# Patient Record
Sex: Female | Born: 1965 | Race: White | Hispanic: No | Smoking: Never smoker
Health system: Southern US, Community
[De-identification: ages and names within clinical notes are randomized; demographics above are authoritative.]

## PROBLEM LIST (undated history)

## (undated) DIAGNOSIS — I10 Essential (primary) hypertension: Secondary | ICD-10-CM

## (undated) DIAGNOSIS — G43909 Migraine, unspecified, not intractable, without status migrainosus: Secondary | ICD-10-CM

## (undated) DIAGNOSIS — I509 Heart failure, unspecified: Secondary | ICD-10-CM

## (undated) DIAGNOSIS — I639 Cerebral infarction, unspecified: Secondary | ICD-10-CM

## (undated) HISTORY — PX: WISDOM TOOTH EXTRACTION: SHX21

## (undated) HISTORY — DX: Cerebral infarction, unspecified: I63.9

---

## 2001-03-06 ENCOUNTER — Other Ambulatory Visit: Admission: RE | Admit: 2001-03-06 | Discharge: 2001-03-06 | Payer: Self-pay | Admitting: *Deleted

## 2002-10-05 ENCOUNTER — Emergency Department (HOSPITAL_COMMUNITY): Admission: EM | Admit: 2002-10-05 | Discharge: 2002-10-05 | Payer: Self-pay | Admitting: Emergency Medicine

## 2002-10-05 ENCOUNTER — Encounter: Payer: Self-pay | Admitting: Emergency Medicine

## 2012-01-07 ENCOUNTER — Emergency Department (HOSPITAL_BASED_OUTPATIENT_CLINIC_OR_DEPARTMENT_OTHER)
Admission: EM | Admit: 2012-01-07 | Discharge: 2012-01-07 | Disposition: A | Discharge status: Home / Self Care | Payer: BC Managed Care – PPO | Attending: Emergency Medicine | Admitting: Emergency Medicine

## 2012-01-07 ENCOUNTER — Encounter (HOSPITAL_BASED_OUTPATIENT_CLINIC_OR_DEPARTMENT_OTHER): Payer: Self-pay | Admitting: *Deleted

## 2012-01-07 DIAGNOSIS — R51 Headache: Secondary | ICD-10-CM | POA: Insufficient documentation

## 2012-01-07 HISTORY — DX: Migraine, unspecified, not intractable, without status migrainosus: G43.909

## 2012-01-07 HISTORY — DX: Essential (primary) hypertension: I10

## 2012-01-07 MED ORDER — ONDANSETRON 8 MG PO TBDP
8.0000 mg | ORAL_TABLET | Freq: Once | ORAL | Status: AC
  Administered 2012-01-07: 8 mg via ORAL
  Filled 2012-01-07: qty 1

## 2012-01-07 MED ORDER — KETOROLAC TROMETHAMINE 30 MG/ML IJ SOLN
30.0000 mg | Freq: Once | INTRAMUSCULAR | Status: AC
  Administered 2012-01-07: 30 mg via INTRAMUSCULAR
  Filled 2012-01-07: qty 1

## 2012-01-07 MED ORDER — IBUPROFEN 600 MG PO TABS: 600.0000 mg | ORAL_TABLET | Freq: Four times a day (QID) | ORAL | Status: AC | PRN

## 2012-01-07 MED ORDER — HYDROCODONE-ACETAMINOPHEN 5-500 MG PO TABS: 1.0000 | ORAL_TABLET | Freq: Four times a day (QID) | ORAL | Status: AC | PRN

## 2012-01-07 NOTE — ED Notes (Signed)
Pt reports developing a headache around 2pm yesterday afternoon. States headache turned into a migraine late last night. States that she has a history of migraines but has not had one for over a year. States that headache feels like a "clamp around her head". States that she took Excedrin Migraine medicine approximately 4 hours ago without relief. Pt reports nausea but no vomiting.

## 2012-01-07 NOTE — ED Provider Notes (Signed)
History     CSN: [id number]  Arrival date & time 01/07/12  0422   First MD Initiated Contact with Patient 01/07/12 [phone]      Chief Complaint  Patient presents with  . Migraine  . Nausea    (Consider location/radiation/quality/duration/timing/severity/associated sxs/prior treatment) Patient is a 46 y.o. female presenting with migraine. The history is provided by the patient.  Migraine Associated symptoms include headaches. Pertinent negatives include no chest pain, no abdominal pain and no shortness of breath.  pt states gradual onset 'migraine' headache yesterday. Onset at rest, awake, not straining or exerting. Mild at onset. Dull. Slowly worse and now states more throbbing, bilateral. Denies acute or abrupt worsening since onset. No neck pain or stiffness. Mild photophobia. No eye pain or change in vision. Mild nausea. No vomiting. Feels same as prior migraine. Not a 'worst' headache. Denies allergies or sinus symptoms. No uri c/o. No fever or chills. No recent fall, head injury or fainting. Denies any problems w balance or coordination. No numbness/weakness.   History reviewed. No pertinent past medical history.  History reviewed. No pertinent past surgical history.  No family history on file.  History  Substance Use Topics  . Smoking status: Not on file  . Smokeless tobacco: Not on file  . Alcohol Use: Not on file    OB History    Grav Para Term Preterm Abortions TAB SAB Ect Mult Living                  Review of Systems  Constitutional: Negative for fever and chills.  HENT: Negative for neck pain and neck stiffness.   Eyes: Negative for pain, redness and visual disturbance.  Respiratory: Negative for shortness of breath.   Cardiovascular: Negative for chest pain.  Gastrointestinal: Negative for vomiting and abdominal pain.  Genitourinary: Negative for flank pain.  Musculoskeletal: Negative for back pain.  Skin: Negative for rash.  Neurological: Positive for  headaches. Negative for syncope, weakness, light-headedness and numbness.  Hematological: Does not bruise/bleed easily.  Psychiatric/Behavioral: Negative for confusion.    Allergies  Review of patient's allergies indicates not on file.  Home Medications  No current outpatient prescriptions on file.  BP 151/107  Pulse 110  Temp(Src) 98.1 F (36.7 C) (Oral)  Resp 16  SpO2 99%  Physical Exam  Nursing note and vitals reviewed. Constitutional: She is oriented to person, place, and time. She appears well-developed and well-nourished. No distress.  HENT:  Head: Atraumatic.  Nose: Nose normal.  Mouth/Throat: Oropharynx is clear and moist.       No sinus or temporal tenderness  Eyes: Conjunctivae and EOM are normal. Pupils are equal, round, and reactive to light. No scleral icterus.       Sharp discs, no pe noted.   Neck: Normal range of motion. Neck supple. No tracheal deviation present.       No stiffness or rigidity.   Cardiovascular: Normal rate, regular rhythm, normal heart sounds and intact distal pulses.  Exam reveals no friction rub.   No murmur heard. Pulmonary/Chest: Effort normal and breath sounds normal. No respiratory distress.  Abdominal: Soft. Normal appearance and bowel sounds are normal. She exhibits no distension. There is no tenderness.  Musculoskeletal: She exhibits no edema.  Neurological: She is alert and oriented to person, place, and time. No cranial nerve deficit. Coordination normal.       Motor intact bil. Steady gait.   Skin: Skin is warm and dry. No rash noted.  Psychiatric: She  has a normal mood and affect.    ED Course  Procedures (including critical care time)    MDM  Pt states does not want to take imitrex, states tried in past without succuss and made chest feel funny. Pt drove self to ed. toradol im, zofran po.   Recheck appears comfortable, requests pain med rx for home.         Suzi Roots, MD 01/07/12 [phone]

## 2012-01-07 NOTE — ED Notes (Signed)
Pt has a history of migraines. Has not had one in over a year. States headache started around 2pm yesterday afternoon and got progressively worse last night. States that she has some nausea with the headache, but she has not vomited. States she took Excedrin migraine medicine approximately 4 hrs ago with relief.

## 2012-01-07 NOTE — Discharge Instructions (Signed)
Rest. Take motrin as need for pain. You may also take vicodin as need for pain. No driving when taking vicodin. Also, do not take tylenol or acetaminophen containing medication when taking vicodin. Follow up with primary care doctor in the next 1-2 days for recheck if symptoms fail to improve/resolve. Also, your blood pressure is high today - follow up with primary care doctor in coming week.  Return to ER if worse, severe head pain, vomiting, fevers, other concern.       Headache, General, Unknown Cause The specific cause of your headache may not have been found today. There are many causes and types of headache. A few common ones are:  Tension headache.   Migraine.   Infections (examples: dental and sinus infections).   Bone and/or joint problems in the neck or jaw.   Depression.   Eye problems.  These headaches are not life threatening.  Headaches can sometimes be diagnosed by a patient history and a physical exam. Sometimes, lab and imaging studies (such as x-ray and/or CT scan) are used to rule out more serious problems. In some cases, a spinal tap (lumbar puncture) may be requested. There are many times when your exam and tests may be normal on the first visit even when there is a serious problem causing your headaches. Because of that, it is very important to follow up with your doctor or local clinic for further evaluation. FINDING OUT THE RESULTS OF TESTS  If a radiology test was performed, a radiologist will review your results.   You will be contacted by the emergency department or your physician if any test results require a change in your treatment plan.   Not all test results may be available during your visit. If your test results are not back during the visit, make an appointment with your caregiver to find out the results. Do not assume everything is normal if you have not heard from your caregiver or the medical facility. It is important for you to follow up on all of  your test results.  HOME CARE INSTRUCTIONS   Keep follow-up appointments with your caregiver, or any specialist referral.   Only take over-the-counter or prescription medicines for pain, discomfort, or fever as directed by your caregiver.   Biofeedback, massage, or other relaxation techniques may be helpful.   Ice packs or heat applied to the head and neck can be used. Do this three to four times per day, or as needed.   Call your doctor if you have any questions or concerns.   If you smoke, you should quit.  SEEK MEDICAL CARE IF:   You develop problems with medications prescribed.   You do not respond to or obtain relief from medications.   You have a change from the usual headache.   You develop nausea or vomiting.  SEEK IMMEDIATE MEDICAL CARE IF:   If your headache becomes severe.   You have an unexplained oral temperature above 102 F (38.9 C), or as your caregiver suggests.   You have a stiff neck.   You have loss of vision.   You have muscular weakness.   You have loss of muscular control.   You develop severe symptoms different from your first symptoms.   You start losing your balance or have trouble walking.   You feel faint or pass out.  MAKE SURE YOU:   Understand these instructions.   Will watch your condition.   Will get help right away if you are  not doing well or get worse.  Document Released: 08/19/2005 Document Revised: 08/08/2011 Document Reviewed: 04/07/2008 [facility] Patient Information 2012 [place], [place].      Migraine Headache A migraine headache is an intense, throbbing pain on one or both sides of your head. The exact cause of a migraine headache is not always known. A migraine may be caused when nerves in the brain become irritated and release chemicals that cause swelling within blood vessels, causing pain. Many migraine sufferers have a family history of migraines. Before you get a migraine you may or may not get an aura. An aura is  a group of symptoms that can predict the beginning of a migraine. An aura may include:  Visual changes such as:   Flashing lights.   Bright spots or zig-zag lines.   Tunnel vision.   Feelings of numbness.   Trouble talking.   Muscle weakness.  SYMPTOMS  Pain on one or both sides of your head.   Pain that is pulsating or throbbing in nature.   Pain that is severe enough to prevent daily activities.   Pain that is aggravated by any daily physical activity.   Nausea (feeling sick to your stomach), vomiting, or both.   Pain with exposure to bright lights, loud noises, or activity.   General sensitivity to bright lights or loud noises.  MIGRAINE TRIGGERS Examples of triggers of migraine headaches include:   Alcohol.   Smoking.   Stress.   It may be related to menses (female menstruation).   Aged cheeses.   Foods or drinks that contain nitrates, glutamate, aspartame, or tyramine.   Lack of sleep.   Chocolate.   Caffeine.   Hunger.   Medications such as nitroglycerine (used to treat chest pain), birth control pills, estrogen, and some blood pressure medications.  DIAGNOSIS  A migraine headache is often diagnosed based on:  Symptoms.   Physical examination.   A computerized X-ray scan (computed tomography, CT) of your head.  TREATMENT  Medications can help prevent migraines if they are recurrent or should they become recurrent. Your caregiver can help you with a medication or treatment program that will be helpful to you.   Lying down in a dark, quiet room may be helpful.   Keeping a headache diary may help you find a trend as to what may be triggering your headaches.  SEEK IMMEDIATE MEDICAL CARE IF:   You have confusion, personality changes or seizures.   You have headaches that wake you from sleep.   You have an increased frequency in your headaches.   You have a stiff neck.   You have a loss of vision.   You have muscle weakness.   You  start losing your balance or have trouble walking.   You feel faint or pass out.  MAKE SURE YOU:   Understand these instructions.   Will watch your condition.   Will get help right away if you are not doing well or get worse.  Document Released: 08/19/2005 Document Revised: 08/08/2011 Document Reviewed: 04/04/2009 [facility] Patient Information 2012 [place], [place].       Hypertension As your heart beats, it forces blood through your arteries. This force is your blood pressure. If the pressure is too high, it is called hypertension (HTN) or high blood pressure. HTN is dangerous because you may have it and not know it. High blood pressure may mean that your heart has to work harder to pump blood. Your arteries may be narrow or stiff.  The extra work puts you at risk for heart disease, stroke, and other problems.  Blood pressure consists of two numbers, a higher number over a lower, 110/72, for example. It is stated as "110 over 72." The ideal is below 120 for the top number (systolic) and under 80 for the bottom (diastolic). Write down your blood pressure today. You should pay close attention to your blood pressure if you have certain conditions such as:  Heart failure.   Prior heart attack.   Diabetes   Chronic kidney disease.   Prior stroke.   Multiple risk factors for heart disease.  To see if you have HTN, your blood pressure should be measured while you are seated with your arm held at the level of the heart. It should be measured at least twice. A one-time elevated blood pressure reading (especially in the Emergency Department) does not mean that you need treatment. There may be conditions in which the blood pressure is different between your right and left arms. It is important to see your caregiver soon for a recheck. Most people have essential hypertension which means that there is not a specific cause. This type of high blood pressure may be lowered by changing lifestyle  factors such as:  Stress.   Smoking.   Lack of exercise.   Excessive weight.   Drug/tobacco/alcohol use.   Eating less salt.  Most people do not have symptoms from high blood pressure until it has caused damage to the body. Effective treatment can often prevent, delay or reduce that damage. TREATMENT  When a cause has been identified, treatment for high blood pressure is directed at the cause. There are a large number of medications to treat HTN. These fall into several categories, and your caregiver will help you select the medicines that are best for you. Medications may have side effects. You should review side effects with your caregiver. If your blood pressure stays high after you have made lifestyle changes or started on medicines,   Your medication(s) may need to be changed.   Other problems may need to be addressed.   Be certain you understand your prescriptions, and know how and when to take your medicine.   Be sure to follow up with your caregiver within the time frame advised (usually within two weeks) to have your blood pressure rechecked and to review your medications.   If you are taking more than one medicine to lower your blood pressure, make sure you know how and at what times they should be taken. Taking two medicines at the same time can result in blood pressure that is too low.  SEEK IMMEDIATE MEDICAL CARE IF:  You develop a severe headache, blurred or changing vision, or confusion.   You have unusual weakness or numbness, or a faint feeling.   You have severe chest or abdominal pain, vomiting, or breathing problems.  MAKE SURE YOU:   Understand these instructions.   Will watch your condition.   Will get help right away if you are not doing well or get worse.  Document Released: 08/19/2005 Document Revised: 08/08/2011 Document Reviewed: 04/08/2008 [facility] Patient Information 2012 [place], [place].

## 2012-06-15 ENCOUNTER — Inpatient Hospital Stay (HOSPITAL_BASED_OUTPATIENT_CLINIC_OR_DEPARTMENT_OTHER)
Admission: EM | Admit: 2012-06-15 | Discharge: 2012-06-19 | DRG: 544 | Disposition: A | Discharge status: Home / Self Care | Payer: BC Managed Care – PPO | Attending: Internal Medicine | Admitting: Internal Medicine

## 2012-06-15 ENCOUNTER — Emergency Department (HOSPITAL_BASED_OUTPATIENT_CLINIC_OR_DEPARTMENT_OTHER): Payer: BC Managed Care – PPO

## 2012-06-15 ENCOUNTER — Encounter (HOSPITAL_BASED_OUTPATIENT_CLINIC_OR_DEPARTMENT_OTHER): Payer: Self-pay | Admitting: Emergency Medicine

## 2012-06-15 DIAGNOSIS — R7402 Elevation of levels of lactic acid dehydrogenase (LDH): Secondary | ICD-10-CM | POA: Diagnosis present

## 2012-06-15 DIAGNOSIS — I5021 Acute systolic (congestive) heart failure: Secondary | ICD-10-CM

## 2012-06-15 DIAGNOSIS — R570 Cardiogenic shock: Secondary | ICD-10-CM | POA: Diagnosis present

## 2012-06-15 DIAGNOSIS — M6281 Muscle weakness (generalized): Secondary | ICD-10-CM | POA: Diagnosis present

## 2012-06-15 DIAGNOSIS — E876 Hypokalemia: Secondary | ICD-10-CM | POA: Diagnosis present

## 2012-06-15 DIAGNOSIS — F411 Generalized anxiety disorder: Secondary | ICD-10-CM

## 2012-06-15 DIAGNOSIS — I272 Pulmonary hypertension, unspecified: Secondary | ICD-10-CM

## 2012-06-15 DIAGNOSIS — I119 Hypertensive heart disease without heart failure: Secondary | ICD-10-CM

## 2012-06-15 DIAGNOSIS — I5022 Chronic systolic (congestive) heart failure: Secondary | ICD-10-CM | POA: Diagnosis present

## 2012-06-15 DIAGNOSIS — J90 Pleural effusion, not elsewhere classified: Secondary | ICD-10-CM

## 2012-06-15 DIAGNOSIS — I079 Rheumatic tricuspid valve disease, unspecified: Secondary | ICD-10-CM | POA: Diagnosis present

## 2012-06-15 DIAGNOSIS — J9601 Acute respiratory failure with hypoxia: Secondary | ICD-10-CM

## 2012-06-15 DIAGNOSIS — I10 Essential (primary) hypertension: Secondary | ICD-10-CM | POA: Diagnosis present

## 2012-06-15 DIAGNOSIS — R791 Abnormal coagulation profile: Secondary | ICD-10-CM

## 2012-06-15 DIAGNOSIS — I5042 Chronic combined systolic (congestive) and diastolic (congestive) heart failure: Secondary | ICD-10-CM | POA: Diagnosis present

## 2012-06-15 DIAGNOSIS — R7989 Other specified abnormal findings of blood chemistry: Secondary | ICD-10-CM

## 2012-06-15 DIAGNOSIS — R0989 Other specified symptoms and signs involving the circulatory and respiratory systems: Secondary | ICD-10-CM

## 2012-06-15 DIAGNOSIS — I2789 Other specified pulmonary heart diseases: Secondary | ICD-10-CM | POA: Diagnosis present

## 2012-06-15 DIAGNOSIS — F419 Anxiety disorder, unspecified: Secondary | ICD-10-CM

## 2012-06-15 DIAGNOSIS — I5041 Acute combined systolic (congestive) and diastolic (congestive) heart failure: Principal | ICD-10-CM | POA: Diagnosis present

## 2012-06-15 DIAGNOSIS — I5031 Acute diastolic (congestive) heart failure: Secondary | ICD-10-CM

## 2012-06-15 DIAGNOSIS — K761 Chronic passive congestion of liver: Secondary | ICD-10-CM

## 2012-06-15 DIAGNOSIS — I428 Other cardiomyopathies: Secondary | ICD-10-CM

## 2012-06-15 DIAGNOSIS — R0602 Shortness of breath: Secondary | ICD-10-CM | POA: Diagnosis present

## 2012-06-15 DIAGNOSIS — I240 Acute coronary thrombosis not resulting in myocardial infarction: Secondary | ICD-10-CM

## 2012-06-15 DIAGNOSIS — E119 Type 2 diabetes mellitus without complications: Secondary | ICD-10-CM | POA: Diagnosis present

## 2012-06-15 DIAGNOSIS — I498 Other specified cardiac arrhythmias: Secondary | ICD-10-CM | POA: Diagnosis present

## 2012-06-15 DIAGNOSIS — D72829 Elevated white blood cell count, unspecified: Secondary | ICD-10-CM

## 2012-06-15 DIAGNOSIS — I5189 Other ill-defined heart diseases: Secondary | ICD-10-CM | POA: Diagnosis present

## 2012-06-15 DIAGNOSIS — R7401 Elevation of levels of liver transaminase levels: Secondary | ICD-10-CM | POA: Diagnosis present

## 2012-06-15 DIAGNOSIS — J9600 Acute respiratory failure, unspecified whether with hypoxia or hypercapnia: Secondary | ICD-10-CM | POA: Diagnosis present

## 2012-06-15 DIAGNOSIS — G43809 Other migraine, not intractable, without status migrainosus: Secondary | ICD-10-CM | POA: Diagnosis present

## 2012-06-15 DIAGNOSIS — R110 Nausea: Secondary | ICD-10-CM | POA: Diagnosis present

## 2012-06-15 DIAGNOSIS — R0609 Other forms of dyspnea: Secondary | ICD-10-CM

## 2012-06-15 DIAGNOSIS — E1165 Type 2 diabetes mellitus with hyperglycemia: Secondary | ICD-10-CM | POA: Diagnosis present

## 2012-06-15 DIAGNOSIS — I509 Heart failure, unspecified: Secondary | ICD-10-CM

## 2012-06-15 DIAGNOSIS — R0600 Dyspnea, unspecified: Secondary | ICD-10-CM

## 2012-06-15 HISTORY — DX: Anxiety disorder, unspecified: F41.9

## 2012-06-15 HISTORY — DX: Chronic systolic (congestive) heart failure: I50.22

## 2012-06-15 HISTORY — DX: Essential (primary) hypertension: I10

## 2012-06-15 HISTORY — DX: Shortness of breath: R06.02

## 2012-06-15 HISTORY — DX: Hypertensive heart disease without heart failure: I11.9

## 2012-06-15 LAB — COMPREHENSIVE METABOLIC PANEL
ALT: 639 U/L — ABNORMAL HIGH (ref 0–35)
Albumin: 2.8 g/dL — ABNORMAL LOW (ref 3.5–5.2)
Alkaline Phosphatase: 54 U/L (ref 39–117)
BUN: 23 mg/dL (ref 6–23)
Chloride: 96 mEq/L (ref 96–112)
Glucose, Bld: 225 mg/dL — ABNORMAL HIGH (ref 70–99)
Potassium: 4.8 mEq/L (ref 3.5–5.1)
Sodium: 135 mEq/L (ref 135–145)
Total Bilirubin: 1.1 mg/dL (ref 0.3–1.2)

## 2012-06-15 LAB — CBC WITH DIFFERENTIAL/PLATELET
Basophils Relative: 0 % (ref 0–1)
Hemoglobin: 14.3 g/dL (ref 12.0–15.0)
Lymphs Abs: 5.3 10*3/uL — ABNORMAL HIGH (ref 0.7–4.0)
Monocytes Relative: 7 % (ref 3–12)
Neutro Abs: 5.6 10*3/uL (ref 1.7–7.7)
Neutrophils Relative %: 47 % (ref 43–77)
Platelets: 211 10*3/uL (ref 150–400)
RBC: 4.85 MIL/uL (ref 3.87–5.11)
WBC: 11.8 10*3/uL — ABNORMAL HIGH (ref 4.0–10.5)

## 2012-06-15 LAB — APTT: aPTT: 28 seconds (ref 24–37)

## 2012-06-15 LAB — CBC
Platelets: 186 10*3/uL (ref 150–400)
RDW: 12.9 % (ref 11.5–15.5)
WBC: 14.3 10*3/uL — ABNORMAL HIGH (ref 4.0–10.5)

## 2012-06-15 LAB — CK TOTAL AND CKMB (NOT AT ARMC)
CK, MB: 2.7 ng/mL (ref 0.3–4.0)
Relative Index: INVALID (ref 0.0–2.5)
Total CK: 47 U/L (ref 7–177)

## 2012-06-15 LAB — PROTIME-INR: INR: 1.62 — ABNORMAL HIGH (ref 0.00–1.49)

## 2012-06-15 LAB — D-DIMER, QUANTITATIVE: D-Dimer, Quant: 11.3 ug/mL-FEU — ABNORMAL HIGH (ref 0.00–0.48)

## 2012-06-15 MED ORDER — SODIUM CHLORIDE 0.9 % IV SOLN: 250.0000 mL | INTRAVENOUS | Status: DC | PRN

## 2012-06-15 MED ORDER — DULOXETINE HCL 20 MG PO CPEP
20.0000 mg | ORAL_CAPSULE | Freq: Every day | ORAL | Status: DC
  Administered 2012-06-16 – 2012-06-19 (×4): 20 mg via ORAL
  Filled 2012-06-15 (×4): qty 1

## 2012-06-15 MED ORDER — ASPIRIN EC 81 MG PO TBEC
81.0000 mg | DELAYED_RELEASE_TABLET | Freq: Every day | ORAL | Status: DC
  Administered 2012-06-16 – 2012-06-17 (×2): 81 mg via ORAL
  Filled 2012-06-15 (×3): qty 1

## 2012-06-15 MED ORDER — FUROSEMIDE 10 MG/ML IJ SOLN
40.0000 mg | Freq: Once | INTRAMUSCULAR | Status: AC
  Administered 2012-06-15: 40 mg via INTRAVENOUS
  Filled 2012-06-15: qty 4

## 2012-06-15 MED ORDER — INSULIN ASPART 100 UNIT/ML ~~LOC~~ SOLN
0.0000 [IU] | Freq: Three times a day (TID) | SUBCUTANEOUS | Status: DC
  Administered 2012-06-16: 3 [IU] via SUBCUTANEOUS
  Administered 2012-06-16: 2 [IU] via SUBCUTANEOUS
  Administered 2012-06-17 (×2): 3 [IU] via SUBCUTANEOUS
  Administered 2012-06-18: 2 [IU] via SUBCUTANEOUS
  Administered 2012-06-18: 8 [IU] via SUBCUTANEOUS
  Administered 2012-06-18: 2 [IU] via SUBCUTANEOUS
  Administered 2012-06-19: 3 [IU] via SUBCUTANEOUS
  Administered 2012-06-19: 2 [IU] via SUBCUTANEOUS

## 2012-06-15 MED ORDER — FUROSEMIDE 10 MG/ML IJ SOLN
20.0000 mg | Freq: Two times a day (BID) | INTRAMUSCULAR | Status: DC
  Administered 2012-06-15: 20 mg via INTRAVENOUS
  Filled 2012-06-15 (×3): qty 2

## 2012-06-15 MED ORDER — SODIUM CHLORIDE 0.9 % IJ SOLN
3.0000 mL | Freq: Two times a day (BID) | INTRAMUSCULAR | Status: DC
  Administered 2012-06-16 – 2012-06-19 (×5): 3 mL via INTRAVENOUS

## 2012-06-15 MED ORDER — IOHEXOL 350 MG/ML SOLN
80.0000 mL | Freq: Once | INTRAVENOUS | Status: AC | PRN
  Administered 2012-06-15: 80 mL via INTRAVENOUS

## 2012-06-15 MED ORDER — ONDANSETRON HCL 4 MG/2ML IJ SOLN: 4.0000 mg | Freq: Four times a day (QID) | INTRAMUSCULAR | Status: DC | PRN

## 2012-06-15 MED ORDER — ENOXAPARIN SODIUM 40 MG/0.4ML ~~LOC~~ SOLN
40.0000 mg | SUBCUTANEOUS | Status: DC
  Administered 2012-06-16: 40 mg via SUBCUTANEOUS
  Filled 2012-06-15: qty 0.4

## 2012-06-15 MED ORDER — BUPROPION HCL 75 MG PO TABS
75.0000 mg | ORAL_TABLET | Freq: Every day | ORAL | Status: DC
  Administered 2012-06-16 – 2012-06-19 (×4): 75 mg via ORAL
  Filled 2012-06-15 (×4): qty 1

## 2012-06-15 MED ORDER — ACETAMINOPHEN 325 MG PO TABS: 650.0000 mg | ORAL_TABLET | ORAL | Status: DC | PRN

## 2012-06-15 MED ORDER — LORAZEPAM 0.5 MG PO TABS
1.0000 mg | ORAL_TABLET | Freq: Three times a day (TID) | ORAL | Status: DC | PRN
  Administered 2012-06-15 – 2012-06-18 (×4): 1 mg via ORAL
  Filled 2012-06-15 (×4): qty 2

## 2012-06-15 MED ORDER — LISINOPRIL 10 MG PO TABS
10.0000 mg | ORAL_TABLET | Freq: Every day | ORAL | Status: DC
  Administered 2012-06-16: 10 mg via ORAL
  Filled 2012-06-15 (×2): qty 1

## 2012-06-15 MED ORDER — SODIUM CHLORIDE 0.9 % IJ SOLN: 3.0000 mL | INTRAMUSCULAR | Status: DC | PRN

## 2012-06-15 NOTE — ED Notes (Signed)
Report to [facility] 

## 2012-06-15 NOTE — ED Provider Notes (Signed)
History  This chart was scribed for Margaret Lyons, MD by Erskine Emery. This patient was seen in room MH06/MH06 and the patient's care was started at 16:55.   CSN: [id number]  Arrival date & time 06/15/12  1635   First MD Initiated Contact with Patient 06/15/12 1655      Chief Complaint  Patient presents with  . Shortness of Breath  . Fatigue    (Consider location/radiation/quality/duration/timing/severity/associated sxs/prior treatment) The history is provided by the patient. No language interpreter was used.  Margaret Hess is a 46 y.o. female who presents to the Emergency Department complaining of gradually worsening muscle weakness, diaphoresis, fatigue, and SOB for the past couple weeks and  nausea, emesis (about 3 episodes), and nonproductive cough for the past 4-5 days. Pt denies any associated chest pain, dysuria, difficulty urinating, extremity swelling, any reent long trips, or known sick contacts. Pt reports she was diagnosed with DM about a year ago but other than that she is perfectly healthy at baseline. Pt saw her PCP about a week ago (October 7th) for the same symptoms; they did blood work and a chest x-ray with no concerning findings. The pt hasn't improved since then so her PCP suggested she come here for further examination. Pt has never had a stress test or been diagnosed with heart problems.   Dr. Burnett Kanaris at [place] is the pt's PCP.  Past Medical History  Diagnosis Date  . Migraines   . Hypertension   . Diabetes mellitus     History reviewed. No pertinent past surgical history.  No family history on file.  History  Substance Use Topics  . Smoking status: Never Smoker   . Smokeless tobacco: Never Used  . Alcohol Use: Yes     1 mixed drink every 6 months    OB History    Grav Para Term Preterm Abortions TAB SAB Ect Mult Living                  Review of Systems A complete 10 system review of systems was obtained and all systems are  negative except as noted in the HPI and PMH.    Allergies  Review of patient's allergies indicates no known allergies.  Home Medications   Current Outpatient Rx  Name Route Sig Dispense Refill  . BUPROPION HCL 75 MG PO TABS Oral Take 75 mg by mouth 2 (two) times daily.    . DULOXETINE HCL 20 MG PO CPEP Oral Take 20 mg by mouth daily.    Marland Kitchen VICTOZA [place] Subcutaneous Inject into the skin.    Marland Kitchen METFORMIN HCL 500 MG PO TABS Oral Take 500 mg by mouth 2 (two) times daily with a meal.    . ZOLPIDEM TARTRATE 10 MG PO TABS Oral Take 10 mg by mouth at bedtime as needed.      Triage vitals: BP 126/104  Pulse 120  Temp 97.9 F (36.6 C)  Resp 16  Ht 5\' 5"  (1.651 m)  Wt 179 lb (81.194 kg)  BMI 29.79 kg/m2  SpO2 100%  Physical Exam  Nursing note and vitals reviewed. Constitutional: She is oriented to person, place, and time. She appears well-developed and well-nourished. No distress.       Appears pale.  HENT:  Head: Normocephalic and atraumatic.  Eyes: EOM are normal. Pupils are equal, round, and reactive to light.  Neck: Neck supple. No tracheal deviation present.  Cardiovascular: Tachycardia present.   Pulmonary/Chest: Effort normal. No respiratory distress.  Abdominal:  Soft. She exhibits no distension.  Musculoskeletal: Normal range of motion. She exhibits no edema.       No calf tendernes. No edema in the extremities. Homan's sign is absent bilaterally.  Neurological: She is alert and oriented to person, place, and time.  Skin: Skin is warm and dry.  Psychiatric: She has a normal mood and affect.    ED Course  Procedures (including critical care time) DIAGNOSTIC STUDIES: Oxygen Saturation is 100% on [place], normal by my interpretation.    COORDINATION OF CARE: 17:00--I evaluated the patient and we discussed a treatment plan including chest CT and blood work to which the pt agreed. I reviewed the pt's previous labs and explained to her that thew show no concerning indications.    18:56--Consult with [facility] hospitalist.   Results for orders placed during the hospital encounter of 06/15/12  CBC WITH DIFFERENTIAL      Component Value Range   WBC 11.8 (*) 4.0 - 10.5 K/uL   RBC 4.85  3.87 - 5.11 MIL/uL   Hemoglobin 14.3  12.0 - 15.0 g/dL   HCT 45.4  09.8 - 11.9 %   MCV 86.4  78.0 - 100.0 fL   MCH 29.5  26.0 - 34.0 pg   MCHC 34.1  30.0 - 36.0 g/dL   RDW 14.7  82.9 - 56.2 %   Platelets 211  150 - 400 K/uL   Neutrophils Relative 47  43 - 77 %   Neutro Abs 5.6  1.7 - 7.7 K/uL   Lymphocytes Relative 45  12 - 46 %   Lymphs Abs 5.3 (*) 0.7 - 4.0 K/uL   Monocytes Relative 7  3 - 12 %   Monocytes Absolute 0.9  0.1 - 1.0 K/uL   Eosinophils Relative 0  0 - 5 %   Eosinophils Absolute 0.0  0.0 - 0.7 K/uL   Basophils Relative 0  0 - 1 %   Basophils Absolute 0.0  0.0 - 0.1 K/uL  COMPREHENSIVE METABOLIC PANEL      Component Value Range   Sodium 135  135 - 145 mEq/L   Potassium 4.8  3.5 - 5.1 mEq/L   Chloride 96  96 - 112 mEq/L   CO2 19  19 - 32 mEq/L   Glucose, Bld 225 (*) 70 - 99 mg/dL   BUN 23  6 - 23 mg/dL   Creatinine, Ser 1.30  0.50 - 1.10 mg/dL   Calcium 9.1  8.4 - 86.5 mg/dL   Total Protein 6.7  6.0 - 8.3 g/dL   Albumin 2.8 (*) 3.5 - 5.2 g/dL   AST 784 (*) 0 - 37 U/L   ALT 639 (*) 0 - 35 U/L   Alkaline Phosphatase 54  39 - 117 U/L   Total Bilirubin 1.1  0.3 - 1.2 mg/dL   GFR calc non Af Amer >90  >90 mL/min   GFR calc Af Amer >90  >90 mL/min  D-DIMER, QUANTITATIVE      Component Value Range   D-Dimer, Quant 11.30 (*) 0.00 - 0.48 ug/mL-FEU  PRO B NATRIURETIC PEPTIDE      Component Value Range   Pro B Natriuretic peptide (BNP) 6932.0 (*) 0 - 125 pg/mL  PROTIME-INR      Component Value Range   Prothrombin Time 18.7 (*) 11.6 - 15.2 seconds   INR 1.62 (*) 0.00 - 1.49  APTT      Component Value Range   aPTT 28  24 - 37 seconds  Ct Angio Chest Pe W/cm &/or Wo Cm  06/15/2012  *RADIOLOGY REPORT*  Clinical Data: Cardia.  Dyspnea.  Elevated D-dimer.  CT  ANGIOGRAPHY CHEST  Technique:  Multidetector CT imaging of the chest using the standard protocol during bolus administration of intravenous contrast. Multiplanar reconstructed images including MIPs were obtained and reviewed to evaluate the vascular anatomy.  Contrast: 80mL OMNIPAQUE IOHEXOL 350 MG/ML SOLN  Comparison: None.  Findings: There are no pulmonary emboli.  There is slight cardiomegaly.  No pulmonary edema. The lungs are clear.  There are moderate bilateral pleural effusions.  No osseous abnormality.  No visible coronary artery calcifications.  No significant adenopathy.  IMPRESSION: Slight cardiomegaly with moderate bilateral pleural effusions.  No pulmonary emboli.   Original Report Authenticated By: Gwynn Burly, M.D.         No diagnosis found.   Date: 06/15/2012  Rate: 123  Rhythm: sinus tachycardia  QRS Axis: normal  Intervals: normal  ST/T Wave abnormalities: nonspecific T wave changes  Conduction Disutrbances:none  Narrative Interpretation:   Old EKG Reviewed: none available    MDM  The patient presents here complaining of doe for the past 2 weeks which has been getting worse.  The workup today revealed an elevated D-Dimer and bnp.  This combined with tachycardia raised my suspicion for pulmonary embolus.  A ct was performed and was negative for pe, however did reveal bilateral pulmonary effusions.  CHF is a possibility, however there was no mention of increased pulmonary vascularity.  The etiology of the effusions are unknown to me and as her vital signs (HR 120's) are abnormal, I believe she requires an inpatient workup into this.  I have spoken with Dr. Susie Cassette who is on for [facility].  She agrees to admit the patient to the medicine service.        I personally performed the services described in this documentation, which was scribed in my presence. The recorded information has been reviewed and considered.      Margaret Lyons, MD 06/15/12 1910

## 2012-06-15 NOTE — ED Notes (Signed)
Via [facility]--spoke with Margaret Hess to call hospitalist at [facility]

## 2012-06-15 NOTE — ED Notes (Signed)
Reports SOB, and fatigue for past couple of weeks.  Was seen by PMD when it first started and had labs and EKG.  At that time everything was normal.  She reports she has continued to get worse.

## 2012-06-15 NOTE — H&P (Signed)
[facility] History and Physical  Margaret Hess:[id number] DOB: 1966/08/01    PCP:   Cala Bradford, MD   Chief Complaint: Shortness of breath, orthopnea and PND for 2 weeks.  HPI: Margaret Hess is an 46 y.o. 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  She presents to the [facility] with shortness of breath.  She denied ever had any chest pain, leg swelling, calf tenderness, Hx of known HTN, viral syndrome, recent distant travel, or any ill contact.  She uses no alcohol and denied drug use.  She doesn't smoke.  Evaluation in the ER showed a D-dimer of 11, with CTPA negative for PE,but with bilateral pleural effusions, Pro BNP 6439, INR of 1.62. LFTs with ALT 639, and AST 522, WBC 11.8, Hb 14.8.  EKG showed ST without any evidence of LVH or ischemia.  She was given 40mg  IV Lasix and diuresed 1300cc and felt markedly better.  Rewiew of Systems:  Constitutional: Negative for malaise, fever and chills. No significant weight loss or weight gain Eyes: Negative for eye pain, redness and discharge, diplopia, visual changes, or flashes of light. ENMT: Negative for ear pain, hoarseness, nasal congestion, sinus pressure and sore throat. No headaches; tinnitus, drooling, or problem swallowing. Cardiovascular: Negative for chest pain, palpitations, diaphoresis, and peripheral edema. ;  Respiratory: Negative for cough, hemoptysis, wheezing and stridor. No pleuritic chestpain. Gastrointestinal: Negative for nausea, vomiting, diarrhea, constipation, abdominal pain, melena, blood in stool, hematemesis, jaundice and rectal bleeding.    Genitourinary: Negative for frequency, dysuria, incontinence,flank pain and hematuria; Musculoskeletal: Negative for back pain and neck pain. Negative for swelling and trauma.;  Skin: . Negative for pruritus, rash,  abrasions, bruising and skin lesion.; ulcerations Neuro: Negative for headache, lightheadedness and neck stiffness. Negative for weakness, altered level of consciousness , altered mental status, extremity weakness, burning feet, involuntary movement, seizure and syncope.  Psych: negative for  depression, insomnia, tearfulness, panic attacks, hallucinations, paranoia, suicidal or homicidal ideation    Past Medical History  Diagnosis Date  . Migraines   . Hypertension   . Diabetes mellitus     History reviewed. No pertinent past surgical history.  Medications:  HOME MEDS: Prior to Admission medications   Medication Sig Start Date End Date Taking? Authorizing Provider  buPROPion (WELLBUTRIN) 75 MG tablet Take 75 mg by mouth daily.    Yes Historical Provider, MD  DULoxetine (CYMBALTA) 20 MG capsule Take 20 mg by mouth daily.   Yes Historical Provider, MD  Liraglutide (VICTOZA [place]) Inject 1.2 mLs into the skin daily.    Yes Historical Provider, MD  metFORMIN (GLUCOPHAGE) 500 MG tablet Take 500 mg by mouth 2 (two) times daily with a meal.   Yes Historical Provider, MD  zolpidem (AMBIEN) 10 MG tablet Take 10 mg by mouth at bedtime as needed. For sleep   Yes Historical Provider, MD     Allergies:  No Known Allergies  Social History:   reports that she has never smoked. She has never used smokeless tobacco. She reports that she drinks alcohol. She reports that she does not use illicit drugs.  Family History: No family hx of premature CAD, sudden death, Cardiomyopathy.   Physical Exam: Filed Vitals:   06/15/12 2058 06/15/12 2115 06/15/12 2130 06/15/12 2215  BP: 127/103 130/99 130/99 129/100  Pulse: 118 120 117 118  Temp: 97.3 F (36.3 C)     TempSrc: Oral     Resp: 22 20 17 25   Height: 5\' 5"  (1.651 m)     Weight: 82.1 kg (181 lb)     SpO2: 99% 100% 100% 100%   Blood pressure 129/100, pulse 118, temperature 97.3 F (36.3 C), temperature source Oral, resp. rate 25, height 5\' 5"   (1.651 m), weight 82.1 kg (181 lb), SpO2 100.00%.  GEN:  Pleasant patient lying in the stretcher in no acute distress; cooperative with exam. PSYCH:  alert and oriented x4; does not appear anxious or depressed; affect is appropriate. HEENT: Mucous membranes pink and anicteric; PERRLA; EOM intact; no cervical lymphadenopathy nor thyromegaly or carotid bruit; no JVD; There were no stridor. Neck is very supple. Breasts:: Not examined CHEST WALL: No tenderness CHEST: Normal respiration, clear to auscultation bilaterally.  HEART: Regular rate and rhythm.  There are no murmur, rub, or gallops.   BACK: No kyphosis or scoliosis; no CVA tenderness ABDOMEN: soft and non-tender; no masses, no organomegaly, normal abdominal bowel sounds; no pannus; no intertriginous candida. There is no rebound and no distention. Rectal Exam: Not done EXTREMITIES: No bone or joint deformity; age-appropriate arthropathy of the hands and knees; no edema; no ulcerations.  There is no calf tenderness. Genitalia: not examined PULSES: 2+ and symmetric SKIN: Normal hydration no rash or ulceration CNS: Cranial nerves 2-12 grossly intact no focal lateralizing neurologic deficit.  Speech is fluent; uvula elevated with phonation, facial symmetry and tongue midline. DTR are normal bilaterally, cerebella exam is intact, barbinski is negative and strengths are equaled bilaterally.  No sensory loss.   Labs on Admission:  Basic Metabolic Panel:  Lab 06/15/12 2841  NA 135  K 4.8  CL 96  CO2 19  GLUCOSE 225*  BUN 23  CREATININE 0.70  CALCIUM 9.1  MG --  PHOS --   Liver Function Tests:  Lab 06/15/12 1720  AST 522*  ALT 639*  ALKPHOS 54  BILITOT 1.1  PROT 6.7  ALBUMIN 2.8*   No results found for this basename: LIPASE:5,AMYLASE:5 in the last 168 hours No results found for this basename: AMMONIA:5 in the last 168 hours CBC:  Lab 06/15/12 1720  WBC 11.8*  NEUTROABS 5.6  HGB 14.3  HCT 41.9  MCV 86.4  PLT 211    Cardiac Enzymes:  Lab 06/15/12 1929  CKTOTAL 47  CKMB 2.7  CKMBINDEX --  TROPONINI <0.30    CBG: No results found for this basename: GLUCAP:5 in the last 168 hours   Radiological Exams on Admission: Ct Angio Chest Pe W/cm &/or Wo Cm  06/15/2012  *RADIOLOGY REPORT*  Clinical Data: Cardia.  Dyspnea.  Elevated D-dimer.  CT ANGIOGRAPHY CHEST  Technique:  Multidetector CT imaging of the chest using the standard protocol during bolus administration of intravenous contrast. Multiplanar reconstructed images including MIPs were obtained and reviewed to evaluate the vascular anatomy.  Contrast: 80mL OMNIPAQUE IOHEXOL 350 MG/ML SOLN  Comparison: None.  Findings: There are no pulmonary emboli.  There is slight cardiomegaly.  No pulmonary edema. The lungs are clear.  There are moderate bilateral pleural effusions.  No osseous abnormality.  No visible coronary artery calcifications.  No significant adenopathy.  IMPRESSION: Slight cardiomegaly with moderate bilateral pleural effusions.  No pulmonary emboli.   Original Report Authenticated By: Gwynn Burly, M.D.     EKG: Independently reviewed. ST with no LVH or ischemia   Assessment/Plan Present on Admission:  .CHF (congestive heart failure) .  HTN (hypertension) .Anxiety Tachycardia Elevated LFTs.  PLAN:  She does have evidence of CHF with orthopnea, PND, and elevated ProBNP.  I think her LFTs reflect hepatic congestion, but I am not certain.  She is doing better with IV diuresis.  She will need an ECHO to evaluate EF and any wall motion abnormality.  She could have had a silent MI causing CHF.  She did not have any viral syndrome nor is she an alcoholic to explain for viral cardiomyopathy or alcoholic cardiomyopathy. Her diastolic BP is a bit high, so she could have HTN CMP.   Please consult cardiology for further recommendation as to her further work up.  She is a bit tachycardic, and I suspect that it is from her anxiety.  I have started her  on low dose ace inhibitor, and have held her Metformin for now.  She is stable, full code, and will be admitted for SDU under [facility] service.  Other plans as per orders.  Code Status: FULL Unk Lightning, MD. [facility] Pager [phone] 7pm to 7am.  06/15/2012, 10:54 PM

## 2012-06-16 ENCOUNTER — Encounter (HOSPITAL_COMMUNITY)
Admission: EM | Disposition: A | Discharge status: Home / Self Care | Payer: Self-pay | Source: Home / Self Care | Attending: Internal Medicine

## 2012-06-16 DIAGNOSIS — J9600 Acute respiratory failure, unspecified whether with hypoxia or hypercapnia: Secondary | ICD-10-CM

## 2012-06-16 DIAGNOSIS — E1165 Type 2 diabetes mellitus with hyperglycemia: Secondary | ICD-10-CM | POA: Diagnosis present

## 2012-06-16 DIAGNOSIS — I5042 Chronic combined systolic (congestive) and diastolic (congestive) heart failure: Secondary | ICD-10-CM | POA: Diagnosis present

## 2012-06-16 DIAGNOSIS — R7989 Other specified abnormal findings of blood chemistry: Secondary | ICD-10-CM | POA: Diagnosis present

## 2012-06-16 DIAGNOSIS — I5043 Acute on chronic combined systolic (congestive) and diastolic (congestive) heart failure: Secondary | ICD-10-CM

## 2012-06-16 DIAGNOSIS — I272 Pulmonary hypertension, unspecified: Secondary | ICD-10-CM | POA: Diagnosis present

## 2012-06-16 DIAGNOSIS — J9601 Acute respiratory failure with hypoxia: Secondary | ICD-10-CM | POA: Diagnosis present

## 2012-06-16 DIAGNOSIS — E119 Type 2 diabetes mellitus without complications: Secondary | ICD-10-CM | POA: Diagnosis present

## 2012-06-16 DIAGNOSIS — R791 Abnormal coagulation profile: Secondary | ICD-10-CM

## 2012-06-16 DIAGNOSIS — I509 Heart failure, unspecified: Secondary | ICD-10-CM

## 2012-06-16 DIAGNOSIS — I513 Intracardiac thrombosis, not elsewhere classified: Secondary | ICD-10-CM

## 2012-06-16 HISTORY — DX: Type 2 diabetes mellitus without complications: E11.9

## 2012-06-16 HISTORY — DX: Pulmonary hypertension, unspecified: I27.20

## 2012-06-16 HISTORY — PX: LEFT AND RIGHT HEART CATHETERIZATION WITH CORONARY ANGIOGRAM: SHX5449

## 2012-06-16 HISTORY — DX: Intracardiac thrombosis, not elsewhere classified: I51.3

## 2012-06-16 HISTORY — DX: Chronic combined systolic (congestive) and diastolic (congestive) heart failure: I50.42

## 2012-06-16 HISTORY — DX: Other specified abnormal findings of blood chemistry: R79.89

## 2012-06-16 HISTORY — DX: Type 2 diabetes mellitus with hyperglycemia: E11.65

## 2012-06-16 HISTORY — DX: Acute respiratory failure with hypoxia: J96.01

## 2012-06-16 LAB — POCT I-STAT 3, VENOUS BLOOD GAS (G3P V)
Acid-Base Excess: 1 mmol/L (ref 0.0–2.0)
Bicarbonate: 25.8 mEq/L — ABNORMAL HIGH (ref 20.0–24.0)
pCO2, Ven: 41.8 mmHg — ABNORMAL LOW (ref 45.0–50.0)
pH, Ven: 7.407 — ABNORMAL HIGH (ref 7.250–7.300)
pH, Ven: 7.421 — ABNORMAL HIGH (ref 7.250–7.300)
pO2, Ven: 34 mmHg (ref 30.0–45.0)

## 2012-06-16 LAB — POCT I-STAT 3, ART BLOOD GAS (G3+)
Acid-Base Excess: 5 mmol/L — ABNORMAL HIGH (ref 0.0–2.0)
Bicarbonate: 26.6 mEq/L — ABNORMAL HIGH (ref 20.0–24.0)
TCO2: 27 mmol/L (ref 0–100)
pH, Arterial: 7.569 — ABNORMAL HIGH (ref 7.350–7.450)

## 2012-06-16 LAB — BASIC METABOLIC PANEL
CO2: 27 mEq/L (ref 19–32)
Chloride: 97 mEq/L (ref 96–112)
Sodium: 135 mEq/L (ref 135–145)

## 2012-06-16 LAB — GLUCOSE, CAPILLARY
Glucose-Capillary: 139 mg/dL — ABNORMAL HIGH (ref 70–99)
Glucose-Capillary: 155 mg/dL — ABNORMAL HIGH (ref 70–99)

## 2012-06-16 LAB — POCT ACTIVATED CLOTTING TIME: Activated Clotting Time: 100 seconds

## 2012-06-16 LAB — TSH: TSH: 0.865 u[IU]/mL (ref 0.350–4.500)

## 2012-06-16 LAB — TROPONIN I
Troponin I: <0.3 ng/mL (ref <0.30)
Troponin I: <0.3 ng/mL (ref <0.30)
Troponin I: <0.3 ng/mL (ref <0.30)

## 2012-06-16 LAB — CREATININE, SERUM
Creatinine, Ser: 0.6 mg/dL (ref 0.50–1.10)
GFR calc Af Amer: >90 mL/min (ref >90)
GFR calc non Af Amer: >90 mL/min (ref >90)

## 2012-06-16 SURGERY — LEFT AND RIGHT HEART CATHETERIZATION WITH CORONARY ANGIOGRAM
Anesthesia: LOCAL

## 2012-06-16 MED ORDER — WARFARIN SODIUM 7.5 MG PO TABS
7.5000 mg | ORAL_TABLET | ORAL | Status: AC
  Administered 2012-06-16: 7.5 mg via ORAL
  Filled 2012-06-16: qty 1

## 2012-06-16 MED ORDER — COUMADIN BOOK
Freq: Once | Status: AC
  Administered 2012-06-16: 20:00:00
  Filled 2012-06-16: qty 1

## 2012-06-16 MED ORDER — POTASSIUM CHLORIDE CRYS ER 20 MEQ PO TBCR
EXTENDED_RELEASE_TABLET | ORAL | Status: AC
  Filled 2012-06-16: qty 1

## 2012-06-16 MED ORDER — DIAZEPAM 5 MG PO TABS
5.0000 mg | ORAL_TABLET | ORAL | Status: AC
  Administered 2012-06-16: 5 mg via ORAL
  Filled 2012-06-16: qty 1

## 2012-06-16 MED ORDER — POTASSIUM CHLORIDE CRYS ER 20 MEQ PO TBCR
20.0000 meq | EXTENDED_RELEASE_TABLET | Freq: Two times a day (BID) | ORAL | Status: DC
  Administered 2012-06-16 – 2012-06-19 (×6): 20 meq via ORAL
  Filled 2012-06-16 (×7): qty 1

## 2012-06-16 MED ORDER — SPIRONOLACTONE 25 MG PO TABS
25.0000 mg | ORAL_TABLET | Freq: Every day | ORAL | Status: DC
  Administered 2012-06-16 – 2012-06-19 (×4): 25 mg via ORAL
  Filled 2012-06-16 (×4): qty 1

## 2012-06-16 MED ORDER — ASPIRIN 81 MG PO CHEW: 324.0000 mg | CHEWABLE_TABLET | ORAL | Status: DC

## 2012-06-16 MED ORDER — FUROSEMIDE 20 MG PO TABS
20.0000 mg | ORAL_TABLET | Freq: Every day | ORAL | Status: DC
  Administered 2012-06-16 – 2012-06-19 (×4): 20 mg via ORAL
  Filled 2012-06-16 (×4): qty 1

## 2012-06-16 MED ORDER — SODIUM CHLORIDE 0.9 % IJ SOLN: 3.0000 mL | INTRAMUSCULAR | Status: DC | PRN

## 2012-06-16 MED ORDER — SODIUM CHLORIDE 0.9 % IV SOLN: 250.0000 mL | INTRAVENOUS | Status: DC | PRN

## 2012-06-16 MED ORDER — ACETAMINOPHEN 325 MG PO TABS: 650.0000 mg | ORAL_TABLET | ORAL | Status: DC | PRN

## 2012-06-16 MED ORDER — HEPARIN (PORCINE) IN NACL 2-0.9 UNIT/ML-% IJ SOLN
INTRAMUSCULAR | Status: AC
  Filled 2012-06-16: qty 1500

## 2012-06-16 MED ORDER — FENTANYL CITRATE 0.05 MG/ML IJ SOLN
INTRAMUSCULAR | Status: AC
  Filled 2012-06-16: qty 2

## 2012-06-16 MED ORDER — HEPARIN (PORCINE) IN NACL 100-0.45 UNIT/ML-% IJ SOLN
1100.0000 [IU]/h | INTRAMUSCULAR | Status: DC
  Administered 2012-06-16: 1100 [IU]/h via INTRAVENOUS
  Filled 2012-06-16: qty 250

## 2012-06-16 MED ORDER — POTASSIUM CHLORIDE CRYS ER 20 MEQ PO TBCR: 20.0000 meq | EXTENDED_RELEASE_TABLET | Freq: Once | ORAL | Status: AC

## 2012-06-16 MED ORDER — ONDANSETRON HCL 4 MG/2ML IJ SOLN: 4.0000 mg | Freq: Four times a day (QID) | INTRAMUSCULAR | Status: DC | PRN

## 2012-06-16 MED ORDER — WARFARIN - PHARMACIST DOSING INPATIENT
Freq: Every day | Status: DC
  Administered 2012-06-17 – 2012-06-18 (×2): 1

## 2012-06-16 MED ORDER — DIGOXIN 250 MCG PO TABS
0.2500 mg | ORAL_TABLET | Freq: Every day | ORAL | Status: DC
  Administered 2012-06-16 – 2012-06-19 (×4): 0.25 mg via ORAL
  Filled 2012-06-16 (×4): qty 1

## 2012-06-16 MED ORDER — HEART ATTACK BOUNCING BOOK
Freq: Once | Status: AC
  Administered 2012-06-16: 16:00:00
  Filled 2012-06-16: qty 1

## 2012-06-16 MED ORDER — SODIUM CHLORIDE 0.9 % IV SOLN: INTRAVENOUS | Status: AC

## 2012-06-16 MED ORDER — WARFARIN VIDEO
Freq: Once | Status: AC
  Administered 2012-06-16: 20:00:00

## 2012-06-16 MED ORDER — DIPHENHYDRAMINE HCL 50 MG/ML IJ SOLN
INTRAMUSCULAR | Status: AC
  Filled 2012-06-16: qty 1

## 2012-06-16 MED ORDER — LIDOCAINE HCL (PF) 1 % IJ SOLN
INTRAMUSCULAR | Status: AC
  Filled 2012-06-16: qty 30

## 2012-06-16 MED ORDER — MIDAZOLAM HCL 2 MG/2ML IJ SOLN
INTRAMUSCULAR | Status: AC
  Filled 2012-06-16: qty 2

## 2012-06-16 MED ORDER — HEPARIN (PORCINE) IN NACL 100-0.45 UNIT/ML-% IJ SOLN
1100.0000 [IU]/h | INTRAMUSCULAR | Status: DC
  Administered 2012-06-17: 1100 [IU]/h via INTRAVENOUS
  Filled 2012-06-16 (×3): qty 250

## 2012-06-16 MED ORDER — NITROGLYCERIN 0.2 MG/ML ON CALL CATH LAB
INTRAVENOUS | Status: AC
  Filled 2012-06-16: qty 1

## 2012-06-16 MED ORDER — FUROSEMIDE 10 MG/ML IJ SOLN
40.0000 mg | Freq: Two times a day (BID) | INTRAMUSCULAR | Status: DC
  Administered 2012-06-16: 40 mg via INTRAVENOUS
  Filled 2012-06-16 (×2): qty 4

## 2012-06-16 MED ORDER — SODIUM CHLORIDE 0.9 % IJ SOLN
3.0000 mL | Freq: Two times a day (BID) | INTRAMUSCULAR | Status: DC
  Administered 2012-06-16: 3 mL via INTRAVENOUS

## 2012-06-16 NOTE — CV Procedure (Signed)
Cardiac Cath Procedure Note  Indication: New-onset HF  Procedures performed:  1) Right heart cathererization 2) Selective coronary angiography 3) Left heart catheterization  Description of procedure:     The risks and indication of the procedure were explained. Consent was signed and placed on the chart. An appropriate timeout was taken prior to the procedure. The right groin was prepped and draped in the routine sterile fashion and anesthetized with 1% local lidocaine.   A 5 FR arterial sheath was placed in the right femoral artery using a modified Seldinger technique. Standard catheters including a JL4 and JR4 were used. We did not cross the LV due to the presence of an LV thrombus on echo. All catheter exchanges were made over a wire. A 7 FR venous sheath was placed in the right femoral vein using a modified Seldinger technique. A standard Swan-Ganz catheter was used for the procedure.   Complications:  None apparent  Total contrast: 15cc  Findings:  RA = 4 RV = 39/2/6 PA = 40/23/31 PCW = 19 (v = 23) Fick cardiac output/index = 3.7/2.0 Thermo CO/CI = 3.3/1.7 PVR = 3.5 Woods FA sat = 99% PA sat = 62%, 67%  Ao Pressure: 106/80 (93)  Did not cross Aov due to presence of LV thrombus on echo.  Left main: Normal   LAD: Normal  LCX: Normal  RCA: Normal   Assessment: 1. Normal coronary arteries 2. Severe NICM with EF 15% by echo 3. Normal filling pressures with mildly depressed cardiac output  Plan/Discussion:  She will need aggressive medical therapy for her HF. Switch IV lasix to po. Titrate ACE-I as tolerated. Add digoxin and spiro 12.5. Hold on b-blocker for another day or so. Load coumadin for LV thrombus (heparin bridge).   Arvilla Meres, MD 5:14 PM

## 2012-06-16 NOTE — Progress Notes (Signed)
Pt was transported to cath lab.  Pt was alert and oriented prior to transport without questions or concerns.  Family was at bedside and carried pt belongings.  Vitals were documented prior to transport on doc flow sheets.  Valium was given on call and heparin was d/c on call per order.

## 2012-06-16 NOTE — Progress Notes (Signed)
  Echocardiogram 2D Echocardiogram has been performed.  Margaret Hess 06/16/2012, 10:33 AM

## 2012-06-16 NOTE — Progress Notes (Signed)
ANTICOAGULATION CONSULT NOTE - Initial Consult  Pharmacy Consult for Heparin Indication: ECHO shows LV thrombus  No Known Allergies  Patient Measurements: Height: 5\' 5"  (165.1 cm) Weight: 181 lb (82.1 kg) IBW/kg (Calculated) : 57  Heparin Dosing Weight: 74 kg  Vital Signs: Temp: 98.3 F (36.8 C) (10/15 0800) Temp src: Oral (10/15 0800) BP: 116/86 mmHg (10/15 0948) Pulse Rate: 104  (10/15 0931)  Labs:  Basename 06/16/12 0515 06/15/12 2249 06/15/12 2248 06/15/12 1929 06/15/12 1720  HGB -- -- 14.5 -- 14.3  HCT -- -- 42.4 -- 41.9  PLT -- -- 186 -- 211  APTT -- -- -- -- 28  LABPROT -- -- -- -- 18.7*  INR -- -- -- -- 1.62*  HEPARINUNFRC -- -- -- -- --  CREATININE 0.64 -- 0.60 -- 0.70  CKTOTAL -- -- -- 47 --  CKMB -- -- -- 2.7 --  TROPONINI <0.30 <0.30 -- <0.30 --    Estimated Creatinine Clearance: 93.9 ml/min (by C-G formula based on Cr of 0.64).   Medical History: Past Medical History  Diagnosis Date  . Migraines   . Hypertension   . Diabetes mellitus     Assessment: 45yof with Hx of DM and HTN admitted with new SOB.  CT negative for PE.  ECHO shows EF 20% and LV thrombus.  H/H stable, renal fx stable, slight inc WBC, afebrile.  She has received enoxaparin 40mg  for VTE px this am so will not bolus with heparin, will begin heparin drip.  Goal of Therapy:  Heparin level 0.3-0.7 units/ml Monitor platelets by anticoagulation protocol: Yes   Plan:  Begin heparin drip 1100 uts/hr  Draw Heparin level 6hr after start  Daily CBC and Heparin level   Marcelino Scot 06/16/2012,11:24 AM

## 2012-06-16 NOTE — Progress Notes (Addendum)
ANTICOAGULATION CONSULT NOTE - Initial Consult  Pharmacy Consult for Heparin Indication: ECHO shows LV thrombus  No Known Allergies  Patient Measurements: Height: 5\' 5"  (165.1 cm) Weight: 176 lb 12.9 oz (80.2 kg) IBW/kg (Calculated) : 57  Heparin Dosing Weight: 74 kg  Vital Signs: Temp: 98.5 F (36.9 C) (10/15 1300) Temp src: Oral (10/15 1300) BP: 109/82 mmHg (10/15 1845) Pulse Rate: 100  (10/15 1845)  Labs:  Basename 06/16/12 1807 06/16/12 1034 06/16/12 0515 06/15/12 2249 06/15/12 2248 06/15/12 1929 06/15/12 1720  HGB -- -- -- -- 14.5 -- 14.3  HCT -- -- -- -- 42.4 -- 41.9  PLT -- -- -- -- 186 -- 211  APTT -- -- -- -- -- -- 28  LABPROT -- -- -- -- -- -- 18.7*  INR -- -- -- -- -- -- 1.62*  HEPARINUNFRC 0.16* -- -- -- -- -- --  CREATININE -- -- 0.64 -- 0.60 -- 0.70  CKTOTAL -- -- -- -- -- 47 --  CKMB -- -- -- -- -- 2.7 --  TROPONINI -- <0.30 <0.30 <0.30 -- -- --    Estimated Creatinine Clearance: 92.9 ml/min (by C-G formula based on Cr of 0.64).   Medical History: Past Medical History  Diagnosis Date  . Migraines   . Hypertension   . Diabetes mellitus     Assessment: 45yof with Hx of DM and HTN admitted with new SOB.  CT negative for PE.  ECHO shows EF 20% and LV thrombus.  Now s/p cath, sheath out 1733, to resume heparin 8 hours after sheath out.    Goal of Therapy:  Heparin level 0.3-0.7 units/ml Monitor platelets by anticoagulation protocol: Yes   Plan:  Begin heparin drip 1100 uts/hr  Draw Heparin level 6hr after start  Daily CBC and Heparin level Follow up with MD re warfarin start.   Thank you for allowing pharmacy to be a part of this patients care team.  Lovenia Kim Pharm.D., BCPS Clinical Pharmacist 06/16/2012 7:12 PM Pager: [phone] Phone: [phone]   7:21 PM Spoke with Dr. Gala Romney regarding warfarin start for LV thrombus, wishes to proceed tonight.  Heparin started, to resume 8h after sheath out.  Will initiate warfarin 7.5  mg despite high warfarin score given HF, recent procedure, and heparin status.    Kadisha Goodine Christine Virginia Crews

## 2012-06-16 NOTE — Consult Note (Addendum)
Advanced Heart Failure Team Consult Note  Referring Physician: [facility] Primary Physician: Dr. Laurann Montana Primary Cardiologist: new  Reason for Consultation: New onset systolic heart failure  HPI:    Ms. Lumia is a 46 y.o. female with history of diabetes mellitus for 1 year, hypertension, anxiety and hormonal migraines.  She presented to [facility] with progressive dyspnea with exertion, orthopnea and PND for 2 weeks.  Prior to this event she was able to complete all ADLs and go to the grocery store without difficulty.  She was evaluated by her PCP several weeks ago and when symptoms did not improved she was asked to come to the ER.  Pertinent labs included proBNP 6439, Cr 0.7, AST 522, ALT 639, WBC 11.8, CE negative x3 and d-dimer 11.3.  CTA showed light cardiomegaly with moderate bilateral pleural effusions but no pulmonary emboli.  She has been admitted to the stepdown unit for IV diuresis.    Echo today showed LVEF <20% with sever global hypokineses with minimal regional variation.  Non-mobile, calcified mass on the apical lateral wall consistent with organized thrombus.  Grade 3 diastolic dysfunction.  Moderate TR.  PAPP 48 mmHg.  IVC dilated >2.1 cm but collapses >50%.    She has had a 3.5 L response to IV lasix 40 mg x2 (she is lasix naive).  She has not experienced dyspnea at rest.  She is unsure if DOE has improved.  She denies orthopnea or PND currently.  No dizziness or syncope.  She has not had recent fever/chills or viral illnesses.  No chest pain.     Review of Systems: [y] = yes, [ ]  = no   General: Weight gain [ ] ; Weight loss [ ] ; Anorexia [ ] ; Fatigue [ ] ; Fever [ ] ; Chills [ ] ; Weakness [ ]   Cardiac: Chest pain/pressure [ ] ; Resting SOB [ ] ; Exertional SOB [ y]; Orthopnea [place] ]; Pedal Edema [ ] ; Palpitations [ ] ; Syncope [ ] ; Presyncope [ ] ; Paroxysmal nocturnal dyspnea[y ]  Pulmonary: Cough [ ] ; Wheezing[ ] ; Hemoptysis[ ] ; Sputum [ ] ; Snoring [ ]   GI: Vomiting[ ] ;  Dysphagia[ ] ; Melena[ ] ; Hematochezia [ ] ; Heartburn[ ] ; Abdominal pain [ ] ; Constipation [ ] ; Diarrhea [ ] ; BRBPR [ ]   GU: Hematuria[ ] ; Dysuria [ ] ; Nocturia[ ]   Vascular: Pain in legs with walking [ ] ; Pain in feet with lying flat [ ] ; Non-healing sores [ ] ; Stroke [ ] ; TIA [ ] ; Slurred speech [ ] ;  Neuro: Headaches[ ] ; Vertigo[ ] ; Seizures[ ] ; Paresthesias[ ] ;Blurred vision [ ] ; Diplopia [ ] ; Vision changes [ ]   Ortho/Skin: Arthritis [ ] ; Joint pain [ ] ; Muscle pain [ ] ; Joint swelling [ ] ; Back Pain [ ] ; Rash [ ]   Psych: Depression[ ] ; Anxiety[y ]  Heme: Bleeding problems [ ] ; Clotting disorders [ ] ; Anemia [ ]   Endocrine: Diabetes [ y]; Thyroid dysfunction[ ]   Home Medications Prior to Admission medications   Medication Sig Start Date End Date Taking? Authorizing Provider  buPROPion (WELLBUTRIN) 75 MG tablet Take 75 mg by mouth daily.    Yes Historical Provider, MD  DULoxetine (CYMBALTA) 20 MG capsule Take 20 mg by mouth daily.   Yes Historical Provider, MD  Liraglutide (VICTOZA [employer]) Inject 1.2 mLs into the skin daily.    Yes Historical Provider, MD  metFORMIN (GLUCOPHAGE) 500 MG tablet Take 500 mg by mouth 2 (two) times daily with a meal.   Yes Historical Provider, MD  zolpidem (AMBIEN) 10 MG tablet  Take 10 mg by mouth at bedtime as needed. For sleep   Yes Historical Provider, MD    Past Medical History: Past Medical History  Diagnosis Date  . Migraines   . Hypertension   . Diabetes mellitus     Past Surgical History: History reviewed. No pertinent past surgical history.   Family History: No family history on file. Father died of MI at 108 Paternal grandmother 4 V-CABG  Social History: History   Social History  . Marital Status: Single    Spouse Name: N/A    Number of Children: N/A  . Years of Education: N/A   Social History Main Topics  . Smoking status: Never Smoker   . Smokeless tobacco: Never Used  . Alcohol Use: Yes     1 mixed drink every 6 months  . Drug  Use: No  . Sexually Active: Yes    Birth Control/ Protection: Inserts   Other Topics Concern  . None   Social History Narrative  . None    Allergies:  No Known Allergies  Objective:    Vital Signs:   Temp:  [97.3 F (36.3 C)-98.3 F (36.8 C)] 98.3 F (36.8 C) (10/15 0800) Pulse Rate:  [92-127] 104  (10/15 0931) Resp:  [14-25] 18  (10/15 0931) BP: (106-138)/(62-120) 116/86 mmHg (10/15 0948) SpO2:  [95 %-100 %] 95 % (10/15 0800) Weight:  [179 lb (81.194 kg)-181 lb (82.1 kg)] 181 lb (82.1 kg) (10/14 2058) Last BM Date: 06/15/12  Weight change: Filed Weights   06/15/12 1642 06/15/12 2058  Weight: 179 lb (81.194 kg) 181 lb (82.1 kg)    Intake/Output:   Intake/Output Summary (Last 24 hours) at 06/16/12 1235 Last data filed at 06/16/12 1005  Gross per 24 hour  Intake      3 ml  Output   2525 ml  Net  -2522 ml     Physical Exam: General:  Well appearing. No resp difficulty HEENT: normal Neck: supple. JVP hard to see . Carotids 2+ bilat; no bruits. No lymphadenopathy or thryomegaly appreciated. Cor: PMI nondisplaced. Tachycardic No gallops, rubs, or murmurs. Lungs: clear Abdomen: soft, nontender, nondistended. No hepatosplenomegaly. No bruits or masses. Good bowel sounds. Extremities: no cyanosis, clubbing, rash, trace edema Neuro: alert & orientedx3, cranial nerves grossly intact. moves all 4 extremities w/o difficulty. Affect pleasant  Telemetry: Sinus tach 120s  Labs: Basic Metabolic Panel:  Lab 06/16/12 1610 06/15/12 2248 06/15/12 1720  NA 135 -- 135  K 3.6 -- 4.8  CL 97 -- 96  CO2 27 -- 19  GLUCOSE 155* -- 225*  BUN 20 -- 23  CREATININE 0.64 0.60 0.70  CALCIUM 8.8 -- 9.1  MG -- -- --  PHOS -- -- --    Liver Function Tests:  Lab 06/15/12 1720  AST 522*  ALT 639*  ALKPHOS 54  BILITOT 1.1  PROT 6.7  ALBUMIN 2.8*   No results found for this basename: LIPASE:5,AMYLASE:5 in the last 168 hours No results found for this basename: AMMONIA:3 in the  last 168 hours  CBC:  Lab 06/15/12 2248 06/15/12 1720  WBC 14.3* 11.8*  NEUTROABS -- 5.6  HGB 14.5 14.3  HCT 42.4 41.9  MCV 86.7 86.4  PLT 186 211    Cardiac Enzymes:  Lab 06/16/12 1034 06/16/12 0515 06/15/12 2249 06/15/12 1929  CKTOTAL -- -- -- 47  CKMB -- -- -- 2.7  CKMBINDEX -- -- -- --  TROPONINI <0.30 <0.30 <0.30 <0.30    BNP: BNP (last  3 results)  Basename 06/15/12 1720  PROBNP 6932.0*    CBG: No results found for this basename: GLUCAP:5 in the last 168 hours  Coagulation Studies:  Basename 06/15/12 1720  LABPROT 18.7*  INR 1.62*    Other results: EKG: Sinus tach 123 bpm  Imaging: Ct Angio Chest Pe W/cm &/or Wo Cm  06/15/2012  *RADIOLOGY REPORT*  Clinical Data: Cardia.  Dyspnea.  Elevated D-dimer.  CT ANGIOGRAPHY CHEST  Technique:  Multidetector CT imaging of the chest using the standard protocol during bolus administration of intravenous contrast. Multiplanar reconstructed images including MIPs were obtained and reviewed to evaluate the vascular anatomy.  Contrast: 80mL OMNIPAQUE IOHEXOL 350 MG/ML SOLN  Comparison: None.  Findings: There are no pulmonary emboli.  There is slight cardiomegaly.  No pulmonary edema. The lungs are clear.  There are moderate bilateral pleural effusions.  No osseous abnormality.  No visible coronary artery calcifications.  No significant adenopathy.  IMPRESSION: Slight cardiomegaly with moderate bilateral pleural effusions.  No pulmonary emboli.   Original Report Authenticated By: Gwynn Burly, M.D.       Medications:     Current Medications:    . aspirin EC  81 mg Oral Daily  . buPROPion  75 mg Oral Daily  . DULoxetine  20 mg Oral Daily  . furosemide  40 mg Intravenous Once  . furosemide  40 mg Intravenous Q12H  . insulin aspart  0-15 Units Subcutaneous TID WC  . lisinopril  10 mg Oral Daily  . sodium chloride  3 mL Intravenous Q12H  . DISCONTD: enoxaparin  40 mg Subcutaneous Q24H  . DISCONTD: furosemide  20 mg  Intravenous Q12H     Infusions:    . heparin        Assessment:   1. 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 LV thrombus    - on heparin 3. Cardiogenic shock/low output  4. Transaminitis  5. DM 6. Anxiety 7. Hypertension  Plan/Discussion:    Ms. Smedberg is a 46 year old female who presents with acute systolic heart failure with EF <20%.  She has responded well to diuresis, out ~4.5 liters.  Would like to evaluate etiology of heart failure with left heart cath as patient has DM and family history of CAD.  Will also undergo right heart catheterization to evaluate hemodynamics as patient appears to be low output with elevated liver enzymes and tachycardia.  She may require inotrope support and PICC placement if this is the case.  The patient agrees to proceed with heart cath.  Will continue to titrate afterload reduction while in house.  No beta blocker at this time with acute decompensation.  Would add digoxin.    Patient has LV thrombus noted on echo.  Currently on heparin.  After cath will start coumadin.    Will continue to discuss heart failure diagnosis while in house.    Length of Stay: 1  Robbi Garter, [facility] 06/16/2012, 12:35 PM   Patient seen and examined with Ulyess Blossom, PA-C. We discussed all aspects of the encounter. I agree with the assessment and plan as stated above.   Ms. Heinle has several week h/o of HF and low-output symptoms. Echo reviewed with severe LV systolic and diastolic dysfunction and LV thrombus. Volume status seems pretty good but transaminitis concerning for low-output HF. Suspect she has NICM but given DM and FHx of premature CAD she is also at risk for ICM. Plan coronary angiography  and RHC today (will not cross AoV due to LV thrombus).   Will need to titrate HF meds as tolerated. Heparin -> coumadin post cath. Check HIV/Hepatitis panels.   Risks/indications of cath explained. Willing to proceed.   OK to transfer to  HF service if primary team would like.   Militza Devery,MD 4:19 PM

## 2012-06-16 NOTE — Progress Notes (Signed)
[facility] Progress Note [place] TEAM 1 - Stepdown/ICU TEAM   Margaret Hess ZOX:[id number] DOB: Sep 15, 1965 DOA: 06/15/2012 PCP: Cala Bradford, MD  Brief narrative: 46 year old female with a history of diabetes type 2 on Victoza and metformin. She was in her usual state of health until about 2 weeks prior when she began developing increasing dyspnea on exertion with orthopnea and paroxysmal nocturnal dyspnea. She presented to the emergency department because of shortness of breath. She denied issues such as chest pain, lower extremity edema or calf tenderness. In addition she denied issues related to recent viral syndromes, recent distal travel or any contacts with sick persons. She denied alcohol or drug use and does not smoke cigarettes. In the emergency department her d-dimer was markedly elevated at greater than 11 so a CT angiogram of the chest was ordered but this did not demonstrate any evidence of pulmonary embolus but did demonstrate a finding of bilateral pleural effusions moderate in size. A pro BNP was checked and was elevated at 6439. She had mild elevation in PT/INR and her LFTs were elevated in a nonobstructive pattern with ALT 639 AST 522. She has had borderline leukocytosis with a white count of 10,800 and hemoglobin of 14.8. EKG was unremarkable and had no evidence of LVH or ischemic changes. She was given 40 mg of IV Lasix in the emergency department and diuresed 1300 cc and her symptoms improved markedly. She was subsequently admitted to the step down unit for further monitoring and treatment.  Assessment/Plan: Principal Problem:  Acute respiratory failure with hypoxia due to :  *A) Acute systolic CHF (congestive heart failure), NYHA class 4-EF <20% B)  Moderate Bilateral pleural effusions C) Grade 3 acute diastolic dysfunction with heart failure *Acute severe systolic dysfunction is a new finding on echocardiogram this admission and etiology unclear. Could either be  ischemic or idiopathic in etiology *Formal cardiology consultation has been obtained *Continue Lasix 40 mg IV every 12 hours-potassium earlier this morning low at 3.6 so we'll add supplemental potassium to prevent hypokalemia during administration of diuretics *Echocardiogram reveals elevated left atrial pressure and elevated right atrial pressure so despite tolerating room air she continues with clinical signs of significant volume overload *ACE inhibitor was initiated this admission-this should also help with afterload reduction and severe diastolic dysfunction  Active Problems:  LV (left ventricular) mural thrombus without MI *Likely etiology due to severe systolic dysfunction *Initiate full dose IV heparin with pharmacy dosing   Chronic passive hepatic congestion *Secondary to acute heart failure *Trend LFTs  Pulmonary hypertension *Suspect acute and related to the severity of the patient's systolic and diastolic heart failure *May need to add nitrate although severity is mild   Dyspnea on exertion *Secondary to acute heart failure and improved with diuresis   Positive D dimer/Abnormal coagulation profile *Likely related to combination of passive hepatic congestion as well as acute reaction to LV thrombus *Trend PT and INR   Diabetes mellitus *Victoza and metformin on hold during acute illness especially with associated hepatic congestion *Continue sliding scale *Check hemoglobin A1c   HTN (hypertension) *Current blood pressure well controlled and was not on medications prior to admission   Anxiety *Continue Wellbutrin and Cymbalta although may need temporarily hold until LFTs improve   Leukocytosis *Likely reactive due to acute illness with as well as LV thrombus   DVT prophylaxis: Changing DVT prophylaxis Lovenox to full dose heparin Code Status: Full Family Communication: Spoke with patient at bedside Disposition Plan: Remain in step  down  Consultants: Cardiology  Procedures: None  Antibiotics: None  HPI/Subjective: Patient endorses improved respiratory symptoms after administration of Lasix although states has not gotten up and move around significantly so unclear if the only has resolved. Later returned to room to discuss patient's abnormal 2-D echocardiogram results and plan of care. Clarified no recent viral illnesses. Clarifies has never seen cardiologist before nor had previous echocardiogram.   Objective: Blood pressure 116/86, pulse 104, temperature 98.3 F (36.8 C), temperature source Oral, resp. rate 18, height 5\' 5"  (1.651 m), weight 82.1 kg (181 lb), SpO2 95.00%.  Intake/Output Summary (Last 24 hours) at 06/16/12 1252 Last data filed at 06/16/12 1005  Gross per 24 hour  Intake      3 ml  Output   2525 ml  Net  -2522 ml     Exam: General: No acute respiratory distress Lungs: There to auscultation bilaterally but diminished in the bases bilaterally, room air with saturations 95%, talking without evidence of dyspnea or tachypnea Cardiovascular: Regular rate and rhythm without murmur gallop or rub normal S1 and S2, trace nonpitting lower sternum the edema bilaterally Abdomen: Nontender, nondistended, soft, bowel sounds positive, no rebound, no ascites, no appreciable mass Musculoskeletal: No significant cyanosis, clubbing of bilateral lower extremities Neurological: Alert and oriented x3, moves all extremities x4, able to cannulate the bedside commode without difficulty. Data Reviewed: Basic Metabolic Panel:  Lab 06/16/12 1191 06/15/12 2248 06/15/12 1720  NA 135 -- 135  K 3.6 -- 4.8  CL 97 -- 96  CO2 27 -- 19  GLUCOSE 155* -- 225*  BUN 20 -- 23  CREATININE 0.64 0.60 0.70  CALCIUM 8.8 -- 9.1  MG -- -- --  PHOS -- -- --   Liver Function Tests:  Lab 06/15/12 1720  AST 522*  ALT 639*  ALKPHOS 54  BILITOT 1.1  PROT 6.7  ALBUMIN 2.8*   No results found for this basename:  LIPASE:5,AMYLASE:5 in the last 168 hours No results found for this basename: AMMONIA:5 in the last 168 hours CBC:  Lab 06/15/12 2248 06/15/12 1720  WBC 14.3* 11.8*  NEUTROABS -- 5.6  HGB 14.5 14.3  HCT 42.4 41.9  MCV 86.7 86.4  PLT 186 211   Cardiac Enzymes:  Lab 06/16/12 1034 06/16/12 0515 06/15/12 2249 06/15/12 1929  CKTOTAL -- -- -- 47  CKMB -- -- -- 2.7  CKMBINDEX -- -- -- --  TROPONINI <0.30 <0.30 <0.30 <0.30   BNP (last 3 results)  Basename 06/15/12 1720  PROBNP 6932.0*   CBG: No results found for this basename: GLUCAP:5 in the last 168 hours  No results found for this or any previous visit (from the past 240 hour(s)).   Studies:  Recent x-ray studies have been reviewed in detail by the Attending Physician  Scheduled Meds:  Reviewed in detail by the Attending Physician   Junious Silk, ANP [facility] Office  [phone] Pager [phone]  On-Call/Text Page:      [email or web address]      password [email or web address]  If 7PM-7AM, please contact night-coverage [email or web address] Password [email or web address] 06/16/2012, 12:52 PM   LOS: 1 day   Patient was seen and examined at bedside. Labs and diagnostic studies were reviewed with the patient. I agree with the assessment and plan by nurse practitioner above.  Assessment and Plan:  Principal Problem: *Acute systolic CHF exacerbation - based on 2 D ECHO done 06/16/2012 EF 20%  - plan for cath today - patient started on IV heparin - appreciate cardiology consult - we  have increased lasix to 40 mg Q 12 hours OV - continue strict intake and output  Manson Passey [facility]  [phone]

## 2012-06-16 NOTE — Progress Notes (Signed)
Contacted MD; echo completed and preliminary results show EF fraction approximately 17, and suggest LV clot.  Nurse will continue to monitor

## 2012-06-16 NOTE — Plan of Care (Signed)
Problem: Phase II Progression Outcomes Goal: Dyspnea controlled with activity Outcome: Progressing Dyspnea controlled when ambulating and using bedside commode

## 2012-06-17 ENCOUNTER — Inpatient Hospital Stay (HOSPITAL_COMMUNITY): Payer: BC Managed Care – PPO

## 2012-06-17 DIAGNOSIS — I428 Other cardiomyopathies: Secondary | ICD-10-CM

## 2012-06-17 DIAGNOSIS — K761 Chronic passive congestion of liver: Secondary | ICD-10-CM

## 2012-06-17 DIAGNOSIS — I509 Heart failure, unspecified: Secondary | ICD-10-CM

## 2012-06-17 DIAGNOSIS — I5021 Acute systolic (congestive) heart failure: Secondary | ICD-10-CM

## 2012-06-17 HISTORY — DX: Other cardiomyopathies: I42.8

## 2012-06-17 LAB — BASIC METABOLIC PANEL
CO2: 25 mEq/L (ref 19–32)
Calcium: 8.2 mg/dL — ABNORMAL LOW (ref 8.4–10.5)
GFR calc Af Amer: >90 mL/min (ref >90)
GFR calc non Af Amer: >90 mL/min (ref >90)
Sodium: 138 mEq/L (ref 135–145)

## 2012-06-17 LAB — CBC
HCT: 39 % (ref 36.0–46.0)
Hemoglobin: 13.3 g/dL (ref 12.0–15.0)
MCV: 86.5 fL (ref 78.0–100.0)
RBC: 4.51 MIL/uL (ref 3.87–5.11)
WBC: 9.3 10*3/uL (ref 4.0–10.5)

## 2012-06-17 LAB — HEPATITIS PANEL, ACUTE
Hep A IgM: NEGATIVE
Hep B C IgM: NEGATIVE
Hepatitis B Surface Ag: NEGATIVE

## 2012-06-17 LAB — PROTIME-INR
INR: 1.29 (ref 0.00–1.49)
Prothrombin Time: 15.8 seconds — ABNORMAL HIGH (ref 11.6–15.2)

## 2012-06-17 LAB — HEPARIN LEVEL (UNFRACTIONATED): Heparin Unfractionated: 0.15 IU/mL — ABNORMAL LOW (ref 0.30–0.70)

## 2012-06-17 LAB — RETICULOCYTES
RBC.: 4.43 MIL/uL (ref 3.87–5.11)
Retic Count, Absolute: 75.3 10*3/uL (ref 19.0–186.0)
Retic Ct Pct: 1.7 % (ref 0.4–3.1)

## 2012-06-17 LAB — HEPATIC FUNCTION PANEL
Albumin: 2.2 g/dL — ABNORMAL LOW (ref 3.5–5.2)
Total Protein: 5.5 g/dL — ABNORMAL LOW (ref 6.0–8.3)

## 2012-06-17 LAB — GLUCOSE, CAPILLARY

## 2012-06-17 LAB — IRON AND TIBC
Saturation Ratios: 8 % — ABNORMAL LOW (ref 20–55)
UIBC: 323 ug/dL (ref 125–400)

## 2012-06-17 LAB — FERRITIN: Ferritin: 90 ng/mL (ref 10–291)

## 2012-06-17 LAB — LIPID PANEL
HDL: 23 mg/dL — ABNORMAL LOW (ref >39)
LDL Cholesterol: 64 mg/dL (ref 0–99)
Total CHOL/HDL Ratio: 5.3 RATIO
Triglycerides: 169 mg/dL — ABNORMAL HIGH (ref <150)
VLDL: 34 mg/dL (ref 0–40)

## 2012-06-17 LAB — FOLATE: Folate: >20 ng/mL

## 2012-06-17 LAB — PRO B NATRIURETIC PEPTIDE: Pro B Natriuretic peptide (BNP): 1930 pg/mL — ABNORMAL HIGH (ref 0–125)

## 2012-06-17 LAB — HIV ANTIBODY (ROUTINE TESTING W REFLEX): HIV: NONREACTIVE

## 2012-06-17 MED ORDER — WARFARIN SODIUM 7.5 MG PO TABS
7.5000 mg | ORAL_TABLET | Freq: Once | ORAL | Status: AC
  Administered 2012-06-17: 7.5 mg via ORAL
  Filled 2012-06-17 (×3): qty 1

## 2012-06-17 MED ORDER — ENOXAPARIN SODIUM 80 MG/0.8ML ~~LOC~~ SOLN
80.0000 mg | Freq: Two times a day (BID) | SUBCUTANEOUS | Status: DC
  Administered 2012-06-17 – 2012-06-19 (×5): 80 mg via SUBCUTANEOUS
  Filled 2012-06-17 (×8): qty 0.8

## 2012-06-17 MED ORDER — LISINOPRIL 10 MG PO TABS
10.0000 mg | ORAL_TABLET | Freq: Two times a day (BID) | ORAL | Status: DC
  Administered 2012-06-17 – 2012-06-19 (×4): 10 mg via ORAL
  Filled 2012-06-17 (×5): qty 1

## 2012-06-17 MED ORDER — GADOBENATE DIMEGLUMINE 529 MG/ML IV SOLN
25.0000 mL | Freq: Once | INTRAVENOUS | Status: AC
  Administered 2012-06-17: 25 mL via INTRAVENOUS

## 2012-06-17 NOTE — Plan of Care (Signed)
Problem: Phase I Progression Outcomes Goal: EF % per last Echo/documented,Core Reminder form on chart Outcome: Completed/Met Date Met:  06/17/12 06/16/12 EF 20 % per ECHO

## 2012-06-17 NOTE — Progress Notes (Signed)
[facility] Progress Note [place] TEAM 1 - Stepdown/ICU TEAM   Margaret Hess JWJ:[id number] DOB: 01/20/1966 DOA: 06/15/2012 PCP: Cala Bradford, MD  Brief narrative: 46 year old female with a history of diabetes type 2 on Victoza and metformin. She was in her usual state of health until about 2 weeks prior when she began developing increasing dyspnea on exertion with orthopnea and paroxysmal nocturnal dyspnea. She presented to the emergency department because of shortness of breath. She denied issues such as chest pain, lower extremity edema or calf tenderness. In addition she denied issues related to recent viral syndromes, recent distal travel or any contacts with sick persons. She denied alcohol or drug use and does not smoke cigarettes. In the emergency department her d-dimer was markedly elevated at greater than 11 so a CT angiogram of the chest was ordered but this did not demonstrate any evidence of pulmonary embolus but did demonstrate a finding of bilateral pleural effusions moderate in size. A pro BNP was checked and was elevated at 6439. She had mild elevation in PT/INR and her LFTs were elevated in a nonobstructive pattern with ALT 639 AST 522. She has had borderline leukocytosis with a white count of 10,800 and hemoglobin of 14.8. EKG was unremarkable with no evidence of LVH or ischemic changes. She was given 40 mg of IV Lasix in the emergency department and diuresed 1300 cc and her symptoms improved markedly. She was subsequently admitted to the step down unit for further monitoring and treatment.  Assessment/Plan:  Acute respiratory failure with hypoxia due to : A) Acute systolic CHF (congestive heart failure), NYHA class 4 - EF <15% per cath - NICM B)  Moderate Bilateral pleural effusions C) Grade 3 acute diastolic dysfunction with heart failure *Acute severe systolic dysfunction due to NICM - RH/LH cath negative *Formal cardiology consultation has been  obtained *Continue Lasix 20 mg PO daily with spironolactone *Cards titrateing ACE I for afterload reduction *Continue digoxin and likely will have coreg added 10/17 *Cards has ordered MRI to evaluate for myocarditis or other infiltrative disease. Also checking SPEP/UPEP and iron studies  LV (left ventricular) mural thrombus without MI *Continue Lovenox and Coumadin *Case manager aware of need for home Lovenox bridge  Chronic passive hepatic congestion *Secondary to acute heart failure *LFTs trending down after diuresis  Hypokalemia * Continue oral replete  Mild Pulmonary hypertension *Suspect acute and related to the severity of the patient's systolic and diastolic heart failure  Diabetes mellitus *Victoza and metformin on hold during acute illness especially with associated hepatic congestion *Continue sliding scale insulin *Hemoglobin A1c 7.0  Sub optimal HDL *Will need statin added once LFT's back to normal  HTN (hypertension) *Current blood pressure well controlled and was not on medications prior to admission  Anxiety *Continue Wellbutrin and Cymbalta   Leukocytosis *Likely reactive due to acute illness with as well as LV thrombus  DVT prophylaxis: Changing DVT prophylaxis Lovenox to full dose heparin Code Status: Full Family Communication: Spoke with patient at bedside Disposition Plan: Transfer to telemetry  Consultants: Cardiology  Procedures: 06/16/2012 right and left heart cath with selective coronary angiography  Antibiotics: None  HPI/Subjective: The patient is resting comfortably in bed.  She does admit to significant dyspnea with the slightest exertion.  She denies chest pain shortness of breath fevers chills nausea or vomiting.   Objective: Blood pressure 124/86, pulse 114, temperature 98.4 F (36.9 C), temperature source Oral, resp. rate 21, height 5\' 5"  (1.651 m), weight 80.2 kg (176 lb 12.9  oz), SpO2 100.00%.  Intake/Output Summary (Last 24  hours) at 06/17/12 1259 Last data filed at 06/17/12 0949  Gross per 24 hour  Intake   1159 ml  Output   1900 ml  Net   -741 ml     Exam: General: No acute respiratory distress at rest Lungs: Clear to auscultation bilaterally but diminished in the bases bilaterally, room air with saturations 95%, talking without evidence of dyspnea or tachypnea Cardiovascular: Regular rate and rhythm without murmur gallop or rub normal S1 and S2, no lower extremity edema bilaterally Abdomen: Nontender, nondistended, soft, bowel sounds positive, no rebound, no ascites, no appreciable mass Musculoskeletal: No significant cyanosis, clubbing of bilateral lower extremities Neurological: Alert and oriented x3, moves all extremities x4  Data Reviewed: Basic Metabolic Panel:  Lab 06/17/12 6578 06/16/12 0515 06/15/12 2248 06/15/12 1720  NA 138 135 -- 135  K 3.3* 3.6 -- 4.8  CL 100 97 -- 96  CO2 25 27 -- 19  GLUCOSE 153* 155* -- 225*  BUN 15 20 -- 23  CREATININE 0.60 0.64 0.60 0.70  CALCIUM 8.2* 8.8 -- 9.1  MG -- -- -- --  PHOS -- -- -- --   Liver Function Tests:  Lab 06/17/12 0455 06/15/12 1720  AST 149* 522*  ALT 348* 639*  ALKPHOS 51 54  BILITOT 0.5 1.1  PROT 5.5* 6.7  ALBUMIN 2.2* 2.8*   CBC:  Lab 06/17/12 0455 06/15/12 2248 06/15/12 1720  WBC 9.3 14.3* 11.8*  NEUTROABS -- -- 5.6  HGB 13.3 14.5 14.3  HCT 39.0 42.4 41.9  MCV 86.5 86.7 86.4  PLT 174 186 211   Cardiac Enzymes:  Lab 06/16/12 1034 06/16/12 0515 06/15/12 2249 06/15/12 1929  CKTOTAL -- -- -- 47  CKMB -- -- -- 2.7  CKMBINDEX -- -- -- --  TROPONINI <0.30 <0.30 <0.30 <0.30   BNP (last 3 results)  Basename 06/17/12 0455 06/15/12 1720  PROBNP 1930.0* 6932.0*   CBG:  Lab 06/17/12 1240 06/17/12 0752 06/16/12 2156 06/16/12 1856 06/16/12 1735  GLUCAP 158* 115* 205* 113* 102*    Studies:  Recent x-ray studies have been reviewed in detail by the Attending Physician  Scheduled Meds:  Reviewed in detail by the  Attending Physician   Junious Silk, ANP [facility] Office  [phone] Pager [phone]  On-Call/Text Page:      [email or web address]      password [email or web address]  If 7PM-7AM, please contact night-coverage [email or web address] Password [email or web address] 06/17/2012, 12:59 PM   LOS: 2 days   I have personally examined this patient and reviewed the entire database. I have reviewed the above note, made any necessary editorial changes, and agree with its content.  Lonia Blood, MD [facility]

## 2012-06-17 NOTE — Progress Notes (Deleted)
ANTICOAGULATION CONSULT NOTE - Initial Consult  Pharmacy Consult for Heparin Indication: ECHO shows LV thrombus  No Known Allergies  Patient Measurements: Height: 5\' 5"  (165.1 cm) Weight: 176 lb 12.9 oz (80.2 kg) IBW/kg (Calculated) : 57  Heparin Dosing Weight: 74 kg  Vital Signs: Temp: 98.4 F (36.9 C) (10/16 0823) Temp src: Oral (10/16 0823) BP: 124/86 mmHg (10/16 0823) Pulse Rate: 114  (10/16 0823)  Labs:  Basename 06/17/12 0715 06/17/12 0455 06/16/12 1807 06/16/12 1034 06/16/12 0515 06/15/12 2249 06/15/12 2248 06/15/12 1929 06/15/12 1720  HGB -- 13.3 -- -- -- -- 14.5 -- --  HCT -- 39.0 -- -- -- -- 42.4 -- 41.9  PLT -- 174 -- -- -- -- 186 -- 211  APTT -- -- -- -- -- -- -- -- 28  LABPROT -- 15.8* -- -- -- -- -- -- 18.7*  INR -- 1.29 -- -- -- -- -- -- 1.62*  HEPARINUNFRC 0.15* -- 0.16* -- -- -- -- -- --  CREATININE -- 0.60 -- -- 0.64 -- 0.60 -- --  CKTOTAL -- -- -- -- -- -- -- 47 --  CKMB -- -- -- -- -- -- -- 2.7 --  TROPONINI -- -- -- <0.30 <0.30 <0.30 -- -- --    Estimated Creatinine Clearance: 92.9 ml/min (by C-G formula based on Cr of 0.6).   Medical History: Past Medical History  Diagnosis Date  . Migraines   . Hypertension   . Diabetes mellitus     Assessment: 45yof with Hx of DM and HTN admitted with new SOB.  CT negative for PE.  ECHO shows EF 20% and LV thrombus. Heparin level subtherapeutic this AM. Coumadin was started last PM. INR 1.29 today.    Goal of Therapy:  Heparin level 0.3-0.7 units/ml Monitor platelets by anticoagulation protocol: Yes INR = 2-3   Plan:    1. Increase heparin to 1300 units/hr 2. Check 6hr heparin level 3. Coumadin 7.5mg  PO x1

## 2012-06-17 NOTE — Progress Notes (Signed)
ANTICOAGULATION CONSULT NOTE - Follow Up Consult  Pharmacy Consult for Heparin-->Lovenox, Coumadin Indication: LV thrombus  No Known Allergies  Patient Measurements: Height: 5\' 5"  (165.1 cm) Weight: 176 lb 12.9 oz (80.2 kg) IBW/kg (Calculated) : 57   Vital Signs: Temp: 98.4 F (36.9 C) (10/16 0823) Temp src: Oral (10/16 0823) BP: 124/86 mmHg (10/16 0823) Pulse Rate: 114  (10/16 0823)  Labs:  Basename 06/17/12 0715 06/17/12 0455 06/16/12 1807 06/16/12 1034 06/16/12 0515 06/15/12 2249 06/15/12 2248 06/15/12 1929 06/15/12 1720  HGB -- 13.3 -- -- -- -- 14.5 -- --  HCT -- 39.0 -- -- -- -- 42.4 -- 41.9  PLT -- 174 -- -- -- -- 186 -- 211  APTT -- -- -- -- -- -- -- -- 28  LABPROT -- 15.8* -- -- -- -- -- -- 18.7*  INR -- 1.29 -- -- -- -- -- -- 1.62*  HEPARINUNFRC 0.15* -- 0.16* -- -- -- -- -- --  CREATININE -- 0.60 -- -- 0.64 -- 0.60 -- --  CKTOTAL -- -- -- -- -- -- -- 47 --  CKMB -- -- -- -- -- -- -- 2.7 --  TROPONINI -- -- -- <0.30 <0.30 <0.30 -- -- --    Estimated Creatinine Clearance: 92.9 ml/min (by C-G formula based on Cr of 0.6).  Assessment: 45yof started on heparin yesterday for LV thrombus seen on ECHO. Taken to cath where she was found to have an EF of 15% and minimal nonobs CAD. Heparin resumed after cath and coumadin started. Heparin level this morning is subtherapeutic. Will switch to full dose lovenox as patient may go home soon. INR is subtherapeutic after first dose of coumadin as expected. No bleeding noted. CBC stable.  Goal of Therapy:  INR 2-3 Anti-Xa 0.6-1.2 Monitor platelets by anticoagulation protocol: Yes   Plan:  1) D/C heparin gtt 2) 1 hour after heparin off start lovenox 80mg  sq q12 3) Repeat coumadin 7.5mg  x 1 4) Follow up CBC, INR in AM   Fredrik Rigger 06/17/2012,8:46 AM

## 2012-06-17 NOTE — Progress Notes (Addendum)
Advanced Heart Failure Rounding Note   Subjective:    46 yo. With history of DB, HTN, and anxiety presents with new onset systolic heart failure, EF <20%.  LV thrombus noted.  Grade 3 diastolic dysfunction.  Mod TR  Cath 06/16/12: RA = 4  RV = 39/2/6  PA = 40/23/31  PCW = 19 (v = 23)  Fick cardiac output/index = 3.7/2.0  Thermo CO/CI = 3.3/1.7  PVR = 3.5 Woods  FA sat = 99%  PA sat = 62%, 67%  Ao Pressure: 106/80 (93)  Did not cross Aov due to presence of LV thrombus on echo.   Left main: Normal  LAD: Normal  LCX: Normal  RCA: Normal  Digoxin, spiro, and coumadin started yesterday.  Mild dyspnea but not ambulating in the halls.  No chest pain.  No orthopnea/PND.    Hepatitis and HIV pending.  Objective:     Vital Signs:   Temp:  [97.9 F (36.6 C)-98.5 F (36.9 C)] 98.2 F (36.8 C) (10/16 0405) Pulse Rate:  [92-115] 104  (10/16 0700) Resp:  [15-25] 20  (10/16 0700) BP: (101-128)/(69-91) 103/76 mmHg (10/16 0700) SpO2:  [90 %-100 %] 94 % (10/16 0700) Weight:  [80.2 kg (176 lb 12.9 oz)] 80.2 kg (176 lb 12.9 oz) (10/15 1444) Last BM Date: 06/15/12  Weight change: Filed Weights   06/15/12 1642 06/15/12 2058 06/16/12 1444  Weight: 81.194 kg (179 lb) 82.1 kg (181 lb) 80.2 kg (176 lb 12.9 oz)    Intake/Output:   Intake/Output Summary (Last 24 hours) at 06/17/12 0734 Last data filed at 06/17/12 0600  Gross per 24 hour  Intake   1042 ml  Output   3250 ml  Net  -2208 ml     Physical Exam:            General:  Well appearing. No resp difficulty HEENT: normal Neck: supple. JVP appears flat.  Carotids 2+ bilat; no bruits. No lymphadenopathy or thryomegaly appreciated. Cor: PMI nondisplaced. Tachycardic No gallops, rubs, or murmurs. Lungs: clear Abdomen: soft, nontender, nondistended. No hepatosplenomegaly. No bruits or masses. Good bowel sounds. Extremities: no cyanosis, clubbing, rash, edema Neuro: alert & orientedx3, cranial nerves grossly intact. moves all 4  extremities w/o difficulty. Affect pleasant     Telemetry: Sinus tach 110-120s  Labs: Basic Metabolic Panel:  Lab 06/17/12 4540 06/16/12 0515 06/15/12 2248 06/15/12 1720  NA 138 135 -- 135  K 3.3* 3.6 -- 4.8  CL 100 97 -- 96  CO2 25 27 -- 19  GLUCOSE 153* 155* -- 225*  BUN 15 20 -- 23  CREATININE 0.60 0.64 0.60 0.70  CALCIUM 8.2* 8.8 -- 9.1  MG -- -- -- --  PHOS -- -- -- --    Liver Function Tests:  Lab 06/17/12 0455 06/15/12 1720  AST 149* 522*  ALT 348* 639*  ALKPHOS 51 54  BILITOT 0.5 1.1  PROT 5.5* 6.7  ALBUMIN 2.2* 2.8*   No results found for this basename: LIPASE:5,AMYLASE:5 in the last 168 hours No results found for this basename: AMMONIA:3 in the last 168 hours  CBC:  Lab 06/17/12 0455 06/15/12 2248 06/15/12 1720  WBC 9.3 14.3* 11.8*  NEUTROABS -- -- 5.6  HGB 13.3 14.5 14.3  HCT 39.0 42.4 41.9  MCV 86.5 86.7 86.4  PLT 174 186 211    Cardiac Enzymes:  Lab 06/16/12 1034 06/16/12 0515 06/15/12 2249 06/15/12 1929  CKTOTAL -- -- -- 47  CKMB -- -- -- 2.7  CKMBINDEX -- -- -- --  TROPONINI <0.30 <0.30 <0.30 <0.30    BNP: BNP (last 3 results)  Basename 06/17/12 0455 06/15/12 1720  PROBNP 1930.0* 6932.0*       Imaging: Ct Angio Chest Pe W/cm &/or Wo Cm  06/15/2012  *RADIOLOGY REPORT*  Clinical Data: Cardia.  Dyspnea.  Elevated D-dimer.  CT ANGIOGRAPHY CHEST  Technique:  Multidetector CT imaging of the chest using the standard protocol during bolus administration of intravenous contrast. Multiplanar reconstructed images including MIPs were obtained and reviewed to evaluate the vascular anatomy.  Contrast: 80mL OMNIPAQUE IOHEXOL 350 MG/ML SOLN  Comparison: None.  Findings: There are no pulmonary emboli.  There is slight cardiomegaly.  No pulmonary edema. The lungs are clear.  There are moderate bilateral pleural effusions.  No osseous abnormality.  No visible coronary artery calcifications.  No significant adenopathy.  IMPRESSION: Slight cardiomegaly  with moderate bilateral pleural effusions.  No pulmonary emboli.   Original Report Authenticated By: Gwynn Burly, M.D.       Medications:     Scheduled Medications:    . aspirin EC  81 mg Oral Daily  . buPROPion  75 mg Oral Daily  . coumadin book   Does not apply Once  . diazepam  5 mg Oral On Call  . digoxin  0.25 mg Oral Daily  . diphenhydrAMINE      . DULoxetine  20 mg Oral Daily  . fentaNYL      . furosemide  20 mg Oral Daily  . heart attack bouncing book   Does not apply Once  . heparin      . insulin aspart  0-15 Units Subcutaneous TID WC  . lidocaine      . lisinopril  10 mg Oral Daily  . midazolam      . nitroGLYCERIN      . potassium chloride SA      . potassium chloride  20 mEq Oral BID  . potassium chloride  20 mEq Oral Once  . sodium chloride  3 mL Intravenous Q12H  . spironolactone  25 mg Oral Daily  . warfarin  7.5 mg Oral NOW  . warfarin   Does not apply Once  . Warfarin - Pharmacist Dosing Inpatient   Does not apply q1800  . DISCONTD: aspirin  324 mg Oral Pre-Cath  . DISCONTD: enoxaparin  40 mg Subcutaneous Q24H  . DISCONTD: furosemide  40 mg Intravenous Q12H  . DISCONTD: sodium chloride  3 mL Intravenous Q12H     Infusions:    . sodium chloride 50 mL/hr (06/16/12 1850)  . heparin 1,100 Units/hr (06/17/12 0142)  . DISCONTD: heparin Stopped (06/16/12 1600)     PRN Medications:  sodium chloride, acetaminophen, acetaminophen, LORazepam, ondansetron (ZOFRAN) IV, ondansetron (ZOFRAN) IV, sodium chloride, DISCONTD: sodium chloride, DISCONTD: sodium chloride   Assessment:   1. 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 NICM, EF <20% 2. LV thrombus  - on heparin  3. Transaminitis  4. DM  5. Anxiety  6. Hypertension  Plan/Discussion:    Volume status remains stable.  Tolerating addition of spiro, digoxin and lasix today.  Will titrate afterload reduction with tachycardia, increase lisinopril 10 mg BID.  Will check digoxin level  next week.    Coumadin started for LV thrombus.  Heparin not therapeutic will switch to lovenox as patient may be able to bridge at home.  She is comfortable with this and would prefer to go home.    PT consult.   Dispo: possibly home in  2 days after med titration completed with lovenox bridge.   Length of Stay: 2   Robbi Garter, [facility] 06/17/2012, 7:34 AM  Patient seen with PA, agree with the above note. Patient appears euvolemic on exam.  She is mildly tachycardic. Agree with increasing lisinopril today to bid. Continue other meds. Lasix changed to po. Add low dose Coreg tomorrow.   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 Will get cardiac MRI to look for signs of myocarditis or infiltrative disease. Will send serum/urine immunofixation and iron studies.   Continue lovenox/coumadin overlap until INR therapeutic with LV thrombus.   Marca Ancona 06/17/2012 8:48 AM

## 2012-06-17 NOTE — Progress Notes (Signed)
CARE MANAGEMENT NOTE 06/17/2012  Patient:  Margaret Hess, Margaret Hess   Account Number:  [id number]  Date Initiated:  06/17/2012  Documentation initiated by:  Jovan Colligan  Subjective/Objective Assessment:   47 yr-old female adm with dx of new onset systolic failure/LV thrombus; lives alone, independent PTA     DC Planning Services  CM consult      Comments:  06/17/12 1400 Kaycen Whitworth RN MSN CCM Pt to d/c on Lovenox 80 mg SQ q 12 hr x 7 days.  Copay will be $100.  TC to pt's pharmacy, they will order Lovenox today and will have it in stock tomorrow.  Per pt, she feels comfortable with self-injections, has friends/family she can call on if needed.  Pt will be followed by [facility] when d/c'd.

## 2012-06-18 DIAGNOSIS — I5041 Acute combined systolic (congestive) and diastolic (congestive) heart failure: Principal | ICD-10-CM

## 2012-06-18 DIAGNOSIS — E119 Type 2 diabetes mellitus without complications: Secondary | ICD-10-CM

## 2012-06-18 LAB — CBC
HCT: 37.9 % (ref 36.0–46.0)
Hemoglobin: 13 g/dL (ref 12.0–15.0)
MCH: 30 pg (ref 26.0–34.0)
MCHC: 34.3 g/dL (ref 30.0–36.0)
MCV: 87.5 fL (ref 78.0–100.0)
RBC: 4.33 MIL/uL (ref 3.87–5.11)

## 2012-06-18 LAB — GLUCOSE, CAPILLARY
Glucose-Capillary: 141 mg/dL — ABNORMAL HIGH (ref 70–99)
Glucose-Capillary: 258 mg/dL — ABNORMAL HIGH (ref 70–99)
Glucose-Capillary: 146 mg/dL — ABNORMAL HIGH (ref 70–99)
Glucose-Capillary: 264 mg/dL — ABNORMAL HIGH (ref 70–99)

## 2012-06-18 LAB — BASIC METABOLIC PANEL WITH GFR
BUN: 14 mg/dL (ref 6–23)
CO2: 25 meq/L (ref 19–32)
Calcium: 8.7 mg/dL (ref 8.4–10.5)
Chloride: 102 meq/L (ref 96–112)
Creatinine, Ser: 0.56 mg/dL (ref 0.50–1.10)
GFR calc Af Amer: >90 mL/min (ref >90)
GFR calc non Af Amer: >90 mL/min (ref >90)
Glucose, Bld: 160 mg/dL — ABNORMAL HIGH (ref 70–99)
Potassium: 3.6 meq/L (ref 3.5–5.1)
Sodium: 139 meq/L (ref 135–145)

## 2012-06-18 LAB — PROTIME-INR: INR: 2.38 — ABNORMAL HIGH (ref 0.00–1.49)

## 2012-06-18 MED ORDER — PNEUMOCOCCAL VAC POLYVALENT 25 MCG/0.5ML IJ INJ
0.5000 mL | INJECTION | INTRAMUSCULAR | Status: DC
  Filled 2012-06-18: qty 0.5

## 2012-06-18 MED ORDER — SODIUM CHLORIDE 0.9 % IV SOLN
25.0000 mg | Freq: Once | INTRAVENOUS | Status: AC
  Administered 2012-06-18: 25 mg via INTRAVENOUS
  Filled 2012-06-18: qty 0.5

## 2012-06-18 MED ORDER — INFLUENZA VIRUS VACC SPLIT PF IM SUSP
0.5000 mL | INTRAMUSCULAR | Status: DC
  Filled 2012-06-18: qty 0.5

## 2012-06-18 MED ORDER — CARVEDILOL 3.125 MG PO TABS
3.1250 mg | ORAL_TABLET | Freq: Two times a day (BID) | ORAL | Status: DC
  Administered 2012-06-18 – 2012-06-19 (×2): 3.125 mg via ORAL
  Filled 2012-06-18 (×4): qty 1

## 2012-06-18 MED ORDER — SODIUM CHLORIDE 0.9 % IV SOLN
1000.0000 mg | Freq: Once | INTRAVENOUS | Status: AC
  Administered 2012-06-18: 1000 mg via INTRAVENOUS
  Filled 2012-06-18 (×2): qty 20

## 2012-06-18 NOTE — Progress Notes (Signed)
Patient ID: Margaret Hess, female   DOB: 04/10/1966, 46 y.o.   MRN: [id number] Advanced Heart Failure Rounding Note   Subjective:    46 yo. With history of DB, HTN, and anxiety presents with new onset systolic heart failure, EF <20%.  LV thrombus noted.  Grade 3 diastolic dysfunction.  Mod TR  Cath 06/16/12: RA = 4  RV = 39/2/6  PA = 40/23/31  PCW = 19 (v = 23)  Fick cardiac output/index = 3.7/2.0  Thermo CO/CI = 3.3/1.7  PVR = 3.5 Woods  FA sat = 99%  PA sat = 62%, 67%  Ao Pressure: 106/80 (93)  Did not cross Aov due to presence of LV thrombus on echo.   Left main: Normal  LAD: Normal  LCX: Normal  RCA: Normal  Doing well today, walking in room with no dyspnea.  INR therapeutic.    Objective:     Vital Signs:   Temp:  [97.6 F (36.4 C)-98.4 F (36.9 C)] 98 F (36.7 C) (10/17 0435) Pulse Rate:  [103-121] 103  (10/17 0435) Resp:  [18-21] 18  (10/17 0435) BP: (105-124)/(74-86) 119/83 mmHg (10/17 0435) SpO2:  [97 %-100 %] 97 % (10/17 0435) Weight:  [176 lb 9.6 oz (80.105 kg)] 176 lb 9.6 oz (80.105 kg) (10/17 0435) Last BM Date: 06/15/12  Weight change: Filed Weights   06/15/12 2058 06/16/12 1444 06/18/12 0435  Weight: 181 lb (82.1 kg) 176 lb 12.9 oz (80.2 kg) 176 lb 9.6 oz (80.105 kg)    Intake/Output:   Intake/Output Summary (Last 24 hours) at 06/18/12 0806 Last data filed at 06/18/12 0434  Gross per 24 hour  Intake    753 ml  Output   1250 ml  Net   -497 ml     Physical Exam:            General:  Well appearing. No resp difficulty HEENT: normal Neck: supple. JVP appears flat.  Carotids 2+ bilat; no bruits. No lymphadenopathy or thryomegaly appreciated. Cor: PMI nondisplaced. Tachycardic No gallops, rubs, or murmurs. Lungs: clear Abdomen: soft, nontender, nondistended. No hepatosplenomegaly. No bruits or masses. Good bowel sounds. Extremities: no cyanosis, clubbing, rash, edema Neuro: alert & orientedx3, cranial nerves grossly intact. moves all 4  extremities w/o difficulty. Affect pleasant     Telemetry: Sinus tach 110-120s  Labs: Basic Metabolic Panel:  Lab 06/18/12 4132 06/17/12 0455 06/16/12 0515 06/15/12 2248 06/15/12 1720  NA 139 138 135 -- 135  K 3.6 3.3* 3.6 -- 4.8  CL 102 100 97 -- 96  CO2 25 25 27  -- 19  GLUCOSE 160* 153* 155* -- 225*  BUN 14 15 20  -- 23  CREATININE 0.56 0.60 0.64 0.60 0.70  CALCIUM 8.7 8.2* 8.8 -- --  MG -- -- -- -- --  PHOS -- -- -- -- --    Liver Function Tests:  Lab 06/17/12 0455 06/15/12 1720  AST 149* 522*  ALT 348* 639*  ALKPHOS 51 54  BILITOT 0.5 1.1  PROT 5.5* 6.7  ALBUMIN 2.2* 2.8*   No results found for this basename: LIPASE:5,AMYLASE:5 in the last 168 hours No results found for this basename: AMMONIA:3 in the last 168 hours  CBC:  Lab 06/18/12 0415 06/17/12 0455 06/15/12 2248 06/15/12 1720  WBC 8.5 9.3 14.3* 11.8*  NEUTROABS -- -- -- 5.6  HGB 13.0 13.3 14.5 14.3  HCT 37.9 39.0 42.4 41.9  MCV 87.5 86.5 86.7 86.4  PLT 207 174 186 211    Cardiac Enzymes:  Lab 06/16/12 1034 06/16/12 0515 06/15/12 2249 06/15/12 1929  CKTOTAL -- -- -- 47  CKMB -- -- -- 2.7  CKMBINDEX -- -- -- --  TROPONINI <0.30 <0.30 <0.30 <0.30    BNP: BNP (last 3 results)  Basename 06/17/12 0455 06/15/12 1720  PROBNP 1930.0* 6932.0*       Imaging: Mr Card Morphology Wo/w Cm  06/17/2012  *RADIOLOGY REPORT*  Clinical Data: Cardiomyopathy, evaluate for myocarditis, infiltrative disease.  MR CARDIA MORPHOLOGY WITHOUT AND WITH CONTRAST  GE 1.5 T magnet with dedicated cardiac coil.  FIESTA sequences for function and morphology.  10 minutes after 25 mL Multihance contrast was injected, inversion recovery sequences were done to assess for myocardial delayed enhancement. EF was calculated at a dedicated workstation.  Contrast: 25mL MULTIHANCE GADOBENATE DIMEGLUMINE 529 MG/ML IV SOLN  Comparison: None.  Findings: Mildly dilated left ventricle with severe systolic dysfunction, EF 20%. Global  hypokinesis.  There was prominent trabeculation predominantly along the anterior wall, with diastolic noncompacted/compacted ratio = 2.  There was a small thrombus along the apical anterior wall. There was mild to moderate left atrial enlargement.  Normal right ventricular size with mildly decreased systolic function.  Mild right atrial enlargement.  There was moderate tricuspid regurgitation and probably mild mitral regurgitation.  No aortic insufficiency or stenosis.  On delayed enhancement imaging, there was no myocardial delayed enhancement.  Measurements:  LV EDV 192 mL  LV SV 39 mL  LV EF 20.4%  IMPRESSION: 1. Mildly dilated LV with severe systolic dysfunction, EF 20%, with global hypokinesis.  2. Small LV thrombus along the apical anterior wall.  3. 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  However, other walls do not show as prominent trabeculation.  The noncompacted/compacted ratio in diastole along the anterior wall is of borderline significance (2).  4. Normal RV size with mildly decreased systolic function.  5. No myocardial delayed enhancement.  Therefore, no definitive evidence for prior MI, myocarditis or infiltrative disease.   Original Report Authenticated By: [email or web address]      Medications:     Scheduled Medications:    . buPROPion  75 mg Oral Daily  . carvedilol  3.125 mg Oral BID WC  . digoxin  0.25 mg Oral Daily  . DULoxetine  20 mg Oral Daily  . enoxaparin (LOVENOX) injection  80 mg Subcutaneous Q12H  . furosemide  20 mg Oral Daily  . gadobenate dimeglumine  25 mL Intravenous Once  . insulin aspart  0-15 Units Subcutaneous TID WC  . lisinopril  10 mg Oral BID  . potassium chloride  20 mEq Oral BID  . sodium chloride  3 mL Intravenous Q12H  . spironolactone  25 mg Oral Daily  . warfarin  7.5 mg Oral ONCE-1800  . Warfarin - Pharmacist Dosing Inpatient   Does not apply q1800  . DISCONTD: aspirin EC  81 mg Oral Daily  . DISCONTD: lisinopril  10  mg Oral Daily    Infusions:    . DISCONTD: heparin 1,100 Units/hr (06/17/12 0142)    PRN Medications: sodium chloride, acetaminophen, acetaminophen, LORazepam, ondansetron (ZOFRAN) IV, ondansetron (ZOFRAN) IV, sodium chloride   Assessment:   1. 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 NICM, EF <20% 2. LV thrombus  - on heparin  3. Transaminitis  4. DM  5. Anxiety  6. Hypertension  Plan/Discussion:    1. Acute/chronic systolic CHF: Appears euvolemic.  Has not walked much.  MRI yesterday confirmed LV thrombus.  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  - Ambulate halls. - Add Coreg 3.125 mg bid - Continue other po CHF meds.  Will need digoxin level at followup.  Pending Fe studies, immunofixation.  2. LV thrombus: INR therapeutic today.  Overlap Lovenox today and discontinue tomorrow.  3. Disposition: Plan home tomorrow.   Marca Ancona 06/18/2012 8:06 AM

## 2012-06-18 NOTE — Progress Notes (Signed)
PT Cancellation/Discharge Note  Patient Details Name: Margaret Hess MRN: [id number] DOB: 04/17/1966   Cancelled Treatment:    Reason Eval/Treat Not Completed: Other (comment) (No Physical Therapy indicated - patient declined PT services)  Ambulating independently in hallway with nursing.   Vena Austria 06/18/2012, 4:37 PM [phone]

## 2012-06-18 NOTE — Plan of Care (Signed)
Problem: Problem: Diabetes Management Progression Goal: INCREASE DIABETES KNOWLEDGE Outcome: Completed/Met Date Met:  06/18/12 Went over handouts with pt. Including : Review of Diabetes Type 2 , Diabetes Meal Planning Guide, 1800 Cal Diet for Diabetes Meal Planning &  Food Labeling for Diabetes.

## 2012-06-18 NOTE — Progress Notes (Signed)
Patient seen and examined by me. See note by Toya Smothers.  Agree with plan of probable d/c tomm-  Will need home lovenox to bridge.    Marlin Canary DO

## 2012-06-18 NOTE — Progress Notes (Signed)
I cosign for Margaret Hess's assessment, med administration, notes, I/O, and care plan/education.  

## 2012-06-18 NOTE — Progress Notes (Signed)
[facility] PROGRESS NOTE  Margaret Hess WUJ:[id number] DOB: 06/11/1966 DOA: 06/15/2012 PCP: Cala Bradford, MD  Assessment/Plan: Acute respiratory failure with hypoxia due to :  A) Acute systolic CHF (congestive heart failure), NYHA class 4 - EF <15% per cath - NICM  B) Moderate Bilateral pleural effusions  C) Grade 3 acute diastolic dysfunction with heart failure  *Acute severe systolic dysfunction due to NICM - RH/LH cath negative  *cardiology assistance appreciated. Coreg started 06/18/12  *Continue Lasix 20 mg PO daily with spironolactone  *Cards titrateing ACE I for afterload reduction  *Continue digoxin   *MRI yields LV thrombus. Iron 29, ferritin 90.    LV (left ventricular) mural thrombus without MI  *Continue Lovenox and Coumadin (heparin discontinued 06/17/12 *Case manager aware of need for home Lovenox bridge  Chronic passive hepatic congestion  *Secondary to acute heart failure  *LFTs trending down after diuresis  Hypokalemia  * Continue oral replete . resolved Mild Pulmonary hypertension  *Suspect acute and related to the severity of the patient's systolic and diastolic heart failure  Diabetes mellitus  *Victoza and metformin on hold during acute illness especially with associated hepatic congestion  *Continue sliding scale insulin  *Hemoglobin A1c 7.0 CBG's 221, 141 Sub optimal HDL  *Will need statin added once LFT's back to normal  HTN (hypertension)  *Current blood pressure well controlled and was not on medications prior to admission  Anxiety  *Continue Wellbutrin and Cymbalta . Stable Leukocytosis  *Likely reactive due to acute illness with as well as LV thrombus . resolved    Code Status: full Family Communication: patient at bedside Disposition Plan: Home likely tomorrow   Consultants:  Cardiology  Procedures:  06/16/12 right and left heart cath with selectie coronary angiography  Antibiotics:  none  HPI/Subjective: Sitting up  in bed watching TV. Denies pain/discomfort. Reports she is looking forward to going home tomorrow  Objective: Filed Vitals:   06/17/12 0823 06/17/12 1500 06/17/12 2105 06/18/12 0435  BP: 124/86 105/74 121/83 119/83  Pulse: 114 108 121 103  Temp: 98.4 F (36.9 C) 98 F (36.7 C) 97.6 F (36.4 C) 98 F (36.7 C)  TempSrc: Oral Oral Oral Oral  Resp: 21 18 18 18   Height:      Weight:    80.105 kg (176 lb 9.6 oz)  SpO2: 100% 98% 97% 97%    Intake/Output Summary (Last 24 hours) at 06/18/12 0840 Last data filed at 06/18/12 0434  Gross per 24 hour  Intake    753 ml  Output   1250 ml  Net   -497 ml   Filed Weights   06/15/12 2058 06/16/12 1444 06/18/12 0435  Weight: 82.1 kg (181 lb) 80.2 kg (176 lb 12.9 oz) 80.105 kg (176 lb 9.6 oz)    Exam:   General:  Awake alert NAD  Cardiovascular: RRR No MGR No LEE PPP  Respiratory: normal effort. BSCTAB No crackles, rhonchi wheeze  Abdomen: obese, soft +BS non-tender to palpation No mass  Data Reviewed: Basic Metabolic Panel:  Lab 06/18/12 9562 06/17/12 0455 06/16/12 0515 06/15/12 2248 06/15/12 1720  NA 139 138 135 -- 135  K 3.6 3.3* 3.6 -- 4.8  CL 102 100 97 -- 96  CO2 25 25 27  -- 19  GLUCOSE 160* 153* 155* -- 225*  BUN 14 15 20  -- 23  CREATININE 0.56 0.60 0.64 0.60 0.70  CALCIUM 8.7 8.2* 8.8 -- 9.1  MG -- -- -- -- --  PHOS -- -- -- -- --  Liver Function Tests:  Lab 06/17/12 0455 06/15/12 1720  AST 149* 522*  ALT 348* 639*  ALKPHOS 51 54  BILITOT 0.5 1.1  PROT 5.5* 6.7  ALBUMIN 2.2* 2.8*   No results found for this basename: LIPASE:5,AMYLASE:5 in the last 168 hours No results found for this basename: AMMONIA:5 in the last 168 hours CBC:  Lab 06/18/12 0415 06/17/12 0455 06/15/12 2248 06/15/12 1720  WBC 8.5 9.3 14.3* 11.8*  NEUTROABS -- -- -- 5.6  HGB 13.0 13.3 14.5 14.3  HCT 37.9 39.0 42.4 41.9  MCV 87.5 86.5 86.7 86.4  PLT 207 174 186 211   Cardiac Enzymes:  Lab 06/16/12 1034 06/16/12 0515 06/15/12 2249  06/15/12 1929  CKTOTAL -- -- -- 47  CKMB -- -- -- 2.7  CKMBINDEX -- -- -- --  TROPONINI <0.30 <0.30 <0.30 <0.30   BNP (last 3 results)  Basename 06/17/12 0455 06/15/12 1720  PROBNP 1930.0* 6932.0*   CBG:  Lab 06/18/12 0546 06/17/12 2104 06/17/12 1624 06/17/12 1240 06/17/12 0752  GLUCAP 141* 221* 168* 158* 115*    No results found for this or any previous visit (from the past 240 hour(s)).   Studies: Mr Card Morphology Wo/w Cm  06/17/2012  *RADIOLOGY REPORT*  Clinical Data: Cardiomyopathy, evaluate for myocarditis, infiltrative disease.  MR CARDIA MORPHOLOGY WITHOUT AND WITH CONTRAST  GE 1.5 T magnet with dedicated cardiac coil.  FIESTA sequences for function and morphology.  10 minutes after 25 mL Multihance contrast was injected, inversion recovery sequences were done to assess for myocardial delayed enhancement. EF was calculated at a dedicated workstation.  Contrast: 25mL MULTIHANCE GADOBENATE DIMEGLUMINE 529 MG/ML IV SOLN  Comparison: None.  Findings: Mildly dilated left ventricle with severe systolic dysfunction, EF 20%. Global hypokinesis.  There was prominent trabeculation predominantly along the anterior wall, with diastolic noncompacted/compacted ratio = 2.  There was a small thrombus along the apical anterior wall. There was mild to moderate left atrial enlargement.  Normal right ventricular size with mildly decreased systolic function.  Mild right atrial enlargement.  There was moderate tricuspid regurgitation and probably mild mitral regurgitation.  No aortic insufficiency or stenosis.  On delayed enhancement imaging, there was no myocardial delayed enhancement.  Measurements:  LV EDV 192 mL  LV SV 39 mL  LV EF 20.4%  IMPRESSION: 1. Mildly dilated LV with severe systolic dysfunction, EF 20%, with global hypokinesis.  2. Small LV thrombus along the apical anterior wall.  3. 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  However, other walls do not show  as prominent trabeculation.  The noncompacted/compacted ratio in diastole along the anterior wall is of borderline significance (2).  4. Normal RV size with mildly decreased systolic function.  5. No myocardial delayed enhancement.  Therefore, no definitive evidence for prior MI, myocarditis or infiltrative disease.   Original Report Authenticated By: [email or web address]     Scheduled Meds:   . buPROPion  75 mg Oral Daily  . carvedilol  3.125 mg Oral BID WC  . digoxin  0.25 mg Oral Daily  . DULoxetine  20 mg Oral Daily  . enoxaparin (LOVENOX) injection  80 mg Subcutaneous Q12H  . furosemide  20 mg Oral Daily  . gadobenate dimeglumine  25 mL Intravenous Once  . insulin aspart  0-15 Units Subcutaneous TID WC  . lisinopril  10 mg Oral BID  . potassium chloride  20 mEq Oral BID  . sodium chloride  3 mL Intravenous Q12H  . spironolactone  25  mg Oral Daily  . warfarin  7.5 mg Oral ONCE-1800  . Warfarin - Pharmacist Dosing Inpatient   Does not apply q1800  . DISCONTD: aspirin EC  81 mg Oral Daily  . DISCONTD: lisinopril  10 mg Oral Daily   Continuous Infusions:   . DISCONTD: heparin 1,100 Units/hr (06/17/12 0142)    Principal Problem:  *Acute systolic CHF (congestive heart failure), NYHA class 4 Active Problems:  HTN (hypertension)  Anxiety  Moderate Bilateral pleural effusion  Dyspnea on exertion  LV (left ventricular) mural thrombus without MI  Chronic passive hepatic congestion  Acute respiratory failure with hypoxia  Positive D dimer  Abnormal coagulation profile  Diabetes mellitus  Leukocytosis  Pulmonary hypertension  Acute diastolic heart failure, NYHA class 3  Acute combined systolic and diastolic heart failure  Nonischemic cardiomyopathy    Time spent: 35 minutes    [facility] M NP [facility]  If 8PM-8AM, please contact night-coverage at [email or web address], password [facility] 06/18/2012, 8:40 AM  LOS: 3 days

## 2012-06-18 NOTE — Progress Notes (Signed)
Pt ambulated down the hallway from one end of the unit to the other and back to the room.  Pt 02 sats remained between 94-96% on RA.  Pt denied any lightheadedness, dizziness, or SOB.  Will continue to monitor.   Nino Glow RN

## 2012-06-18 NOTE — Progress Notes (Signed)
ANTICOAGULATION CONSULT NOTE - Follow Up Consult  Pharmacy Consult for Lovenox + Coumadin Indication: LV thrombus  No Known Allergies  Patient Measurements: Height: 5\' 5"  (165.1 cm) Weight: 176 lb 9.6 oz (80.105 kg) (scale B) IBW/kg (Calculated) : 57   Vital Signs: Temp: 98 F (36.7 C) (10/17 0435) Temp src: Oral (10/17 0435) BP: 119/83 mmHg (10/17 0435) Pulse Rate: 103  (10/17 0435)  Labs:  Basename 06/18/12 0415 06/17/12 0715 06/17/12 0455 06/16/12 1807 06/16/12 1034 06/16/12 0515 06/15/12 2249 06/15/12 2248 06/15/12 1929 06/15/12 1720  HGB 13.0 -- 13.3 -- -- -- -- -- -- --  HCT 37.9 -- 39.0 -- -- -- -- 42.4 -- --  PLT 207 -- 174 -- -- -- -- 186 -- --  APTT -- -- -- -- -- -- -- -- -- 28  LABPROT 24.9* -- 15.8* -- -- -- -- -- -- 18.7*  INR 2.38* -- 1.29 -- -- -- -- -- -- 1.62*  HEPARINUNFRC -- 0.15* -- 0.16* -- -- -- -- -- --  CREATININE 0.56 -- 0.60 -- -- 0.64 -- -- -- --  CKTOTAL -- -- -- -- -- -- -- -- 47 --  CKMB -- -- -- -- -- -- -- -- 2.7 --  TROPONINI -- -- -- -- <0.30 <0.30 <0.30 -- -- --    Estimated Creatinine Clearance: 92.8 ml/min (by C-G formula based on Cr of 0.56).  Assessment: 45yof continues on coumadin with lovenox bridge for LV thrombus in the setting of new onset heart failure.  INR has jumped to 2.28 and is therapeutic after just 2 doses of coumadin. Lovenox dosing appropriate for renal function. Dr. Shirlee Latch wants to continue lovenox for one more day. No bleeding noted. CBC is stable.   Goal of Therapy:  INR 2-3 Anti-Xa 0.6-1.2 Monitor platelets by anticoagulation protocol: Yes   Plan:  1) No coumadin tonight 2) Continue lovenox 80mg  sq q12 3) Follow up INR, CBC in the morning  Fredrik Rigger 06/18/2012,9:08 AM

## 2012-06-19 ENCOUNTER — Inpatient Hospital Stay (HOSPITAL_COMMUNITY)
Admission: EM | Admit: 2012-06-19 | Discharge: 2012-06-25 | DRG: 533 | Disposition: A | Discharge status: Rehabilitation Facility | Payer: BC Managed Care – PPO | Attending: Internal Medicine | Admitting: Internal Medicine

## 2012-06-19 ENCOUNTER — Emergency Department (HOSPITAL_COMMUNITY): Payer: BC Managed Care – PPO

## 2012-06-19 ENCOUNTER — Telehealth: Payer: Self-pay | Admitting: Physician Assistant

## 2012-06-19 DIAGNOSIS — E1165 Type 2 diabetes mellitus with hyperglycemia: Secondary | ICD-10-CM | POA: Diagnosis present

## 2012-06-19 DIAGNOSIS — I10 Essential (primary) hypertension: Secondary | ICD-10-CM | POA: Diagnosis present

## 2012-06-19 DIAGNOSIS — I6340 Cerebral infarction due to embolism of unspecified cerebral artery: Principal | ICD-10-CM | POA: Diagnosis present

## 2012-06-19 DIAGNOSIS — F419 Anxiety disorder, unspecified: Secondary | ICD-10-CM

## 2012-06-19 DIAGNOSIS — I119 Hypertensive heart disease without heart failure: Secondary | ICD-10-CM | POA: Diagnosis present

## 2012-06-19 DIAGNOSIS — I509 Heart failure, unspecified: Secondary | ICD-10-CM | POA: Diagnosis present

## 2012-06-19 DIAGNOSIS — I5042 Chronic combined systolic (congestive) and diastolic (congestive) heart failure: Secondary | ICD-10-CM | POA: Diagnosis present

## 2012-06-19 DIAGNOSIS — J9601 Acute respiratory failure with hypoxia: Secondary | ICD-10-CM

## 2012-06-19 DIAGNOSIS — I5022 Chronic systolic (congestive) heart failure: Secondary | ICD-10-CM

## 2012-06-19 DIAGNOSIS — I240 Acute coronary thrombosis not resulting in myocardial infarction: Secondary | ICD-10-CM

## 2012-06-19 DIAGNOSIS — G43909 Migraine, unspecified, not intractable, without status migrainosus: Secondary | ICD-10-CM | POA: Diagnosis present

## 2012-06-19 DIAGNOSIS — G8100 Flaccid hemiplegia affecting unspecified side: Secondary | ICD-10-CM | POA: Diagnosis present

## 2012-06-19 DIAGNOSIS — R791 Abnormal coagulation profile: Secondary | ICD-10-CM

## 2012-06-19 DIAGNOSIS — I429 Cardiomyopathy, unspecified: Secondary | ICD-10-CM

## 2012-06-19 DIAGNOSIS — R4701 Aphasia: Secondary | ICD-10-CM | POA: Diagnosis present

## 2012-06-19 DIAGNOSIS — I639 Cerebral infarction, unspecified: Secondary | ICD-10-CM

## 2012-06-19 DIAGNOSIS — I5041 Acute combined systolic (congestive) and diastolic (congestive) heart failure: Secondary | ICD-10-CM

## 2012-06-19 DIAGNOSIS — E119 Type 2 diabetes mellitus without complications: Secondary | ICD-10-CM

## 2012-06-19 DIAGNOSIS — I5189 Other ill-defined heart diseases: Secondary | ICD-10-CM

## 2012-06-19 DIAGNOSIS — I5031 Acute diastolic (congestive) heart failure: Secondary | ICD-10-CM

## 2012-06-19 DIAGNOSIS — I428 Other cardiomyopathies: Secondary | ICD-10-CM | POA: Diagnosis present

## 2012-06-19 LAB — BASIC METABOLIC PANEL
BUN: 13 mg/dL (ref 6–23)
Chloride: 100 mEq/L (ref 96–112)
GFR calc Af Amer: >90 mL/min (ref >90)
GFR calc non Af Amer: >90 mL/min (ref >90)
Potassium: 4.7 mEq/L (ref 3.5–5.1)
Sodium: 137 mEq/L (ref 135–145)

## 2012-06-19 LAB — CBC
HCT: 39.4 % (ref 36.0–46.0)
MCHC: 33.5 g/dL (ref 30.0–36.0)
MCHC: 34.4 g/dL (ref 30.0–36.0)
Platelets: 206 10*3/uL (ref 150–400)
Platelets: 217 10*3/uL (ref 150–400)
RDW: 12.9 % (ref 11.5–15.5)
RDW: 13.1 % (ref 11.5–15.5)
WBC: 10.1 10*3/uL (ref 4.0–10.5)
WBC: 11.1 10*3/uL — ABNORMAL HIGH (ref 4.0–10.5)

## 2012-06-19 LAB — PROTEIN ELECTROPH W RFLX QUANT IMMUNOGLOBULINS
Alpha-2-Globulin: 14.4 % — ABNORMAL HIGH (ref 7.1–11.8)
Beta Globulin: 8.5 % — ABNORMAL HIGH (ref 4.7–7.2)
Gamma Globulin: 14.6 % (ref 11.1–18.8)
M-Spike, %: NOT DETECTED g/dL
Total Protein ELP: 5.1 g/dL — ABNORMAL LOW (ref 6.0–8.3)

## 2012-06-19 LAB — POCT I-STAT, CHEM 8
Calcium, Ion: 1.14 mmol/L (ref 1.12–1.23)
Creatinine, Ser: 0.8 mg/dL (ref 0.50–1.10)
Glucose, Bld: 267 mg/dL — ABNORMAL HIGH (ref 70–99)
HCT: 40 % (ref 36.0–46.0)
Hemoglobin: 13.6 g/dL (ref 12.0–15.0)
Potassium: 4.7 mEq/L (ref 3.5–5.1)

## 2012-06-19 LAB — PROTIME-INR
INR: 2.15 — ABNORMAL HIGH (ref 0.00–1.49)
Prothrombin Time: 23.1 seconds — ABNORMAL HIGH (ref 11.6–15.2)

## 2012-06-19 LAB — DIFFERENTIAL
Basophils Absolute: 0 10*3/uL (ref 0.0–0.1)
Basophils Relative: 0 % (ref 0–1)
Lymphocytes Relative: 38 % (ref 12–46)
Monocytes Absolute: 0.6 10*3/uL (ref 0.1–1.0)
Neutro Abs: 5.4 10*3/uL (ref 1.7–7.7)

## 2012-06-19 LAB — POCT I-STAT TROPONIN I: Troponin i, poc: 0.04 ng/mL (ref 0.00–0.08)

## 2012-06-19 LAB — GLUCOSE, CAPILLARY: Glucose-Capillary: 247 mg/dL — ABNORMAL HIGH (ref 70–99)

## 2012-06-19 MED ORDER — LISINOPRIL 10 MG PO TABS: 10.0000 mg | ORAL_TABLET | Freq: Two times a day (BID) | ORAL | Status: DC

## 2012-06-19 MED ORDER — FUROSEMIDE 20 MG PO TABS: 20.0000 mg | ORAL_TABLET | Freq: Every day | ORAL | Status: DC

## 2012-06-19 MED ORDER — WARFARIN SODIUM 5 MG PO TABS: 5.0000 mg | ORAL_TABLET | Freq: Every day | ORAL | Status: DC

## 2012-06-19 MED ORDER — WARFARIN SODIUM 5 MG PO TABS
5.0000 mg | ORAL_TABLET | ORAL | Status: AC
  Administered 2012-06-19: 5 mg via ORAL
  Filled 2012-06-19: qty 1

## 2012-06-19 MED ORDER — POTASSIUM CHLORIDE CRYS ER 20 MEQ PO TBCR: 20.0000 meq | EXTENDED_RELEASE_TABLET | Freq: Two times a day (BID) | ORAL | Status: DC

## 2012-06-19 MED ORDER — ENOXAPARIN SODIUM 80 MG/0.8ML ~~LOC~~ SOLN: SUBCUTANEOUS | Status: DC

## 2012-06-19 MED ORDER — DOCUSATE SODIUM 100 MG PO CAPS: 100.0000 mg | ORAL_CAPSULE | Freq: Two times a day (BID) | ORAL | Status: DC

## 2012-06-19 MED ORDER — DIGOXIN 250 MCG PO TABS: 0.2500 mg | ORAL_TABLET | Freq: Every day | ORAL | Status: DC

## 2012-06-19 MED ORDER — FERROUS SULFATE 325 (65 FE) MG PO TABS: 325.0000 mg | ORAL_TABLET | Freq: Two times a day (BID) | ORAL | Status: DC

## 2012-06-19 MED ORDER — SPIRONOLACTONE 25 MG PO TABS: 25.0000 mg | ORAL_TABLET | Freq: Every day | ORAL | Status: DC

## 2012-06-19 MED ORDER — CARVEDILOL 3.125 MG PO TABS: 3.1250 mg | ORAL_TABLET | Freq: Two times a day (BID) | ORAL | Status: DC

## 2012-06-19 MED ORDER — SODIUM CHLORIDE 0.9 % IV SOLN
INTRAVENOUS | Status: DC
  Administered 2012-06-20: 06:00:00 via INTRAVENOUS

## 2012-06-19 NOTE — Discharge Summary (Signed)
Patient seen and examined by me. Plan to d/c patient with strict follow up by heart failure team.  Marlin Canary DO

## 2012-06-19 NOTE — Progress Notes (Addendum)
Advanced Heart Failure Rounding Note   Subjective:    46 yo. With history of DB, HTN, and anxiety presents with new onset systolic heart failure, EF <20%.  LV thrombus noted.  Grade 3 diastolic dysfunction.  Mod TR  Cath 06/16/12: RA = 4  RV = 39/2/6  PA = 40/23/31  PCW = 19 (v = 23)  Fick cardiac output/index = 3.7/2.0  Thermo CO/CI = 3.3/1.7  PVR = 3.5 [place]  FA sat = 99%  PA sat = 62%, 67%  Ao Pressure: 106/80 (93)  Did not cross Aov due to presence of LV thrombus on echo.   Left main: Normal  LAD: Normal  LCX: Normal  RCA: Normal  Doing well today, walking in halls without difficulty.  INR therapeutic.    Objective:     Vital Signs:   Temp:  [98 F (36.7 C)-98.4 F (36.9 C)] 98.4 F (36.9 C) (10/18 0543) Pulse Rate:  [100-113] 102  (10/18 0543) Resp:  [18] 18  (10/18 0543) BP: (106-117)/(73-80) 110/80 mmHg (10/18 0543) SpO2:  [98 %-100 %] 100 % (10/18 0543) Weight:  [80.876 kg (178 lb 4.8 oz)] 80.876 kg (178 lb 4.8 oz) (10/18 0543) Last BM Date: 06/15/12  Weight change: Filed Weights   06/16/12 1444 06/18/12 0435 06/19/12 0543  Weight: 80.2 kg (176 lb 12.9 oz) 80.105 kg (176 lb 9.6 oz) 80.876 kg (178 lb 4.8 oz)    Intake/Output:   Intake/Output Summary (Last 24 hours) at 06/19/12 0839 Last data filed at 06/19/12 0000  Gross per 24 hour  Intake    840 ml  Output   1350 ml  Net   -510 ml     Physical Exam:            General:  Well appearing. No resp difficulty HEENT: normal Neck: supple. JVP appears flat.  Carotids 2+ bilat; no bruits. No lymphadenopathy or thryomegaly appreciated. Cor: PMI nondisplaced. Tachycardic No gallops, rubs, or murmurs. Lungs: clear Abdomen: soft, nontender, nondistended. No hepatosplenomegaly. No bruits or masses. Good bowel sounds. Extremities: no cyanosis, clubbing, rash, edema Neuro: alert & orientedx3, cranial nerves grossly intact. moves all 4 extremities w/o difficulty. Affect pleasant     Telemetry: Sinus tach  100s  Labs: Basic Metabolic Panel:  Lab 06/19/12 8119 06/18/12 0415 06/17/12 0455 06/16/12 0515 06/15/12 2248 06/15/12 1720  NA 137 139 138 135 -- 135  K 4.7 3.6 3.3* 3.6 -- 4.8  CL 100 102 100 97 -- 96  CO2 26 25 25 27  -- 19  GLUCOSE 138* 160* 153* 155* -- 225*  BUN 13 14 15 20  -- 23  CREATININE 0.61 0.56 0.60 0.64 0.60 --  CALCIUM 9.1 8.7 8.2* -- -- --  MG -- -- -- -- -- --  PHOS -- -- -- -- -- --    Liver Function Tests:  Lab 06/17/12 0455 06/15/12 1720  AST 149* 522*  ALT 348* 639*  ALKPHOS 51 54  BILITOT 0.5 1.1  PROT 5.5* 6.7  ALBUMIN 2.2* 2.8*   No results found for this basename: LIPASE:5,AMYLASE:5 in the last 168 hours No results found for this basename: AMMONIA:3 in the last 168 hours  CBC:  Lab 06/19/12 0610 06/18/12 0415 06/17/12 0455 06/15/12 2248 06/15/12 1720  WBC 11.1* 8.5 9.3 14.3* 11.8*  NEUTROABS -- -- -- -- 5.6  HGB 13.2 13.0 13.3 14.5 14.3  HCT 39.4 37.9 39.0 42.4 41.9  MCV 88.3 87.5 86.5 86.7 86.4  PLT 206 207 174 186  211    Cardiac Enzymes:  Lab 06/16/12 1034 06/16/12 0515 06/15/12 2249 06/15/12 1929  CKTOTAL -- -- -- 47  CKMB -- -- -- 2.7  CKMBINDEX -- -- -- --  TROPONINI <0.30 <0.30 <0.30 <0.30    BNP: BNP (last 3 results)  Basename 06/17/12 0455 06/15/12 1720  PROBNP 1930.0* 6932.0*       Imaging: Mr Card Morphology Wo/w Cm  06/17/2012  *RADIOLOGY REPORT*  Clinical Data: Cardiomyopathy, evaluate for myocarditis, infiltrative disease.  MR CARDIA MORPHOLOGY WITHOUT AND WITH CONTRAST  GE 1.5 T magnet with dedicated cardiac coil.  FIESTA sequences for function and morphology.  10 minutes after 25 mL Multihance contrast was injected, inversion recovery sequences were done to assess for myocardial delayed enhancement. EF was calculated at a dedicated workstation.  Contrast: 25mL MULTIHANCE GADOBENATE DIMEGLUMINE 529 MG/ML IV SOLN  Comparison: None.  Findings: Mildly dilated left ventricle with severe systolic dysfunction, EF 20%.  Global hypokinesis.  There was prominent trabeculation predominantly along the anterior wall, with diastolic noncompacted/compacted ratio = 2.  There was a small thrombus along the apical anterior wall. There was mild to moderate left atrial enlargement.  Normal right ventricular size with mildly decreased systolic function.  Mild right atrial enlargement.  There was moderate tricuspid regurgitation and probably mild mitral regurgitation.  No aortic insufficiency or stenosis.  On delayed enhancement imaging, there was no myocardial delayed enhancement.  Measurements:  LV EDV 192 mL  LV SV 39 mL  LV EF 20.4%  IMPRESSION: 1. Mildly dilated LV with severe systolic dysfunction, EF 20%, with global hypokinesis.  2. Small LV thrombus along the apical anterior wall.  3. 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  However, other walls do not show as prominent trabeculation.  The noncompacted/compacted ratio in diastole along the anterior wall is of borderline significance (2).  4. Normal RV size with mildly decreased systolic function.  5. No myocardial delayed enhancement.  Therefore, no definitive evidence for prior MI, myocarditis or infiltrative disease.   Original Report Authenticated By: [email or web address]      Medications:     Scheduled Medications:    . buPROPion  75 mg Oral Daily  . carvedilol  3.125 mg Oral BID WC  . digoxin  0.25 mg Oral Daily  . DULoxetine  20 mg Oral Daily  . enoxaparin (LOVENOX) injection  80 mg Subcutaneous Q12H  . furosemide  20 mg Oral Daily  . influenza  inactive virus vaccine  0.5 mL Intramuscular Tomorrow-1000  . insulin aspart  0-15 Units Subcutaneous TID WC  . iron dextran (INFED/DEXFERRUM) infusion  25 mg Intravenous Once   Followed by  . iron dextran (INFED/DEXFERRUM) infusion  1,000 mg Intravenous Once  . lisinopril  10 mg Oral BID  . pneumococcal 23 valent vaccine  0.5 mL Intramuscular Tomorrow-1000  . potassium chloride  20 mEq Oral  BID  . sodium chloride  3 mL Intravenous Q12H  . spironolactone  25 mg Oral Daily  . Warfarin - Pharmacist Dosing Inpatient   Does not apply q1800    Infusions:    PRN Medications: sodium chloride, acetaminophen, acetaminophen, LORazepam, ondansetron (ZOFRAN) IV, ondansetron (ZOFRAN) IV, sodium chloride   Assessment:   1. 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 NICM, EF <20% 2. LV thrombus  - on heparin  3. Transaminitis  4. DM  5. Anxiety  6. Hypertension  Plan/Discussion:    1. Acute/chronic systolic CHF: Appears euvolemic.  Ambulating without difficulty.  MRI confirmed LV thrombus.  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  - ok for discharge today - Discharge on:  Coreg 3.125 mg bid, lisinopril 10 Bid, spiro 25 daily, lasix 20 mg daily, digoxin 0.125 daily - Fe studies showed low Fe and saturation therefore received IV Fe yesterday start Collace 100 BID, Fe 325 mg BID, - Follow up immunofixation.  2. LV thrombus: INR therapeutic today.  No need for lovenox bridge.  Follow up with [facility] on Tuesday for repeat INR.  Discharge on coumadin 5 mg daily.   3. Disposition: Plan home today.  Follow up CHF clinic on Wednesday with digoxin level and BMET.   Robbi Garter, [facility] 06/19/2012 8:39 AM  Patient seen and examined with Ulyess Blossom, PA-C. We discussed all aspects of the encounter. I agree with the assessment and plan as stated above.   Agree with above. She looks good. INR therapeutic. On good meds. Can d/c home today. We will see back in clinic early next week.   Although INR therapeutic needs 3 day Lovenox overlap to cover half life of Factor II. Would also give kcl 20 to use only when she takes extra lasix. Reinforced need for daily weights and reviewed use of sliding scale diuretics.    Daniel Bensimhon,MD 10:38 AM

## 2012-06-19 NOTE — ED Notes (Signed)
Pt in from home via [facility], pt witnessed by sister to have L sided facial droop & slurred speech, Pt A&O x4, follows commands, pt reported to have slurred speech, pt discharged from hospital today, pt hospitalized x5 days for L ventricular blood clot

## 2012-06-19 NOTE — Progress Notes (Signed)
I cosign for Margaret Hess's assessment, med administration, notes, I/O, and care plan/education.  

## 2012-06-19 NOTE — Discharge Summary (Signed)
Physician Discharge Summary  Margaret Hess AOZ:[id number] DOB: May 31, 1966 DOA: 06/15/2012  PCP: Cala Bradford, MD  Admit date: 06/15/2012 Discharge date: 06/19/2012  Recommendations for Outpatient Follow-up:  1. Discharge to home. Follow up with HF clininc. INR check 06/23/12  Discharge Diagnoses:  Principal Problem:  *Acute systolic CHF (congestive heart failure), NYHA class 4 Active Problems:  HTN (hypertension)  Anxiety  Moderate Bilateral pleural effusion  Dyspnea on exertion  LV (left ventricular) mural thrombus without MI  Chronic passive hepatic congestion  Acute respiratory failure with hypoxia  Positive D dimer  Abnormal coagulation profile  Diabetes mellitus  Leukocytosis  Pulmonary hypertension  Acute diastolic heart failure, NYHA class 3  Acute combined systolic and diastolic heart failure  Nonischemic cardiomyopathy   Discharge Condition: medically stable and ready for discharge to home.   Diet recommendation: heart healthy low sodium  Filed Weights   06/16/12 1444 06/18/12 0435 06/19/12 0543  Weight: 80.2 kg (176 lb 12.9 oz) 80.105 kg (176 lb 9.6 oz) 80.876 kg (178 lb 4.8 oz)    History of present illness:  Margaret Hess is an 46 y.o. 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 She presented to the [facility] on 06/15/12 with shortness of breath. She denied ever had any chest pain, leg swelling, calf tenderness, Hx of known HTN, viral syndrome, recent distant travel, or any ill contact. She uses no alcohol and denied drug use. She doesn't smoke. Evaluation in the ER showed a D-dimer of 11, with CTPA negative for PE,but with bilateral pleural effusions, Pro BNP 6439, INR of 1.62. LFTs with ALT 639, and AST 522, WBC 11.8, Hb 14.8. EKG showed ST without any evidence of LVH or ischemia. She was given 40mg  IV  Lasix and diuresed 1300cc and felt markedly better.   Hospital Course:   Acute respiratory failure with hypoxia due to :  A) Acute systolic CHF (congestive heart failure), NYHA class 4 - EF <15% per cath - NICM  B) Moderate Bilateral pleural effusions  C) Grade 3 acute diastolic dysfunction with heart failure  *Acute severe systolic dysfunction due to NICM - RH/LH cath negative  *cardiology assistance appreciated. Coreg started 06/18/12  *Continue Lasix 20 mg PO daily with spironolactone  *Cards titrating ACE I for afterload reduction  *Continue digoxin  *MRI yields LV thrombus. Iron 29, ferritin 90.  On day of discharge no signs volume overload. Ambulates without problem. Wt 80.8kg at discharge LV (left ventricular) mural thrombus without MI  *Continue Lovenox and Coumadin (heparin discontinued 06/19/12  Will bridge per cardiology. INR 2.15. Will discharge with coumadin 5mg  and 5 doses 80mg  lovenox .INR check 06/23/12 Chronic passive hepatic congestion  *Secondary to acute heart failure  *LFTs trending down after diuresis  Hypokalemia  * Continue oral replete . resolved  Mild Pulmonary hypertension  *Suspect acute and related to the severity of the patient's systolic and diastolic heart failure  Diabetes mellitus  *Victoza and metformin on hold during acute illness. During hospitalization managed with SSI. At discharge resume home meds. *Hemoglobin A1c 7.0   Sub optimal HDL  *Will need statin added once LFT's back to normal  HTN (hypertension)  *Current blood pressure well controlled and was not on medications prior to admission . At discharge SBP range 108-114. Anxiety  *Continue Wellbutrin and Cymbalta . Stable  Leukocytosis  *Likely  reactive due to acute illness with as well as LV thrombus . resolved      Procedures: 06/16/12 right and left heart cath with selectie coronary angiography Consultations:  Cardiology  No antibiotics  Discharge Exam: Filed Vitals:    06/18/12 1442 06/18/12 2128 06/19/12 0543 06/19/12 0905  BP: 117/76 113/76 110/80 114/85  Pulse: 100 108 102   Temp: 98 F (36.7 C) 98.4 F (36.9 C) 98.4 F (36.9 C)   TempSrc: Oral Oral Oral   Resp: 18 18 18    Height:      Weight:   80.876 kg (178 lb 4.8 oz)   SpO2: 98% 100% 100%     General: awake alert NAD. Walking in hall.  Cardiovascular: tachycardic but regular. No MGR No LEE Respiratory: normal effort. BSCTAB No wheeze, rhonchi.   Discharge Instructions      Discharge Orders    Future Appointments: Provider: Department: Dept Phone: Center:   06/24/2012 10:00 AM [facility] [phone] None     Future Orders Please Complete By Expires   Diet - low sodium heart healthy      Increase activity slowly      Call MD for:  difficulty breathing, headache or visual disturbances      Call MD for:  persistant dizziness or light-headedness          Medication List     As of 06/19/2012 10:54 AM    TAKE these medications         buPROPion 75 MG tablet   Commonly known as: WELLBUTRIN   Take 75 mg by mouth daily.      carvedilol 3.125 MG tablet   Commonly known as: COREG   Take 1 tablet (3.125 mg total) by mouth 2 (two) times daily with a meal.      digoxin 0.25 MG tablet   Commonly known as: LANOXIN   Take 1 tablet (0.25 mg total) by mouth daily.      docusate sodium 100 MG capsule   Commonly known as: COLACE   Take 1 capsule (100 mg total) by mouth 2 (two) times daily.      DULoxetine 20 MG capsule   Commonly known as: CYMBALTA   Take 20 mg by mouth daily.      ferrous sulfate 325 (65 FE) MG tablet   Take 1 tablet (325 mg total) by mouth 2 (two) times daily.      furosemide 20 MG tablet   Commonly known as: LASIX   Take 1 tablet (20 mg total) by mouth daily.      lisinopril 10 MG tablet   Commonly known as: PRINIVIL,ZESTRIL   Take 1 tablet (10 mg total) by mouth 2 (two) times daily.      metFORMIN 500 MG tablet   Commonly known  as: GLUCOPHAGE   Take 500 mg by mouth 2 (two) times daily with a meal.      potassium chloride SA 20 MEQ tablet   Commonly known as: K-DUR,KLOR-CON   Take 1 tablet (20 mEq total) by mouth 2 (two) times daily.      spironolactone 25 MG tablet   Commonly known as: ALDACTONE   Take 1 tablet (25 mg total) by mouth daily.      VICTOZA [place]   Inject 1.2 mLs into the skin daily.      warfarin 5 MG tablet   Commonly known as: COUMADIN   Take 1 tablet (5 mg total) by mouth daily.  zolpidem 10 MG tablet   Commonly known as: AMBIEN   Take 10 mg by mouth at bedtime as needed. For sleep        Follow-up Information    Follow up with Arvilla Meres, MD. On 06/24/2012. (at 10a [employer] Code 0800)    Contact information:   [address] [place] [address] [phone]       Follow up with [facility] - Coumadin Follow up. On 06/23/2012. (They will contact you. )    Contact information:   [address] [place] [phone]          The results of significant diagnostics from this hospitalization (including imaging, microbiology, ancillary and laboratory) are listed below for reference.    Significant Diagnostic Studies: Ct Angio Chest Pe W/cm &/or Wo Cm  06/15/2012  *RADIOLOGY REPORT*  Clinical Data: Cardia.  Dyspnea.  Elevated D-dimer.  CT ANGIOGRAPHY CHEST  Technique:  Multidetector CT imaging of the chest using the standard protocol during bolus administration of intravenous contrast. Multiplanar reconstructed images including MIPs were obtained and reviewed to evaluate the vascular anatomy.  Contrast: 80mL OMNIPAQUE IOHEXOL 350 MG/ML SOLN  Comparison: None.  Findings: There are no pulmonary emboli.  There is slight cardiomegaly.  No pulmonary edema. The lungs are clear.  There are moderate bilateral pleural effusions.  No osseous abnormality.  No visible coronary artery calcifications.  No significant adenopathy.  IMPRESSION: Slight  cardiomegaly with moderate bilateral pleural effusions.  No pulmonary emboli.   Original Report Authenticated By: Gwynn Burly, M.D.    Mr Card Morphology Wo/w Cm  06/17/2012  *RADIOLOGY REPORT*  Clinical Data: Cardiomyopathy, evaluate for myocarditis, infiltrative disease.  MR CARDIA MORPHOLOGY WITHOUT AND WITH CONTRAST  GE 1.5 T magnet with dedicated cardiac coil.  FIESTA sequences for function and morphology.  10 minutes after 25 mL Multihance contrast was injected, inversion recovery sequences were done to assess for myocardial delayed enhancement. EF was calculated at a dedicated workstation.  Contrast: 25mL MULTIHANCE GADOBENATE DIMEGLUMINE 529 MG/ML IV SOLN  Comparison: None.  Findings: Mildly dilated left ventricle with severe systolic dysfunction, EF 20%. Global hypokinesis.  There was prominent trabeculation predominantly along the anterior wall, with diastolic noncompacted/compacted ratio = 2.  There was a small thrombus along the apical anterior wall. There was mild to moderate left atrial enlargement.  Normal right ventricular size with mildly decreased systolic function.  Mild right atrial enlargement.  There was moderate tricuspid regurgitation and probably mild mitral regurgitation.  No aortic insufficiency or stenosis.  On delayed enhancement imaging, there was no myocardial delayed enhancement.  Measurements:  LV EDV 192 mL  LV SV 39 mL  LV EF 20.4%  IMPRESSION: 1. Mildly dilated LV with severe systolic dysfunction, EF 20%, with global hypokinesis.  2. Small LV thrombus along the apical anterior wall.  3. 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  However, other walls do not show as prominent trabeculation.  The noncompacted/compacted ratio in diastole along the anterior wall is of borderline significance (2).  4. Normal RV size with mildly decreased systolic function.  5. No myocardial delayed enhancement.  Therefore, no definitive evidence for prior MI,  myocarditis or infiltrative disease.   Original Report Authenticated By: [email or web address]     Microbiology: No results found for this or any previous visit (from the past 240 hour(s)).   Labs: Basic Metabolic Panel:  Lab 06/19/12 4403 06/18/12 0415 06/17/12 0455 06/16/12 0515 06/15/12 2248 06/15/12  1720  NA 137 139 138 135 -- 135  K 4.7 3.6 3.3* 3.6 -- 4.8  CL 100 102 100 97 -- 96  CO2 26 25 25 27  -- 19  GLUCOSE 138* 160* 153* 155* -- 225*  BUN 13 14 15 20  -- 23  CREATININE 0.61 0.56 0.60 0.64 0.60 --  CALCIUM 9.1 8.7 8.2* 8.8 -- 9.1  MG -- -- -- -- -- --  PHOS -- -- -- -- -- --   Liver Function Tests:  Lab 06/17/12 0455 06/15/12 1720  AST 149* 522*  ALT 348* 639*  ALKPHOS 51 54  BILITOT 0.5 1.1  PROT 5.5* 6.7  ALBUMIN 2.2* 2.8*   No results found for this basename: LIPASE:5,AMYLASE:5 in the last 168 hours No results found for this basename: AMMONIA:5 in the last 168 hours CBC:  Lab 06/19/12 0610 06/18/12 0415 06/17/12 0455 06/15/12 2248 06/15/12 1720  WBC 11.1* 8.5 9.3 14.3* 11.8*  NEUTROABS -- -- -- -- 5.6  HGB 13.2 13.0 13.3 14.5 14.3  HCT 39.4 37.9 39.0 42.4 41.9  MCV 88.3 87.5 86.5 86.7 86.4  PLT 206 207 174 186 211   Cardiac Enzymes:  Lab 06/16/12 1034 06/16/12 0515 06/15/12 2249 06/15/12 1929  CKTOTAL -- -- -- 47  CKMB -- -- -- 2.7  CKMBINDEX -- -- -- --  TROPONINI <0.30 <0.30 <0.30 <0.30   BNP: BNP (last 3 results)  Basename 06/17/12 0455 06/15/12 1720  PROBNP 1930.0* 6932.0*   CBG:  Lab 06/18/12 2145 06/18/12 1603 06/18/12 1133 06/18/12 0546 06/17/12 2104  GLUCAP 264* 146* 258* 141* 221*    Time coordinating discharge: 45 minutes  Signed:  Gwenyth Bender NP [facility] 06/19/2012, 10:54 AM

## 2012-06-19 NOTE — Telephone Encounter (Signed)
Pt was d/c'd from the hospital today on coumadin. She picked up the coumadin Rx and only 2 pills were prescribed. She is supposed to be on this for more than 2 days. Reviewed the Rx, pharmacy was correct in filling it so will call in a new Rx with more tablets. No other issues or concerns. Pharmacy changed to pt preferred.

## 2012-06-19 NOTE — ED Notes (Addendum)
CBG=246 mg/dl

## 2012-06-20 ENCOUNTER — Encounter (HOSPITAL_COMMUNITY): Payer: Self-pay | Admitting: *Deleted

## 2012-06-20 ENCOUNTER — Inpatient Hospital Stay (HOSPITAL_COMMUNITY): Payer: BC Managed Care – PPO

## 2012-06-20 ENCOUNTER — Emergency Department (HOSPITAL_COMMUNITY): Payer: BC Managed Care – PPO

## 2012-06-20 DIAGNOSIS — Z8679 Personal history of other diseases of the circulatory system: Secondary | ICD-10-CM | POA: Insufficient documentation

## 2012-06-20 DIAGNOSIS — I639 Cerebral infarction, unspecified: Secondary | ICD-10-CM | POA: Diagnosis present

## 2012-06-20 DIAGNOSIS — I6350 Cerebral infarction due to unspecified occlusion or stenosis of unspecified cerebral artery: Secondary | ICD-10-CM

## 2012-06-20 DIAGNOSIS — I5043 Acute on chronic combined systolic (congestive) and diastolic (congestive) heart failure: Secondary | ICD-10-CM

## 2012-06-20 DIAGNOSIS — I6340 Cerebral infarction due to embolism of unspecified cerebral artery: Secondary | ICD-10-CM | POA: Insufficient documentation

## 2012-06-20 DIAGNOSIS — I959 Hypotension, unspecified: Secondary | ICD-10-CM

## 2012-06-20 DIAGNOSIS — I5189 Other ill-defined heart diseases: Secondary | ICD-10-CM

## 2012-06-20 HISTORY — DX: Cerebral infarction, unspecified: I63.9

## 2012-06-20 HISTORY — DX: Personal history of other diseases of the circulatory system: Z86.79

## 2012-06-20 HISTORY — DX: Cerebral infarction due to embolism of unspecified cerebral artery: I63.40

## 2012-06-20 LAB — COMPREHENSIVE METABOLIC PANEL
ALT: 184 U/L — ABNORMAL HIGH (ref 0–35)
AST: 61 U/L — ABNORMAL HIGH (ref 0–37)
Albumin: 2.5 g/dL — ABNORMAL LOW (ref 3.5–5.2)
CO2: 22 mEq/L (ref 19–32)
Calcium: 9.5 mg/dL (ref 8.4–10.5)
Chloride: 98 mEq/L (ref 96–112)
Creatinine, Ser: 0.59 mg/dL (ref 0.50–1.10)
GFR calc non Af Amer: >90 mL/min (ref >90)
Sodium: 133 mEq/L — ABNORMAL LOW (ref 135–145)
Total Bilirubin: 0.3 mg/dL (ref 0.3–1.2)

## 2012-06-20 LAB — PROTIME-INR
INR: 2.01 — ABNORMAL HIGH (ref 0.00–1.49)
Prothrombin Time: 22 seconds — ABNORMAL HIGH (ref 11.6–15.2)

## 2012-06-20 LAB — CBC
HCT: 43.5 % (ref 36.0–46.0)
Hemoglobin: 15.3 g/dL — ABNORMAL HIGH (ref 12.0–15.0)
MCH: 30.1 pg (ref 26.0–34.0)
MCHC: 35.2 g/dL (ref 30.0–36.0)
MCV: 85.6 fL (ref 78.0–100.0)
RDW: 12.5 % (ref 11.5–15.5)

## 2012-06-20 LAB — PHOSPHORUS: Phosphorus: 3.7 mg/dL (ref 2.3–4.6)

## 2012-06-20 LAB — BASIC METABOLIC PANEL
CO2: 25 mEq/L (ref 19–32)
Calcium: 9.6 mg/dL (ref 8.4–10.5)
GFR calc non Af Amer: >90 mL/min (ref >90)
Potassium: 4.3 mEq/L (ref 3.5–5.1)
Sodium: 132 mEq/L — ABNORMAL LOW (ref 135–145)

## 2012-06-20 LAB — GLUCOSE, CAPILLARY: Glucose-Capillary: 247 mg/dL — ABNORMAL HIGH (ref 70–99)

## 2012-06-20 LAB — APTT: aPTT: 49 seconds — ABNORMAL HIGH (ref 24–37)

## 2012-06-20 LAB — RAPID URINE DRUG SCREEN, HOSP PERFORMED
Amphetamines: NOT DETECTED
Benzodiazepines: NOT DETECTED
Opiates: NOT DETECTED
Tetrahydrocannabinol: NOT DETECTED

## 2012-06-20 LAB — MRSA PCR SCREENING: MRSA by PCR: NEGATIVE

## 2012-06-20 MED ORDER — SODIUM CHLORIDE 0.9 % IJ SOLN: 3.0000 mL | INTRAMUSCULAR | Status: DC | PRN

## 2012-06-20 MED ORDER — INSULIN ASPART 100 UNIT/ML ~~LOC~~ SOLN: 0.0000 [IU] | Freq: Three times a day (TID) | SUBCUTANEOUS | Status: DC

## 2012-06-20 MED ORDER — FENTANYL CITRATE 0.05 MG/ML IJ SOLN
INTRAMUSCULAR | Status: AC
  Administered 2012-06-20: 50 ug via INTRAVENOUS
  Filled 2012-06-20: qty 2

## 2012-06-20 MED ORDER — ONDANSETRON HCL 4 MG/2ML IJ SOLN
4.0000 mg | Freq: Once | INTRAMUSCULAR | Status: AC
  Administered 2012-06-20: 4 mg via INTRAVENOUS

## 2012-06-20 MED ORDER — INSULIN ASPART 100 UNIT/ML ~~LOC~~ SOLN
0.0000 [IU] | Freq: Every day | SUBCUTANEOUS | Status: DC
  Administered 2012-06-20: 2 [IU] via SUBCUTANEOUS

## 2012-06-20 MED ORDER — SODIUM CHLORIDE 0.9 % IV SOLN: 250.0000 mL | INTRAVENOUS | Status: DC | PRN

## 2012-06-20 MED ORDER — DOPAMINE-DEXTROSE 3.2-5 MG/ML-% IV SOLN
2.0000 ug/kg/min | INTRAVENOUS | Status: DC
  Administered 2012-06-20: 18 ug/kg/min via INTRAVENOUS
  Administered 2012-06-20: 5 ug/kg/min via INTRAVENOUS
  Administered 2012-06-21: 16 ug/kg/min via INTRAVENOUS
  Filled 2012-06-20 (×5): qty 250

## 2012-06-20 MED ORDER — KETOROLAC TROMETHAMINE 30 MG/ML IJ SOLN
30.0000 mg | Freq: Once | INTRAMUSCULAR | Status: AC
  Administered 2012-06-20: 30 mg via INTRAVENOUS
  Filled 2012-06-20: qty 1

## 2012-06-20 MED ORDER — ASPIRIN 300 MG RE SUPP
300.0000 mg | Freq: Every day | RECTAL | Status: DC
  Administered 2012-06-20 (×2): 300 mg via RECTAL
  Filled 2012-06-20 (×3): qty 1

## 2012-06-20 MED ORDER — WARFARIN - PHYSICIAN DOSING INPATIENT: Freq: Every day | Status: DC

## 2012-06-20 MED ORDER — SODIUM CHLORIDE 0.9 % IJ SOLN
3.0000 mL | Freq: Two times a day (BID) | INTRAMUSCULAR | Status: DC
  Administered 2012-06-20 – 2012-06-25 (×7): 3 mL via INTRAVENOUS

## 2012-06-20 MED ORDER — PANTOPRAZOLE SODIUM 40 MG IV SOLR
40.0000 mg | Freq: Every day | INTRAVENOUS | Status: DC
  Administered 2012-06-20 – 2012-06-22 (×3): 40 mg via INTRAVENOUS
  Filled 2012-06-20 (×5): qty 40

## 2012-06-20 MED ORDER — DIGOXIN 125 MCG PO TABS
0.1250 mg | ORAL_TABLET | Freq: Every day | ORAL | Status: DC
  Administered 2012-06-20 – 2012-06-25 (×6): 0.125 mg via ORAL
  Filled 2012-06-20 (×6): qty 1

## 2012-06-20 MED ORDER — INSULIN ASPART 100 UNIT/ML ~~LOC~~ SOLN
0.0000 [IU] | Freq: Three times a day (TID) | SUBCUTANEOUS | Status: DC
  Administered 2012-06-20 – 2012-06-21 (×3): 5 [IU] via SUBCUTANEOUS
  Administered 2012-06-21 – 2012-06-22 (×2): 2 [IU] via SUBCUTANEOUS
  Administered 2012-06-22: 3 [IU] via SUBCUTANEOUS
  Administered 2012-06-23: 5 [IU] via SUBCUTANEOUS
  Administered 2012-06-23: 3 [IU] via SUBCUTANEOUS
  Administered 2012-06-23: 8 [IU] via SUBCUTANEOUS
  Administered 2012-06-24: 3 [IU] via SUBCUTANEOUS
  Administered 2012-06-24: 2 [IU] via SUBCUTANEOUS
  Administered 2012-06-24: 5 [IU] via SUBCUTANEOUS
  Administered 2012-06-25: 3 [IU] via SUBCUTANEOUS
  Administered 2012-06-25: 8 [IU] via SUBCUTANEOUS

## 2012-06-20 MED ORDER — ONDANSETRON HCL 4 MG/2ML IJ SOLN
INTRAMUSCULAR | Status: AC
  Administered 2012-06-20: 4 mg via INTRAVENOUS
  Filled 2012-06-20: qty 2

## 2012-06-20 MED ORDER — WARFARIN SODIUM 5 MG PO TABS
5.0000 mg | ORAL_TABLET | Freq: Every day | ORAL | Status: DC
  Administered 2012-06-20 – 2012-06-21 (×2): 5 mg via ORAL
  Filled 2012-06-20 (×3): qty 1

## 2012-06-20 MED ORDER — FENTANYL CITRATE 0.05 MG/ML IJ SOLN
50.0000 ug | Freq: Once | INTRAMUSCULAR | Status: AC
  Administered 2012-06-20: 50 ug via INTRAVENOUS

## 2012-06-20 MED ORDER — ALBUTEROL SULFATE HFA 108 (90 BASE) MCG/ACT IN AERS: 4.0000 | INHALATION_SPRAY | RESPIRATORY_TRACT | Status: DC | PRN

## 2012-06-20 NOTE — Evaluation (Signed)
Speech Language Pathology Evaluation Patient Details Name: Margaret Hess MRN: [id number] DOB: Jan 11, 1966 Today's Date: 06/20/2012 Time: 4098-1191 SLP Time Calculation (min): 15 min  Problem List:  Patient Active Problem List  Diagnosis  . Acute systolic CHF (congestive heart failure), NYHA class 4  . HTN (hypertension)  . Anxiety  . Moderate Bilateral pleural effusion  . Dyspnea on exertion  . LV (left ventricular) mural thrombus without MI  . Chronic passive hepatic congestion  . Acute respiratory failure with hypoxia  . Positive D dimer  . Abnormal coagulation profile  . Diabetes mellitus  . Leukocytosis  . Pulmonary hypertension  . Acute diastolic heart failure, NYHA class 3  . Acute combined systolic and diastolic heart failure  . Nonischemic cardiomyopathy  . Stroke, embolic   Past Medical History:  Past Medical History  Diagnosis Date  . Migraines   . Hypertension   . Diabetes mellitus    Past Surgical History: History reviewed. No pertinent past surgical history. HPI:  46 y/o female who was recently hospitalized with dyspnea and was found to have nonischemic cardiomyophathy with ejection fraction of 17%.  Echocardiogram on 10/15 shoewed severe global hypotinesis with a normal left ventricular cavity size and left ventricular apical thrombus.  MRI completed on 06/17/12 showed small apical left verntricular thrombus.  Patient anticoagulated with coumadin and discharged from hosptial with INR that was therapeutic. Patient admitted on 06/19/12 with acure onset of left-sided weakness as well as difficulty speaking.  MRI completed on 10/19 indicates no acute intracranial abnormality. 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    Patient referred for Cognitive Linguistic Evaluation due to noted asphasia .     Assessment / Plan / Recommendation Clinical Impression  Moderate mixed receptive and expressive aphasia.  Decreased intelligibility at phrase and sentence  level due to noted dysarthria.  Semantic paraphasia's, semantic perseveration, jargon, andneologisms noted during attempts to answer target questions.  Accuracy of yes/no questions inconsistent with no awareness of errors however, patient aware of articulation errors during conversational speech as she would shake her head no and attempt to self correct.   Patient with flat affect but easily frustrated with attempts to answer personal information questions.  Cognition difficult to assess due to receptive and expressive aphasia .  ST to follow in acute care setting  to improve functional communication skills and further assess cognitive skills.      SLP Assessment  Patient needs continued Speech Lanaguage Pathology Services    Follow Up Recommendations  Inpatient Rehab    Frequency and Duration min 3x week  2 weeks      SLP Goals  SLP Goals Potential to Achieve Goals: Good Potential Considerations: Previous level of function SLP Goal #1: Correctly answer yes/no questions with various complexity 8/10 with moderate verbal cues.  SLP Goal #2: Increase intelligibility of speech to sentence level with moderate assist to utilize recommended speech strategies.  i.e. slow rate of speech,  increased vocal volume.  SLP Goal #3: Answer personal information questions with decreased perseveration with moderate verbal cues.   SLP Evaluation Prior Functioning  Cognitive/Linguistic Baseline: Within functional limits Lives With: Friend(s) Available Help at Discharge: Family;Available PRN/intermittently;Friend(s) Vocation: Full time employment   Cognition  Overall Cognitive Status: Impaired Arousal/Alertness: Lethargic Orientation Level: Oriented to person;Oriented to place;Oriented to situation Attention: Focused Focused Attention: Impaired Focused Attention Impairment: Verbal basic;Functional basic Memory: Appears intact Awareness: Appears intact Problem Solving: Impaired Problem Solving  Impairment: Verbal basic;Functional basic;Functional complex Executive Function: Initiating Initiating:  Impaired Initiating Impairment: Verbal basic;Functional basic Behaviors: Restless;Impulsive;Perseveration;Verbal agitation;Poor frustration tolerance Safety/Judgment: Impaired    Comprehension  Auditory Comprehension Overall Auditory Comprehension: Impaired Yes/No Questions: Impaired Complex Questions: 25-49% accurate Commands: Impaired Two Step Basic Commands: 25-49% accurate Multistep Basic Commands: 25-49% accurate Interfering Components: Attention;Processing speed EffectiveTechniques: Extra processing time;Increased volume;Repetition;Stressing words Visual Recognition/Discrimination Discrimination: Not tested Reading Comprehension Reading Status: Not tested    Expression Expression Primary Mode of Expression: Verbal Verbal Expression Overall Verbal Expression: Impaired Initiation: Impaired Automatic Speech: Counting Level of Generative/Spontaneous Verbalization: Word;Phrase Repetition: Impaired Level of Impairment: Phrase level;Sentence level Naming: Impairment Responsive: 51-75% accurate Confrontation: Impaired Convergent: 25-49% accurate Verbal Errors: Semantic paraphasias;Neologisms;Perseveration;Jargon;Aware of errors Pragmatics: Impairment Impairments: Abnormal affect Interfering Components: Attention Effective Techniques: Phonemic cues;Sentence completion;Open ended questions Non-Verbal Means of Communication: Not applicable Written Expression Dominant Hand: Right   Oral / Motor Oral Motor/Sensory Function Overall Oral Motor/Sensory Function: Impaired Labial ROM: Reduced left Labial Symmetry: Abnormal symmetry left Labial Strength: Reduced Labial Sensation: Reduced Lingual ROM: Reduced left Lingual Symmetry: Abnormal symmetry left Lingual Strength: Reduced Lingual Sensation: Reduced Facial ROM: Reduced left Facial Symmetry: Left droop Facial  Strength: Reduced Facial Sensation: Reduced Velum: Within Functional Limits Mandible: Within Functional Limits Motor Speech Overall Motor Speech: Impaired Respiration: Impaired Level of Impairment: Phrase Phonation: Normal Resonance: Within functional limits Articulation: Impaired Level of Impairment: Word Intelligibility: Intelligibility reduced Word: 50-74% accurate Phrase: 50-74% accurate Sentence: 25-49% accurate Conversation: 0-24% accurate Motor Planning: Witnin functional limits Effective Techniques: Slow rate;Increased vocal intensity;Pause   GO    Moreen Fowler MS, CCC-SLP [phone] [facility] 06/20/2012, 7:16 PM

## 2012-06-20 NOTE — Progress Notes (Signed)
Margaret Hess is an 46 y.o. female was admitted 06/19/2012, for congestive heart failure, EF 17%, a left ventricular thrombus, and started on anticoagulation. She was discharged earlier 10/18 with a therapeutic INR   At 10:30 PM 10/18, she had acute onset of left-sided weakness as well as difficulty speaking. EMS was called and they called a code stroke. In transit, she was seen to have improvement, most notably in her left-sided weakness, and her speech seemed clear some.   Her sister is at bedside, she is a community Retail buyer, now has comprehensive aphasia, dense flaccid left hemiparesis, CT-head x2 was negative.  She is on dopamine to maintain her systolic BP 120-140s.     Past Medical History   Diagnosis  Date   .  Migraines    .  Hypertension    .  Diabetes mellitus     Family History:  No history of clotting disorder  Social History:  Tob: Denies  Exam:  Current vital signs:  BP 133/80s.  Pulse 108  Temp 98.4 F (36.9 C) (Oral)  Resp 20  SpO2 97%  Vital signs in last 24 hours:  Temp: [98.4 F (36.9 C)] 98.4 F (36.9 C) (10/18 2348)  Pulse Rate: [102-108] 108 (10/18 2348)  Resp: [18-20] 20 (10/18 2348)  BP: (110-120)/(80-93) 120/93 mmHg (10/18 2348)  SpO2: [97 %-100 %] 97 % (10/18 2348)  Weight: [80.876 kg (178 lb 4.8 oz)] 80.876 kg (178 lb 4.8 oz) (10/18 0543)  General: lying in bed quietly CV: Slightly tachycardic, but regular  Mental Status:  Patient follows commands immediately, but has a significant expressive aphasia, paraphasic error, She is awake, and alert  Cranial Nerves:     III,IV, VI: EOMI without ptosis or diploplia. Left visual field deficit V,VII: She has a left lower facial droop,  VIII: hearing is intact to voice  X: Uvula elevates symmetrically  XI: Shoulder shrug decreased at left side.Marland Kitchen  XII: tongue is midline without atrophy or fasciculations.  Motor:  Dense flaccid left hemiparesis, left arm 0, left leg 0. Sensory:    Responsive to pain at both side. Deep Tendon Reflexes:  Hyporeflexia at left. Plantars:  Toes are downgoing right, upgoing left.  Cerebellar:  FNF and HKS are intact bilaterally  Gait:  Deferred.     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    1. MRI, MRA of the brain without contrast  2. I have called Cardiologist on call to see her again. 3. PT consult, OT consult, Speech consult  4.  Keep dopamine drip to maintain sbp 120-140s. 5, Keep  coumadin,   ASA was added on.

## 2012-06-20 NOTE — ED Notes (Signed)
Pt vomited on herself, MD made aware and pt given zofran. Pt cleaned up and linen changed.

## 2012-06-20 NOTE — ED Notes (Signed)
Pt back from ct scan with nurse

## 2012-06-20 NOTE — Evaluation (Signed)
Clinical/Bedside Swallow Evaluation Patient Details  Name: Margaret Hess MRN: [id number] Date of Birth: [birth year]  Today's Date: 06/20/2012 Time: 1300-1330 SLP Time Calculation (min): 30 min  Past Medical History:  Past Medical History  Diagnosis Date  . Migraines   . Hypertension   . Diabetes mellitus    Past Surgical History: History reviewed. No pertinent past surgical history. HPI:   This is a 46 year old woman was recently hospitalized with dyspnea and was found to have a nonischemic cardiomyopathy with ejection fraction of 17%.  Her echocardiogram on 06/16/12 showed severe global hypokinesis with a normal left ventricular cavity size and and left ventricular apical thrombus.  The patient had an MRI on 06/17/2012 which confirmed of the small apical left ventricular thrombus. The patient was anticoagulated with Coumadin and at discharge from the hospital yesterday her INR was therapeutic.  She was also given a prescription for 3 days of cross coverage with Lovenox 80 mg subcutaneously twice a day.  Patient admitted on 06/19/12 with acute onset of left-sided weakness as well as difficulty speaking.   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  MRI completed on 06/20/12 indicates No acute intracranial abnormalities with  stable appearance since previous study.  Patient referred for BSE per stroke protocol.          Assessment / Plan / Recommendation Clinical Impression  Moderate oral sensory motor dysphagia with poor oral awareness and oral holding with trial solids. Extended mastication with moderate verbal cues to continue oral prep stage with solids.  Slight delay in initiation but no outward s/s of aspiration noted with PO trials.  Due to noted decreased oral awareness and fluctuating LOA recommend to proceed with conservative diet of dysphagia 1 (puree)  with thin liquids with full supervision with all meals.  ST to follow on 10/20 for diet tolerance and possible diet  upgrade.    Aspiration Risk  Moderate    Diet Recommendation Dysphagia 1 (Puree);Thin liquid   Liquid Administration via: Cup;Straw Medication Administration: Crushed with puree Supervision: Full supervision/cueing for compensatory strategies Compensations: Slow rate;Small sips/bites;Check for pocketing;Clear throat intermittently Postural Changes and/or Swallow Maneuvers: Seated upright 90 degrees;Upright 30-60 min after meal    Other  Recommendations Oral Care Recommendations: Oral care QID Other Recommendations: Clarify dietary restrictions   Follow Up Recommendations  Inpatient Rehab    Frequency and Duration min 3x week  2 weeks       SLP Swallow Goals Patient will consume recommended diet without observed clinical signs of aspiration with: Supervision/safety Patient will utilize recommended strategies during swallow to increase swallowing safety with: Supervision/safety   Swallow Study Prior Functional Status     Lived at home. No prior reports of dysphagia   General Date of Onset: 06/19/12 HPI:  This is a 46 year old woman was recently hospitalized with dyspnea and was found to have a nonischemic cardiomyopathy with ejection fraction of 17%.  Her echocardiogram on 06/16/12 showed severe global hypokinesis with a normal left ventricular cavity size and and left ventricular apical thrombus.  The patient had an MRI on 06/17/2012 which confirmed of the small apical left ventricular thrombus. The patient was anticoagulated with Coumadin and at discharge from the hospital yesterday her INR was therapeutic.  She was also given a prescription for 3 days of cross coverage with Lovenox 80 mg subcutaneously twice a day.  Patient admitted on 06/19/12 with acute onset of left-sided weakness as well as difficulty speaking. 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  Type of Study: Bedside swallow evaluation Previous Swallow Assessment: no prior reports Diet Prior to this  Study: NPO Temperature Spikes Noted: No Respiratory Status: Other (comment) (nasal cannula) History of Recent Intubation: No Behavior/Cognition: Cooperative;Agitated;Distractible;Requires cueing Oral Cavity - Dentition: Adequate natural dentition Self-Feeding Abilities: Needs assist Patient Positioning: Upright in bed Baseline Vocal Quality: Clear Volitional Cough: Cognitively unable to elicit Volitional Swallow: Unable to elicit    Oral/Motor/Sensory Function Overall Oral Motor/Sensory Function: Impaired Labial ROM: Reduced left Labial Symmetry: Abnormal symmetry left Labial Strength: Reduced Labial Sensation: Reduced Lingual ROM: Reduced left Lingual Strength: Reduced Lingual Sensation: Reduced Facial ROM: Reduced left Facial Symmetry: Left droop Facial Strength: Reduced Facial Sensation: Reduced   Ice Chips Ice chips: Impaired Presentation: Spoon Oral Phase Impairments: Reduced labial seal;Reduced lingual movement/coordination;Impaired anterior to posterior transit;Impaired mastication   Thin Liquid Thin Liquid: Impaired Presentation: Cup;Spoon;Straw Oral Phase Impairments: Reduced lingual movement/coordination;Impaired anterior to posterior transit Oral Phase Functional Implications: Prolonged oral transit Pharyngeal  Phase Impairments: Suspected delayed Swallow    Nectar Thick Nectar Thick Liquid: Not tested   Honey Thick Honey Thick Liquid: Not tested   Puree Puree: Impaired Presentation: Spoon Oral Phase Impairments: Poor awareness of bolus;Impaired anterior to posterior transit Oral Phase Functional Implications: Prolonged oral transit;Oral residue Pharyngeal Phase Impairments: Suspected delayed Swallow   Solid   GO    Solid: Impaired Oral Phase Impairments: Reduced lingual movement/coordination;Poor awareness of bolus Oral Phase Functional Implications: Oral residue;Oral holding Pharyngeal Phase Impairments: Suspected delayed Joneen Caraway MS,  CCC-SLP [phone] [facility] 06/20/2012,6:25 PM

## 2012-06-20 NOTE — Progress Notes (Addendum)
Patient initially had left sided weakness, improved prior to my initial evaluation, then subsequently developed weakness again with hemineglect and gaze preference.   Repeat CT was performed to rule out hemorrhage. Her BP had been normal, but hypotensive for someone with multiple infarctions. She was therefore started on dopamaine with a SBP goal > 140.   It is possible that she had a partial recanalization following her initial stroke with subsequent re-thrombosis, or that she had more clot leave her LV and embolize.   She is already theraputically anticoagulated and has been given aspirin. Critical care has been consulted for admission given her severe heart failure and need of pressors.   This patient is critically ill and at significant risk of neurological worsening, death and care requires constant monitoring of vital signs, hemodynamics,respiratory and cardiac monitoring, neurological assessment, discussion with family, other specialists and medical decision making of high complexity. I spent 60 minutes of neurocritical care time  in the care of  this patient.

## 2012-06-20 NOTE — ED Notes (Signed)
Report given to ICU nurse and pt transported to ICU with nurse and monitor. Pt alert and in NAD at time of admission

## 2012-06-20 NOTE — Procedures (Signed)
Central Venous Catheter Insertion Procedure Note Margaret Hess [id number] 1966-08-03  Procedure: Insertion of Central Venous Catheter Indications: Drug and/or fluid administration  Procedure Details Consent: Risks of procedure as well as the alternatives and risks of each were explained to the (patient/caregiver).  Consent for procedure obtained. Time Out: Verified patient identification, verified procedure, site/side was marked, verified correct patient position, special equipment/implants available, medications/allergies/relevent history reviewed, required imaging and test results available.  Performed  Maximum sterile technique was used including antiseptics, cap, gloves, gown, hand hygiene, mask and sheet. Skin prep: Chlorhexidine; local anesthetic administered A antimicrobial bonded/coated triple lumen catheter was placed in the right subclavian vein using the Seldinger technique.  Evaluation Blood flow good Complications: No apparent complications Patient did tolerate procedure well. Chest X-ray ordered to verify placement.  CXR: pending.  Lavella Hammock, M.D. Pulmonary and Critical Care Medicine Call E-link with questions [phone] 06/20/2012, 4:18 AM

## 2012-06-20 NOTE — Consult Note (Signed)
Reason for Consult:New stroke, probably embolic Referring Physician: Dr. Terrace Arabia  primary cardiologist: Dr. Estill Bamberg Buche is an 46 y.o. female.  HPI:   This is a 46 year old woman was recently hospitalized with dyspnea and was found to have a nonischemic cardiomyopathy with ejection fraction of 17%.  Her echocardiogram on 06/16/12 showed severe global hypokinesis with a normal left ventricular cavity size and and left ventricular apical thrombus.  The patient had an MRI on 06/17/2012 which confirmed of the small apical left ventricular thrombus. The patient was anticoagulated with Coumadin and at discharge from the hospital yesterday her INR was therapeutic.  She was also given a prescription for 3 days of cross coverage with Lovenox 80 mg subcutaneously twice a day.  Last evening at about 10:30 PM the patient had acute onset of left-sided weakness as well as difficulty speaking.  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  We are asked to see her in regard to maintaining adequate systolic blood pressure with goal of systolic blood pressure of 120-140.  She is presently on dopamine drip with satisfactory response.  The patient denies any chest discomfort.  She is not having any active dyspnea at this time.  Past Medical History  Diagnosis Date  . Migraines   . Hypertension   . Diabetes mellitus     History reviewed. No pertinent past surgical history.  History reviewed. No pertinent family history.  Social History:  reports that she has never smoked. She has never used smokeless tobacco. She reports that she drinks alcohol. She reports that she does not use illicit drugs.  Allergies: No Known Allergies  Medications:  Scheduled:   . aspirin  300 mg Rectal Daily  . fentaNYL  50 mcg Intravenous Once  . ondansetron (ZOFRAN) IV  4 mg Intravenous Once  . pantoprazole (PROTONIX)  IV  40 mg Intravenous QHS  . sodium chloride  3 mL Intravenous Q12H  . warfarin  5 mg Oral q1800  . Warfarin - Physician Dosing Inpatient   Does not apply q1800    Results for orders placed during the hospital encounter of 06/19/12 (from the past 48 hour(s))  POCT I-STAT TROPONIN I     Status: Normal   Collection Time   06/19/12 11:32 PM      Component Value Range Comment   Troponin i, poc 0.04  0.00 - 0.08 ng/mL    Comment 3            PROTIME-INR     Status: Abnormal   Collection Time   06/19/12 11:32 PM      Component Value Range Comment   Prothrombin Time 22.0 (*) 11.6 - 15.2 seconds    INR 2.01 (*) 0.00 - 1.49   APTT     Status: Abnormal   Collection Time   06/19/12 11:32 PM      Component Value Range Comment   aPTT 49 (*) 24 - 37 seconds   CBC     Status: Normal   Collection Time   06/19/12 11:32 PM      Component Value Range Comment   WBC 10.1  4.0 - 10.5 K/uL    RBC 4.61  3.87 - 5.11 MIL/uL    Hemoglobin 13.8  12.0 - 15.0 g/dL    HCT 46.9  62.9 - 52.8 %    MCV 87.0  78.0 - 100.0 fL    MCH 29.9  26.0 - 34.0 pg    MCHC 34.4  30.0 - 36.0 g/dL    RDW 16.1  09.6 - 04.5 %    Platelets 217  150 - 400 K/uL   DIFFERENTIAL     Status: Normal   Collection Time   06/19/12 11:32 PM      Component Value Range Comment   Neutrophils Relative 53  43 - 77 %    Neutro Abs 5.4  1.7 - 7.7 K/uL    Lymphocytes Relative 38  12 - 46 %    Lymphs Abs 3.8  0.7 - 4.0 K/uL    Monocytes Relative 6  3 - 12 %    Monocytes Absolute 0.6  0.1 - 1.0 K/uL    Eosinophils Relative 2  0 - 5 %    Eosinophils Absolute 0.2  0.0 - 0.7 K/uL    Basophils Relative 0  0 - 1 %    Basophils Absolute 0.0  0.0 - 0.1 K/uL   COMPREHENSIVE METABOLIC PANEL     Status: Abnormal   Collection Time   06/19/12 11:32 PM      Component Value Range Comment   Sodium 133 (*) 135 - 145 mEq/L    Potassium 4.7  3.5 - 5.1 mEq/L    Chloride 98  96 - 112 mEq/L    CO2 22  19 - 32 mEq/L    Glucose, Bld 268 (*) 70 - 99 mg/dL     BUN 15  6 - 23 mg/dL    Creatinine, Ser 4.09  0.50 - 1.10 mg/dL    Calcium 9.5  8.4 - 81.1 mg/dL    Total Protein 6.5  6.0 - 8.3 g/dL    Albumin 2.5 (*) 3.5 - 5.2 g/dL    AST 61 (*) 0 - 37 U/L    ALT 184 (*) 0 - 35 U/L    Alkaline Phosphatase 55  39 - 117 U/L    Total Bilirubin 0.3  0.3 - 1.2 mg/dL    GFR calc non Af Amer >90  >90 mL/min    GFR calc Af Amer >90  >90 mL/min   POCT I-STAT, CHEM 8     Status: Abnormal   Collection Time   06/19/12 11:33 PM      Component Value Range Comment   Sodium 134 (*) 135 - 145 mEq/L    Potassium 4.7  3.5 - 5.1 mEq/L    Chloride 100  96 - 112 mEq/L    BUN 14  6 - 23 mg/dL    Creatinine, Ser 9.14  0.50 - 1.10 mg/dL    Glucose, Bld 782 (*) 70 - 99 mg/dL    Calcium, Ion 9.56  2.13 - 1.23 mmol/L    TCO2 21  0 - 100 mmol/L    Hemoglobin 13.6  12.0 - 15.0 g/dL    HCT 08.6  57.8 - 46.9 %   TROPONIN I     Status: Normal   Collection Time   06/19/12 11:33 PM      Component Value Range Comment   Troponin I <0.30  <0.30 ng/mL   GLUCOSE, CAPILLARY     Status: Abnormal   Collection Time   06/19/12 11:52 PM      Component Value Range Comment   Glucose-Capillary 247 (*) 70 - 99 mg/dL   URINE RAPID DRUG SCREEN (HOSP PERFORMED)     Status: Normal   Collection Time   06/20/12  1:38 AM  Component Value Range Comment   Opiates NONE DETECTED  NONE DETECTED    Cocaine NONE DETECTED  NONE DETECTED    Benzodiazepines NONE DETECTED  NONE DETECTED    Amphetamines NONE DETECTED  NONE DETECTED    Tetrahydrocannabinol NONE DETECTED  NONE DETECTED    Barbiturates NONE DETECTED  NONE DETECTED   MRSA PCR SCREENING     Status: Normal   Collection Time   06/20/12  3:02 AM      Component Value Range Comment   MRSA by PCR NEGATIVE  NEGATIVE   BASIC METABOLIC PANEL     Status: Abnormal   Collection Time   06/20/12  6:19 AM      Component Value Range Comment   Sodium 132 (*) 135 - 145 mEq/L    Potassium 4.3  3.5 - 5.1 mEq/L    Chloride 93 (*) 96 - 112  mEq/L    CO2 25  19 - 32 mEq/L    Glucose, Bld 269 (*) 70 - 99 mg/dL    BUN 12  6 - 23 mg/dL    Creatinine, Ser 4.09 (*) 0.50 - 1.10 mg/dL    Calcium 9.6  8.4 - 81.1 mg/dL    GFR calc non Af Amer >90  >90 mL/min    GFR calc Af Amer >90  >90 mL/min   CBC     Status: Abnormal   Collection Time   06/20/12  6:19 AM      Component Value Range Comment   WBC 12.6 (*) 4.0 - 10.5 K/uL    RBC 5.08  3.87 - 5.11 MIL/uL    Hemoglobin 15.3 (*) 12.0 - 15.0 g/dL    HCT 91.4  78.2 - 95.6 %    MCV 85.6  78.0 - 100.0 fL    MCH 30.1  26.0 - 34.0 pg    MCHC 35.2  30.0 - 36.0 g/dL    RDW 21.3  08.6 - 57.8 %    Platelets 235  150 - 400 K/uL   MAGNESIUM     Status: Normal   Collection Time   06/20/12  6:19 AM      Component Value Range Comment   Magnesium 1.8  1.5 - 2.5 mg/dL   PHOSPHORUS     Status: Normal   Collection Time   06/20/12  6:19 AM      Component Value Range Comment   Phosphorus 3.7  2.3 - 4.6 mg/dL     Ct Head Wo Contrast  06/20/2012  *RADIOLOGY REPORT*  Clinical Data: Increased aphasia and worsening left-sided weakness.  CT HEAD WITHOUT CONTRAST  Technique:  Contiguous axial images were obtained from the base of the skull through the vertex without contrast.  Comparison: 06/19/2012 at 2329 hours  Findings: The ventricles and sulci are symmetrical without significant effacement, displacement, or dilatation. No mass effect or midline shift. No abnormal extra-axial fluid collections. The grey-white matter junction is distinct. Basal cisterns are not effaced. No acute intracranial hemorrhage. No depressed skull fractures.  No significant change since previous study.  IMPRESSION: No acute intracranial abnormalities.  Stable appearance since previous study.   Original Report Authenticated By: Marlon Pel, M.D.    Ct Head Wo Contrast  06/19/2012  *RADIOLOGY REPORT*  Clinical Data: Code stroke, left-sided weakness, aphasia  CT HEAD WITHOUT CONTRAST  Technique:  Contiguous axial images  were obtained from the base of the skull through the vertex without contrast.  Comparison: None.  Findings: No evidence of parenchymal  hemorrhage or extra-axial fluid collection. No mass lesion, mass effect, or midline shift.  No CT evidence of acute infarction.  Cerebral volume is age appropriate.  No ventriculomegaly.  The visualized paranasal sinuses are essentially clear. The mastoid air cells are unopacified.  No evidence of calvarial fracture.  IMPRESSION: No evidence of acute intracranial abnormality.  These results were called by telephone on 06/19/2012 at 1143 hours to Dr. Fonnie Jarvis, who verbally acknowledged these results.   Original Report Authenticated By: Charline Bills, M.D.    Dg Chest Port 1 View  06/20/2012  *RADIOLOGY REPORT*  Clinical Data: Right-sided CV L.  PORTABLE CHEST - 1 VIEW  Comparison: CT chest 06/15/2012  Findings: Right central venous catheter with tip over the low SVC region, allowing for patient rotation.  No pneumothorax.  Elevation of right hemidiaphragm.  Borderline heart size with normal pulmonary vascularity.  No focal airspace consolidation in the lungs.  No blunting of costophrenic angles.  IMPRESSION: Right central venous catheter with tip in the low SVC region.  No pneumothorax.   Original Report Authenticated By: Marlon Pel, M.D.     Review of systems: Unremarkable except for present illness Blood pressure 148/89, pulse 119, temperature 98.6 F (37 C), temperature source Core (Comment), resp. rate 21, height 5\' 7"  (1.702 m), weight 173 lb 1 oz (78.5 kg), SpO2 99.00%. The patient appears to be in no distress.  She is frustrated by her hemiparesis.  She is lying flat without apparent dyspnea.  Head and neck exam reveals that the pupils are equal and reactive.  The extraocular movements are full.  There is no scleral icterus.  Mouth and pharynx are benign.  No lymphadenopathy.  No carotid bruits.  The jugular venous pressure is difficult to see but does not  appear to be elevated.  Thyroid is not enlarged or tender.  Chest is clear to percussion and auscultation.  No rales or rhonchi.  Expansion of the chest is symmetrical.  Heart reveals no abnormal lift or heave.  First and second heart sounds are normal.  There is no murmur  rub or click.  There is a soft S3 gallop.  Rhythm is sinus tachycardia  The abdomen is soft and nontender.  Bowel sounds are normoactive.  There is no hepatosplenomegaly or mass.  There are no abdominal bruits.  Extremities reveal no phlebitis or edema.  Pedal pulses are good.  There is no cyanosis or clubbing.  Neurologic exam flaccid paralysis of left arm and left leg  Integument reveals no rash ------------------------------------------------------------  2-D echo done on 06/16/12   - Left ventricle: The cavity size was normal. Wall thickness was normal. The estimated ejection fraction is <20%. Severe global hypokinesis with minimal regional variation. There appears to be a non-mobile, calcified mass measuring 1.0 x 1.5 cm on the apical lateral wall most consistent with organized thrombus. Doppler parameters are consistent with restrictive left ventricular relaxation (grade3 diastolic dysfunction). The E/A ratio is >2. The MV decel time is 99 msec. The E/e' is >20, suggesting markedly increased LV filling pressure. - Mitral valve: Mildly thickened leaflets . Trivial regurgitation. There is a "B"notch seen on m-mode through the mitral leaflets, suggesting elevated LA pressure. - Left atrium: The atrium was normal in size. - Right atrium: The atrium was at the upper limits of normal in size. - Atrial septum: No defect or patent foramen ovale was identified by color doppler. - Tricuspid valve: Moderate regurgitation. - Pulmonary arteries: PA peak pressure: 48mm Hg (S). -  Systemic veins: The IVC is dilated >2.1 cm but collapes   MRI  *RADIOLOGY REPORT*  Clinical Data: Cardiomyopathy, evaluate for  myocarditis,  infiltrative disease.  MR CARDIA MORPHOLOGY WITHOUT AND WITH CONTRAST  GE 1.5 T magnet with dedicated cardiac coil. FIESTA sequences for  function and morphology. 10 minutes after 25 mL Multihance  contrast was injected, inversion recovery sequences were done to  assess for myocardial delayed enhancement. EF was calculated at a  dedicated workstation.  Contrast: 25mL MULTIHANCE GADOBENATE DIMEGLUMINE 529 MG/ML IV SOLN  Comparison: None.  Findings: Mildly dilated left ventricle with severe systolic  dysfunction, EF 20%. Global hypokinesis. There was prominent  trabeculation predominantly along the anterior wall, with diastolic  noncompacted/compacted ratio = 2. There was a small thrombus along  the apical anterior wall. There was mild to moderate left atrial  enlargement. Normal right ventricular size with mildly decreased  systolic function. Mild right atrial enlargement. There was  moderate tricuspid regurgitation and probably mild mitral  regurgitation. No aortic insufficiency or stenosis.  On delayed enhancement imaging, there was no myocardial delayed  enhancement.  Measurements:  LV EDV 192 mL  LV SV 39 mL  LV EF 20.4%  IMPRESSION:  1. Mildly dilated LV with severe systolic dysfunction, EF 20%, with  global hypokinesis.  2. Small LV thrombus along the apical anterior wall.  3. Prominently trabeculated anterior wall raises concern for LV  noncompaction. However, other walls do not show as prominent  trabeculation. The noncompacted/compacted ratio in diastole along  the anterior wall is of borderline significance (2).  4. Normal RV size with mildly decreased systolic function.  5. No myocardial delayed enhancement. Therefore, no definitive  evidence for prior MI, myocarditis or infiltrative disease.  Original Report Authenticated By: [id number]  EKG done on admission last evening shows sinus tachycardia and nonspecific T-wave abnormalities  Assessment/Plan: 1.  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 Known LV thrombus, recently on dual coverage with warfarin and Lovenox  3. 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  The patient is right-handed. 4. prior history of high blood pressure 5. diabetes mellitus  Recommendation: Agree with use of dopamine to help support blood pressure and keep it in the range of 120 -140 systolic in view of her recent thrombotic stroke.  Her medications for afterload reduction such as ACE inhibitor and carvedilol will be held.  We will continue with digoxin which will help support her blood pressure.  Currently she appears to be euvolemic.  Will follow daily weights and  use Lasix as needed.  Will follow with you.   Cassell Clement 06/20/2012, 11:42 AM     For a

## 2012-06-20 NOTE — ED Provider Notes (Signed)
History     CSN: [id number]  Arrival date & time 06/19/12  2325   First MD Initiated Contact with Patient 06/19/12 2354      Chief Complaint  Patient presents with  . Code Stroke    (Consider location/radiation/quality/duration/timing/severity/associated sxs/prior treatment) HPI This 46 year old female who was discharged today for recent admission for new-onset nonischemic cardiomyopathy with ejection fraction 15% with left ventricular thrombus discharged on Lovenox and Coumadin, after presenting with a couple weeks of shortness of breath and fatigue, since discharge home today she was doing well until 10:00 this evening when she had a witnessed sudden onset slurred speech, expressive aphasia, and left hemiparesis which waxed and waned but improved significantly upon arrival with only mild dysarthria, mild expressive aphasia and mild left arm and leg and facial weakness upon arrival to the ED. A code stroke was called by EMS prior to arrival. The patient denied headache there is no confusion. She is no chest pain or shortness of breath. She is no nausea vomiting or vertigo. There is no treatment prior to arrival. Past Medical History  Diagnosis Date  . Migraines   . Hypertension   . Diabetes mellitus     History reviewed. No pertinent past surgical history.  History reviewed. No pertinent family history.  History  Substance Use Topics  . Smoking status: Never Smoker   . Smokeless tobacco: Never Used  . Alcohol Use: Yes     1 mixed drink every 6 months    OB History    Grav Para Term Preterm Abortions TAB SAB Ect Mult Living                  Review of Systems 10 Systems reviewed and are negative for acute change except as noted in the HPI. Allergies  Review of patient's allergies indicates no known allergies.  Home Medications   No current outpatient prescriptions on file.  BP 128/83  Pulse 120  Temp 99.3 F (37.4 C) (Core (Comment))  Resp 22  Ht 5\' 7"  (1.702  m)  Wt 173 lb 1 oz (78.5 kg)  BMI 27.11 kg/m2  SpO2 98%  Physical Exam  Nursing note and vitals reviewed. Constitutional: She is oriented to person, place, and time.       Awake, alert, nontoxic appearance with baseline speech for patient.  HENT:  Head: Atraumatic.  Mouth/Throat: No oropharyngeal exudate.  Eyes: EOM are normal. Pupils are equal, round, and reactive to light. Right eye exhibits no discharge. Left eye exhibits no discharge.  Neck: Neck supple.  Cardiovascular: Regular rhythm.   Murmur heard.      Tachycardic  Pulmonary/Chest: Effort normal and breath sounds normal. No stridor. No respiratory distress. She has no wheezes. She has no rales. She exhibits no tenderness.  Abdominal: Soft. Bowel sounds are normal. She exhibits no mass. There is no tenderness. There is no rebound.  Musculoskeletal: She exhibits edema. She exhibits no tenderness.       Baseline ROM, moves extremities with no obvious new focal weakness. Trace edema bilaterally  Lymphadenopathy:    She has no cervical adenopathy.  Neurological: She is alert and oriented to person, place, and time. A cranial nerve deficit is present.       Awake, alert, cooperative and aware of situation; motor strength 5/5 right arm and leg, 4/5 left arm and leg; sensation normal to light touch bilaterally; peripheral visual fields full to confrontation; slight left facial droop facial asymmetry; tongue midline; major cranial nerves otherwise  appear intact; minimal left arm and leg pronator drift, normal finger to nose bilaterally  Skin: No rash noted.  Psychiatric: She has a normal mood and affect.    ED Course  Procedures (including critical care time)  Patient had just taken Lovenox prior to arrival tonight as well as has a therapeutic INR on Coumadin so she was deemed not to be a candidate for intervention by neurology and not a candidate for TPA in stroke due to her systemic anticoagulation; while in the ED the patient  developed worsening symptoms with left hemi-neglect, left hemiplegia, is no longer tracking across the midline towards the left, significant left facial droop worsening, loss of ability to speak, and neurology discussed the case with cardiology as well as critical care medicine and critical care medicine will admit the patient to the intensive care unit.  ECG: Sinus tachycardia, ventricular rate 104, normal axis, normal intervals, nonspecific T wave changes, compared to 14 October the patient's ventricular rate has decreased now by approximately 20 beats per minute.  CRITICAL CARE Performed by: Hurman Horn   Total critical care time:  Critical care time was exclusive of separately billable procedures and treating other patients.  Critical care was necessary to treat or prevent imminent or life-threatening deterioration.  Critical care was time spent personally by me on the following activities: development of treatment plan with patient and/or surrogate as well as nursing, discussions with consultants, evaluation of patient's response to treatment, examination of patient, obtaining history from patient or surrogate, ordering and performing treatments and interventions, ordering and review of laboratory studies, ordering and review of radiographic studies, pulse oximetry and re-evaluation of patient's condition. Labs Reviewed  POCT I-STAT, CHEM 8 - Abnormal; Notable for the following:    Sodium 134 (*)     Glucose, Bld 267 (*)     All other components within normal limits  PROTIME-INR - Abnormal; Notable for the following:    Prothrombin Time 22.0 (*)     INR 2.01 (*)     All other components within normal limits  APTT - Abnormal; Notable for the following:    aPTT 49 (*)     All other components within normal limits  COMPREHENSIVE METABOLIC PANEL - Abnormal; Notable for the following:    Sodium 133 (*)     Glucose, Bld 268 (*)     Albumin 2.5 (*)     AST 61 (*)     ALT 184 (*)      All other components within normal limits  GLUCOSE, CAPILLARY - Abnormal; Notable for the following:    Glucose-Capillary 247 (*)     All other components within normal limits  BASIC METABOLIC PANEL - Abnormal; Notable for the following:    Sodium 132 (*)     Chloride 93 (*)     Glucose, Bld 269 (*)     Creatinine, Ser 0.32 (*)     All other components within normal limits  CBC - Abnormal; Notable for the following:    WBC 12.6 (*)     Hemoglobin 15.3 (*)     All other components within normal limits  POCT I-STAT TROPONIN I  CBC  DIFFERENTIAL  TROPONIN I  URINE RAPID DRUG SCREEN (HOSP PERFORMED)  MRSA PCR SCREENING  MAGNESIUM  PHOSPHORUS  MRSA PCR SCREENING  CBC  BASIC METABOLIC PANEL  PROTIME-INR   Ct Head Wo Contrast  06/20/2012  *RADIOLOGY REPORT*  Clinical Data: Increased aphasia and worsening left-sided weakness.  CT HEAD WITHOUT CONTRAST  Technique:  Contiguous axial images were obtained from the base of the skull through the vertex without contrast.  Comparison: 06/19/2012 at 2329 hours  Findings: The ventricles and sulci are symmetrical without significant effacement, displacement, or dilatation. No mass effect or midline shift. No abnormal extra-axial fluid collections. The grey-white matter junction is distinct. Basal cisterns are not effaced. No acute intracranial hemorrhage. No depressed skull fractures.  No significant change since previous study.  IMPRESSION: No acute intracranial abnormalities.  Stable appearance since previous study.   Original Report Authenticated By: Marlon Pel, M.D.    Ct Head Wo Contrast  06/19/2012  *RADIOLOGY REPORT*  Clinical Data: Code stroke, left-sided weakness, aphasia  CT HEAD WITHOUT CONTRAST  Technique:  Contiguous axial images were obtained from the base of the skull through the vertex without contrast.  Comparison: None.  Findings: No evidence of parenchymal hemorrhage or extra-axial fluid collection. No mass lesion, mass  effect, or midline shift.  No CT evidence of acute infarction.  Cerebral volume is age appropriate.  No ventriculomegaly.  The visualized paranasal sinuses are essentially clear. The mastoid air cells are unopacified.  No evidence of calvarial fracture.  IMPRESSION: No evidence of acute intracranial abnormality.  These results were called by telephone on 06/19/2012 at 1143 hours to Dr. Fonnie Jarvis, who verbally acknowledged these results.   Original Report Authenticated By: Charline Bills, M.D.    Dg Chest Port 1 View  06/20/2012  *RADIOLOGY REPORT*  Clinical Data: Right-sided CV L.  PORTABLE CHEST - 1 VIEW  Comparison: CT chest 06/15/2012  Findings: Right central venous catheter with tip over the low SVC region, allowing for patient rotation.  No pneumothorax.  Elevation of right hemidiaphragm.  Borderline heart size with normal pulmonary vascularity.  No focal airspace consolidation in the lungs.  No blunting of costophrenic angles.  IMPRESSION: Right central venous catheter with tip in the low SVC region.  No pneumothorax.   Original Report Authenticated By: Marlon Pel, M.D.      1. Stroke   2. Abnormal coagulation profile   3. Acute combined systolic and diastolic heart failure   4. Acute diastolic heart failure, NYHA class 3   5. Acute respiratory failure with hypoxia   6. LV (left ventricular) mural thrombus without MI       MDM  Patient / Family / Caregiver informed of clinical course, understand medical decision-making process, and agree with plan.        Hurman Horn, MD 06/20/12 [phone]

## 2012-06-20 NOTE — ED Notes (Signed)
Code stroke encoded: 2256; Code Stroke called: 2258; Patient arrival: 2325; EDP exam: 2325; Stroke team arrival: 2315; Last seen normal: 2235; Pt. Arrival in CT: 2330; Phlebotomist arrival: 2312; CT read: 2333; Code stroke cancelled: 2355

## 2012-06-20 NOTE — ED Notes (Addendum)
Nurse in to give pt medication and pt had a decline in mental status. Pt still alert, but aphasic. NIH reeval. MD made aware and in to examine pt

## 2012-06-20 NOTE — H&P (Signed)
Name: Margaret Hess MRN: [id number] DOB: [birth year]    LOS: 1  PULMONARY / CRITICAL CARE MEDICINE  HPI:  [name] is a 46 year-old woman with EF 17% and known LV thrombus who presented with stroke like symptoms including difficulty speaking and left sided weakness.  EMS was called, and a code stroke was activated.  Her symptoms initially resolved, but they did return, and she was admitted to the Neuro ICU for further evaluation.  She required pressors to keep SBP >140.  Past Medical History  Diagnosis Date  . Migraines   . Hypertension   . Diabetes mellitus    No past surgical history on file. Prior to Admission medications   Medication Sig Start Date End Date Taking? Authorizing Provider  buPROPion (WELLBUTRIN) 75 MG tablet Take 75 mg by mouth daily.    Yes Historical Provider, MD  carvedilol (COREG) 3.125 MG tablet Take 1 tablet (3.125 mg total) by mouth 2 (two) times daily with a meal. 06/19/12  Yes Lesle Chris Black, NP  digoxin (LANOXIN) 0.25 MG tablet Take 1 tablet (0.25 mg total) by mouth daily. 06/19/12  Yes Lesle Chris Black, NP  docusate sodium (COLACE) 100 MG capsule Take 1 capsule (100 mg total) by mouth 2 (two) times daily. 06/19/12  Yes Lesle Chris Black, NP  DULoxetine (CYMBALTA) 20 MG capsule Take 20 mg by mouth daily.   Yes Historical Provider, MD  enoxaparin (LOVENOX) 80 MG/0.8ML injection Inject 80mg  SQ q 12 hours for 5 doses. 06/19/12  Yes Lesle Chris Black, NP  ferrous sulfate (FERROUSUL) 325 (65 FE) MG tablet Take 1 tablet (325 mg total) by mouth 2 (two) times daily. 06/19/12  Yes Lesle Chris Black, NP  furosemide (LASIX) 20 MG tablet Take 1 tablet (20 mg total) by mouth daily. 06/19/12  Yes Lesle Chris Black, NP  Liraglutide (VICTOZA [place]) Inject 1.2 mLs into the skin daily.    Yes Historical Provider, MD  lisinopril (PRINIVIL,ZESTRIL) 10 MG tablet Take 1 tablet (10 mg total) by mouth 2 (two) times daily. 06/19/12  Yes Lesle Chris Black, NP  metFORMIN (GLUCOPHAGE) 500 MG tablet Take 500 mg by  mouth 2 (two) times daily with a meal.   Yes Historical Provider, MD  potassium chloride SA (K-DUR,KLOR-CON) 20 MEQ tablet Take 1 tablet (20 mEq total) by mouth 2 (two) times daily. 06/19/12  Yes Lesle Chris Black, NP  spironolactone (ALDACTONE) 25 MG tablet Take 1 tablet (25 mg total) by mouth daily. 06/19/12  Yes Lesle Chris Black, NP  warfarin (COUMADIN) 5 MG tablet Take 1 tablet (5 mg total) by mouth daily. 06/19/12  Yes Lesle Chris Black, NP  zolpidem (AMBIEN) 10 MG tablet Take 10 mg by mouth at bedtime as needed. For sleep   Yes Historical Provider, MD   Allergies No Known Allergies  Family History No family history on file. Social History  reports that she has never smoked. She has never used smokeless tobacco. She reports that she drinks alcohol. She reports that she does not use illicit drugs.  Review Of Systems:  Unable to provide due to aphasia.  Vital Signs: Temp:  [97 F (36.1 C)-98.4 F (36.9 C)] 97 F (36.1 C) (10/19 0415) Pulse Rate:  [98-109] 105  (10/19 0415) Resp:  [12-27] 18  (10/19 0415) BP: (110-147)/(66-113) 135/76 mmHg (10/19 0415) SpO2:  [94 %-100 %] 98 % (10/19 0415) Weight:  [80.876 kg (178 lb 4.8 oz)-80.9 kg (178 lb 5.6 oz)] 80.9 kg (178 lb 5.6 oz) (10/18 2348)  Physical Examination: General: No apparent distress. Eyes: Anicteric sclerae. ENT: Oropharynx clear. Moist mucous membranes. No thrush Lymph: No cervical, supraclavicular, or axillary lymphadenopathy. Heart: Normal S1, S2. No murmurs, rubs, or gallops appreciated. No bruits, equal pulses. Lungs: Normal excursion, no dullness to percussion. Good air movement bilaterally, without wheezes or crackles. Normal upper airway sounds without evidence of stridor. Abdomen: Abdomen soft, non-tender and not distended, normoactive bowel sounds. No hepatosplenomegaly or masses. Musculoskeletal: No clubbing or synovitis. 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 MI  Diabetes mellitus  Nonischemic cardiomyopathy   ASSESSMENT AND PLAN   CARDIOVASCULAR  Lab 06/19/12 2333 06/17/12 0455 06/16/12 1034 06/16/12 0515 06/15/12 2249 06/15/12 1929 06/15/12 1720  TROPONINI <0.30 -- <0.30 <0.30 <0.30 <0.30 --  LATICACIDVEN -- -- -- -- -- -- --  PROBNP -- 1930.0* -- -- -- -- 6932.0*   Lines: R subclavian CVC 10/19>>>  A: Relative hypotension in the setting of stroke P:  Goal SBP >140  BEST PRACTICE / DISPOSITION - Level of Care:  ICU - Primary Service:  PCCM - Consultants:  Neurology - Code Status:  Full - Diet:  Regular - DVT Px:  SCD's, therapeutic anticoagulation - GI Px:  Protonix - Social / Family:  Family updated at bedside  The patient is critically ill with multiple organ systems failure and requires high complexity decision making for assessment and support, frequent evaluation and titration of therapies, application of advanced monitoring technologies and extensive interpretation of multiple databases.   Critical Care Time devoted to patient care services described in this note is: 43 Minutes  Lavella Hammock, M.D. Pulmonary and Critical Care Medicine [facility] Pager: [phone]  06/20/2012, 4:26 AM

## 2012-06-20 NOTE — Consult Note (Signed)
Reason for Consult: Concern for stroke Referring Physician: Jinger Neighbors  CC: Difficulty speaking  History is obtained from: Patient, sister  HPI: Margaret Hess is an 46 y.o. female who was recently admitted to the medicine service with heart failure that was newly diagnosed on Monday. She was found to have a left ventricular thrombus, and started on anticoagulation. She was discharged earlier today with a therapeutic INR, as well as prescription for Lovenox.  She did resolve for Lovenox shot about an hour prior to symptoms starting.  At 10:30 PM, she noticed onset of left-sided weakness as well as difficulty speaking. EMS was called and they called a code stroke. In transit, she was seen to have improvement, most notably in her left-sided weakness, and her speech seemed clear some.  Last seen normal: 10:30 PM TPA given: No, INR greater than 1.7 Code stroke called 10:59pm, canceled 11:59pm.  ROS: Unable to fully obtain secondary to aphasia  Past Medical History  Diagnosis Date  . Migraines   . Hypertension   . Diabetes mellitus     Family History: No history of clotting disorder  Social History: Tob: Denies  Exam: Current vital signs: BP 120/93  Pulse 108  Temp 98.4 F (36.9 C) (Oral)  Resp 20  SpO2 97% Vital signs in last 24 hours: Temp:  [98.4 F (36.9 C)] 98.4 F (36.9 C) (10/18 2348) Pulse Rate:  [102-108] 108  (10/18 2348) Resp:  [18-20] 20  (10/18 2348) BP: (110-120)/(80-93) 120/93 mmHg (10/18 2348) SpO2:  [97 %-100 %] 97 % (10/18 2348) Weight:  [80.876 kg (178 lb 4.8 oz)] 80.876 kg (178 lb 4.8 oz) (10/18 0543)  General: On stretcher CV: Slightly tachycardic, but regular Mental Status: Patient follows commands immediately, but has a significant expressive aphasia. She is able to repeat, but has some mistakes. When reading she states that she understands what she is reading, but when reading aloud she has difficulty and makes mistakes. She is not clearly  dysarthric. She is awake, and alert Cranial Nerves: II: Visual Fields are notable for potential partial right upper field cut. Pupils are equal, round, and reactive to light.  Discs are difficult visual. III,IV, VI: EOMI without ptosis or diploplia.  V,VII: She has a left lower facial droop, and is able to feel bilaterally VIII: hearing is intact to voice X: Uvula elevates symmetrically XI: Shoulder shrug is symmetric. XII: tongue is midline without atrophy or fasciculations.  Motor: Tone is normal. Bulk is normal. She does not have drift in any of her 4 extremities, grip is equal bilaterally, 5/5 strength in her legs bilaterally. Sensory: Sensation is reduced on her right hemibody to light touch and pinprick Deep Tendon Reflexes: 2+ and symmetric in the biceps and patellae.  Plantars: Toes are downgoing bilaterally.  Cerebellar: FNF and HKS are intact bilaterally Gait: Not assessed due to patient safety concerns   I have reviewed labs in epic and the results pertinent to this consultation are: INR-2.01  I have reviewed the images obtained: CT head-no acute hemorrhage  Impression: 46 year old female with ischemic stroke affecting bilateral hemispheres. I suspect that she had an embolic shower from her cardiac thrombus. She initially had some improvement, and is now slightly worse again. Given that she is having some waxing and waning, I will go ahead and give her a dose of rectal aspirin to help with platelet inhibition. Also, would allow permissive hypertension to the point possible given her heart failure.  Recommendations: 1. HgbA1c, fasting lipid panel 2.  MRI, MRA  of the brain without contrast 3. PT consult, OT consult, Speech consult 4. Could consider repeat echocardiogram, but this will not change her anti-coagulation status.  5. Prophylactic therapy-Antiplatelet med: Theraputic on coumadin, will give ASA  6. Telemetry monitoring 7. Frequent neuro checks   Ritta Slot, MD [facility] [phone]  If 7pm- 7am, please page neurology on call at [phone].

## 2012-06-20 NOTE — ED Notes (Signed)
Vitals entered under wrong pt

## 2012-06-21 ENCOUNTER — Inpatient Hospital Stay (HOSPITAL_COMMUNITY): Payer: BC Managed Care – PPO

## 2012-06-21 DIAGNOSIS — R791 Abnormal coagulation profile: Secondary | ICD-10-CM

## 2012-06-21 DIAGNOSIS — J9600 Acute respiratory failure, unspecified whether with hypoxia or hypercapnia: Secondary | ICD-10-CM

## 2012-06-21 DIAGNOSIS — I5031 Acute diastolic (congestive) heart failure: Secondary | ICD-10-CM

## 2012-06-21 DIAGNOSIS — I5041 Acute combined systolic (congestive) and diastolic (congestive) heart failure: Secondary | ICD-10-CM

## 2012-06-21 LAB — GLUCOSE, CAPILLARY
Glucose-Capillary: 171 mg/dL — ABNORMAL HIGH (ref 70–99)
Glucose-Capillary: 213 mg/dL — ABNORMAL HIGH (ref 70–99)
Glucose-Capillary: 229 mg/dL — ABNORMAL HIGH (ref 70–99)

## 2012-06-21 LAB — CBC
HCT: 41.7 % (ref 36.0–46.0)
Hemoglobin: 15 g/dL (ref 12.0–15.0)
MCH: 30.4 pg (ref 26.0–34.0)
MCHC: 36 g/dL (ref 30.0–36.0)
MCV: 84.4 fL (ref 78.0–100.0)
RDW: 12.4 % (ref 11.5–15.5)

## 2012-06-21 LAB — BASIC METABOLIC PANEL
BUN: 11 mg/dL (ref 6–23)
Creatinine, Ser: 0.37 mg/dL — ABNORMAL LOW (ref 0.50–1.10)
GFR calc non Af Amer: >90 mL/min (ref >90)
Glucose, Bld: 210 mg/dL — ABNORMAL HIGH (ref 70–99)
Potassium: 4.2 mEq/L (ref 3.5–5.1)

## 2012-06-21 MED ORDER — ACETAMINOPHEN 325 MG PO TABS
650.0000 mg | ORAL_TABLET | ORAL | Status: DC | PRN
  Administered 2012-06-21: 650 mg via ORAL
  Filled 2012-06-21: qty 2

## 2012-06-21 MED ORDER — ASPIRIN 325 MG PO TABS
325.0000 mg | ORAL_TABLET | Freq: Every day | ORAL | Status: DC
  Administered 2012-06-21 – 2012-06-22 (×2): 325 mg via ORAL
  Filled 2012-06-21 (×3): qty 1

## 2012-06-21 MED ORDER — KETOROLAC TROMETHAMINE 30 MG/ML IJ SOLN
30.0000 mg | Freq: Once | INTRAMUSCULAR | Status: AC
  Administered 2012-06-21: 30 mg via INTRAVENOUS
  Filled 2012-06-21: qty 1

## 2012-06-21 NOTE — Progress Notes (Signed)
Subjective: Patient looks around randomly.   Objective: Filed Vitals:   06/21/12 0930 06/21/12 0945 06/21/12 1000 06/21/12 1015  BP: 120/77 118/73 115/71 113/78  Pulse: 99 94 93 95  Temp: 97.9 F (36.6 C) 97.5 F (36.4 C) 97.9 F (36.6 C) 98.1 F (36.7 C)  TempSrc:      Resp: 20     Height:      Weight:      SpO2: 96% 95% 97% 99%   Weight change: -4 lb 13.6 oz (-2.2 kg)  Intake/Output Summary (Last 24 hours) at 06/21/12 1044 Last data filed at 06/21/12 1000  Gross per 24 hour  Intake 1439.5 ml  Output   1480 ml  Net  -40.5 ml    General: Awake.  Does not focus   Neck:  JVP is normal Heart: Regular rate and rhythm, without murmurs, rubs, gallops.  Lungs: Clear to auscultation.  No rales or wheezes. Exemities:  No edema.  Feet cool Neuro: Deferred   Tele:  SR 90s    Lab Results: Results for orders placed during the hospital encounter of 06/19/12 (from the past 24 hour(s))  GLUCOSE, CAPILLARY     Status: Abnormal   Collection Time   06/20/12 12:17 PM      Component Value Range   Glucose-Capillary 254 (*) 70 - 99 mg/dL   Comment 1 Notify RN    GLUCOSE, CAPILLARY     Status: Abnormal   Collection Time   06/20/12  4:36 PM      Component Value Range   Glucose-Capillary 247 (*) 70 - 99 mg/dL   Comment 1 Notify RN    GLUCOSE, CAPILLARY     Status: Abnormal   Collection Time   06/20/12 10:17 PM      Component Value Range   Glucose-Capillary 222 (*) 70 - 99 mg/dL  CBC     Status: Abnormal   Collection Time   06/21/12  4:34 AM      Component Value Range   WBC 13.6 (*) 4.0 - 10.5 K/uL   RBC 4.94  3.87 - 5.11 MIL/uL   Hemoglobin 15.0  12.0 - 15.0 g/dL   HCT 14.7  82.9 - 56.2 %   MCV 84.4  78.0 - 100.0 fL   MCH 30.4  26.0 - 34.0 pg   MCHC 36.0  30.0 - 36.0 g/dL   RDW 13.0  86.5 - 78.4 %   Platelets 255  150 - 400 K/uL  BASIC METABOLIC PANEL     Status: Abnormal   Collection Time   06/21/12  4:34 AM      Component Value Range   Sodium 136  135 - 145 mEq/L   Potassium 4.2  3.5 - 5.1 mEq/L   Chloride 97  96 - 112 mEq/L   CO2 24  19 - 32 mEq/L   Glucose, Bld 210 (*) 70 - 99 mg/dL   BUN 11  6 - 23 mg/dL   Creatinine, Ser 6.96 (*) 0.50 - 1.10 mg/dL   Calcium 9.0  8.4 - 29.5 mg/dL   GFR calc non Af Amer >90  >90 mL/min   GFR calc Af Amer >90  >90 mL/min  PROTIME-INR     Status: Abnormal   Collection Time   06/21/12  4:34 AM      Component Value Range   Prothrombin Time 21.9 (*) 11.6 - 15.2 seconds   INR 2.00 (*) 0.00 - 1.49  GLUCOSE, CAPILLARY     Status: Abnormal  Collection Time   06/21/12  4:49 AM      Component Value Range   Glucose-Capillary 171 (*) 70 - 99 mg/dL    Studies/Results: @RISRSLT24 @  Medications: Reviewed   Patient Active Hospital Problem List: Stroke, embolic (06/20/2012)   Assessment:  Neuro following  On anticoag  Nonischemic cardiomyopathy (06/17/2012)   Assessment: Volume status looks OK  Currently on dopamine for SBP though goal is 120 to 140  I am not sure with her depressed LVEF that she would meet this.  Realistically SBP would be 100 to 110. I would recomm trial of wean. Not on other agents for now (b blocker or ACE I)  Follow for now.   Continue digoxin  Strict I/Os   LOS: 2 days   Dietrich Pates 06/21/2012, 10:44 AM

## 2012-06-21 NOTE — Progress Notes (Signed)
Name: Margaret Hess MRN: [id number] DOB: 09/16/1965    LOS: 2  PULMONARY / CRITICAL CARE MEDICINE  HPI:  Margaret Hess is a 46 year-old woman with EF 17% and known LV thrombus who presented with stroke like symptoms including difficulty speaking and left sided weakness.  EMS was called, and a code stroke was activated.  Her symptoms initially resolved, but they did return, and she was admitted to the Neuro ICU for further evaluation.  She required pressors to keep SBP >120-140.    Vital Signs: Temp:  [97.7 F (36.5 C)-99.3 F (37.4 C)] 98.1 F (36.7 C) (10/20 0915) Pulse Rate:  [97-125] 97  (10/20 0915) Resp:  [16-27] 18  (10/20 0915) BP: (92-148)/(39-90) 122/77 mmHg (10/20 0915) SpO2:  [94 %-100 %] 95 % (10/20 0915) Weight:  [78.7 kg (173 lb 8 oz)] 78.7 kg (173 lb 8 oz) (10/20 0455)  Physical Examination: General: No apparent distress. Eyes: Anicteric sclerae. ENT: Oropharynx clear. Moist mucous membranes. No thrush Lymph: No cervical, supraclavicular, or axillary lymphadenopathy. Heart: Normal S1, S2. No murmurs, rubs, or gallops appreciated. No bruits, equal pulses. Lungs: Normal excursion, no dullness to percussion. Good air movement bilaterally, without wheezes or crackles. Normal upper airway sounds without evidence of stridor. Abdomen: Abdomen soft, non-tender and not distended, normoactive bowel sounds. No hepatosplenomegaly or masses. Musculoskeletal: No clubbing or synovitis. Skin: No rashes or lesions Neuro: Dense left hemiplegia, expressive aphasia. Follows commands with right side.  Principal Problem:  *Stroke, embolic Active Problems:  HTN (hypertension)  LV (left ventricular) mural thrombus without MI  Diabetes mellitus  Nonischemic cardiomyopathy   ASSESSMENT AND PLAN   CARDIOVASCULAR  Lab 06/19/12 2333 06/17/12 0455 06/16/12 1034 06/16/12 0515 06/15/12 2249 06/15/12 1929 06/15/12 1720  TROPONINI <0.30 -- <0.30 <0.30 <0.30 <0.30 --  LATICACIDVEN -- --  -- -- -- -- --  PROBNP -- 1930.0* -- -- -- -- 6932.0*   Lines: R subclavian CVC 10/19>>>  A: Relative hypotension in the setting of stroke(per neuro) P:  Goal SBP >120-140 with dopa  Neurological: A: Dense left hemiplegia and expressive aphasia  P: -per neuro -bp maintain between 120-140 - DM CBG (last 3)   Basename 06/21/12 0449 06/20/12 2217 06/20/12 1636  GLUCAP 171* 222* 247*     A: DM  P: -ssi  CARDIAC: A: LV thrombus/EF 17%/HTN Lab Results  Component Value Date   INR 2.00* 06/21/2012   INR 2.01* 06/19/2012   INR 2.15* 06/19/2012     P: -coumadin per neuro  -per cards   BEST PRACTICE / DISPOSITION - Level of Care:  ICU - Primary Service:  PCCM - Consultants:  Neurology - Code Status:  Full - Diet:  Regular - DVT Px:  SCD's, therapeutic anticoagulation - GI Px:  Protonix - Social / Family:  None at bedside  Margaret Fischer, MD ; [facility] service Mobile [phone].  After 5:30 PM or weekends, call [phone]

## 2012-06-21 NOTE — Evaluation (Signed)
Physical Therapy Evaluation Patient Details Name: Margaret Hess MRN: [id number] DOB: [birth year] Today's Date: 06/21/2012 Time: 4098-1191 PT Time Calculation (min): 27 min  PT Assessment / Plan / Recommendation Clinical Impression  Pt admitted with L sided weakness, found to have right MCA infarct with parietal and frontal lobe involvement. Pt currently with expressive aphasia, unknown if receptive as well, left sided weakness, decreased balance and decreased awareness. Pt will benefit from skilled PT in the acute care setting in order to maximize functional mobility and strengthening for a safe d/c. Pt will make an excellent rehab candidate as she is motivated for further improvements.     PT Assessment  Patient needs continued PT services    Follow Up Recommendations  Post acute inpatient    Does the patient have the potential to tolerate intense rehabilitation   Yes, Recommend IP Rehab Screening  Barriers to Discharge        Equipment Recommendations  Rolling walker with 5" wheels    Recommendations for Other Services Rehab consult   Frequency Min 4X/week    Precautions / Restrictions Precautions Precautions: Fall Restrictions Weight Bearing Restrictions: No   Pertinent Vitals/Pain Pt with no complaints of pain.       Mobility  Bed Mobility Bed Mobility: Supine to Sit;Sitting - Scoot to [place] of Bed;Sit to Supine Rolling Right: 2: Max assist Rolling Left: 4: Min assist Supine to Sit: 1: +2 Total assist;HOB elevated;With rails Supine to Sit: Patient Percentage: 20% Sit to Supine: 1: +2 Total assist Sit to Supine: Patient Percentage: 10% Details for Bed Mobility Assistance: VC for proper sequencing. Pt with good movement during rolling using her R extremities. Assist through trunk and LLE during transfers. Upon standing, pt requested to lie back down. Transfers Transfers: Not assessed Ambulation/Gait Ambulation/Gait Assistance: Not tested (comment) Modified  Rankin (Stroke Patients Only) Pre-Morbid Rankin Score: No symptoms Modified Rankin: Severe disability    Shoulder Instructions     Exercises     PT Diagnosis: Difficulty walking;Hemiplegia non-dominant side  PT Problem List: Decreased strength;Decreased activity tolerance;Decreased balance;Decreased mobility;Decreased knowledge of use of DME;Decreased safety awareness;Decreased knowledge of precautions PT Treatment Interventions: DME instruction;Gait training;Functional mobility training;Therapeutic activities;Balance training;Neuromuscular re-education;Patient/family education   PT Goals Acute Rehab PT Goals PT Goal Formulation: With patient Time For Goal Achievement: 07/05/12 Potential to Achieve Goals: Fair Pt will go Supine/Side to Sit: with min assist PT Goal: Supine/Side to Sit - Progress: Goal set today Pt will go Sit to Supine/Side: with min assist PT Goal: Sit to Supine/Side - Progress: Goal set today Pt will go Sit to Stand: with mod assist PT Goal: Sit to Stand - Progress: Goal set today Pt will go Stand to Sit: with mod assist PT Goal: Stand to Sit - Progress: Goal set today Pt will Transfer Bed to Chair/Chair to Bed: with max assist PT Transfer Goal: Bed to Chair/Chair to Bed - Progress: Goal set today  Visit Information  Last PT Received On: 06/21/12 Assistance Needed: +2    Subjective Data  Patient Stated Goal: pt nonverbal, no gaols given   Prior Functioning  Home Living Lives With: Alone Available Help at Discharge: Family;Available PRN/intermittently;Friend(s) Type of Home: Apartment Home Access: Level entry Home Layout: One level Bathroom Shower/Tub: Tub/shower unit;Curtain Firefighter: Standard Bathroom Accessibility: Yes How Accessible: Accessible via walker Home Adaptive Equipment: None Prior Function Level of Independence: Independent Able to Take Stairs?: Yes Driving: Yes Vocation: Full time employment Comments: professor at [employer] Communication: Expressive difficulties;Receptive  difficulties Dominant Hand: Right    Cognition  Overall Cognitive Status: Difficult to assess Difficult to assess due to: Impaired communication Arousal/Alertness: Lethargic Orientation Level: Other (comment) (unknown) Behavior During Session: Flat affect    Extremity/Trunk Assessment Right Lower Extremity Assessment RLE ROM/Strength/Tone: Within functional levels RLE Sensation: WFL - Light Touch Left Lower Extremity Assessment LLE ROM/Strength/Tone: Deficits;Unable to fully assess;Due to impaired cognition LLE ROM/Strength/Tone Deficits: Pt with minimal movement of LLE. Unable to determine full strength secondary to cognition   Balance Balance Balance Assessed: Yes Static Sitting Balance Static Sitting - Balance Support: Bilateral upper extremity supported;Feet supported Static Sitting - Level of Assistance: 3: Mod assist Static Sitting - Comment/# of Minutes: Pt sitting at EOB for ~3 minutes with assist secondary to posterior and left lateral lean.  End of Session PT - End of Session Activity Tolerance: Patient limited by fatigue Patient left: in bed;with call bell/phone within reach;with nursing in room Nurse Communication: Mobility status    Milana Kidney 06/21/2012, 5:28 PM  06/21/2012 Milana Kidney DPT PAGER: [phone] OFFICE: [phone]

## 2012-06-21 NOTE — Progress Notes (Signed)
Margaret Hess is an 46 y.o. female was admitted 06/19/2012, for congestive heart failure, EF 17%, she has a left ventricular thrombus, was started on anticoagulation. She was discharged earlier 10/18 with a therapeutic INR   At 10:30 PM 10/18, she had acute onset of left-sided weakness as well as difficulty speaking. EMS was called and they called a code stroke. In transit, she was seen to have improvement, most notably in her left-sided weakness, and her speech seemed clear some.   Her sister is at bedside, she is a community Retail buyer, now has comprehensive aphasia, dense flaccid left hemiparesis, CT-head x2 was negative.   MRI brain pending     Past Medical History   Diagnosis  Date   .  Migraines    .  Hypertension    .  Diabetes mellitus     Family History:  No history of clotting disorder  Social History:  Tob: Denies  Exam:  Current vital signs:  BP 133/80s.  Pulse 108  Temp 98.4 F (36.9 C) (Oral)  Resp 20  SpO2 97%  Vital signs in last 24 hours:  Temp: [98.4 F (36.9 C)] 98.4 F (36.9 C) (10/18 2348)  Pulse Rate: [102-108] 108 (10/18 2348)  Resp: [18-20] 20 (10/18 2348)  BP: (110-120)/(80-93) 120/93 mmHg (10/18 2348)  SpO2: [97 %-100 %] 97 % (10/18 2348)  Weight: [80.876 kg (178 lb 4.8 oz)] 80.876 kg (178 lb 4.8 oz) (10/18 0543)  General: lying in bed quietly CV: Slightly tachycardic, but regular  Mental Status:  She is awake, not following command  Cranial Nerves:    III,IV, VI:  Left visual field deficit V,VII: She has a left lower facial droop,  VIII: hearing is intact to voice  X: Uvula elevates symmetrically  XI: Shoulder shrug decreased at left side. XII: tongue is midline without atrophy or fasciculations.  Motor:  Dense flaccid left hemiparesis, left arm 0, left leg 0. Sensory:  Responsive to pain at both side. Deep Tendon Reflexes:  Hyporeflexia at left. Plantars:  Toes are downgoing right, upgoing left.  Cerebellar:  FNF and HKS are  intact bilaterally  Gait:  Deferred.     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    1. MRI, MRA of the brain without contrast  2. Appreciate cardiologist input, agree wean off dopamine. 3. PT consult, OT consult, Speech consult  4.  Keep  coumadin,   ASA was added on.

## 2012-06-21 NOTE — Progress Notes (Signed)
Speech Language Pathology Treatment Patient Details Name: Dejanee Thibeaux MRN: [id number] DOB: 09-Jan-1966 Today's Date: 06/21/2012 Time: 7846-9629 SLP Time Calculation (min): 48 min  Assessment / Plan / Recommendation Clinical Impression  Patient seen for diagnostic treatment for diet tolerance/advancement of dysphagia 1 and thin liquids following initial BSE completed on 10/19.  Improvement in area of oral prep and oral stage.  Moderate verbal, tactile, and visual cues required for patient to complete strategies to decrease pocketing on left of trial solids.  No outward s/s of aspiration noted with thin liquid by cup/straw or trial solids.  Recommend to upgrade to dysphagia 3 but continue full supervison with all meals due to continued cognitive deficits.   Patient also seen for speech treatment to improve functional communication .  Patient correctly answered 3/10 personal information questions with extended response time.  Decreased initiation and  perseveration during targeted automatic speech tasks.  Basic wants communicated best when given  two choices.  Patient unable to correctly write simple words and name to dictation.  Patient aware of errors as she would cross out attempts with pen.   Patint's sister present during treatment and reports patient prefers to be called "Beth".  ST to continue in acute care setting for diet tolerance and to improve cognitive- linguistic skills.     SLP Plan  Continue with current plan of care       SLP Goals  SLP Goals Potential to Achieve Goals: Good Potential Considerations: Previous level of function;Family/community support Progress/Goals/Alternative treatment plan discussed with pt/caregiver and they: Agree SLP Goal #1 - Progress: Progressing toward goal SLP Goal #2 - Progress: Progressing toward goal SLP Goal #3 - Progress: Progressing toward goal  General Temperature Spikes Noted: No Respiratory Status: Room air Behavior/Cognition:  Alert;Agitated;Confused;Requires cueing;Distractible;Decreased sustained attention Oral Cavity - Dentition: Adequate natural dentition Patient Positioning: Upright in bed  Oral Cavity - Oral Hygiene Brush patient's teeth BID with toothbrush (using toothpaste with fluoride): Yes   Treatment Treatment focused on: Aphasia;Patient/family/caregiver education (dysphagia) Family/Caregiver Educated: Patient's sister   Doreene Nest MS, CCC-SLP [phone] [facility] 06/21/2012, 12:27 PM

## 2012-06-21 NOTE — Progress Notes (Signed)
VASCULAR LAB PRELIMINARY  PRELIMINARY  PRELIMINARY  PRELIMINARY  Carotid Dopplers completed.    Preliminary report:  No ICA stenosis.  Vertebral artery flow is antegrade.  Gayle Martinez, 06/21/2012, 11:07 AM

## 2012-06-22 DIAGNOSIS — I428 Other cardiomyopathies: Secondary | ICD-10-CM

## 2012-06-22 DIAGNOSIS — I6340 Cerebral infarction due to embolism of unspecified cerebral artery: Principal | ICD-10-CM

## 2012-06-22 LAB — CBC
HCT: 39.7 % (ref 36.0–46.0)
Hemoglobin: 13.9 g/dL (ref 12.0–15.0)
MCH: 30.2 pg (ref 26.0–34.0)
MCHC: 35 g/dL (ref 30.0–36.0)
RDW: 12.8 % (ref 11.5–15.5)

## 2012-06-22 LAB — GLUCOSE, CAPILLARY
Glucose-Capillary: 143 mg/dL — ABNORMAL HIGH (ref 70–99)
Glucose-Capillary: 158 mg/dL — ABNORMAL HIGH (ref 70–99)

## 2012-06-22 LAB — BASIC METABOLIC PANEL
BUN: 18 mg/dL (ref 6–23)
Calcium: 8.9 mg/dL (ref 8.4–10.5)
GFR calc Af Amer: >90 mL/min (ref >90)
GFR calc non Af Amer: >90 mL/min (ref >90)
Glucose, Bld: 144 mg/dL — ABNORMAL HIGH (ref 70–99)
Potassium: 4.2 mEq/L (ref 3.5–5.1)

## 2012-06-22 LAB — UIFE/LIGHT CHAINS/TP QN, 24-HR UR
Albumin, U: DETECTED
Beta, Urine: NOT DETECTED
Free Lambda Lt Chains,Ur: 0.14 mg/dL (ref 0.02–0.67)
Gamma Globulin, Urine: NOT DETECTED

## 2012-06-22 LAB — PROTIME-INR
INR: 2.6 — ABNORMAL HIGH (ref 0.00–1.49)
Prothrombin Time: 26.6 seconds — ABNORMAL HIGH (ref 11.6–15.2)

## 2012-06-22 MED ORDER — WARFARIN SODIUM 2.5 MG PO TABS
2.5000 mg | ORAL_TABLET | Freq: Once | ORAL | Status: AC
  Administered 2012-06-22: 2.5 mg via ORAL
  Filled 2012-06-22: qty 1

## 2012-06-22 MED ORDER — WARFARIN - PHARMACIST DOSING INPATIENT
Freq: Every day | Status: DC
  Administered 2012-06-22: 18:00:00

## 2012-06-22 NOTE — Progress Notes (Signed)
SLP has reviewed and agrees with student's note below.  Margaret Hess [name] M.Ed CCC-SLP Pager [phone]  06/22/2012  

## 2012-06-22 NOTE — Progress Notes (Signed)
Rehab Admissions Coordinator Note:  Patient was screened by Brock Ra for appropriateness for an Inpatient Acute Rehab Consult.  Pt w/ multiple medical issues-currently on IV Dopamine.  She is participating w/ therapies.  At this time, we are recommending Inpatient Rehab consult.     Brock Ra 06/22/2012, 8:39 AM  I can be reached at [phone].

## 2012-06-22 NOTE — Progress Notes (Signed)
Advanced Heart Failure Rounding Note   Subjective:    46 yo history of DM,  HTN, NICM,  EF <20%.  LV thrombus, and Grade 3 diastolic dysfunction.  Mod TR.   Discharged from [facility] 06/19/12 after being treated for acute systolic heart failure and LV thrombus. Discharged on loveneox and coumadin. HF medications.   Presented to ED 06/20/12 with difficulty speaking and L sided weakness. CT of head negative. 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  Remains with dense expressive aphasia and L sided hemiparesis. Unable to speak this am. Can wiggle L toes but no other movement. Dopamine off. HF meds still on hold. SBP 110-120. Denies orthopnea, PND. Weight stable.      Objective:     Vital Signs:   Temp:  [98.1 F (36.7 C)-98.8 F (37.1 C)] 98.2 F (36.8 C) (10/21 0700) Pulse Rate:  [81-113] 103  (10/21 1000) Resp:  [15-42] 18  (10/21 1000) BP: (80-137)/(48-92) 117/73 mmHg (10/21 1000) SpO2:  [92 %-100 %] 99 % (10/21 1000) Last BM Date: 06/19/12  Weight change: Filed Weights   06/19/12 2348 06/20/12 0307 06/21/12 0455  Weight: 80.9 kg (178 lb 5.6 oz) 78.5 kg (173 lb 1 oz) 78.7 kg (173 lb 8 oz)    Intake/Output:   Intake/Output Summary (Last 24 hours) at 06/22/12 1042 Last data filed at 06/22/12 0800  Gross per 24 hour  Intake 546.25 ml  Output    430 ml  Net 116.25 ml     Physical Exam:            General:  Well appearing. No resp difficulty HEENT: normal Neck: supple. JVP flat.  Carotids 2+ bilat; no bruits. No lymphadenopathy or thryomegaly appreciated. Cor: PMI nondisplaced. Tachycardic No gallops, rubs, or murmurs. Lungs: clear Abdomen: soft, nontender, nondistended. No hepatosplenomegaly. No bruits or masses. Good bowel sounds. Extremities: no cyanosis, clubbing, rash, edema Neuro: alert & orientedx3, cranial nerves grossly intact. moves all 4 extremities w/o difficulty. Affect  pleasant     Telemetry: Sinus tach 100s  Labs: Basic Metabolic Panel:  Lab 06/22/12 4696 06/21/12 0434 06/20/12 0619 06/19/12 2333 06/19/12 2332 06/19/12 0610  NA 130* 136 132* 134* 133* --  K 4.2 4.2 4.3 4.7 4.7 --  CL 97 97 93* 100 98 --  CO2 21 24 25  -- 22 26  GLUCOSE 144* 210* 269* 267* 268* --  BUN 18 11 12 14 15  --  CREATININE 0.56 0.37* 0.32* 0.80 0.59 --  CALCIUM 8.9 9.0 9.6 -- -- --  MG -- -- 1.8 -- -- --  PHOS -- -- 3.7 -- -- --    Liver Function Tests:  Lab 06/19/12 2332 06/17/12 0455 06/15/12 1720  AST 61* 149* 522*  ALT 184* 348* 639*  ALKPHOS 55 51 54  BILITOT 0.3 0.5 1.1  PROT 6.5 5.5* 6.7  ALBUMIN 2.5* 2.2* 2.8*   No results found for this basename: LIPASE:5,AMYLASE:5 in the last 168 hours No results found for this basename: AMMONIA:3 in the last 168 hours  CBC:  Lab 06/22/12 0332 06/21/12 0434 06/20/12 0619 06/19/12 2333 06/19/12 2332 06/19/12 0610 06/15/12 1720  WBC 13.2* 13.6* 12.6* -- 10.1 11.1* --  NEUTROABS -- -- -- -- 5.4 -- 5.6  HGB 13.9 15.0 15.3* 13.6 13.8 -- --  HCT 39.7 41.7 43.5 40.0 40.1 -- --  MCV 86.3 84.4 85.6 -- 87.0 88.3 --  PLT 217 255 235 --  217 206 --    Cardiac Enzymes:  Lab 06/19/12 2333 06/16/12 1034 06/16/12 0515 06/15/12 2249 06/15/12 1929  CKTOTAL -- -- -- -- 47  CKMB -- -- -- -- 2.7  CKMBINDEX -- -- -- -- --  TROPONINI <0.30 <0.30 <0.30 <0.30 <0.30    BNP: BNP (last 3 results)  Basename 06/17/12 0455 06/15/12 1720  PROBNP 1930.0* 6932.0*       Imaging: Mr Maxine Glenn Head Wo Contrast  06/21/2012  *RADIOLOGY REPORT*  Clinical Data:  Left-sided weakness.  Stroke.  MRI HEAD WITHOUT CONTRAST MRA HEAD WITHOUT CONTRAST  Technique:  Multiplanar, multiecho pulse sequences of the brain and surrounding structures were obtained without intravenous contrast. Angiographic images of the head were obtained using MRA technique without contrast.  Comparison:  CT 06/20/2012  MRI HEAD  Findings:  Acute right MCA infarct.  Acute  infarct involves the posterior internal capsule on the right, right putamen, right caudate, and  there is involvement of much of the insular cortex with extension into the right parietal cortex.  Small amount of infarct in the right frontal lobe.  There is hemorrhage in the right temporal lobe and in the right putamen.  Mild mass effect and midline shift of approximately 3 mm.  No acute infarct on the left.  Ventricle size is normal.  Brainstem and cerebellum are normal.  IMPRESSION: Acute hemorrhagic infarct in the right middle cerebral artery territory.  There is mass effect and mild midline shift of approximately 3 mm.  MRA HEAD  Findings: Both vertebral arteries are patent to the basilar.  The basilar is patent.  Superior cerebellar and posterior cerebral arteries are patent bilaterally.  Internal carotid artery is patent bilaterally.  Mild stenosis or hypoplasia of the left A1 segment.  Both anterior cerebral arteries are primarily supplied from the right A1 segment.  The left middle cerebral artery is normal.  There is abrupt occlusion of the right M1 segment compatible with an embolic infarct.  IMPRESSION: Acute occlusion of the right M1 segment compatible with an embolus causing right MCA infarction.   Original Report Authenticated By: Camelia Phenes, M.D.    Mr Brain Wo Contrast  06/21/2012  *RADIOLOGY REPORT*  Clinical Data:  Left-sided weakness.  Stroke.  MRI HEAD WITHOUT CONTRAST MRA HEAD WITHOUT CONTRAST  Technique:  Multiplanar, multiecho pulse sequences of the brain and surrounding structures were obtained without intravenous contrast. Angiographic images of the head were obtained using MRA technique without contrast.  Comparison:  CT 06/20/2012  MRI HEAD  Findings:  Acute right MCA infarct.  Acute infarct involves the posterior internal capsule on the right, right putamen, right caudate, and  there is involvement of much of the insular cortex with extension into the right parietal cortex.  Small  amount of infarct in the right frontal lobe.  There is hemorrhage in the right temporal lobe and in the right putamen.  Mild mass effect and midline shift of approximately 3 mm.  No acute infarct on the left.  Ventricle size is normal.  Brainstem and cerebellum are normal.  IMPRESSION: Acute hemorrhagic infarct in the right middle cerebral artery territory.  There is mass effect and mild midline shift of approximately 3 mm.  MRA HEAD  Findings: Both vertebral arteries are patent to the basilar.  The basilar is patent.  Superior cerebellar and posterior cerebral arteries are patent bilaterally.  Internal carotid artery is patent bilaterally.  Mild stenosis or hypoplasia of the left A1 segment.  Both anterior cerebral  arteries are primarily supplied from the right A1 segment.  The left middle cerebral artery is normal.  There is abrupt occlusion of the right M1 segment compatible with an embolic infarct.  IMPRESSION: Acute occlusion of the right M1 segment compatible with an embolus causing right MCA infarction.   Original Report Authenticated By: Camelia Phenes, M.D.    Dg Chest Port 1 View  06/21/2012  *RADIOLOGY REPORT*  Clinical Data: Respiratory distress  PORTABLE CHEST - 1 VIEW  Comparison: 06/20/2012  Findings: Heart size is normal.  No pleural effusion or edema.  The lung volumes are low.  No airspace opacity.  Right subclavian central venous catheter is noted with tip in the right atrium.  IMPRESSION:  1.  No acute cardiopulmonary abnormalities noted. 2.  Low lung volumes.   Original Report Authenticated By: Rosealee Albee, M.D.      Medications:     Scheduled Medications:    . aspirin  325 mg Oral Daily  . digoxin  0.125 mg Oral Daily  . insulin aspart  0-15 Units Subcutaneous TID WC  . insulin aspart  0-5 Units Subcutaneous QHS  . ketorolac  30 mg Intravenous Once  . pantoprazole (PROTONIX) IV  40 mg Intravenous QHS  . sodium chloride  3 mL Intravenous Q12H  . warfarin  5 mg Oral  q1800  . Warfarin - Physician Dosing Inpatient   Does not apply q1800    Infusions:    . sodium chloride 20 mL/hr at 06/20/12 1500  . DOPamine Stopped (06/21/12 1800)    PRN Medications: sodium chloride, sodium chloride, acetaminophen, albuterol, sodium chloride   Assessment:  1. CVA, acute hemorrhagic R MCA    --c/b expressive aphasia and L-sided hemiparesis 2. 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 NICM, EF <20% 4.  LV thrombus  5. DM   6. Hypertension  Plan/Discussion:    She has had a large R MCA stroke with dense expressive aphasia and L-hemiparesis. I discussed this with her and her sister and showed them the MRI images.  From HF standpoint she is stable. BP ok. No volume overload. Will continue to hold HF meds for now. May try to restart low-dose meds in a day or two when OK with Neuro. (please let us know)  Coumadin management per Neuro. Pharmacy to dose.   Arvilla Meres, MD 06/22/2012 10:42 AM

## 2012-06-22 NOTE — Evaluation (Signed)
Occupational Therapy Evaluation Patient Details Name: Margaret Hess MRN: [id number] DOB: 1965-09-27 Today's Date: 06/22/2012 Time: 6644-0347 OT Time Calculation (min): 32 min  OT Assessment / Plan / Recommendation Clinical Impression  This 46 yo female admitted with difficulty speaking and left sided weakness presents to acute OT with the problems below. Will benefit from acute OT with follow up OT at inpatient rehab    OT Assessment  Patient needs continued OT Services    Follow Up Recommendations  Inpatient Rehab    Barriers to Discharge None    Equipment Recommendations  Other (comment) (TBD)       Frequency  Min 3X/week    Precautions / Restrictions Precautions Precautions: Fall Restrictions Weight Bearing Restrictions: No       ADL  Eating/Feeding: Simulated;Set up;Supervision/safety Where Assessed - Eating/Feeding: Chair Grooming: Performed;Brushing hair;Set up;Supervision/safety (mod A for sitting balance) Where Assessed - Grooming: Supported sitting Upper Body Bathing: Simulated;Minimal assistance (with Mod A for sitting balance) Where Assessed - Upper Body Bathing: Supported sitting Lower Body Bathing: Simulated;Moderate assistance (with total A +2 (60%) for sit to stand and standing) Where Assessed - Lower Body Bathing: Supported sit to stand Upper Body Dressing: Simulated;Maximal assistance (Mod A for balance) Where Assessed - Upper Body Dressing: Supported sitting Lower Body Dressing: Simulated;Maximal assistance (total A +2(60%) sit to stand and standing) Where Assessed - Lower Body Dressing: Supported sit to stand Toilet Transfer: Simulated;+2 Total assistance Toilet Transfer: Patient Percentage: 60% Statistician Method: Ambulance person:  (from bed to recliner going to pt's right side) Toileting - Clothing Manipulation and Hygiene: Simulated;Moderate assistance Where Assessed - Toileting Clothing Manipulation and Hygiene: Sit  on 3-in-1 or toilet Equipment Used: Gait belt Transfers/Ambulation Related to ADLs: total A+2 (60%) sit to stand and stand to sit.    OT Diagnosis: Generalized weakness;Hemiplegia non-dominant side;Disturbance of vision  OT Problem List: Decreased strength;Decreased range of motion;Impaired balance (sitting and/or standing);Impaired vision/perception;Decreased cognition;Decreased safety awareness;Decreased knowledge of use of DME or AE;Impaired sensation;Impaired UE functional use OT Treatment Interventions: Self-care/ADL training;DME and/or AE instruction;Therapeutic activities;Cognitive remediation/compensation;Visual/perceptual remediation/compensation;Patient/family education;Balance training   OT Goals Acute Rehab OT Goals OT Goal Formulation: With patient Time For Goal Achievement: 07/06/12 Potential to Achieve Goals: Good ADL Goals Pt Will Perform Grooming: with min assist;Unsupported;Sitting, edge of bed (2 tasks, and min A for balance) ADL Goal: Grooming - Progress: Goal set today Pt Will Transfer to Toilet: with mod assist;Stand pivot transfer;3-in-1 ADL Goal: Toilet Transfer - Progress: Goal set today Miscellaneous OT Goals Miscellaneous OT Goal #1: Pt will attend to left arm before and after transfers with Mod cuing OT Goal: Miscellaneous Goal #1 - Progress: Goal set today Miscellaneous OT Goal #2: Pt will be able to locate items in room/on table/on a page with min cuing to work on vision OT Goal: Miscellaneous Goal #2 - Progress: Goal set today Miscellaneous OT Goal #3: Pt will be min A to come up to sit EOB to the left OT Goal: Miscellaneous Goal #3 - Progress: Goal set today  Visit Information  Last OT Received On: 06/22/12 Assistance Needed: +2 PT/OT Co-Evaluation/Treatment: Yes    Subjective Data  Subjective: "I don't know"---delayed (when asked if she was 60?), "No" to blanket, "yes" to sheet Patient Stated Goal: Unable   Prior Functioning     Home  Living Lives With: Alone Available Help at Discharge: Family;Available PRN/intermittently;Friend(s) Type of Home: Apartment Home Access: Level entry Home Layout: One level Bathroom Shower/Tub: Engineer, manufacturing systems:  Standard Bathroom Accessibility: Yes How Accessible: Accessible via walker Home Adaptive Equipment: None Prior Function Level of Independence: Independent Able to Take Stairs?: Yes Driving: Yes 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 disassociating head from eyes)   Cognition  Overall Cognitive Status: Difficult to assess Difficult to assess due to: Impaired communication Arousal/Alertness: Awake/alert Behavior During Session: WFL for tasks performed Cognition - Other Comments: impulsive    Extremity/Trunk Assessment Right Upper Extremity Assessment RUE ROM/Strength/Tone: Within functional levels Left Upper Extremity Assessment LUE ROM/Strength/Tone: Deficits LUE ROM/Strength/Tone Deficits: Brunstrum 0     Mobility Bed Mobility Bed Mobility: Rolling Left;Left Sidelying to Sit;Sitting - Scoot to Edge of Bed Rolling Left: 5: Set up;With rail Left Sidelying to Sit: 1: +2 Total assist;With rails;HOB flat Left Sidelying to Sit: Patient Percentage: 0% Sitting - Scoot to Edge of Bed: 1: +2 Total assist Sitting - Scoot to Edge of Bed: Patient Percentage: 30% Details for Bed Mobility Assistance: rolling required set-up for moving LUE away from body and tactile cue to bend RLE; side to sit, attempted verbal and tactile cues for pt to use  RLE to assist LLE off EOB and pt unable; scooting pt requires assist to maintain her balance (initially leaning to Lt) and assist to scoot Lt hip forward to get feet to floor; pt moving very impulsively once realized she was to scoot to EOB with excessive anterior lean/hip flexion with no righting reaction as she nearly fell forward Transfers Sit to Stand: 1: +2 Total assist;With upper extremity assist;From bed Sit to Stand: Patient Percentage: 60% Stand to Sit: 1: +2 Total assist;With upper extremity assist;To bed Stand to Sit: Patient Percentage: 60% Details for Transfer Assistance: pt initially impulsive with attempts to stand; stood with support to Lt knee (she was able to assist with extending LLE) and support to maintain midline (leaning to Lt); stand-pivot to her Rt with pt able to reach to far armrest to assist with pivoting on Rt foot (2nd person for safety)           Balance Balance Balance Assessed: Yes Static Sitting Balance Static Sitting - Balance Support: No upper extremity supported;Feet supported Static Sitting - Level of Assistance: 3: Mod assist Static Sitting - Comment/# of Minutes: incr assist initially and as pt attempted to scoot her pelvis to her Rt; once not sitting on the "hump" of bed frame, pt able to maintain static balance with minguard assist x 30 sec Dynamic Sitting Balance Dynamic Sitting - Balance Support: No upper extremity supported;Feet supported Dynamic Sitting - Level of Assistance: 4: Min assist Reach (Patient is able to reach ___ inches to right, left, forward, back): 6 inches to Rt Static Standing Balance Static Standing - Balance Support: Right upper extremity supported Static Standing - Level of Assistance: 1: +2 Total assist;Patient percentage (comment) (pt= 70) Static Standing - Comment/# of Minutes: stood ~ 2 minutes; Lt knee flexes, yet with tactile and verbal cues, pt can extend knee and maintain for 1-2 seconds   End of Session OT - End of  Session Equipment Utilized During Treatment: Gait belt Activity Tolerance: Patient tolerated treatment well Patient left: in chair;with call bell/phone within reach;with family/visitor present (  sister) Nurse Communication: Mobility status (see PT note)       Evette Georges [phone] 06/22/2012, 3:42 PM

## 2012-06-22 NOTE — Progress Notes (Signed)
UR completed 

## 2012-06-22 NOTE — Progress Notes (Signed)
Chaplain noticed pt was sitting in chair beside bed and asked if this would be a good time to visit. Pt declined to visit at this time. Chaplain said he would come back tomorrow.

## 2012-06-22 NOTE — Progress Notes (Signed)
Speech Language Pathology Treatment Patient Details Name: Margaret Hess MRN: [id number] DOB: [birth year] Today's Date: 06/22/2012 Time: 0920-0950 SLP Time Calculation (min): 30 min  Assessment / Plan / Recommendation Clinical Impression  Pt. seen for cognitive-linguistic and dysphagia treatment. Improved oral awareness of bolus today, Pt. cleared buccal residue without cues. No outward s/s of aspiration observed with thin liquids. Recommend continuing Dys 3. Diet with thin liquids. Treatment focused on facilitation of receptive and expressive communication. Decreased verbal perseveration noted today with responses at the single word level.  Pt. answered biographical/temporal yes/no questions with mod verbal/visual cues at 70% accuracy . Difficulty with consistent responses as pt. would verbally state "no" and nod her head "yes" simultaneously.  Pt. answered responsive naming questions (biographical/general)  with mod-max verbal and visual cues with 70% accuracy. She exhibited mild emergent awareness to verbal errors and minimal emergent awareness for comprehension difficulties.Pt. would benefit from continued ST to facilitate functional communication while in acute care setting.    SLP Plan  Continue with current plan of care       SLP Goals  SLP Goals Potential to Achieve Goals: Good Potential Considerations: Previous level of function;Family/community support Progress/Goals/Alternative treatment plan discussed with pt/caregiver and they: Agree SLP Goal #1: Correctly answer yes/no questions with various complexity 8/10 with moderate verbal cues.  SLP Goal #1 - Progress: Progressing toward goal SLP Goal #2: Increase intelligibility of speech to sentence level with moderate assist to utilize recommended speech strategies.  i.e. slow rate of speech,  increased vocal volume.  SLP Goal #3: Answer personal information questions with decreased perseveration with moderate verbal cues.  SLP Goal #3  - Progress: Progressing toward goal SLP Goal #4: Pt. will communicate basic wants/thoughts via multimodalities with moderate verbal/visual cueing.  General Temperature Spikes Noted: No Respiratory Status: Room air Behavior/Cognition: Alert;Cooperative;Distractible;Requires cueing Oral Cavity - Dentition: Adequate natural dentition Patient Positioning: Upright in bed  Oral Cavity - Oral Hygiene Does patient have any of the following "at risk" factors?: Other - dysphagia Brush patient's teeth BID with toothbrush (using toothpaste with fluoride): Yes Patient is AT RISK - Oral Care Protocol followed (see row info): Yes   Treatment Treatment focused on: Aphasia;Patient/family/caregiver education;Other (comment) (Dysphagia) Skilled Treatment: cueing hierarchy, increased response wait time   GO     Theotis Burrow 06/22/2012, 12:09 PM

## 2012-06-22 NOTE — Progress Notes (Signed)
Stroke Team Progress Note  HISTORY Margaret Hess is an 46 y.o. female who was recently admitted to the medicine service with heart failure that was newly diagnosed on Monday 06/15/2012. She was found to have a left ventricular thrombus, and started on anticoagulation. She was discharged earlier today 06/20/2012 with a therapeutic INR, as well as prescription for Lovenox.   She administered Lovenox shot about an hour prior to symptoms starting. At 10:30 PM, she noticed onset of left-sided weakness as well as difficulty speaking. EMS was called and they called a code stroke. In transit, she was seen to have improvement, most notably in her left-sided weakness, and her speech seemed clear some. Patient was not a TPA candidate secondary to INR greater than 1.7. She was admitted to the neuro ICU for further evaluation and treatment.  SUBJECTIVE Her sister is at the bedside.  Overall she feels her condition is stable. Off dopamine since yesterday at 6p.  OBJECTIVE Most recent Vital Signs: Filed Vitals:   06/22/12 0400 06/22/12 0500 06/22/12 0600 06/22/12 0700  BP: 125/70 121/73 137/92   Pulse: 93 98 113   Temp: 98.7 F (37.1 C)   98.2 F (36.8 C)  TempSrc: Oral   Oral  Resp: 18 17 16    Height:      Weight:      SpO2: 99% 97% 98%    CBG (last 3)   Basename 06/21/12 2143 06/21/12 1614 06/21/12 1159  GLUCAP 108* 140* 229*    IV Fluid Intake:     . sodium chloride 20 mL/hr at 06/20/12 1500  . DOPamine Stopped (06/21/12 1800)    MEDICATIONS    . aspirin  325 mg Oral Daily  . digoxin  0.125 mg Oral Daily  . insulin aspart  0-15 Units Subcutaneous TID WC  . insulin aspart  0-5 Units Subcutaneous QHS  . ketorolac  30 mg Intravenous Once  . pantoprazole (PROTONIX) IV  40 mg Intravenous QHS  . sodium chloride  3 mL Intravenous Q12H  . warfarin  5 mg Oral q1800  . Warfarin - Physician Dosing Inpatient   Does not apply q1800  . DISCONTD: aspirin  300 mg Rectal Daily   PRN:  sodium  chloride, sodium chloride, acetaminophen, albuterol, sodium chloride  Diet:  Dysphagia 3 thin liquids Activity:  Out of Bed  DVT Prophylaxis:  SCDs   CLINICALLY SIGNIFICANT STUDIES Basic Metabolic Panel:  Lab 06/22/12 1610 06/21/12 0434 06/20/12 0619  NA 130* 136 --  K 4.2 4.2 --  CL 97 97 --  CO2 21 24 --  GLUCOSE 144* 210* --  BUN 18 11 --  CREATININE 0.56 0.37* --  CALCIUM 8.9 9.0 --  MG -- -- 1.8  PHOS -- -- 3.7   Liver Function Tests:  Lab 06/19/12 2332 06/17/12 0455  AST 61* 149*  ALT 184* 348*  ALKPHOS 55 51  BILITOT 0.3 0.5  PROT 6.5 5.5*  ALBUMIN 2.5* 2.2*   CBC:  Lab 06/22/12 0332 06/21/12 0434 06/19/12 2332 06/15/12 1720  WBC 13.2* 13.6* -- --  NEUTROABS -- -- 5.4 5.6  HGB 13.9 15.0 -- --  HCT 39.7 41.7 -- --  MCV 86.3 84.4 -- --  PLT 217 255 -- --   Coagulation:  Lab 06/22/12 0332 06/21/12 0434 06/19/12 2332 06/19/12 0610  LABPROT 26.6* 21.9* 22.0* 23.1*  INR 2.60* 2.00* 2.01* 2.15*   Cardiac Enzymes:  Lab 06/19/12 2333 06/16/12 1034 06/16/12 0515 06/15/12 1929  CKTOTAL -- -- -- 47  CKMB -- -- -- 2.7  CKMBINDEX -- -- -- --  TROPONINI <0.30 <0.30 <0.30 --   Urinalysis: No results found for this basename: COLORURINE:2,APPERANCEUR:2,LABSPEC:2,PHURINE:2,GLUCOSEU:2,HGBUR:2,BILIRUBINUR:2,KETONESUR:2,PROTEINUR:2,UROBILINOGEN:2,NITRITE:2,LEUKOCYTESUR:2 in the last 168 hours Lipid Panel    Component Value Date/Time   CHOL 121 06/17/2012 0455   TRIG 169* 06/17/2012 0455   HDL 23* 06/17/2012 0455   CHOLHDL 5.3 06/17/2012 0455   VLDL 34 06/17/2012 0455   LDLCALC 64 06/17/2012 0455   HgbA1C  Lab Results  Component Value Date   HGBA1C 7.0* 06/17/2012    Urine Drug Screen:     Component Value Date/Time   LABOPIA NONE DETECTED 06/20/2012 0138   COCAINSCRNUR NONE DETECTED 06/20/2012 0138   LABBENZ NONE DETECTED 06/20/2012 0138   AMPHETMU NONE DETECTED 06/20/2012 0138   THCU NONE DETECTED 06/20/2012 0138   LABBARB NONE DETECTED 06/20/2012 0138      Alcohol Level: No results found for this basename: ETH:2 in the last 168 hours  CT of the brain   06/20/2012 No acute intracranial abnormalities. Stable appearance since previous study. 06/19/2012 No evidence of acute intracranial abnormality.  MRI of the brain  06/21/2012 Acute hemorrhagic infarct in the right middle cerebral artery territory.  There is mass effect and mild midline shift of approximately 3 mm.  MRA of the brain  06/21/2012  Acute occlusion of the right M1 segment compatible with an embolus causing right MCA infarction.   2D Echocardiogram  06/16/2012 EF < 20%.Severe global hypokinesis with minimal regional variation. There appears to be a non-mobile, calcified mass measuring 1.0 x 1.5 cm on the apical lateral wall most consistent with organized thrombus.  Carotid Doppler  No evidence of hemodynamically significant internal carotid artery stenosis. Vertebral artery flow is antegrade.   CXR  06/20/2012 Right central venous catheter with tip in the low SVC region. No pneumothorax. 06/21/2012   1.  No acute cardiopulmonary abnormalities noted. 2.  Low lung volumes.   EKG  sinus tachycardia.   Therapy Recommendations PT - CIR; OT - ; ST - CIR  Physical Exam  General: lying in bed quietly  CV: Slightly tachycardic, but regular  Mental Status:  She is awake, not following command  Cranial Nerves:  III,IV, VI: Left visual field deficit  V,VII: She has a left lower facial droop,  VIII: hearing is intact to voice  X: Uvula elevates symmetrically  XI: Shoulder shrug decreased at left side.  XII: tongue is midline without atrophy or fasciculations.  Motor:  Dense flaccid left hemiparesis, left arm 0, left leg 0.  Sensory:  Responsive to pain at both side.  Deep Tendon Reflexes:  Hyporeflexia at left.  Plantars:  Toes are downgoing right, upgoing left.  Cerebellar:  FNF and HKS are intact bilaterally  Gait:  Deferred.  ASSESSMENT [name] is a 46 y.o.  female presenting with left sided weakness and aphasia in setting of acute left mural thrombus on coumadin and lovenox, with last lovenox dose within 1 hr, INR 2.01 on arrival. Imaging confirms a right MCA infarct with cerebral edema, mild midline shift and hemorrhagic transformation. Infarct felt to be embolic secondary to known LV mural thrombus.  Work up underway. On warfarin and and full dose lovenox prior to admission. Now on warfarin for secondary stroke prevention. 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 No indication for other bridging.)Patient is at high risk for hemorrhagic transformation into her stroke but DR in a very tricky situation and the need for ongoing continuation of anticoagulation in my opinion outweighs the risk of increasing hemorrhagic transformation with anticoagulation.  Consider inpatient rehab vs SNF. Only family is sister who lives 5 hrs away. Does have some support in town. Sister to start investigating further, tho she is overwhelmed.  CT head in am  Ok to transfer to the floor from neuro standpoint  Discuss with husband and Dr. Delton Coombes This patient is critically ill and at significant risk of neurological worsening, death and care requires constant monitoring of vital signs, hemodynamics,respiratory and cardiac monitoring,review of multiple databases, neurological assessment, discussion with family, other specialists and medical decision making of high complexity. I spent 30 minutes of neurocritical care time  in the care of  this patient.     Annie Main, MSN, RN, ANVP-BC, ANP-BC, GNP-BC [facility] Pager: [phone] 06/22/2012 8:21 AM  Scribe for Dr. Delia Heady, Stroke Center Medical Director, who has personally reviewed chart, pertinent data, examined the patient and developed the plan of care. Pager:  [phone]

## 2012-06-22 NOTE — Progress Notes (Signed)
Physical Therapy Treatment Patient Details Name: Margaret Hess MRN: [id number] DOB: 02/02/1966 Today's Date: 06/22/2012 Time: 0102-7253 PT Time Calculation (min): 35 min  PT Assessment / Plan / Recommendation Comments on Treatment Session  Pt admitted with L sided weakness, found to have right MCA infarct with parietal and frontal lobe involvement. Pt also with left ventricular thrombus (anticoagulated for this) and recently diagnosed heart failure with EF 17%. She appeared to understand some commands. Was not reliable with yes/no biographical questions. Appears very motivated. Sister present throughout and reports that pt will ultimately come home with her in [place], [place]    Follow Up Recommendations  Post acute inpatient     Does the patient have the potential to tolerate intense rehabilitation  Yes, Recommend IP Rehab Screening  Barriers to Discharge        Equipment Recommendations  Other (comment) (TBD)    Recommendations for Other Services Rehab consult  Frequency Min 4X/week   Plan Discharge plan remains appropriate;Frequency remains appropriate    Precautions / Restrictions Precautions Precautions: Fall Restrictions Weight Bearing Restrictions: No   Pertinent Vitals/Pain No pain indicated    Mobility  Bed Mobility Bed Mobility: Rolling Left;Left Sidelying to Sit;Sitting - Scoot to Edge of Bed Rolling Left: 5: Set up;With rail Left Sidelying to Sit: 1: +2 Total assist;With rails;HOB flat Left Sidelying to Sit: Patient Percentage: 0% Sitting - Scoot to Edge of Bed: 1: +2 Total assist Sitting - Scoot to Edge of Bed: Patient Percentage: 30% Details for Bed Mobility Assistance: rolling required set-up for moving LUE away from body and tactile cue to bend RLE; side to sit, attempted verbal and tactile cues for pt to use RLE to assist LLE off EOB and pt unable; scooting pt requires assist to maintain her balance (initially leaning to Lt) and assist to scoot Lt hip forward  to get feet to floor; pt moving very impulsively once realized she was to scoot to EOB with excessive anterior lean/hip flexion with no righting reaction as she nearly fell forward Transfers Transfers: Sit to Stand;Stand to Sit;Stand Pivot Transfers Sit to Stand: 1: +2 Total assist;With upper extremity assist;From bed Sit to Stand: Patient Percentage: 60% Stand to Sit: 1: +2 Total assist;With upper extremity assist;To bed Stand to Sit: Patient Percentage: 60% Stand Pivot Transfers: 1: +2 Total assist Stand Pivot Transfers: Patient Percentage: 60% Details for Transfer Assistance: pt initially impulsive with attempts to stand; stood with support to Lt knee (she was able to assist with extending LLE) and support to maintain midline (leaning to Lt); stand-pivot to her Rt with pt able to reach to far armrest to assist with pivoting on Rt foot (2nd person for safety) Ambulation/Gait Ambulation/Gait Assistance: Not tested (comment) Modified Rankin (Stroke Patients Only) Pre-Morbid Rankin Score: No symptoms Modified Rankin: Severe disability    Exercises     PT Diagnosis:    PT Problem List:   PT Treatment Interventions:     PT Goals Acute Rehab PT Goals Pt will go Supine/Side to Sit: with min assist PT Goal: Supine/Side to Sit - Progress: Progressing toward goal Pt will go Sit to Stand: with mod assist PT Goal: Sit to Stand - Progress: Progressing toward goal Pt will go Stand to Sit: with mod assist PT Goal: Stand to Sit - Progress: Progressing toward goal Pt will Transfer Bed to Chair/Chair to Bed: with max assist PT Transfer Goal: Bed to Chair/Chair to Bed - Progress: Progressing toward goal  Visit Information  Last PT Received On:  06/22/12 Assistance Needed: +2    Subjective Data  Subjective: "I don't know..." when asked if she was 46 yrs old.    Cognition  Overall Cognitive Status: Difficult to assess Difficult to assess due to: Impaired communication Arousal/Alertness:  Awake/alert Behavior During Session: WFL for tasks performed Cognition - Other Comments: impulsive    Balance  Balance Balance Assessed: Yes Static Sitting Balance Static Sitting - Balance Support: No upper extremity supported;Feet supported Static Sitting - Level of Assistance: 3: Mod assist Static Sitting - Comment/# of Minutes: incr assist initially and as pt attempted to scoot her pelvis to her Rt; once not sitting on the "hump" of bed frame, pt able to maintain static balance with minguard assist x 30 sec Dynamic Sitting Balance Dynamic Sitting - Balance Support: No upper extremity supported;Feet supported Dynamic Sitting - Level of Assistance: 4: Min assist Reach (Patient is able to reach ___ inches to right, left, forward, back): 6 inches to Rt Static Standing Balance Static Standing - Balance Support: Right upper extremity supported Static Standing - Level of Assistance: 1: +2 Total assist;Patient percentage (comment) (pt= 70) Static Standing - Comment/# of Minutes: stood ~ 2 minutes; Lt knee flexes, yet with tactile and verbal cues, pt can extend knee and maintain for 1-2 seconds  End of Session PT - End of Session Equipment Utilized During Treatment: Gait belt Activity Tolerance: Patient tolerated treatment well Patient left: in chair;with call bell/phone within reach;with family/visitor present Nurse Communication: Mobility status   GP     Seven Dollens 06/22/2012, 12:59 PM Pager [phone]

## 2012-06-22 NOTE — Progress Notes (Signed)
Name: Quaniyah Bugh MRN: [id number] DOB: May 05, 1966    LOS: 3  PULMONARY / CRITICAL CARE MEDICINE  HPI:  [name] is a 46 year-old woman with EF 17% and known LV thrombus who presented with stroke like symptoms including difficulty speaking and left sided weakness.  EMS was called, and a code stroke was activated.  Her symptoms initially resolved, but they did return, and she was admitted to the Neuro ICU for further evaluation.  She required pressors to keep SBP >120-140.  Vital Signs: Temp:  [97.5 F (36.4 C)-98.8 F (37.1 C)] 98.2 F (36.8 C) (10/21 0700) Pulse Rate:  [81-113] 99  (10/21 0800) Resp:  [16-42] 17  (10/21 0800) BP: (80-137)/(48-92) 122/80 mmHg (10/21 0800) SpO2:  [92 %-100 %] 97 % (10/21 0800)  Intake/Output Summary (Last 24 hours) at 06/22/12 0945 Last data filed at 06/22/12 0800  Gross per 24 hour  Intake 603.33 ml  Output    430 ml  Net 173.33 ml    Physical Examination: General: No apparent distress. Eyes: Anicteric sclerae. ENT: Oropharynx clear. Moist mucous membranes. No thrush Lymph: No cervical, supraclavicular, or axillary lymphadenopathy. Heart: Normal S1, S2. No murmurs, rubs, or gallops appreciated. No bruits, equal pulses. Lungs: Normal excursion, no dullness to percussion. Good air movement bilaterally, without wheezes or crackles. Normal upper airway sounds without evidence of stridor. Abdomen: Abdomen soft, non-tender and not distended, normoactive bowel sounds. No hepatosplenomegaly or masses. Musculoskeletal: No clubbing or synovitis. Skin: No rashes or lesions Neuro: Dense left hemiplegia, expressive aphasia. Follows commands with right side.  Principal Problem:  *Stroke, embolic Active Problems:  HTN (hypertension)  LV (left ventricular) mural thrombus without MI  Diabetes mellitus  Nonischemic cardiomyopathy  Cardiomyopathy   ASSESSMENT AND PLAN   CARDIOVASCULAR  Lab 06/19/12 2333 06/17/12 0455 06/16/12 1034 06/16/12 0515  06/15/12 2249 06/15/12 1929 06/15/12 1720  TROPONINI <0.30 -- <0.30 <0.30 <0.30 <0.30 --  LATICACIDVEN -- -- -- -- -- -- --  PROBNP -- 1930.0* -- -- -- -- 6932.0*   Lines: R subclavian CVC 10/19>>>  A: Relative hypotension in the setting of stroke (per neuro) P:  Goal SBP >120-140 with dopa  Neurological: A: Dense left hemiplegia and expressive aphasia P: -per neuro -bp maintain between 120-140   DM CBG (last 3)   Lab 06/21/12 2143 06/21/12 1614 06/21/12 1159 06/21/12 0749 06/21/12 0449  GLUCAP 108* 140* 229* 213* 171*   P: -ssi  CARDIAC: A: LV thrombus/EF 17%/HTN Lab Results  Component Value Date   INR 2.60* 06/22/2012   INR 2.00* 06/21/2012   INR 2.01* 06/19/2012  P: -coumadin per pharmacy -digoxin    BEST PRACTICE / DISPOSITION - Level of Care:  ICU >> To floor bed - Primary Service:  PCCM To [facility] as of 10/22 - Consultants:  Neurology - Code Status:  Full - Diet:  Regular - DVT Px:  SCD's, therapeutic anticoagulation - GI Px:  Protonix - Social / Family:  Sister at bedside  Levy Pupa, MD, PhD 06/22/2012, 9:50 AM [facility] [phone] or if no answer [phone]

## 2012-06-22 NOTE — Progress Notes (Addendum)
ANTICOAGULATION CONSULT NOTE - Follow Up Consult  Pharmacy Consult for Coumadin Indication: LV thrombus, new CVA  No Known Allergies  Patient Measurements: Height: 5\' 7"  (170.2 cm) Weight: 173 lb 8 oz (78.7 kg) IBW/kg (Calculated) : 61.6   Vital Signs: Temp: 98.2 F (36.8 C) (10/21 0700) Temp src: Oral (10/21 0700) BP: 121/69 mmHg (10/21 1100) Pulse Rate: 101  (10/21 1100)  Labs:  Basename 06/22/12 0332 06/21/12 0434 06/20/12 0619 06/19/12 2333 06/19/12 2332  HGB 13.9 15.0 -- -- --  HCT 39.7 41.7 43.5 -- --  PLT 217 255 235 -- --  APTT -- -- -- -- 49*  LABPROT 26.6* 21.9* -- -- 22.0*  INR 2.60* 2.00* -- -- 2.01*  HEPARINUNFRC -- -- -- -- --  CREATININE 0.56 0.37* 0.32* -- --  CKTOTAL -- -- -- -- --  CKMB -- -- -- -- --  TROPONINI -- -- -- <0.30 --    Estimated Creatinine Clearance: 95.9 ml/min (by C-G formula based on Cr of 0.56).  Assessment: 45y[facility] on coumadin 5mg  daily and lovenox 80mg  q12 for new LV thrombus. INR 2.15 at time of discharge after only 2 doses of coumadin. Returns to the hospital that same night with difficulty speaking and L sided weakness. 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   10/15: no INR, 7.5mg  10/16: INR 1.29, 7.5mg  10/17: INR 2.38, dose held 10/18: INR 2.15, 5mg  (taken at home) 10/19: no INR, 5mg  10/20: INR 2.00, 5mg  10/21: INR 2.6  Coumadin continued (ok by neuro) at home dose of 5mg  daily. INR is therapeutic today, but has jumped a little from yesterday so will decrease dose.  Goal of Therapy:  INR 2-3 Monitor platelets by anticoagulation protocol: Yes   Plan:  1) Coumadin 2.5mg  x 1 2) Follow up INR in AM  Fredrik Rigger 06/22/2012,12:41 PM

## 2012-06-23 ENCOUNTER — Inpatient Hospital Stay (HOSPITAL_COMMUNITY): Payer: BC Managed Care – PPO

## 2012-06-23 DIAGNOSIS — I5022 Chronic systolic (congestive) heart failure: Secondary | ICD-10-CM

## 2012-06-23 LAB — CBC
Hemoglobin: 15.2 g/dL — ABNORMAL HIGH (ref 12.0–15.0)
MCH: 30.1 pg (ref 26.0–34.0)
MCHC: 33.8 g/dL (ref 30.0–36.0)
MCV: 89.1 fL (ref 78.0–100.0)
RBC: 5.05 MIL/uL (ref 3.87–5.11)

## 2012-06-23 LAB — BASIC METABOLIC PANEL
BUN: 15 mg/dL (ref 6–23)
CO2: 25 mEq/L (ref 19–32)
Calcium: 9.1 mg/dL (ref 8.4–10.5)
Chloride: 97 mEq/L (ref 96–112)
Creatinine, Ser: 0.64 mg/dL (ref 0.50–1.10)

## 2012-06-23 LAB — PROTIME-INR
INR: 2.54 — ABNORMAL HIGH (ref 0.00–1.49)
Prothrombin Time: 26.1 seconds — ABNORMAL HIGH (ref 11.6–15.2)

## 2012-06-23 LAB — GLUCOSE, CAPILLARY: Glucose-Capillary: 185 mg/dL — ABNORMAL HIGH (ref 70–99)

## 2012-06-23 MED ORDER — PANTOPRAZOLE SODIUM 40 MG PO TBEC
40.0000 mg | DELAYED_RELEASE_TABLET | Freq: Every day | ORAL | Status: DC
  Administered 2012-06-23 – 2012-06-24 (×2): 40 mg via ORAL
  Filled 2012-06-23 (×2): qty 1

## 2012-06-23 MED ORDER — WARFARIN SODIUM 2.5 MG PO TABS
2.5000 mg | ORAL_TABLET | Freq: Once | ORAL | Status: AC
  Administered 2012-06-23: 2.5 mg via ORAL
  Filled 2012-06-23: qty 1

## 2012-06-23 MED ORDER — LISINOPRIL 2.5 MG PO TABS
2.5000 mg | ORAL_TABLET | Freq: Two times a day (BID) | ORAL | Status: DC
  Administered 2012-06-23 – 2012-06-24 (×3): 2.5 mg via ORAL
  Filled 2012-06-23 (×7): qty 1

## 2012-06-23 NOTE — Consult Note (Signed)
Physical Medicine and Rehabilitation Consult Reason for Consult: CVA Referring Physician: [facility]   HPI: Margaret Hess is a 46 y.o. right-handed female with history of migraine headaches, hypertension and diabetes mellitus with peripheral neuropathy and congestive heart failure. Patient initially presented 06/15/2012 with dyspnea found to have nonischemic cardiomyopathy with ejection fraction of 70%. An echocardiogram is completed showing severe global hypokinesis with normal left ventricular cavity size and a left ventricular apical thrombus. The patient had an MRI on 06/17/2012 which confirmed a small apical left ventricular thrombus. The patient was placed on Coumadin therapy and discharge from the hospital 06/19/2012 1 her INR was therapeutic. She was also given a prescription for 3 days of cross coverage with Lovenox 80 mg twice daily. On 06/19/2012 she developed acute onset of left-sided weakness and difficulty speaking. She was readmitted to the hospital 06/20/2012 with MRI of the brain showing acute hemorrhagic infarct in the right middle cerebral artery territory. There was mass effect and mild midline shift of approximately 3 mm. MRA of the head showed acute occlusion of the right M1 segment compatible with embolus causing right MCA infarction. Carotid Dopplers completed 06/21/2012 showing no significant ICA stenosis. Patient did not receive TPA. Followup neurology service as well as cardiology services advised to continue Coumadin therapy with latest INR 2.54. Noted admission INR of 2.15. She is presently on a dysphagia 3 thin liquid diet. Followup by physical therapy, occupational therapy and speech therapy with therapies ongoing and recommended physical medicine rehabilitation consult to consider inpatient rehabilitation services   Review of Systems  Unable to perform ROS  Past Medical History  Diagnosis Date  . Migraines   . Hypertension   . Diabetes mellitus    History reviewed. No  pertinent past surgical history. History reviewed. No pertinent family history. Social History:  reports that she has never smoked. She has never used smokeless tobacco. She reports that she drinks alcohol. She reports that she does not use illicit drugs. Allergies: No Known Allergies Medications Prior to Admission  Medication Sig Dispense Refill  . buPROPion (WELLBUTRIN) 75 MG tablet Take 75 mg by mouth daily.       . carvedilol (COREG) 3.125 MG tablet Take 1 tablet (3.125 mg total) by mouth 2 (two) times daily with a meal.  30 tablet  0  . digoxin (LANOXIN) 0.25 MG tablet Take 1 tablet (0.25 mg total) by mouth daily.  30 tablet  0  . docusate sodium (COLACE) 100 MG capsule Take 1 capsule (100 mg total) by mouth 2 (two) times daily.  10 capsule  0  . DULoxetine (CYMBALTA) 20 MG capsule Take 20 mg by mouth daily.      Marland Kitchen enoxaparin (LOVENOX) 80 MG/0.8ML injection Inject 80mg  SQ q 12 hours for 5 doses.  5 Syringe  0  . ferrous sulfate (FERROUSUL) 325 (65 FE) MG tablet Take 1 tablet (325 mg total) by mouth 2 (two) times daily.  30 tablet  3  . furosemide (LASIX) 20 MG tablet Take 1 tablet (20 mg total) by mouth daily.  30 tablet  0  . Liraglutide (VICTOZA [place]) Inject 1.2 mLs into the skin daily.       Marland Kitchen lisinopril (PRINIVIL,ZESTRIL) 10 MG tablet Take 1 tablet (10 mg total) by mouth 2 (two) times daily.  30 tablet  0  . metFORMIN (GLUCOPHAGE) 500 MG tablet Take 500 mg by mouth 2 (two) times daily with a meal.      . potassium chloride SA (K-DUR,KLOR-CON) 20 MEQ tablet  Take 1 tablet (20 mEq total) by mouth 2 (two) times daily.  30 tablet  0  . spironolactone (ALDACTONE) 25 MG tablet Take 1 tablet (25 mg total) by mouth daily.  30 tablet  0  . warfarin (COUMADIN) 5 MG tablet Take 1 tablet (5 mg total) by mouth daily.  2 tablet  0  . zolpidem (AMBIEN) 10 MG tablet Take 10 mg by mouth at bedtime as needed. 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[employer]) Comments: professor at [employer] Functional Status:  Mobility: Bed Mobility Bed Mobility: Rolling Left;Left Sidelying to [employer] Right: 2: Max assist Rolling Left: 5: Set up;With rail Left Sidelying to Sit: 1: +1 Total assist;HOB flat;With rails Left Sidelying to Sit: Patient Percentage: 10% Supine to Sit: 1: +2 Total assist;HOB elevated;With rails Supine to Sit: Patient Percentage: 20% Sitting - Scoot to Edge of Bed: 1: +2 Total assist Sitting - Scoot to Edge of Bed: Patient Percentage: 30% Sit to Supine: 1: +2 Total assist Sit to Supine: Patient Percentage: 10% Transfers Transfers: Sit to Stand;Stand to Sit;Stand Pivot Transfers Sit to Stand: 3: Mod assist;With upper extremity assist;From bed Sit to Stand: Patient Percentage: 60% Stand to Sit: 3: Mod assist;With upper extremity assist;To bed;To chair/3-in-1;With armrests Stand to Sit: Patient Percentage: 60% Stand Pivot Transfers: 3: Mod assist Stand Pivot Transfers: Patient Percentage: 60% Ambulation/Gait Ambulation/Gait Assistance: Not tested (comment)    ADL: ADL Eating/Feeding: Simulated;Set up;Supervision/safety Where Assessed - Eating/Feeding: Chair Grooming: Performed;Brushing hair;Set up;Supervision/safety (mod A for sitting balance) Where Assessed - Grooming: Supported sitting Upper Body Bathing: Simulated;Minimal assistance (with Mod A for sitting balance) Where Assessed - Upper Body Bathing: Supported sitting Lower Body Bathing: Simulated;Moderate assistance (with total A  +2 (60%) for sit to stand and standing) Where Assessed - Lower Body Bathing: Supported sit to stand Upper Body Dressing: Simulated;Maximal assistance (Mod A for balance) Where Assessed - Upper Body Dressing: Supported sitting Lower Body Dressing: Simulated;Maximal assistance (total A +2(60%) sit to stand and standing) Where Assessed - Lower Body Dressing: Supported sit to stand Toilet Transfer: Simulated;+2 Total assistance Toilet Transfer Method: Ambulance person:  (from bed to recliner going to pt's right side) Equipment Used: Gait belt Transfers/Ambulation Related to ADLs: total A+2 (60%) sit to stand and stand to sit.  Cognition: Cognition Overall Cognitive Status: Impaired Arousal/Alertness: Awake/alert Orientation Level: Oriented to person Irven Easterly) Attention: Focused Focused Attention: Impaired Focused Attention Impairment: Verbal basic;Functional basic Memory: Appears intact Awareness: Appears intact Problem Solving: Impaired Problem Solving Impairment: Verbal basic;Functional basic;Functional complex Executive Function: Initiating Initiating: Impaired Initiating Impairment: Verbal basic;Functional basic Behaviors: Restless;Impulsive;Perseveration;Verbal agitation;Poor frustration tolerance Safety/Judgment: Impaired Cognition Overall Cognitive Status: Difficult to assess Difficult to assess due to: Impaired communication Arousal/Alertness: Awake/alert Orientation Level: Other (comment) (unknown) Behavior During Session: [facility] for tasks performed Cognition - Other Comments:  (less impulsive)  Blood pressure 118/87, pulse 99, temperature 98.6 F (37 C), temperature source Oral, resp. rate 19, height 5\' 7"  (1.702 m), weight 75.9 kg (167 lb 5.3 oz), SpO2 99.00%. Physical Exam  Vitals reviewed. Constitutional: She appears well-developed.  HENT:  Head: Normocephalic.  Eyes:       Pupils round and reactive to light  Neck: Neck supple. No thyromegaly  present.  Cardiovascular: Normal rate.   Pulmonary/Chest: Breath sounds normal. No respiratory distress. She has no wheezes.  Abdominal: Bowel sounds are normal. She exhibits no distension. There is no tenderness.  Neurological: She is alert.       Patient is aphasic appears mostly to be expressive in nature. She did follow basic verbal commands as seeing touching her nose and her left ear. She was able to state her first name  Skin: Skin is warm and dry.  Has difficulty coordinating head gestures with yes no verbal responses Naming is poor Repetition is poor Motor 0/5 in the left deltoid, biceps, triceps, grip Motor 0/5 in the left hip flexor knee extensor ankle dorsiflexor and plantar flexor Motor 5/5 in the Right deltoid, biceps, triceps, grip Motor 5/5 in the right hip flexor knee extensor ankle dorsiflexor plantar flexor Sensation difficult to assess secondary to aphasia will react to a pinch on the nailbed of the left hand as well as left toes Visual fields difficult to assess but grossly appears to be intact  Results for orders placed during the hospital encounter of 06/19/12 (from the past 24 hour(s))  GLUCOSE, CAPILLARY     Status: Abnormal   Collection Time   06/22/12  3:58 PM      Component Value Range   Glucose-Capillary 190 (*) 70 - 99 mg/dL  GLUCOSE, CAPILLARY     Status: Abnormal   Collection Time   06/22/12 10:05 PM      Component Value Range   Glucose-Capillary 142 (*) 70 - 99 mg/dL  PROTIME-INR     Status: Abnormal   Collection Time   06/23/12  5:00 AM      Component Value Range   Prothrombin Time 26.1 (*) 11.6 - 15.2 seconds   INR 2.54 (*) 0.00 - 1.49  BASIC METABOLIC PANEL     Status: Abnormal   Collection Time   06/23/12  5:00 AM      Component Value Range   Sodium 135  135 - 145 mEq/L   Potassium 4.0  3.5 - 5.1 mEq/L   Chloride 97  96 - 112 mEq/L   CO2 25  19 - 32 mEq/L   Glucose, Bld 172 (*) 70 - 99 mg/dL   BUN 15  6 - 23 mg/dL   Creatinine, Ser 1.61   0.50 - 1.10 mg/dL   Calcium 9.1  8.4 - 09.6 mg/dL   GFR calc non Af Amer >90  >90 mL/min   GFR calc Af Amer >90  >90 mL/min  MAGNESIUM     Status: Normal   Collection Time   06/23/12  5:00 AM      Component Value Range   Magnesium 2.1  1.5 - 2.5 mg/dL  PHOSPHORUS     Status: Normal   Collection Time   06/23/12  5:00 AM      Component Value Range   Phosphorus 3.0  2.3 - 4.6 mg/dL  CBC     Status: Abnormal   Collection Time   06/23/12  5:00 AM      Component Value Range   WBC 13.8 (*) 4.0 - 10.5 K/uL   RBC 5.05  3.87 - 5.11 MIL/uL   Hemoglobin 15.2 (*) 12.0 - 15.0 g/dL   HCT 04.5  40.9 - 81.1 %   MCV 89.1  78.0 - 100.0 fL   MCH 30.1  26.0 - 34.0 pg   MCHC 33.8  30.0 - 36.0 g/dL   RDW 16.1  09.6 - 04.5 %   Platelets 325  150 - 400 K/uL   Ct Head Wo Contrast  06/23/2012  *RADIOLOGY REPORT*  Clinical Data: Stroke  CT HEAD WITHOUT CONTRAST  Technique:  Contiguous axial images were obtained from the base of the skull through the vertex without contrast.  Comparison: MRI 06/21/2012  Findings: Acute infarct in the right MCA territory again noted. This involves the   right insular cortex and approximately half of the right parietal cortex.  There is a hemorrhagic component in the right putamen.  The area of hemorrhage is unchanged measuring approximately 32 x 11 mm extending into the right caudate.  There is mass effect and swelling of the infarct with mild midline shift measuring approximately 4 mm.  There is increasing mass effect on the right lateral ventricle compared to the prior study.  No new areas of infarction or hemorrhage are identified.  No underlying mass lesion is seen.  IMPRESSION: Acute hemorrhagic infarction on the right  with  similar distribution to the prior MRI.  There is increased edema and mass effect compared with the recent MRI.   Original Report Authenticated By: Camelia Phenes, M.D.     Assessment/Plan: Diagnosis: Embolic right MCA infarct causing aphasia, apraxia,  Left flaccid hemi-paresis 1. Does the need for close, 24 hr/day medical supervision in concert with the patient's rehab needs make it unreasonable for this patient to be served in a less intensive setting? Yes 2. Co-Morbidities requiring supervision/potential complications: Systolic And diastolic congestive heart failure,Diabetes, nonischemic cardiomyopathy, hypertension 3. Due to bladder management, bowel management, safety, skin/wound care, disease management, medication administration, pain management and patient education, does the patient require 24 hr/day rehab nursing? Yes 4. Does the patient require coordinated care of a physician, rehab nurse, PT (1-2 hrs/day, 5 days/week), OT (1-2 hrs/day, 5 days/week) and SLP (0.5-1 hrs/day, 5 days/week) to address physical and functional deficits in the context of the above medical diagnosis(es)? Yes Addressing deficits in the following areas: balance, endurance, locomotion, strength, transferring, bowel/bladder control, bathing, dressing, feeding, grooming, toileting, cognition, speech, language, swallowing and psychosocial support 5. Can the patient actively participate in an intensive therapy program of at least 3 hrs of therapy per day at least 5 days per week? Yes 6. The potential for patient to make measurable gains while on inpatient rehab is good 7. Anticipated functional outcomes upon discharge from inpatient rehab are Min assist mobility with PT, Min assist ADLs with OT, Express basic needs, 90% accuracy with yes no questions, safety by mouth swallow with SLP. 8. Estimated rehab length of stay to reach the above functional goals is: 3 weeks 9. Does the patient have adequate social supports to accommodate these discharge functional goals? Potentially 10. Anticipated D/C setting: Home 11. Anticipated post D/C treatments: Outpt therapy 12. Overall Rehab/Functional Prognosis: good  RECOMMENDATIONS: This patient's condition is appropriate for  continued rehabilitative care in the following setting: CIR Patient has agreed to participate in recommended program. Potentially Note that insurance prior authorization may be required for reimbursement for recommended care.  Comment:    06/23/2012

## 2012-06-23 NOTE — Progress Notes (Signed)
ANTICOAGULATION CONSULT NOTE - Follow Up Consult  Pharmacy Consult for Coumadin Indication: LV thrombus  No Known Allergies  Patient Measurements: Height: 5\' 7"  (170.2 cm) Weight: 167 lb 5.3 oz (75.9 kg) IBW/kg (Calculated) : 61.6   Vital Signs: Temp: 98.6 F (37 C) (10/22 0400) Temp src: Oral (10/22 0400) BP: 125/75 mmHg (10/22 0400) Pulse Rate: 99  (10/22 0800)  Labs:  Basename 06/23/12 0500 06/22/12 0332 06/21/12 0434  HGB 15.2* 13.9 --  HCT 45.0 39.7 41.7  PLT 325 217 255  APTT -- -- --  LABPROT 26.1* 26.6* 21.9*  INR 2.54* 2.60* 2.00*  HEPARINUNFRC -- -- --  CREATININE -- 0.56 0.37*  CKTOTAL -- -- --  CKMB -- -- --  TROPONINI -- -- --    Estimated Creatinine Clearance: 93.4 ml/min (by C-G formula based on Cr of 0.56).  Assessment: 45yof [facility] on 10[facility]18 on coumadin 5mg  daily and lovenox 80mg  q12 for new LV thrombus. INR 2.15 at time of discharge after only 2 doses of coumadin. Returns to the hospital that same night with difficulty speaking and L sided weakness. 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   10/15: no INR, 7.5mg  10/16: INR 1.29, 7.5mg  10/17: INR 2.38, dose held 10/18: INR 2.15, 5mg  (taken at home) 10/19: no INR, 5mg  10/20: INR 2.00, 5mg  10/21: INR 2.6, 2.5mg  10/22 INR 2.54  Coumadin continued (ok by neuro) at home dose of 5mg  daily. INR is therapeutic today, but will continue lower dose for now due to hemorrhagic stroke aiming for lower end of goal ~2-2.5.  Goal of Therapy:  INR 2-3 Monitor platelets by anticoagulation protocol: Yes   Plan:  1) Coumadin 2.5mg  x 1 2) Follow up INR in AM   Drue Stager 06/23/2012,8:22 AM

## 2012-06-23 NOTE — Progress Notes (Signed)
Stroke Team Progress Note  HISTORY Margaret Hess is an 46 y.o. female who was recently admitted to the medicine service with heart failure that was newly diagnosed on Monday 06/15/2012. She was found to have a left ventricular thrombus, and started on anticoagulation. She was discharged earlier today 06/20/2012 with a therapeutic INR, as well as prescription for Lovenox.   She administered Lovenox shot about an hour prior to symptoms starting. At 10:30 PM, she noticed onset of left-sided weakness as well as difficulty speaking. EMS was called and they called a code stroke. In transit, she was seen to have improvement, most notably in her left-sided weakness, and her speech seemed clear some. Patient was not a TPA candidate secondary to INR greater than 1.7. She was admitted to the neuro ICU for further evaluation and treatment.  SUBJECTIVE  She is trying to talk today and is getting more words out. Sitting up in bed feeding herself.   OBJECTIVE Most recent Vital Signs: Filed Vitals:   06/22/12 2000 06/23/12 0000 06/23/12 0400 06/23/12 0500  BP: 137/84 141/91 125/75   Pulse: 108 106 96   Temp: 98.2 F (36.8 C) 98.1 F (36.7 C) 98.6 F (37 C)   TempSrc: Oral Oral Oral   Resp:  18 16   Height:      Weight:    75.9 kg (167 lb 5.3 oz)  SpO2: 99% 100% 100%    CBG (last 3)   Basename 06/22/12 2205 06/22/12 1558 06/22/12 0755  GLUCAP 142* 190* 132*    IV Fluid Intake:      . sodium chloride Stopped (06/22/12 1100)  . DOPamine Stopped (06/21/12 1800)    MEDICATIONS     . aspirin  325 mg Oral Daily  . digoxin  0.125 mg Oral Daily  . insulin aspart  0-15 Units Subcutaneous TID WC  . insulin aspart  0-5 Units Subcutaneous QHS  . pantoprazole (PROTONIX) IV  40 mg Intravenous QHS  . sodium chloride  3 mL Intravenous Q12H  . warfarin  2.5 mg Oral ONCE-1800  . Warfarin - Pharmacist Dosing Inpatient   Does not apply q1800  . DISCONTD: warfarin  5 mg Oral q1800  . DISCONTD:  Warfarin - Physician Dosing Inpatient   Does not apply q1800   PRN:  sodium chloride, sodium chloride, acetaminophen, albuterol, sodium chloride  Diet:  Dysphagia 3 thin liquids Activity:  Out of Bed  DVT Prophylaxis:  SCDs   CLINICALLY SIGNIFICANT STUDIES Basic Metabolic Panel:   Lab 06/22/12 0332 06/21/12 0434 06/20/12 0619  NA 130* 136 --  K 4.2 4.2 --  CL 97 97 --  CO2 21 24 --  GLUCOSE 144* 210* --  BUN 18 11 --  CREATININE 0.56 0.37* --  CALCIUM 8.9 9.0 --  MG -- -- 1.8  PHOS -- -- 3.7   Liver Function Tests:   Lab 06/19/12 2332 06/17/12 0455  AST 61* 149*  ALT 184* 348*  ALKPHOS 55 51  BILITOT 0.3 0.5  PROT 6.5 5.5*  ALBUMIN 2.5* 2.2*   CBC:   Lab 06/22/12 0332 06/21/12 0434 06/19/12 2332  WBC 13.2* 13.6* --  NEUTROABS -- -- 5.4  HGB 13.9 15.0 --  HCT 39.7 41.7 --  MCV 86.3 84.4 --  PLT 217 255 --   Coagulation:   Lab 06/22/12 0332 06/21/12 0434 06/19/12 2332 06/19/12 0610  LABPROT 26.6* 21.9* 22.0* 23.1*  INR 2.60* 2.00* 2.01* 2.15*   Cardiac Enzymes:   Lab 06/19/12 2333 06/16/12  1034  CKTOTAL -- --  CKMB -- --  CKMBINDEX -- --  TROPONINI <0.30 <0.30   Lipid Panel    Component Value Date/Time   CHOL 121 06/17/2012 0455   TRIG 169* 06/17/2012 0455   HDL 23* 06/17/2012 0455   CHOLHDL 5.3 06/17/2012 0455   VLDL 34 06/17/2012 0455   LDLCALC 64 06/17/2012 0455   HgbA1C  Lab Results  Component Value Date   HGBA1C 7.0* 06/17/2012    Urine Drug Screen:     Component Value Date/Time   LABOPIA NONE DETECTED 06/20/2012 0138   COCAINSCRNUR NONE DETECTED 06/20/2012 0138   LABBENZ NONE DETECTED 06/20/2012 0138   AMPHETMU NONE DETECTED 06/20/2012 0138   THCU NONE DETECTED 06/20/2012 0138   LABBARB NONE DETECTED 06/20/2012 0138    Alcohol Level: No results found for this basename: ETH:2 in the last 168 hours  CT of the brain   10/22/2013Acute hemorrhagic infarction on the right with similar distribution to the prior MRI 06/20/2012 No acute  intracranial abnormalities. Stable appearance since previous study. 06/19/2012 No evidence of acute intracranial abnormality.  MRI of the brain  06/21/2012 Acute hemorrhagic infarct in the right middle cerebral artery territory.  There is mass effect and mild midline shift of approximately 3 mm.  MRA of the brain  06/21/2012  Acute occlusion of the right M1 segment compatible with an embolus causing right MCA infarction.   2D Echocardiogram  06/16/2012 EF < 20%.Severe global hypokinesis with minimal regional variation. There appears to be a non-mobile, calcified mass measuring 1.0 x 1.5 cm on the apical lateral wall most consistent with organized thrombus.  Carotid Doppler  No evidence of hemodynamically significant internal carotid artery stenosis. Vertebral artery flow is antegrade.   CXR  06/20/2012 Right central venous catheter with tip in the low SVC region. No pneumothorax. 06/21/2012   1.  No acute cardiopulmonary abnormalities noted. 2.  Low lung volumes.   EKG  sinus tachycardia.   Therapy Recommendations PT - CIR; OT - ; ST - CIR  Physical Exam  General: lying in bed quietly  CV: Slightly tachycardic, but regular  Mental Status:  She is awake, not following command .expressive aphasia with nonfluent speech and speaks only monosyllables but not sentences. Cranial Nerves:  III,IV, VI: Left visual field deficit  V,VII: She has a left lower facial droop,  VIII: hearing is intact to voice  X: Uvula elevates symmetrically  XI: Shoulder shrug decreased at left side.  XII: tongue is midline without atrophy or fasciculations.  Motor:  Dense flaccid left hemiparesis, left arm 0, left leg 0.  Sensory:  Responsive to pain at both side.  Deep Tendon Reflexes:  Hyporeflexia at left.  Plantars:  Toes are downgoing right, upgoing left.  Cerebellar:  FNF and HKS are intact bilaterally  Gait:  Deferred.  ASSESSMENT [name] is a 46 y.o. female presenting with left sided  weakness and aphasia in setting of acute left mural thrombus on coumadin and lovenox, with last lovenox dose within 1 hr, INR 2.01 on arrival. Imaging confirms a right MCA infarct with cerebral edema, mild midline shift and hemorrhagic transformation. Infarct felt to be embolic secondary to known LV mural thrombus.  Work up underway. On warfarin and and full dose lovenox prior to admission. Now on warfarin for secondary stroke prevention. Patient with resultant left hemiplegia, expressive aphasia.(-aphasia is very rare with a right hemispheric stroke in a right-handed individual)   Non ischemic cardiomyopathy, EF < 20% on  Migraines Hypertension Diabetes, HgbA1c 7.0 LDL 64  Hospital day # 4  TREATMENT/PLAN  Continue warfarin for secondary stroke prevention given mural thrombus (aware of hemorrhage, it does not need to be reversed, and given thrombus recommend continuing with warfarin. No indication for other bridging.)Patient is at high risk for hemorrhagic transformation into her stroke but DR in a very tricky situation and the need for ongoing continuation of anticoagulation in my opinion outweighs the risk of increasing hemorrhagic transformation with anticoagulation. Goal INR is  2.0-3.0. Stop aspirin with INR > 2.0. Will stop aspirin.  Consider inpatient rehab vs SNF. Only family is sister who lives 5 hrs away. Does have some support in town. Sister to start investigating further, tho she is overwhelmed  Ok to start heart failure medications given current blood pressure ( ace or beta-blocker )  Ok to transfer to the floor from neuro standpoint This patient is critically ill and at significant risk of neurological worsening, death and care requires constant monitoring of vital signs, hemodynamics,respiratory and cardiac monitoring,review of multiple databases, neurological assessment, discussion with family, other specialists and medical decision making of high complexity. I spent 30 minutes  of neurocritical care time  in the care of  this patient.   Guy Franco, [facility],  MBA, MHA [facility] Pager: [phone] 06/23/2012 7:33 AM  Scribe for Dr. Delia Heady, Stroke Center Medical Director. He has personally reviewed chart, pertinent data, examined the patient and developed the plan of care. Pager:  [phone]

## 2012-06-23 NOTE — Progress Notes (Signed)
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 Pt also with left ventricular thrombus (anticoagulated for this) and recently diagnosed heart failure with EF 17%. Pt verbalizing more today and with incr movement of LUE noted (with AAROM). Pt less impulsive today and was able to transfer with +1 assist.     Follow Up Recommendations  Post acute inpatient     Does the patient have the potential to tolerate intense rehabilitation  Yes, Recommend IP Rehab Screening  Barriers to Discharge        Equipment Recommendations  Other (comment) (TBD)    Recommendations for Other Services Rehab consult  Frequency Min 4X/week   Plan Discharge plan remains appropriate;Frequency remains appropriate    Precautions / Restrictions Precautions Precautions: Fall   Pertinent Vitals/Pain Denied pain in LUE or LLE Vitals WNL    Mobility  Bed Mobility Bed Mobility: Rolling Left;Left Sidelying to Sit Rolling Left: 5: Set up;With rail Left Sidelying to Sit: 1: +1 Total assist;HOB flat;With rails Left Sidelying to Sit: Patient Percentage: 10% Details for Bed Mobility Assistance: pt rolled with verbal and visual cues; pt using RUE to push away from mattress to initiate sitting Transfers Transfers: Sit to Stand;Stand to Sit;Stand Pivot Transfers Sit to Stand: 3: Mod assist;With upper extremity assist;From bed Stand to Sit: 3: Mod assist;With upper extremity assist;To bed;To chair/3-in-1;With armrests Stand Pivot Transfers: 3: Mod assist Details for Transfer Assistance: stood x 2 from EOB; requires assist to LLE to prevent abduction and assist with hip and knee extension; pivoted with reaching to Rt far armrest and stepping RLE while  LLE supported Ambulation/Gait Ambulation/Gait Assistance: Not tested (comment) Modified Rankin (Stroke Patients Only) Pre-Morbid Rankin Score: No symptoms Modified Rankin: Severe disability    Exercises Other Exercises Other Exercises: Supine AAROM of LUE and LLE provided prior to OOB; noted incr shoulder elevation/shrug, shoulder extension, and elbow extension. No movement at wrist or hand noted. Improved hip flexion, adduction, abduction, and extension.   PT Diagnosis:    PT Problem List:   PT Treatment Interventions:     PT Goals Acute Rehab PT Goals Pt will go Supine/Side to Sit: with min assist PT Goal: Supine/Side to Sit - Progress: Progressing toward goal Pt will go Sit to Stand: with mod assist PT Goal: Sit to Stand - Progress: Partly met Pt will go Stand to Sit: with mod assist PT Goal: Stand to Sit - Progress: Partly met Pt will Transfer Bed to Chair/Chair to Bed: with max assist PT Transfer Goal: Bed to Chair/Chair to Bed - Progress: Progressing toward goal  Visit Information  Last PT Received On: 06/23/12 Assistance Needed: +1 (EOB, to chair; +2 helpful)    Subjective Data  Subjective: "feels different..." "hot then cold" Pt getting out several short phrases   Cognition  Overall Cognitive Status: Difficult to assess Difficult to assess due to: Impaired communication Arousal/Alertness: Awake/alert Behavior During Session: [facility] for tasks performed Cognition - Other Comments:  (less impulsive)    Balance  Static Sitting Balance Static Sitting - Balance Support: Bilateral upper extremity supported;Feet supported Static Sitting - Level of Assistance: 4: Min assist Dynamic Sitting Balance Dynamic Sitting - Balance Support: No upper extremity supported;Feet unsupported Dynamic Sitting - Level of Assistance: 3: Mod assist Reach (Patient is able  to reach ___ inches to right, left, forward, back): reaching overhead or down to floor pt moving quickly and surprised  herself when she completely lost her balance and required mod assist to recover; required assist x2 and then moving much more slowly and carefully performed the same tasks with minguard assist Static Standing Balance Static Standing - Balance Support: Right upper extremity supported Static Standing - Level of Assistance: 3: Mod assist Static Standing - Comment/# of Minutes: stood ~ 2 minutes; Lt knee flexes, yet with tactile and verbal cues, pt can extend knee and maintain for 1-2 seconds; stood x 2  End of Session PT - End of Session Activity Tolerance: Patient tolerated treatment well Patient left: in chair;with call bell/phone within reach;with family/visitor present Nurse Communication: Mobility status   GP     Margaret Hess 06/23/2012, 1:42 PM Pager [phone]

## 2012-06-23 NOTE — Progress Notes (Signed)
[facility] PROGRESS NOTE  Margaret Hess ZOX:[id number] DOB: 25-Nov-1965 DOA: 06/19/2012 PCP: Cala Bradford, MD  Assessment/Plan: Principal Problem:  *Stroke, embolic with hemorrhagic change From ventricular thrombus- appreciate neuro eval- cont coumadin per Neuro. CIR eval requested.   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    HTN (hypertension) Cont Lisinopril   Diabetes mellitus Cont sliding scale. Metformin and Victoza on hold.   Code Status: full code Disposition Plan: cont to follow in SDU DVT prophylaxis: on Coumadin  Brief narrative: Margaret Hess is a 46 year-old woman who was found on pervious admission to have an EF 17% and known LV thrombus who was discharged on 06/19/12 with Lovenox and Coumadin. She presented on 06/20/12 with stroke like symptoms including difficulty speaking and left sided weakness. EMS was called, and a code stroke was activated. Her symptoms initially resolved, but they did return, and she was admitted to the Neuro ICU for further evaluation. She was found to have a right MCA territory ischemic infarct with hemorrhagic conversion and resultant mass effect. She required pressors to keep SBP >120-140. She was transferred from the ICU team to [facility] on 10/22.   Consultants:  Neurology  Heart failure team  Procedures:  none  Antibiotics:  none  HPI/Subjective: Pt alert and sitting up in bed. Has no complaints.   Objective: Filed Vitals:   06/23/12 0500 06/23/12 0800 06/23/12 1200 06/23/12 1600  BP:  118/87 123/83 137/96  Pulse:  99 107 96  Temp:      TempSrc:      Resp:  19 18 17   Height:      Weight: 75.9 kg (167 lb 5.3 oz)     SpO2:  99% 95% 100%    Intake/Output Summary (Last 24 hours)  at 06/23/12 1804 Last data filed at 06/23/12 0800  Gross per 24 hour  Intake    410 ml  Output      0 ml  Net    410 ml    Exam:   General:  Alert, no distress, has short answers to most questions.   Cardiovascular: RRR, no murmurs  Respiratory: CTA b/l   Abdomen: soft, NT, ND, BS+  Ext:no c/c/e  Neuro: left arm and leg 0/5, left facial droop and expressive aphasi  Data Reviewed: Basic Metabolic Panel:  Lab 06/23/12 8119 06/22/12 0332 06/21/12 0434 06/20/12 0619 06/19/12 2333 06/19/12 2332  NA 135 130* 136 132* 134* --  K 4.0 4.2 4.2 4.3 4.7 --  CL 97 97 97 93* 100 --  CO2 25 21 24 25  -- 22  GLUCOSE 172* 144* 210* 269* 267* --  BUN 15 18 11 12 14  --  CREATININE 0.64 0.56 0.37* 0.32* 0.80 --  CALCIUM 9.1 8.9 9.0 9.6 -- 9.5  MG 2.1 -- -- 1.8 -- --  PHOS 3.0 -- -- 3.7 -- --   Liver Function Tests:  Lab 06/19/12 2332 06/17/12 0455  AST 61* 149*  ALT 184* 348*  ALKPHOS 55 51  BILITOT 0.3 0.5  PROT 6.5 5.5*  ALBUMIN 2.5* 2.2*   No results found for this basename: LIPASE:5,AMYLASE:5 in the last 168 hours No results found for this basename: AMMONIA:5 in the last 168 hours CBC:  Lab 06/23/12 0500 06/22/12 0332 06/21/12 0434 06/20/12 0619 06/19/12 2333 06/19/12 2332  WBC  13.8* 13.2* 13.6* 12.6* -- 10.1  NEUTROABS -- -- -- -- -- 5.4  HGB 15.2* 13.9 15.0 15.3* 13.6 --  HCT 45.0 39.7 41.7 43.5 40.0 --  MCV 89.1 86.3 84.4 85.6 -- 87.0  PLT 325 217 255 235 -- 217   Cardiac Enzymes:  Lab 06/19/12 2333  CKTOTAL --  CKMB --  CKMBINDEX --  TROPONINI <0.30   BNP (last 3 results)  Basename 06/17/12 0455 06/15/12 1720  PROBNP 1930.0* 6932.0*   CBG:  Lab 06/22/12 2205 06/22/12 1558 06/22/12 0755 06/21/12 2143 06/21/12 1614  GLUCAP 142* 190* 132* 108* 140*    Recent Results (from the past 240 hour(s))  MRSA PCR SCREENING     Status: Normal   Collection Time   06/20/12  3:02 AM      Component Value Range Status Comment   MRSA by PCR NEGATIVE  NEGATIVE Final       Studies: Ct Head Wo Contrast  06/23/2012  *RADIOLOGY REPORT*  Clinical Data: Stroke  CT HEAD WITHOUT CONTRAST  Technique:  Contiguous axial images were obtained from the base of the skull through the vertex without contrast.  Comparison: MRI 06/21/2012  Findings: Acute infarct in the right MCA territory again noted. This involves the   right insular cortex and approximately half of the right parietal cortex.  There is a hemorrhagic component in the right putamen.  The area of hemorrhage is unchanged measuring approximately 32 x 11 mm extending into the right caudate.  There is mass effect and swelling of the infarct with mild midline shift measuring approximately 4 mm.  There is increasing mass effect on the right lateral ventricle compared to the prior study.  No new areas of infarction or hemorrhage are identified.  No underlying mass lesion is seen.  IMPRESSION: Acute hemorrhagic infarction on the right  with  similar distribution to the prior MRI.  There is increased edema and mass effect compared with the recent MRI.   Original Report Authenticated By: Camelia Phenes, M.D.    Ct Head Wo Contrast  06/20/2012  *RADIOLOGY REPORT*  Clinical Data: Increased aphasia and worsening left-sided weakness.  CT HEAD WITHOUT CONTRAST  Technique:  Contiguous axial images were obtained from the base of the skull through the vertex without contrast.  Comparison: 06/19/2012 at 2329 hours  Findings: The ventricles and sulci are symmetrical without significant effacement, displacement, or dilatation. No mass effect or midline shift. No abnormal extra-axial fluid collections. The grey-white matter junction is distinct. Basal cisterns are not effaced. No acute intracranial hemorrhage. No depressed skull fractures.  No significant change since previous study.  IMPRESSION: No acute intracranial abnormalities.  Stable appearance since previous study.   Original Report Authenticated By: Marlon Pel, M.D.    Ct  Head Wo Contrast  06/19/2012  *RADIOLOGY REPORT*  Clinical Data: Code stroke, left-sided weakness, aphasia  CT HEAD WITHOUT CONTRAST  Technique:  Contiguous axial images were obtained from the base of the skull through the vertex without contrast.  Comparison: None.  Findings: No evidence of parenchymal hemorrhage or extra-axial fluid collection. No mass lesion, mass effect, or midline shift.  No CT evidence of acute infarction.  Cerebral volume is age appropriate.  No ventriculomegaly.  The visualized paranasal sinuses are essentially clear. The mastoid air cells are unopacified.  No evidence of calvarial fracture.  IMPRESSION: No evidence of acute intracranial abnormality.  These results were called by telephone on 06/19/2012 at 1143 hours to Dr. Fonnie Jarvis, who verbally acknowledged  these results.   Original Report Authenticated By: Charline Bills, M.D.    Ct Angio Chest Pe W/cm &/or Wo Cm  06/15/2012  *RADIOLOGY REPORT*  Clinical Data: Cardia.  Dyspnea.  Elevated D-dimer.  CT ANGIOGRAPHY CHEST  Technique:  Multidetector CT imaging of the chest using the standard protocol during bolus administration of intravenous contrast. Multiplanar reconstructed images including MIPs were obtained and reviewed to evaluate the vascular anatomy.  Contrast: 80mL OMNIPAQUE IOHEXOL 350 MG/ML SOLN  Comparison: None.  Findings: There are no pulmonary emboli.  There is slight cardiomegaly.  No pulmonary edema. The lungs are clear.  There are moderate bilateral pleural effusions.  No osseous abnormality.  No visible coronary artery calcifications.  No significant adenopathy.  IMPRESSION: Slight cardiomegaly with moderate bilateral pleural effusions.  No pulmonary emboli.   Original Report Authenticated By: Gwynn Burly, M.D.    Mr [facility] Wo Contrast  06/21/2012  *RADIOLOGY REPORT*  Clinical Data:  Left-sided weakness.  Stroke.  MRI HEAD WITHOUT CONTRAST MRA HEAD WITHOUT CONTRAST  Technique:  Multiplanar, multiecho pulse  sequences of the brain and surrounding structures were obtained without intravenous contrast. Angiographic images of the head were obtained using MRA technique without contrast.  Comparison:  CT 06/20/2012  MRI HEAD  Findings:  Acute right MCA infarct.  Acute infarct involves the posterior internal capsule on the right, right putamen, right caudate, and  there is involvement of much of the insular cortex with extension into the right parietal cortex.  Small amount of infarct in the right frontal lobe.  There is hemorrhage in the right temporal lobe and in the right putamen.  Mild mass effect and midline shift of approximately 3 mm.  No acute infarct on the left.  Ventricle size is normal.  Brainstem and cerebellum are normal.  IMPRESSION: Acute hemorrhagic infarct in the right middle cerebral artery territory.  There is mass effect and mild midline shift of approximately 3 mm.  MRA HEAD  Findings: Both vertebral arteries are patent to the basilar.  The basilar is patent.  Superior cerebellar and posterior cerebral arteries are patent bilaterally.  Internal carotid artery is patent bilaterally.  Mild stenosis or hypoplasia of the left A1 segment.  Both anterior cerebral arteries are primarily supplied from the right A1 segment.  The left middle cerebral artery is normal.  There is abrupt occlusion of the right M1 segment compatible with an embolic infarct.  IMPRESSION: Acute occlusion of the right M1 segment compatible with an embolus causing right MCA infarction.   Original Report Authenticated By: Camelia Phenes, M.D.    Mr Brain Wo Contrast  06/21/2012  *RADIOLOGY REPORT*  Clinical Data:  Left-sided weakness.  Stroke.  MRI HEAD WITHOUT CONTRAST MRA HEAD WITHOUT CONTRAST  Technique:  Multiplanar, multiecho pulse sequences of the brain and surrounding structures were obtained without intravenous contrast. Angiographic images of the head were obtained using MRA technique without contrast.  Comparison:  CT  06/20/2012  MRI HEAD  Findings:  Acute right MCA infarct.  Acute infarct involves the posterior internal capsule on the right, right putamen, right caudate, and  there is involvement of much of the insular cortex with extension into the right parietal cortex.  Small amount of infarct in the right frontal lobe.  There is hemorrhage in the right temporal lobe and in the right putamen.  Mild mass effect and midline shift of approximately 3 mm.  No acute infarct on the left.  Ventricle size is normal.  Brainstem  and cerebellum are normal.  IMPRESSION: Acute hemorrhagic infarct in the right middle cerebral artery territory.  There is mass effect and mild midline shift of approximately 3 mm.  MRA HEAD  Findings: Both vertebral arteries are patent to the basilar.  The basilar is patent.  Superior cerebellar and posterior cerebral arteries are patent bilaterally.  Internal carotid artery is patent bilaterally.  Mild stenosis or hypoplasia of the left A1 segment.  Both anterior cerebral arteries are primarily supplied from the right A1 segment.  The left middle cerebral artery is normal.  There is abrupt occlusion of the right M1 segment compatible with an embolic infarct.  IMPRESSION: Acute occlusion of the right M1 segment compatible with an embolus causing right MCA infarction.   Original Report Authenticated By: Camelia Phenes, M.D.    Dg Chest Port 1 View  06/21/2012  *RADIOLOGY REPORT*  Clinical Data: Respiratory distress  PORTABLE CHEST - 1 VIEW  Comparison: 06/20/2012  Findings: Heart size is normal.  No pleural effusion or edema.  The lung volumes are low.  No airspace opacity.  Right subclavian central venous catheter is noted with tip in the right atrium.  IMPRESSION:  1.  No acute cardiopulmonary abnormalities noted. 2.  Low lung volumes.   Original Report Authenticated By: Rosealee Albee, M.D.    Dg Chest Port 1 View  06/20/2012  *RADIOLOGY REPORT*  Clinical Data: Right-sided CV L.  PORTABLE CHEST - 1  VIEW  Comparison: CT chest 06/15/2012  Findings: Right central venous catheter with tip over the low SVC region, allowing for patient rotation.  No pneumothorax.  Elevation of right hemidiaphragm.  Borderline heart size with normal pulmonary vascularity.  No focal airspace consolidation in the lungs.  No blunting of costophrenic angles.  IMPRESSION: Right central venous catheter with tip in the low SVC region.  No pneumothorax.   Original Report Authenticated By: Marlon Pel, M.D.    Mr Card Morphology Wo/w Cm  06/17/2012  *RADIOLOGY REPORT*  Clinical Data: Cardiomyopathy, evaluate for myocarditis, infiltrative disease.  MR CARDIA MORPHOLOGY WITHOUT AND WITH CONTRAST  GE 1.5 T magnet with dedicated cardiac coil.  FIESTA sequences for function and morphology.  10 minutes after 25 mL Multihance contrast was injected, inversion recovery sequences were done to assess for myocardial delayed enhancement. EF was calculated at a dedicated workstation.  Contrast: 25mL MULTIHANCE GADOBENATE DIMEGLUMINE 529 MG/ML IV SOLN  Comparison: None.  Findings: Mildly dilated left ventricle with severe systolic dysfunction, EF 20%. Global hypokinesis.  There was prominent trabeculation predominantly along the anterior wall, with diastolic noncompacted/compacted ratio = 2.  There was a small thrombus along the apical anterior wall. There was mild to moderate left atrial enlargement.  Normal right ventricular size with mildly decreased systolic function.  Mild right atrial enlargement.  There was moderate tricuspid regurgitation and probably mild mitral regurgitation.  No aortic insufficiency or stenosis.  On delayed enhancement imaging, there was no myocardial delayed enhancement.  Measurements:  LV EDV 192 mL  LV SV 39 mL  LV EF 20.4%  IMPRESSION: 1. Mildly dilated LV with severe systolic dysfunction, EF 20%, with global hypokinesis.  2. Small LV thrombus along the apical anterior wall.  3. 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  However, other walls do not show as prominent trabeculation.  The noncompacted/compacted ratio in diastole along the anterior wall is of borderline significance (2).  4. Normal RV size with mildly decreased systolic function.  5. No myocardial  delayed enhancement.  Therefore, no definitive evidence for prior MI, myocarditis or infiltrative disease.   Original Report Authenticated By: [email or web address]     Scheduled Meds:   . digoxin  0.125 mg Oral Daily  . insulin aspart  0-15 Units Subcutaneous TID WC  . insulin aspart  0-5 Units Subcutaneous QHS  . lisinopril  2.5 mg Oral BID  . pantoprazole  40 mg Oral QHS  . sodium chloride  3 mL Intravenous Q12H  . warfarin  2.5 mg Oral ONCE-1800  . warfarin  2.5 mg Oral ONCE-1800  . Warfarin - Pharmacist Dosing Inpatient   Does not apply q1800  . DISCONTD: aspirin  325 mg Oral Daily  . DISCONTD: pantoprazole (PROTONIX) IV  40 mg Intravenous QHS   Continuous Infusions:   . sodium chloride Stopped (06/22/12 1100)  . DOPamine Stopped (06/21/12 1800)    ________________________________________________________________________  Time spent: 35 min    [facility]  [facility] Pager [phone] If 8PM-8AM, please contact night-coverage at [email or web address], password [facility] 06/23/2012, 6:04 PM  LOS: 4 days

## 2012-06-23 NOTE — Progress Notes (Signed)
Advanced Heart Failure Rounding Note   Subjective:    46 yo history of DM,  HTN, NICM,  EF <20%.  LV thrombus, and Grade 3 diastolic dysfunction.  Mod TR.   Discharged from [facility] 06/19/12 after being treated for acute systolic heart failure and LV thrombus. Discharged on loveneox and coumadin. HF medications.   Presented to ED 06/20/12 with difficulty speaking and L sided weakness. CT of head negative. 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  Able to say a couple of words." She is". Expressive aphasia and L sided hemiparesis. Can wiggle L toes but no other movement. Dopamine stopped 06/21/12.  SBP 120-140s  Denies orthopnea, PND. Weight down 6 pounds.     Objective:     Vital Signs:   Temp:  [97.9 F (36.6 C)-98.6 F (37 C)] 98.6 F (37 C) (10/22 0400) Pulse Rate:  [96-110] 96  (10/22 0400) Resp:  [15-19] 16  (10/22 0400) BP: (117-141)/(69-91) 125/75 mmHg (10/22 0400) SpO2:  [97 %-100 %] 100 % (10/22 0400) Weight:  [75.9 kg (167 lb 5.3 oz)] 75.9 kg (167 lb 5.3 oz) (10/22 0500) Last BM Date: 06/19/12  Weight change: Filed Weights   06/20/12 0307 06/21/12 0455 06/23/12 0500  Weight: 78.5 kg (173 lb 1 oz) 78.7 kg (173 lb 8 oz) 75.9 kg (167 lb 5.3 oz)    Intake/Output:   Intake/Output Summary (Last 24 hours) at 06/23/12 0747 Last data filed at 06/22/12 2212  Gross per 24 hour  Intake    870 ml  Output      0 ml  Net    870 ml     Physical Exam:            General:  Well appearing. No resp difficulty HEENT: normal Neck: supple. JVP flat.  Carotids 2+ bilat; no bruits. No lymphadenopathy or thryomegaly appreciated. Cor: PMI nondisplaced. Tachycardic No gallops, rubs, or murmurs. Lungs: clear Abdomen: soft, nontender, nondistended. No hepatosplenomegaly. No bruits or masses. Good bowel sounds. Extremities: no cyanosis, clubbing, rash, edema Neuro: alert & orientedx3, cranial nerves  grossly intact. moves all 4 extremities w/o difficulty. Affect pleasant     Telemetry: Sinus tach 100s  Labs: Basic Metabolic Panel:  Lab 06/22/12 1610 06/21/12 0434 06/20/12 0619 06/19/12 2333 06/19/12 2332 06/19/12 0610  NA 130* 136 132* 134* 133* --  K 4.2 4.2 4.3 4.7 4.7 --  CL 97 97 93* 100 98 --  CO2 21 24 25  -- 22 26  GLUCOSE 144* 210* 269* 267* 268* --  BUN 18 11 12 14 15  --  CREATININE 0.56 0.37* 0.32* 0.80 0.59 --  CALCIUM 8.9 9.0 9.6 -- -- --  MG -- -- 1.8 -- -- --  PHOS -- -- 3.7 -- -- --    Liver Function Tests:  Lab 06/19/12 2332 06/17/12 0455  AST 61* 149*  ALT 184* 348*  ALKPHOS 55 51  BILITOT 0.3 0.5  PROT 6.5 5.5*  ALBUMIN 2.5* 2.2*   No results found for this basename: LIPASE:5,AMYLASE:5 in the last 168 hours No results found for this basename: AMMONIA:3 in the last 168 hours  CBC:  Lab 06/23/12 0500 06/22/12 0332 06/21/12 0434 06/20/12 0619 06/19/12 2333 06/19/12 2332  WBC 13.8* 13.2* 13.6* 12.6* -- 10.1  NEUTROABS -- -- -- -- -- 5.4  HGB 15.2* 13.9 15.0 15.3* 13.6 --  HCT 45.0 39.7 41.7 43.5 40.0 --  MCV 89.1 86.3 84.4  85.6 -- 87.0  PLT 325 217 255 235 -- 217    Cardiac Enzymes:  Lab 06/19/12 2333 06/16/12 1034  CKTOTAL -- --  CKMB -- --  CKMBINDEX -- --  TROPONINI <0.30 <0.30    BNP: BNP (last 3 results)  Basename 06/17/12 0455 06/15/12 1720  PROBNP 1930.0* 6932.0*       Imaging: Mr Maxine Glenn Head Wo Contrast  06/21/2012  *RADIOLOGY REPORT*  Clinical Data:  Left-sided weakness.  Stroke.  MRI HEAD WITHOUT CONTRAST MRA HEAD WITHOUT CONTRAST  Technique:  Multiplanar, multiecho pulse sequences of the brain and surrounding structures were obtained without intravenous contrast. Angiographic images of the head were obtained using MRA technique without contrast.  Comparison:  CT 06/20/2012  MRI HEAD  Findings:  Acute right MCA infarct.  Acute infarct involves the posterior internal capsule on the right, right putamen, right caudate, and   there is involvement of much of the insular cortex with extension into the right parietal cortex.  Small amount of infarct in the right frontal lobe.  There is hemorrhage in the right temporal lobe and in the right putamen.  Mild mass effect and midline shift of approximately 3 mm.  No acute infarct on the left.  Ventricle size is normal.  Brainstem and cerebellum are normal.  IMPRESSION: Acute hemorrhagic infarct in the right middle cerebral artery territory.  There is mass effect and mild midline shift of approximately 3 mm.  MRA HEAD  Findings: Both vertebral arteries are patent to the basilar.  The basilar is patent.  Superior cerebellar and posterior cerebral arteries are patent bilaterally.  Internal carotid artery is patent bilaterally.  Mild stenosis or hypoplasia of the left A1 segment.  Both anterior cerebral arteries are primarily supplied from the right A1 segment.  The left middle cerebral artery is normal.  There is abrupt occlusion of the right M1 segment compatible with an embolic infarct.  IMPRESSION: Acute occlusion of the right M1 segment compatible with an embolus causing right MCA infarction.   Original Report Authenticated By: Camelia Phenes, M.D.    Mr Brain Wo Contrast  06/21/2012  *RADIOLOGY REPORT*  Clinical Data:  Left-sided weakness.  Stroke.  MRI HEAD WITHOUT CONTRAST MRA HEAD WITHOUT CONTRAST  Technique:  Multiplanar, multiecho pulse sequences of the brain and surrounding structures were obtained without intravenous contrast. Angiographic images of the head were obtained using MRA technique without contrast.  Comparison:  CT 06/20/2012  MRI HEAD  Findings:  Acute right MCA infarct.  Acute infarct involves the posterior internal capsule on the right, right putamen, right caudate, and  there is involvement of much of the insular cortex with extension into the right parietal cortex.  Small amount of infarct in the right frontal lobe.  There is hemorrhage in the right temporal lobe and  in the right putamen.  Mild mass effect and midline shift of approximately 3 mm.  No acute infarct on the left.  Ventricle size is normal.  Brainstem and cerebellum are normal.  IMPRESSION: Acute hemorrhagic infarct in the right middle cerebral artery territory.  There is mass effect and mild midline shift of approximately 3 mm.  MRA HEAD  Findings: Both vertebral arteries are patent to the basilar.  The basilar is patent.  Superior cerebellar and posterior cerebral arteries are patent bilaterally.  Internal carotid artery is patent bilaterally.  Mild stenosis or hypoplasia of the left A1 segment.  Both anterior cerebral arteries are primarily supplied from the right A1 segment.  The left middle cerebral artery is normal.  There is abrupt occlusion of the right M1 segment compatible with an embolic infarct.  IMPRESSION: Acute occlusion of the right M1 segment compatible with an embolus causing right MCA infarction.   Original Report Authenticated By: Camelia Phenes, M.D.      Medications:     Scheduled Medications:    . aspirin  325 mg Oral Daily  . digoxin  0.125 mg Oral Daily  . insulin aspart  0-15 Units Subcutaneous TID WC  . insulin aspart  0-5 Units Subcutaneous QHS  . pantoprazole (PROTONIX) IV  40 mg Intravenous QHS  . sodium chloride  3 mL Intravenous Q12H  . warfarin  2.5 mg Oral ONCE-1800  . Warfarin - Pharmacist Dosing Inpatient   Does not apply q1800  . DISCONTD: warfarin  5 mg Oral q1800  . DISCONTD: Warfarin - Physician Dosing Inpatient   Does not apply q1800    Infusions:    . sodium chloride Stopped (06/22/12 1100)  . DOPamine Stopped (06/21/12 1800)    PRN Medications: sodium chloride, sodium chloride, acetaminophen, albuterol, sodium chloride   Assessment:  1. CVA, acute hemorrhagic R MCA    --c/b expressive aphasia and L-sided hemiparesis 2. 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 NICM, EF <20% 4.  LV thrombus  5. DM   6.  Hypertension  Plan/Discussion:   Slowly improving.  Discussed with Dr Pearlean Brownie Rip [place] to restart HF medications. Able to swallow. Start lisinopril 2.5 mg po bid. Check BMET tomorrow. Anticipate starting carvedilol 3.125 mg bid tomorrow.   Stop aspirin. Coumadin management per Neuro. Pharmacy to dose.   7:47 AM  Patient seen and examined with Tonye Becket, NP. We discussed all aspects of the encounter. I agree with the assessment and plan as stated above.   She seems to be improving slowly. HF is stable. Restart HF meds carefully. Will continue to follow. Appreciate the Stroke team's care.   Daniel Bensimhon,MD 2:18 PM

## 2012-06-24 ENCOUNTER — Ambulatory Visit (HOSPITAL_COMMUNITY): Payer: BC Managed Care – PPO

## 2012-06-24 DIAGNOSIS — I69921 Dysphasia following unspecified cerebrovascular disease: Secondary | ICD-10-CM

## 2012-06-24 DIAGNOSIS — F411 Generalized anxiety disorder: Secondary | ICD-10-CM

## 2012-06-24 DIAGNOSIS — G8100 Flaccid hemiplegia affecting unspecified side: Secondary | ICD-10-CM

## 2012-06-24 DIAGNOSIS — E119 Type 2 diabetes mellitus without complications: Secondary | ICD-10-CM

## 2012-06-24 LAB — BASIC METABOLIC PANEL
Chloride: 99 mEq/L (ref 96–112)
GFR calc Af Amer: >90 mL/min (ref >90)
GFR calc non Af Amer: >90 mL/min (ref >90)
Potassium: 4.1 mEq/L (ref 3.5–5.1)
Sodium: 134 mEq/L — ABNORMAL LOW (ref 135–145)

## 2012-06-24 LAB — GLUCOSE, CAPILLARY
Glucose-Capillary: 269 mg/dL — ABNORMAL HIGH (ref 70–99)
Glucose-Capillary: 221 mg/dL — ABNORMAL HIGH (ref 70–99)
Glucose-Capillary: 150 mg/dL — ABNORMAL HIGH (ref 70–99)

## 2012-06-24 LAB — PROTIME-INR: INR: 2.21 — ABNORMAL HIGH (ref 0.00–1.49)

## 2012-06-24 MED ORDER — WARFARIN SODIUM 5 MG PO TABS
5.0000 mg | ORAL_TABLET | Freq: Once | ORAL | Status: DC
  Administered 2012-06-24: 5 mg via ORAL
  Filled 2012-06-24 (×2): qty 1

## 2012-06-24 MED ORDER — CARVEDILOL 3.125 MG PO TABS
3.1250 mg | ORAL_TABLET | Freq: Two times a day (BID) | ORAL | Status: DC
  Administered 2012-06-24 – 2012-06-25 (×2): 3.125 mg via ORAL
  Filled 2012-06-24 (×4): qty 1

## 2012-06-24 NOTE — Progress Notes (Signed)
[facility] PROGRESS NOTE  Margaret Hess ZOX:[id number] DOB: Mar 16, 1966 DOA: 06/19/2012 PCP: Cala Bradford, MD  Assessment/Plan: Principal Problem:  *Stroke, embolic with hemorrhagic change From ventricular thrombus- appreciate neuro eval- cont coumadin per Neuro. CIR eval requested.    Active Problems:   LV (left ventricular) mural thrombus  Cont Coumadin- INR therapeutic   Nonischemic cardiomyopathy/ systolic and diastolic dysfunction EF less than 20 %, grade 3 diastolic dysfunction- Dr Gala Romney following and managing medications.  Continue digoxin and lisinopril, to restart coreg   HTN (hypertension) Cont Lisinopril   Diabetes mellitus Cont sliding scale. Metformin and Victoza on hold.   Code Status: full code Disposition Plan: cont to follow in SDU DVT prophylaxis: on Coumadin  Brief narrative: Margaret Hess is a 46 year-old woman who was found on pervious admission to have an EF 17% and known LV thrombus who was discharged on 06/19/12 with Lovenox and Coumadin. She presented on 06/20/12 with stroke like symptoms including difficulty speaking and left sided weakness. EMS was called, and a code stroke was activated. Her symptoms initially resolved, but they did return, and she was admitted to the Neuro ICU for further evaluation. She was found to have a right MCA territory ischemic infarct with hemorrhagic conversion and resultant mass effect. She required pressors to keep SBP >120-140. She was transferred from the ICU team to [facility] on 10/22.   Consultants:  Neurology  Heart failure team  Procedures:  none  Antibiotics:  none  HPI/Subjective: Pt alert and sitting up in bed. Has no complaints.   Objective: Filed Vitals:   06/23/12 2151 06/24/12 0157 06/24/12 0500 06/24/12 0612  BP: 107/59 123/87  132/74  Pulse: 103 104  102  Temp: 98.2 F (36.8 C) 98.2 F (36.8 C)  98.2 F (36.8 C)  TempSrc: Oral Oral  Oral  Resp: 18 17  18   Height:       Weight:   75.9 kg (167 lb 5.3 oz)   SpO2: 100% 100%  100%    Intake/Output Summary (Last 24 hours) at 06/24/12 0827 Last data filed at 06/24/12 0810  Gross per 24 hour  Intake    120 ml  Output      0 ml  Net    120 ml    Exam:   General:  Alert, no distress, has short answers to most questions.   Cardiovascular: RRR, no murmurs  Respiratory: CTA b/l   Abdomen: soft, NT, ND, BS+  Ext:no c/c/e  Neuro: left arm and leg 0/5, left facial droop and expressive aphasi  Data Reviewed: Basic Metabolic Panel:  Lab 06/23/12 8119 06/22/12 0332 06/21/12 0434 06/20/12 0619 06/19/12 2333 06/19/12 2332  NA 135 130* 136 132* 134* --  K 4.0 4.2 4.2 4.3 4.7 --  CL 97 97 97 93* 100 --  CO2 25 21 24 25  -- 22  GLUCOSE 172* 144* 210* 269* 267* --  BUN 15 18 11 12 14  --  CREATININE 0.64 0.56 0.37* 0.32* 0.80 --  CALCIUM 9.1 8.9 9.0 9.6 -- 9.5  MG 2.1 -- -- 1.8 -- --  PHOS 3.0 -- -- 3.7 -- --   Liver Function Tests:  Lab 06/19/12 2332  AST 61*  ALT 184*  ALKPHOS 55  BILITOT 0.3  PROT 6.5  ALBUMIN 2.5*   No results found for this basename: LIPASE:5,AMYLASE:5 in the last 168 hours No results found for this basename: AMMONIA:5 in the last 168 hours CBC:  Lab 06/23/12 0500 06/22/12 0332 06/21/12 0434  06/20/12 0619 06/19/12 2333 06/19/12 2332  WBC 13.8* 13.2* 13.6* 12.6* -- 10.1  NEUTROABS -- -- -- -- -- 5.4  HGB 15.2* 13.9 15.0 15.3* 13.6 --  HCT 45.0 39.7 41.7 43.5 40.0 --  MCV 89.1 86.3 84.4 85.6 -- 87.0  PLT 325 217 255 235 -- 217   Cardiac Enzymes:  Lab 06/19/12 2333  CKTOTAL --  CKMB --  CKMBINDEX --  TROPONINI <0.30   BNP (last 3 results)  Basename 06/17/12 0455 06/15/12 1720  PROBNP 1930.0* 6932.0*   CBG:  Lab 06/23/12 2220 06/23/12 1648 06/22/12 2205 06/22/12 1558 06/22/12 0755  GLUCAP 185* 221* 142* 190* 132*    Recent Results (from the past 240 hour(s))  MRSA PCR SCREENING     Status: Normal   Collection Time   06/20/12  3:02 AM      Component  Value Range Status Comment   MRSA by PCR NEGATIVE  NEGATIVE Final      Studies: Ct Head Wo Contrast  06/23/2012  *RADIOLOGY REPORT*  Clinical Data: Stroke  CT HEAD WITHOUT CONTRAST  Technique:  Contiguous axial images were obtained from the base of the skull through the vertex without contrast.  Comparison: MRI 06/21/2012  Findings: Acute infarct in the right MCA territory again noted. This involves the   right insular cortex and approximately half of the right parietal cortex.  There is a hemorrhagic component in the right putamen.  The area of hemorrhage is unchanged measuring approximately 32 x 11 mm extending into the right caudate.  There is mass effect and swelling of the infarct with mild midline shift measuring approximately 4 mm.  There is increasing mass effect on the right lateral ventricle compared to the prior study.  No new areas of infarction or hemorrhage are identified.  No underlying mass lesion is seen.  IMPRESSION: Acute hemorrhagic infarction on the right  with  similar distribution to the prior MRI.  There is increased edema and mass effect compared with the recent MRI.   Original Report Authenticated By: Camelia Phenes, M.D.    Ct Head Wo Contrast  06/20/2012  *RADIOLOGY REPORT*  Clinical Data: Increased aphasia and worsening left-sided weakness.  CT HEAD WITHOUT CONTRAST  Technique:  Contiguous axial images were obtained from the base of the skull through the vertex without contrast.  Comparison: 06/19/2012 at 2329 hours  Findings: The ventricles and sulci are symmetrical without significant effacement, displacement, or dilatation. No mass effect or midline shift. No abnormal extra-axial fluid collections. The grey-white matter junction is distinct. Basal cisterns are not effaced. No acute intracranial hemorrhage. No depressed skull fractures.  No significant change since previous study.  IMPRESSION: No acute intracranial abnormalities.  Stable appearance since previous study.    Original Report Authenticated By: Marlon Pel, M.D.    Ct Head Wo Contrast  06/19/2012  *RADIOLOGY REPORT*  Clinical Data: Code stroke, left-sided weakness, aphasia  CT HEAD WITHOUT CONTRAST  Technique:  Contiguous axial images were obtained from the base of the skull through the vertex without contrast.  Comparison: None.  Findings: No evidence of parenchymal hemorrhage or extra-axial fluid collection. No mass lesion, mass effect, or midline shift.  No CT evidence of acute infarction.  Cerebral volume is age appropriate.  No ventriculomegaly.  The visualized paranasal sinuses are essentially clear. The mastoid air cells are unopacified.  No evidence of calvarial fracture.  IMPRESSION: No evidence of acute intracranial abnormality.  These results were called by telephone on 06/19/2012 at  1143 hours to Dr. Fonnie Jarvis, who verbally acknowledged these results.   Original Report Authenticated By: Charline Bills, M.D.    Ct Angio Chest Pe W/cm &/or Wo Cm  06/15/2012  *RADIOLOGY REPORT*  Clinical Data: Cardia.  Dyspnea.  Elevated D-dimer.  CT ANGIOGRAPHY CHEST  Technique:  Multidetector CT imaging of the chest using the standard protocol during bolus administration of intravenous contrast. Multiplanar reconstructed images including MIPs were obtained and reviewed to evaluate the vascular anatomy.  Contrast: 80mL OMNIPAQUE IOHEXOL 350 MG/ML SOLN  Comparison: None.  Findings: There are no pulmonary emboli.  There is slight cardiomegaly.  No pulmonary edema. The lungs are clear.  There are moderate bilateral pleural effusions.  No osseous abnormality.  No visible coronary artery calcifications.  No significant adenopathy.  IMPRESSION: Slight cardiomegaly with moderate bilateral pleural effusions.  No pulmonary emboli.   Original Report Authenticated By: Gwynn Burly, M.D.    Mr [facility] Wo Contrast  06/21/2012  *RADIOLOGY REPORT*  Clinical Data:  Left-sided weakness.  Stroke.  MRI HEAD WITHOUT CONTRAST  MRA HEAD WITHOUT CONTRAST  Technique:  Multiplanar, multiecho pulse sequences of the brain and surrounding structures were obtained without intravenous contrast. Angiographic images of the head were obtained using MRA technique without contrast.  Comparison:  CT 06/20/2012  MRI HEAD  Findings:  Acute right MCA infarct.  Acute infarct involves the posterior internal capsule on the right, right putamen, right caudate, and  there is involvement of much of the insular cortex with extension into the right parietal cortex.  Small amount of infarct in the right frontal lobe.  There is hemorrhage in the right temporal lobe and in the right putamen.  Mild mass effect and midline shift of approximately 3 mm.  No acute infarct on the left.  Ventricle size is normal.  Brainstem and cerebellum are normal.  IMPRESSION: Acute hemorrhagic infarct in the right middle cerebral artery territory.  There is mass effect and mild midline shift of approximately 3 mm.  MRA HEAD  Findings: Both vertebral arteries are patent to the basilar.  The basilar is patent.  Superior cerebellar and posterior cerebral arteries are patent bilaterally.  Internal carotid artery is patent bilaterally.  Mild stenosis or hypoplasia of the left A1 segment.  Both anterior cerebral arteries are primarily supplied from the right A1 segment.  The left middle cerebral artery is normal.  There is abrupt occlusion of the right M1 segment compatible with an embolic infarct.  IMPRESSION: Acute occlusion of the right M1 segment compatible with an embolus causing right MCA infarction.   Original Report Authenticated By: Camelia Phenes, M.D.    Mr Brain Wo Contrast  06/21/2012  *RADIOLOGY REPORT*  Clinical Data:  Left-sided weakness.  Stroke.  MRI HEAD WITHOUT CONTRAST MRA HEAD WITHOUT CONTRAST  Technique:  Multiplanar, multiecho pulse sequences of the brain and surrounding structures were obtained without intravenous contrast. Angiographic images of the head were  obtained using MRA technique without contrast.  Comparison:  CT 06/20/2012  MRI HEAD  Findings:  Acute right MCA infarct.  Acute infarct involves the posterior internal capsule on the right, right putamen, right caudate, and  there is involvement of much of the insular cortex with extension into the right parietal cortex.  Small amount of infarct in the right frontal lobe.  There is hemorrhage in the right temporal lobe and in the right putamen.  Mild mass effect and midline shift of approximately 3 mm.  No acute infarct on the  left.  Ventricle size is normal.  Brainstem and cerebellum are normal.  IMPRESSION: Acute hemorrhagic infarct in the right middle cerebral artery territory.  There is mass effect and mild midline shift of approximately 3 mm.  MRA HEAD  Findings: Both vertebral arteries are patent to the basilar.  The basilar is patent.  Superior cerebellar and posterior cerebral arteries are patent bilaterally.  Internal carotid artery is patent bilaterally.  Mild stenosis or hypoplasia of the left A1 segment.  Both anterior cerebral arteries are primarily supplied from the right A1 segment.  The left middle cerebral artery is normal.  There is abrupt occlusion of the right M1 segment compatible with an embolic infarct.  IMPRESSION: Acute occlusion of the right M1 segment compatible with an embolus causing right MCA infarction.   Original Report Authenticated By: Camelia Phenes, M.D.    Dg Chest Port 1 View  06/21/2012  *RADIOLOGY REPORT*  Clinical Data: Respiratory distress  PORTABLE CHEST - 1 VIEW  Comparison: 06/20/2012  Findings: Heart size is normal.  No pleural effusion or edema.  The lung volumes are low.  No airspace opacity.  Right subclavian central venous catheter is noted with tip in the right atrium.  IMPRESSION:  1.  No acute cardiopulmonary abnormalities noted. 2.  Low lung volumes.   Original Report Authenticated By: Rosealee Albee, M.D.    Dg Chest Port 1 View  06/20/2012   *RADIOLOGY REPORT*  Clinical Data: Right-sided CV L.  PORTABLE CHEST - 1 VIEW  Comparison: CT chest 06/15/2012  Findings: Right central venous catheter with tip over the low SVC region, allowing for patient rotation.  No pneumothorax.  Elevation of right hemidiaphragm.  Borderline heart size with normal pulmonary vascularity.  No focal airspace consolidation in the lungs.  No blunting of costophrenic angles.  IMPRESSION: Right central venous catheter with tip in the low SVC region.  No pneumothorax.   Original Report Authenticated By: Marlon Pel, M.D.    Mr Card Morphology Wo/w Cm  06/17/2012  *RADIOLOGY REPORT*  Clinical Data: Cardiomyopathy, evaluate for myocarditis, infiltrative disease.  MR CARDIA MORPHOLOGY WITHOUT AND WITH CONTRAST  GE 1.5 T magnet with dedicated cardiac coil.  FIESTA sequences for function and morphology.  10 minutes after 25 mL Multihance contrast was injected, inversion recovery sequences were done to assess for myocardial delayed enhancement. EF was calculated at a dedicated workstation.  Contrast: 25mL MULTIHANCE GADOBENATE DIMEGLUMINE 529 MG/ML IV SOLN  Comparison: None.  Findings: Mildly dilated left ventricle with severe systolic dysfunction, EF 20%. Global hypokinesis.  There was prominent trabeculation predominantly along the anterior wall, with diastolic noncompacted/compacted ratio = 2.  There was a small thrombus along the apical anterior wall. There was mild to moderate left atrial enlargement.  Normal right ventricular size with mildly decreased systolic function.  Mild right atrial enlargement.  There was moderate tricuspid regurgitation and probably mild mitral regurgitation.  No aortic insufficiency or stenosis.  On delayed enhancement imaging, there was no myocardial delayed enhancement.  Measurements:  LV EDV 192 mL  LV SV 39 mL  LV EF 20.4%  IMPRESSION: 1. Mildly dilated LV with severe systolic dysfunction, EF 20%, with global hypokinesis.  2. Small LV thrombus  along the apical anterior wall.  3. 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  However, other walls do not show as prominent trabeculation.  The noncompacted/compacted ratio in diastole along the anterior wall is of borderline significance (2).  4. Normal RV size with  mildly decreased systolic function.  5. No myocardial delayed enhancement.  Therefore, no definitive evidence for prior MI, myocarditis or infiltrative disease.   Original Report Authenticated By: [email or web address]     Scheduled Meds:    . digoxin  0.125 mg Oral Daily  . insulin aspart  0-15 Units Subcutaneous TID WC  . insulin aspart  0-5 Units Subcutaneous QHS  . lisinopril  2.5 mg Oral BID  . pantoprazole  40 mg Oral QHS  . sodium chloride  3 mL Intravenous Q12H  . warfarin  2.5 mg Oral ONCE-1800  . Warfarin - Pharmacist Dosing Inpatient   Does not apply q1800  . DISCONTD: aspirin  325 mg Oral Daily  . DISCONTD: pantoprazole (PROTONIX) IV  40 mg Intravenous QHS   Continuous Infusions:    . sodium chloride Stopped (06/22/12 1100)  . DOPamine Stopped (06/21/12 1800)    ________________________________________________________________________  Time spent: 35 min    Metta Koranda  [facility] Pager [phone] If 8PM-8AM, please contact night-coverage at [email or web address], password [facility] 06/24/2012, 8:27 AM  LOS: 5 days

## 2012-06-24 NOTE — Progress Notes (Signed)
Occupational Therapy Treatment Patient Details Name: Margaret Hess MRN: [id number] DOB: Jan 28, 1966 Today's Date: 06/24/2012 Time: 4098-1191 OT Time Calculation (min): 26 min  OT Assessment / Plan / Recommendation Comments on Treatment Session Pt is motivated to increase her independence and mobility.  L UE hemiparesis remains dense.  Worked on pt protecting and positioning L UE, proximal stability, cognition, and ADL.      Follow Up Recommendations  Inpatient Rehab    Barriers to Discharge       Equipment Recommendations       Recommendations for Other Services    Frequency Min 3X/week   Plan Discharge plan remains appropriate    Precautions / Restrictions Precautions Precautions: Fall Precaution Comments: dense L hemiparesis Restrictions Weight Bearing Restrictions: No   Pertinent Vitals/Pain No pain    ADL  Grooming: Performed;Brushing hair;Set up Where Assessed - Grooming: Supported standing Equipment Used: Gait belt ADL Comments: Located ADL items across sink area as requested when only one item.  Decreased ability to follow multiple step commands standing at sink.    OT Diagnosis:    OT Problem List:   OT Treatment Interventions:     OT Goals ADL Goals Pt Will Perform Grooming: with min assist;Unsupported;Sitting, edge of bed ADL Goal: Grooming - Progress: Progressing toward goals Pt Will Transfer to Toilet: with mod assist;Stand pivot transfer;3-in-1 Miscellaneous OT Goals Miscellaneous OT Goal #1: Pt will attend to left arm before and after transfers with Mod cuing OT Goal: Miscellaneous Goal #1 - Progress: Progressing toward goals Miscellaneous OT Goal #2: Pt will be able to locate items in room/on table/on a page with min cuing to work on vision OT Goal: Miscellaneous Goal #2 - Progress: Progressing toward goals Miscellaneous OT Goal #3: Pt will be min A to come up to sit EOB to the left  Visit Information  Last OT Received On: 06/24/12 Assistance  Needed: +1 PT/OT Co-Evaluation/Treatment: Yes    Subjective Data      Prior Functioning       Cognition  Overall Cognitive Status: Impaired Area of Impairment: Attention;Safety/judgement;Following commands;Awareness of errors;Awareness of deficits Arousal/Alertness: Awake/alert Behavior During Session: [facility] for tasks performed Current Attention Level: Selective Following Commands: Follows one step commands consistently;Follows multi-step commands inconsistently Safety/Judgement: Decreased awareness of safety precautions;Impulsive Awareness of Errors: Assistance required to identify errors made    Mobility  Shoulder Instructions Bed Mobility Bed Mobility: Not assessed Transfers Transfers: Sit to Stand;Stand to Sit Sit to Stand: 3: Mod assist;With upper extremity assist;From chair/3-in-1 (pulling up on sink, L  knee blocked, L UE supported) Stand to Sit: 3: Mod assist;To bed;With upper extremity assist Details for Transfer Assistance: stood x 2 at sink       Exercises      Balance Balance Balance Assessed: Yes Dynamic Sitting Balance Dynamic Sitting - Balance Support: Feet supported;No upper extremity supported Dynamic Sitting - Level of Assistance: 2: Max assist Dynamic Sitting - Balance Activities: Forward lean/weight shifting;Lateral lean/weight shifting Static Standing Balance Static Standing - Balance Support: No upper extremity supported Static Standing - Level of Assistance: 1: +2 Total assist;Patient percentage (comment) (70) Static Standing - Comment/# of Minutes: x 2, 1-2 minutes each   End of Session OT - End of Session Activity Tolerance: Patient tolerated treatment well Patient left: in chair;with call bell/phone within reach;with nursing in room  GO     Evern Bio 06/24/2012, 12:50 PM [phone]

## 2012-06-24 NOTE — Progress Notes (Signed)
Stroke Team Progress Note  HISTORY Margaret Hess is an 46 y.o. female who was recently admitted to the medicine service with heart failure that was newly diagnosed on Monday 06/15/2012. She was found to have a left ventricular thrombus, and started on anticoagulation. She was discharged earlier today 06/20/2012 with a therapeutic INR, as well as prescription for Lovenox.   She administered Lovenox shot about an hour prior to symptoms starting. At 10:30 PM, she noticed onset of left-sided weakness as well as difficulty speaking. EMS was called and they called a code stroke. In transit, she was seen to have improvement, most notably in her left-sided weakness, and her speech seemed clear some. Patient was not a TPA candidate secondary to INR greater than 1.7. She was admitted to the neuro ICU for further evaluation and treatment.  SUBJECTIVE  She is trying to talk today and is getting more words out. Sitting up in bed feeding herself.await rehab decision.INR is 2.2 today   OBJECTIVE Most recent Vital Signs: Filed Vitals:   06/24/12 0157 06/24/12 0500 06/24/12 0612 06/24/12 1012  BP: 123/87  132/74 108/67  Pulse: 104  102 99  Temp: 98.2 F (36.8 C)  98.2 F (36.8 C) 97.8 F (36.6 C)  TempSrc: Oral  Oral Oral  Resp: 17  18 18   Height:      Weight:  75.9 kg (167 lb 5.3 oz)    SpO2: 100%  100% 100%   CBG (last 3)   Basename 06/24/12 0707 06/23/12 2220 06/23/12 1648  GLUCAP 154* 185* 221*    IV Fluid Intake:      . sodium chloride Stopped (06/22/12 1100)  . DOPamine Stopped (06/21/12 1800)    MEDICATIONS     . digoxin  0.125 mg Oral Daily  . insulin aspart  0-15 Units Subcutaneous TID WC  . insulin aspart  0-5 Units Subcutaneous QHS  . lisinopril  2.5 mg Oral BID  . pantoprazole  40 mg Oral QHS  . sodium chloride  3 mL Intravenous Q12H  . warfarin  2.5 mg Oral ONCE-1800  . Warfarin - Pharmacist Dosing Inpatient   Does not apply q1800   PRN:  sodium chloride, sodium  chloride, acetaminophen, albuterol, sodium chloride  Diet:  Dysphagia 3 thin liquids Activity:  Out of Bed  DVT Prophylaxis:  SCDs   CLINICALLY SIGNIFICANT STUDIES Basic Metabolic Panel:   Lab 06/24/12 0850 06/23/12 0500 06/20/12 0619  NA 134* 135 --  K 4.1 4.0 --  CL 99 97 --  CO2 23 25 --  GLUCOSE 313* 172* --  BUN 15 15 --  CREATININE 0.55 0.64 --  CALCIUM 8.8 9.1 --  MG -- 2.1 1.8  PHOS -- 3.0 3.7   Liver Function Tests:   Lab 06/19/12 2332  AST 61*  ALT 184*  ALKPHOS 55  BILITOT 0.3  PROT 6.5  ALBUMIN 2.5*   CBC:   Lab 06/23/12 0500 06/22/12 0332 06/19/12 2332  WBC 13.8* 13.2* --  NEUTROABS -- -- 5.4  HGB 15.2* 13.9 --  HCT 45.0 39.7 --  MCV 89.1 86.3 --  PLT 325 217 --   Coagulation:   Lab 06/24/12 0620 06/23/12 0500 06/22/12 0332 06/21/12 0434  LABPROT 23.6* 26.1* 26.6* 21.9*  INR 2.21* 2.54* 2.60* 2.00*   Cardiac Enzymes:   Lab 06/19/12 2333  CKTOTAL --  CKMB --  CKMBINDEX --  TROPONINI <0.30   Lipid Panel    Component Value Date/Time   CHOL 121 06/17/2012 0455  TRIG 169* 06/17/2012 0455   HDL 23* 06/17/2012 0455   CHOLHDL 5.3 06/17/2012 0455   VLDL 34 06/17/2012 0455   LDLCALC 64 06/17/2012 0455   HgbA1C  Lab Results  Component Value Date   HGBA1C 7.0* 06/17/2012    Urine Drug Screen:     Component Value Date/Time   LABOPIA NONE DETECTED 06/20/2012 0138   COCAINSCRNUR NONE DETECTED 06/20/2012 0138   LABBENZ NONE DETECTED 06/20/2012 0138   AMPHETMU NONE DETECTED 06/20/2012 0138   THCU NONE DETECTED 06/20/2012 0138   LABBARB NONE DETECTED 06/20/2012 0138    Alcohol Level: No results found for this basename: ETH:2 in the last 168 hours  CT of the brain   10/22/2013Acute hemorrhagic infarction on the right with similar distribution to the prior MRI 06/20/2012 No acute intracranial abnormalities. Stable appearance since previous study. 06/19/2012 No evidence of acute intracranial abnormality.  MRI of the brain  06/21/2012  Acute hemorrhagic infarct in the right middle cerebral artery territory.  There is mass effect and mild midline shift of approximately 3 mm.  MRA of the brain  06/21/2012  Acute occlusion of the right M1 segment compatible with an embolus causing right MCA infarction.   2D Echocardiogram  06/16/2012 EF < 20%.Severe global hypokinesis with minimal regional variation. There appears to be a non-mobile, calcified mass measuring 1.0 x 1.5 cm on the apical lateral wall most consistent with organized thrombus.  Carotid Doppler  No evidence of hemodynamically significant internal carotid artery stenosis. Vertebral artery flow is antegrade.   CXR  06/20/2012 Right central venous catheter with tip in the low SVC region. No pneumothorax. 06/21/2012   1.  No acute cardiopulmonary abnormalities noted. 2.  Low lung volumes.   EKG  sinus tachycardia.   Therapy Recommendations PT - CIR; OT - ; ST - CIR  Physical Exam  General: lying in bed quietly  CV: Slightly tachycardic, but regular  Mental Status:  She is awake,   following commands .hesistant and dysfluent speech and speaks only short sentences. Cranial Nerves:  III,IV, VI: Left visual field deficit  V,VII: She has a left lower facial droop,  VIII: hearing is intact to voice  X: Uvula elevates symmetrically  XI: Shoulder shrug decreased at left side.  XII: tongue is midline without atrophy or fasciculations.  Motor:  Dense flaccid left hemiparesis, left arm 0, left leg 0.  Sensory:  Responsive to pain at both side.  Deep Tendon Reflexes:  Hyporeflexia at left.  Plantars:  Toes are downgoing right, upgoing left.  Cerebellar:  FNF and HKS are intact bilaterally  Gait:  Deferred.  ASSESSMENT [name] is a 46 y.o. female presenting with left sided weakness and aphasia in setting of acute left mural thrombus on coumadin and lovenox, with last lovenox dose within 1 hr, INR 2.01 on arrival. Imaging confirms a right MCA infarct with  cerebral edema, mild midline shift and hemorrhagic transformation. Infarct felt to be embolic secondary to known LV mural thrombus.  Work up underway. On warfarin and and full dose lovenox prior to admission. Now on warfarin for secondary stroke prevention. 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 No indication for other bridging.)Patient is at high risk for hemorrhagic transformation into her stroke but DR in a very tricky situation and the need for ongoing continuation of anticoagulation in my opinion outweighs the risk of increasing hemorrhagic transformation with anticoagulation. Goal INR is  2.0-3.0. Stop aspirin with INR > 2.0. Will stop aspirin.  D/w with patient and family. Stroke team will sign off. Call for questions.F/U with me in 2 months.  Delia Heady, MD Medical Director [facility] Pager: [phone] 06/24/2012 10:51 AM .    06/24/2012 10:47 AM

## 2012-06-24 NOTE — Progress Notes (Signed)
Met with patient and her sister at bedside to discuss CIR. Both are agreeable and eager to come to CIR. Dr Pearlean Brownie reports pt ready for d/c today. Have faxed clinicals to [facility] and hoping to receive approval for admission today. Please call for questions: [phone].

## 2012-06-24 NOTE — Progress Notes (Signed)
ANTICOAGULATION CONSULT NOTE - Follow Up Consult  Pharmacy Consult for Coumadin Indication: LV thrombus  No Known Allergies  Patient Measurements: Height: 5\' 7"  (170.2 cm) Weight: 167 lb 5.3 oz (75.9 kg) IBW/kg (Calculated) : 61.6   Vital Signs: Temp: 98.2 F (36.8 C) (10/23 0612) Temp src: Oral (10/23 0612) BP: 132/74 mmHg (10/23 0612) Pulse Rate: 102  (10/23 0612)  Labs:  Basename 06/24/12 0620 06/23/12 0500 06/22/12 0332  HGB -- 15.2* 13.9  HCT -- 45.0 39.7  PLT -- 325 217  APTT -- -- --  LABPROT 23.6* 26.1* 26.6*  INR 2.21* 2.54* 2.60*  HEPARINUNFRC -- -- --  CREATININE -- 0.64 0.56  CKTOTAL -- -- --  CKMB -- -- --  TROPONINI -- -- --    Estimated Creatinine Clearance: 93.4 ml/min (by C-G formula based on Cr of 0.64).  Assessment: 45yof discharged from [facility] on 10/18 on coumadin 5mg  daily and lovenox 80mg  q12 for new LV thrombus. INR 2.15 at time of discharge after only 2 doses of coumadin. Returns to the hospital that same night with difficulty speaking and L sided weakness. 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   10/15: no INR, 7.5mg  10/16: INR 1.29, 7.5mg  10/17: INR 2.38, dose held 10/18: INR 2.15, 5mg  (taken at home) 10/19: no INR, 5mg  10/20: INR 2.00, 5mg  10/21: INR 2.6, 2.5mg  10/22 INR 2.54, 2.5mg  10/23 INR 2.21  Coumadin continued (ok by neuro) at home dose of 5mg  daily. INR is therapeutic today, trending down.  Goal of Therapy:  INR 2-3 Monitor platelets by anticoagulation protocol: Yes   Plan:  1) Coumadin 5mg  x 1 2) Follow up INR in AM   Drue Stager 06/24/2012,8:46 AM

## 2012-06-24 NOTE — PMR Pre-admission (Signed)
PMR Admission Coordinator Pre-Admission Assessment  Patient: Margaret Hess is an 46 y.o., female MRN: [id number] DOB: September 12, 1965 Height: 5\' 7"  (170.2 cm) Weight: 75.9 kg (167 lb 5.3 oz)  Insurance Information PPO:yes     PRIMARY: BCBS State      Policy#: [id number]      Subscriber: self CM Name: Marylu Lund      Phone#: [phone]     Fax#: [phone] Pre-Cert#: [id number]      Employer: [employer] Benefits:  Phone #: [phone]     Name: Edman Circle. Date: 03/02/12     Deduct: $350 (met)      Out of Pocket Max: [phone] met)    Life Max: unlimited CIR: $233 copay per admission then 80/20%      SNF: 100 days Outpatient: 80/20%     Co-Pay: $70 Home Health: 80/20%      Co-Pay:  DME: 80/20%     Co-Pay:  Providers: in network  Emergency Contact Information Contact Information    Name Relation Home Work Mobile   [place] Sister [phone]       Current Medical History  Patient Admitting Diagnosis: Embolic right MCA infarct causing aphasia, apraxia, Left flaccid hemi-paresis History of Present Illness: 46 y.o. right-handed female with history of migraine headaches, hypertension and diabetes mellitus with peripheral neuropathy and congestive heart failure. Patient initially presented 06/15/2012 with dyspnea found to have nonischemic cardiomyopathy with ejection fraction of 70%. An echocardiogram is completed showing severe global hypokinesis with normal left ventricular cavity size and a left ventricular apical thrombus. The patient had an MRI on 06/17/2012 which confirmed a small apical left ventricular thrombus. The patient was placed on Coumadin therapy and discharge from the hospital 06/19/2012 1 her INR was therapeutic. She was also given a prescription for 3 days of cross coverage with Lovenox 80 mg twice daily. On 06/19/2012 she developed acute onset of left-sided weakness and difficulty speaking. She was readmitted to the hospital 06/20/2012 with MRI of the brain showing acute  hemorrhagic infarct in the right middle cerebral artery territory. There was mass effect and mild midline shift of approximately 3 mm. MRA of the head showed acute occlusion of the right M1 segment compatible with embolus causing right MCA infarction. Carotid Dopplers completed 06/21/2012 showing no significant ICA stenosis. Patient did not receive TPA. Followup neurology service as well as cardiology services advised to continue Coumadin therapy with latest INR 2.54. Noted admission INR of 2.15. She is presently on a dysphagia 3 thin liquid diet.   Total: 18 =NIH   Past Medical History  Past Medical History  Diagnosis Date  . Migraines   . Hypertension   . Diabetes mellitus    Family History  family history is not on file.  Prior Rehab/Hospitalizations: none  Current Medications  Current facility-administered medications:0.9 %  sodium chloride infusion, , Intravenous, Continuous, Storm Frisk, MD;  0.9 %  sodium chloride infusion, 250 mL, Intravenous, PRN, Roxine Caddy, MD;  0.9 %  sodium chloride infusion, 250 mL, Intravenous, PRN, Roxine Caddy, MD;  acetaminophen (TYLENOL) tablet 650 mg, 650 mg, Oral, Q4H PRN, Oretha Milch, MD, 650 mg at 06/21/12 1751 albuterol (PROVENTIL HFA;VENTOLIN HFA) 108 (90 BASE) MCG/ACT inhaler 4 puff, 4 puff, Inhalation, Q2H PRN, Roxine Caddy, MD;  carvedilol (COREG) tablet 3.125 mg, 3.125 mg, Oral, BID WC, Amy D Clegg, NP;  digoxin (LANOXIN) tablet 0.125 mg, 0.125 mg, Oral, Daily, Cassell Clement, MD, 0.125 mg at 06/24/12 1158;  insulin aspart (novoLOG)  injection 0-15 Units, 0-15 Units, Subcutaneous, TID WC, Storm Frisk, MD, 5 Units at 06/24/12 1214 insulin aspart (novoLOG) injection 0-5 Units, 0-5 Units, Subcutaneous, QHS, Storm Frisk, MD, 2 Units at 06/20/12 2227;  lisinopril (PRINIVIL,ZESTRIL) tablet 2.5 mg, 2.5 mg, Oral, BID, Amy D Clegg, NP, 2.5 mg at 06/24/12 1157;  pantoprazole (PROTONIX) EC tablet 40 mg, 40 mg, Oral, QHS, Lonia Blood, MD,  40 mg at 06/23/12 2220;  sodium chloride 0.9 % injection 3 mL, 3 mL, Intravenous, Q12H, Roxine Caddy, MD, 3 mL at 06/23/12 2219 sodium chloride 0.9 % injection 3 mL, 3 mL, Intravenous, PRN, Roxine Caddy, MD;  warfarin (COUMADIN) tablet 2.5 mg, 2.5 mg, Oral, ONCE-1800, Lonia Blood, MD, 2.5 mg at 06/23/12 1755;  warfarin (COUMADIN) tablet 5 mg, 5 mg, Oral, ONCE-1800, Zannie Cove, MD;  Warfarin - Pharmacist Dosing Inpatient, , Does not apply, q1800, Fredrik Rigger, PHARMD DISCONTD: DOPamine (INTROPIN) 800 mg in dextrose 5 % 250 mL infusion, 2-20 mcg/kg/min, Intravenous, Titrated, Pricilla Riffle, MD, 2 mcg/kg/min at 06/21/12 1753  Patients Current Diet: Dysphagia II, Carb modified  Precautions / Restrictions Precautions Precautions: Fall Precaution Comments: dense L hemiparesis Restrictions Weight Bearing Restrictions: No   Prior Activity Level Community (5-7x/wk): Active daily. Worked f/t. 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[employer]) Comments: professor at [employer]  Current Functional Level Cognition  Arousal/Alertness: Awake/alert Overall Cognitive Status: Impaired Overall Cognitive Status: Impaired Difficult to assess due to: Impaired communication Current Attention Level: Selective Orientation Level: Oriented to person (aphasia) Following Commands: Follows one step commands consistently;Follows multi-step commands inconsistently Safety/Judgement: Decreased awareness of safety precautions;Impulsive Awareness of Errors: Assistance required to identify errors made Cognition - Other Comments:  (less impulsive) Attention: Focused Focused Attention: Impaired Focused Attention Impairment: Verbal basic;Functional  basic Memory: Appears intact Awareness: Appears intact Problem Solving: Impaired Problem Solving Impairment: Verbal basic;Functional basic;Functional complex Executive Function: Initiating Initiating: Impaired Initiating Impairment: Verbal basic;Functional basic Behaviors: Restless;Impulsive;Perseveration;Verbal agitation;Poor frustration tolerance Safety/Judgment: Impaired    Extremity Assessment (includes Sensation/Coordination)  RUE ROM/Strength/Tone: Within functional levels  RLE ROM/Strength/Tone: Within functional levels RLE Sensation: WFL - Light Touch    ADLs  Eating/Feeding: Simulated;Set up;Supervision/safety Where Assessed - Eating/Feeding: Chair Grooming: Performed;Brushing hair;Set up Where Assessed - Grooming: Supported standing Upper Body Bathing: Simulated;Minimal assistance (with Mod A for sitting balance) Where Assessed - Upper Body Bathing: Supported sitting Lower Body Bathing: Simulated;Moderate assistance (with total A +2 (60%) for sit to stand and standing) Where Assessed - Lower Body Bathing: Supported sit to stand Upper Body Dressing: Simulated;Maximal assistance (Mod A for balance) Where Assessed - Upper Body Dressing: Supported sitting Lower Body Dressing: Simulated;Maximal assistance (total A +2(60%) sit to stand and standing) Where Assessed - Lower Body Dressing: Supported sit to stand Toilet Transfer: Simulated;+2 Total assistance Toilet Transfer: Patient Percentage: 60% Statistician Method: Ambulance person:  (from bed to recliner going to pt's right side) Toileting - Clothing Manipulation and Hygiene: Simulated;Moderate assistance Where Assessed - Toileting Clothing Manipulation and Hygiene: Sit on 3-in-1 or toilet Equipment Used: Gait belt Transfers/Ambulation Related to ADLs: total A+2 (60%) sit to stand and stand to sit. ADL Comments: Located ADL items across sink area as requested when only one item.  Decreased ability  to follow multiple step commands standing at  sink.    Mobility  Bed Mobility: Not assessed Rolling Right: 2: Max assist Rolling Left: 5: Set up;With rail Left Sidelying to Sit: 1: +1 Total assist;HOB flat;With rails Left Sidelying to Sit: Patient Percentage: 10% Supine to Sit: 1: +2 Total assist;HOB elevated;With rails Supine to Sit: Patient Percentage: 20% Sitting - Scoot to Edge of Bed: 1: +2 Total assist Sitting - Scoot to Edge of Bed: Patient Percentage: 30% Sit to Supine: 1: +2 Total assist Sit to Supine: Patient Percentage: 10%    Transfers  Transfers: Sit to Stand;Stand to Sit;Stand Pivot Transfers Sit to Stand: 3: Mod assist;With upper extremity assist;From chair/3-in-1 Sit to Stand: Patient Percentage: 60% Stand to Sit: 3: Mod assist;To bed;With upper extremity assist Stand to Sit: Patient Percentage: 60% Stand Pivot Transfers: 3: Mod assist Stand Pivot Transfers: Patient Percentage: 60%    Ambulation / Gait / Stairs / Wheelchair Mobility  Ambulation/Gait Ambulation/Gait Assistance: Not tested (comment)    Posture / Balance Static Sitting Balance Static Sitting - Balance Support: Bilateral upper extremity supported;Feet supported Static Sitting - Level of Assistance: 4: Min assist Static Sitting - Comment/# of Minutes: incr assist initially and as pt attempted to scoot her pelvis to her Rt; once not sitting on the "hump" of bed frame, pt able to maintain static balance with minguard assist x 30 sec Dynamic Sitting Balance Dynamic Sitting - Balance Support: Feet supported;No upper extremity supported Dynamic Sitting - Level of Assistance: 2: Max assist Reach (Patient is able to reach ___ inches to right, left, forward, back): reaching overhead or down to floor pt moving quickly and surprised herself when she completely lost her balance and required mod assist to recover; required assist x2 and then moving much more slowly and carefully performed the same tasks with minguard  assist Dynamic Sitting - Balance Activities: Forward lean/weight shifting;Lateral lean/weight shifting Static Standing Balance Static Standing - Balance Support: No upper extremity supported Static Standing - Level of Assistance: 1: +2 Total assist;Patient percentage (comment) Static Standing - Comment/# of Minutes: x 2, 1-2 mins with A for positining and weight shifting     Previous Home Environment Living Arrangements: Alone Lives With: Alone Available Help at Discharge: Family;Available PRN/intermittently;Friend(s) Type of Home: Apartment Home Layout: One level Home Access: Level entry Bathroom Shower/Tub: Engineer, manufacturing systems: Standard Bathroom Accessibility: Yes How Accessible: Accessible via walker Home Care Services: No  Discharge Living Setting Plans for Discharge Living Setting: Patient's home. Pt lives alone. 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 Pt has a friend who will move in with her; he works but they will hire daytime caregiver if needed. Anticipated Caregiver's Contact Information: Sister:Lynn Atkins: [phone] Ability/Limitations of Caregiver: none Caregiver Availability: 24/7 Discharge Plan Discussed with Primary Caregiver: Yes Is Caregiver In Agreement with Plan?: Yes Does Caregiver/Family have Issues with Lodging/Transportation while Pt is in Rehab?: No  Goals/Additional Needs Patient/Family Goal for Rehab: PT.OT,ST: min A Expected length of stay: 3 weeks Cultural Considerations: none Dietary Needs: Dys-3, carb modified Equipment Needs: TBD Additional Information:  Pt/Family Agrees to  Admission and willing to participate: Yes Program Orientation Provided & Reviewed with Pt/Caregiver Including Roles  & Responsibilities: Yes  Patient Condition: This patient's condition remains as  documented in the Consult dated 06/24/12, in which the Rehabilitation Physician determined and documented that the patient's condition is appropriate for intensive rehabilitative care in an inpatient rehabilitation facility.  Preadmission Screen Completed By:  Meryl Dare, 06/25/2012 11:05 AM ______________________________________________________________________   Discussed status with Dr. Wynn Banker on 06/25/12 at 2:04 PM and received telephone approval for admission today.  Admission Coordinator:  Meryl Dare, time 06/25/12 / Date 11:05 AM PM.

## 2012-06-24 NOTE — Progress Notes (Signed)
I do not anticipate hearing from [facility] today regarding approval to CIR. Pt's nurse aware and will inform patient. Will f/u in AM. Please call for questions: [phone].

## 2012-06-24 NOTE — Progress Notes (Signed)
Advanced Heart Failure Rounding Note   Subjective:    46 yo history of DM,  HTN, NICM,  EF <20%.  LV thrombus, and Grade 3 diastolic dysfunction.  Mod TR.   Discharged from [facility] 06/19/12 after being treated for acute systolic heart failure and LV thrombus. Discharged on loveneox and coumadin. HF medications.   Presented to ED 06/20/12 with difficulty speaking and L sided weakness. CT of head negative. 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  Expressive aphasia and L sided hemiparesis.  Dopamine stopped 06/21/12.    Able to say more words. Weight stable. CIR pending.  Denies orthopnea/PND/dyspnea.     Objective:     Vital Signs:   Temp:  [97.8 F (36.6 C)-98.2 F (36.8 C)] 97.8 F (36.6 C) (10/23 1012) Pulse Rate:  [96-107] 99  (10/23 1012) Resp:  [17-20] 18  (10/23 1012) BP: (107-137)/(59-96) 108/67 mmHg (10/23 1012) SpO2:  [95 %-100 %] 100 % (10/23 1012) Weight:  [75.9 kg (167 lb 5.3 oz)] 75.9 kg (167 lb 5.3 oz) (10/23 0500) Last BM Date: 06/23/12  Weight change: Filed Weights   06/21/12 0455 06/23/12 0500 06/24/12 0500  Weight: 78.7 kg (173 lb 8 oz) 75.9 kg (167 lb 5.3 oz) 75.9 kg (167 lb 5.3 oz)    Intake/Output:   Intake/Output Summary (Last 24 hours) at 06/24/12 1150 Last data filed at 06/24/12 0810  Gross per 24 hour  Intake    120 ml  Output      0 ml  Net    120 ml     Physical Exam:            General:  Well appearing. No resp difficulty HEENT: normal Neck: supple. JVP flat.  Carotids 2+ bilat; no bruits. No lymphadenopathy or thryomegaly appreciated. Cor: PMI nondisplaced. Tachycardic No gallops, rubs, or murmurs. Lungs: clear Abdomen: soft, nontender, nondistended. No hepatosplenomegaly. No bruits or masses. Good bowel sounds. Extremities: no cyanosis, clubbing, rash, edema Neuro: alert & orientedx3, cranial nerves grossly intact. moves all 4 extremities w/o  difficulty. Affect pleasant     Telemetry: Sinus tach 100s  Labs: Basic Metabolic Panel:  Lab 06/24/12 9147 06/23/12 0500 06/22/12 0332 06/21/12 0434 06/20/12 0619  NA 134* 135 130* 136 132*  K 4.1 4.0 4.2 4.2 4.3  CL 99 97 97 97 93*  CO2 23 25 21 24 25   GLUCOSE 313* 172* 144* 210* 269*  BUN 15 15 18 11 12   CREATININE 0.55 0.64 0.56 0.37* 0.32*  CALCIUM 8.8 9.1 8.9 -- --  MG -- 2.1 -- -- 1.8  PHOS -- 3.0 -- -- 3.7    Liver Function Tests:  Lab 06/19/12 2332  AST 61*  ALT 184*  ALKPHOS 55  BILITOT 0.3  PROT 6.5  ALBUMIN 2.5*   No results found for this basename: LIPASE:5,AMYLASE:5 in the last 168 hours No results found for this basename: AMMONIA:3 in the last 168 hours  CBC:  Lab 06/23/12 0500 06/22/12 0332 06/21/12 0434 06/20/12 0619 06/19/12 2333 06/19/12 2332  WBC 13.8* 13.2* 13.6* 12.6* -- 10.1  NEUTROABS -- -- -- -- -- 5.4  HGB 15.2* 13.9 15.0 15.3* 13.6 --  HCT 45.0 39.7 41.7 43.5 40.0 --  MCV 89.1 86.3 84.4 85.6 -- 87.0  PLT 325 217 255 235 -- 217    Cardiac Enzymes:  Lab 06/19/12 2333  CKTOTAL --  CKMB --  CKMBINDEX --  TROPONINI <0.30  BNP: BNP (last 3 results)  Basename 06/17/12 0455 06/15/12 1720  PROBNP 1930.0* 6932.0*       Imaging: Ct Head Wo Contrast  06/23/2012  *RADIOLOGY REPORT*  Clinical Data: Stroke  CT HEAD WITHOUT CONTRAST  Technique:  Contiguous axial images were obtained from the base of the skull through the vertex without contrast.  Comparison: MRI 06/21/2012  Findings: Acute infarct in the right MCA territory again noted. This involves the   right insular cortex and approximately half of the right parietal cortex.  There is a hemorrhagic component in the right putamen.  The area of hemorrhage is unchanged measuring approximately 32 x 11 mm extending into the right caudate.  There is mass effect and swelling of the infarct with mild midline shift measuring approximately 4 mm.  There is increasing mass effect on the right  lateral ventricle compared to the prior study.  No new areas of infarction or hemorrhage are identified.  No underlying mass lesion is seen.  IMPRESSION: Acute hemorrhagic infarction on the right  with  similar distribution to the prior MRI.  There is increased edema and mass effect compared with the recent MRI.   Original Report Authenticated By: Camelia Phenes, M.D.      Medications:     Scheduled Medications:    . carvedilol  3.125 mg Oral BID WC  . digoxin  0.125 mg Oral Daily  . insulin aspart  0-15 Units Subcutaneous TID WC  . insulin aspart  0-5 Units Subcutaneous QHS  . lisinopril  2.5 mg Oral BID  . pantoprazole  40 mg Oral QHS  . sodium chloride  3 mL Intravenous Q12H  . warfarin  2.5 mg Oral ONCE-1800  . warfarin  5 mg Oral ONCE-1800  . Warfarin - Pharmacist Dosing Inpatient   Does not apply q1800    Infusions:    . sodium chloride Stopped (06/22/12 1100)  . DISCONTD: DOPamine Stopped (06/21/12 1800)    PRN Medications: sodium chloride, sodium chloride, acetaminophen, albuterol, sodium chloride   Assessment:  1. CVA, acute hemorrhagic R MCA    --c/b expressive aphasia and L-sided hemiparesis 2. 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 NICM, EF <20% 4.  LV thrombus  5. DM   6. Hypertension  Plan/Discussion:   Volume status stable. Continue lisinopril 2.5 mg bid. Will add carvedilol 3.125 mg bid. Renal function stable. Slowly titrate HF medications as she tolerates. We will continue to follow if she transfers to CIR.     Coumadin management per Neuro. Pharmacy to dose.   11:50 AM CLEGG,AMY]  Patient seen and examined with Tonye Becket, NP. We discussed all aspects of the encounter. I agree with the assessment and plan as stated above.   She appears to be making some progress from a Neuro point of view. Continue to titrate HF meds slowly. Agree with CIR.   We will follow.  Daniel Bensimhon,MD 12:28 PM

## 2012-06-24 NOTE — Progress Notes (Signed)
Physical Therapy Treatment Patient Details Name: Margaret Hess MRN: [id number] DOB: [birth year] Today's Date: 06/24/2012 Time: 4098-1191 PT Time Calculation (min): 29 min  PT Assessment / Plan / Recommendation Comments on Treatment Session  Patient extrememly motivated and eager to participate in therapy. Still unable to attempt ambulation but able to do some standing balance activities this session    Follow Up Recommendations  Post acute inpatient     Does the patient have the potential to tolerate intense rehabilitation  Yes, Recommend IP Rehab Screening  Barriers to Discharge        Equipment Recommendations       Recommendations for Other Services Rehab consult  Frequency Min 4X/week   Plan Discharge plan remains appropriate;Frequency remains appropriate    Precautions / Restrictions Precautions Precautions: Fall Precaution Comments: dense L hemiparesis Restrictions Weight Bearing Restrictions: No   Pertinent Vitals/Pain     Mobility  Bed Mobility Bed Mobility: Not assessed Transfers Transfers: Sit to Stand;Stand to Sit;Stand Pivot Transfers Sit to Stand: 3: Mod assist;With upper extremity assist;From chair/3-in-1 Stand to Sit: 3: Mod assist;To bed;With upper extremity assist Details for Transfer Assistance: stood x 2 at sink, Patient required A to initate stand and to ensure balance with blocking of LLE Ambulation/Gait Ambulation/Gait Assistance: Not tested (comment) Modified Rankin (Stroke Patients Only) Pre-Morbid Rankin Score: No symptoms Modified Rankin: Severe disability    Exercises     PT Diagnosis:    PT Problem List:   PT Treatment Interventions:     PT Goals Acute Rehab PT Goals PT Goal: Sit to Stand - Progress: Progressing toward goal PT Goal: Stand to Sit - Progress: Progressing toward goal PT Transfer Goal: Bed to Chair/Chair to Bed - Progress: Progressing toward goal  Visit Information  Last PT Received On: 06/24/12 Assistance  Needed: +1 PT/OT Co-Evaluation/Treatment: Yes    Subjective Data      Cognition  Overall Cognitive Status: Impaired Area of Impairment: Attention;Safety/judgement;Following commands;Awareness of errors;Awareness of deficits Arousal/Alertness: Awake/alert Behavior During Session: [facility] for tasks performed Current Attention Level: Selective Following Commands: Follows one step commands consistently;Follows multi-step commands inconsistently Safety/Judgement: Decreased awareness of safety precautions;Impulsive Awareness of Errors: Assistance required to identify errors made    Balance  Balance Balance Assessed: Yes Dynamic Sitting Balance Dynamic Sitting - Balance Support: Feet supported;No upper extremity supported Dynamic Sitting - Level of Assistance: 2: Max assist Dynamic Sitting - Balance Activities: Forward lean/weight shifting;Lateral lean/weight shifting Static Standing Balance Static Standing - Balance Support: No upper extremity supported Static Standing - Level of Assistance: 1: +2 Total assist;Patient percentage (comment) Static Standing - Comment/# of Minutes: x 2, 1-2 mins with A for positining and weight shifting  End of Session PT - End of Session Activity Tolerance: Patient tolerated treatment well Patient left: in chair;with call bell/phone within reach;with family/visitor present Nurse Communication: Mobility status   GP     Kilee, Hedding 06/24/2012, 1:04 PM 06/24/2012 Fredrich Birks PTA [phone] pager [phone] office

## 2012-06-25 ENCOUNTER — Encounter (HOSPITAL_COMMUNITY): Payer: Self-pay | Admitting: *Deleted

## 2012-06-25 ENCOUNTER — Inpatient Hospital Stay (HOSPITAL_COMMUNITY)
Admission: RE | Admit: 2012-06-25 | Discharge: 2012-07-22 | DRG: 462 | Disposition: A | Discharge status: Skilled Nursing Facility | Payer: BC Managed Care – PPO | Source: Ambulatory Visit | Attending: Physical Medicine & Rehabilitation | Admitting: Physical Medicine & Rehabilitation

## 2012-06-25 DIAGNOSIS — Z5189 Encounter for other specified aftercare: Principal | ICD-10-CM

## 2012-06-25 DIAGNOSIS — I429 Cardiomyopathy, unspecified: Secondary | ICD-10-CM

## 2012-06-25 DIAGNOSIS — I5022 Chronic systolic (congestive) heart failure: Secondary | ICD-10-CM

## 2012-06-25 DIAGNOSIS — I10 Essential (primary) hypertension: Secondary | ICD-10-CM

## 2012-06-25 DIAGNOSIS — G8100 Flaccid hemiplegia affecting unspecified side: Secondary | ICD-10-CM

## 2012-06-25 DIAGNOSIS — M62838 Other muscle spasm: Secondary | ICD-10-CM

## 2012-06-25 DIAGNOSIS — F4323 Adjustment disorder with mixed anxiety and depressed mood: Secondary | ICD-10-CM

## 2012-06-25 DIAGNOSIS — I5032 Chronic diastolic (congestive) heart failure: Secondary | ICD-10-CM

## 2012-06-25 DIAGNOSIS — R350 Frequency of micturition: Secondary | ICD-10-CM

## 2012-06-25 DIAGNOSIS — E119 Type 2 diabetes mellitus without complications: Secondary | ICD-10-CM

## 2012-06-25 DIAGNOSIS — Z7901 Long term (current) use of anticoagulants: Secondary | ICD-10-CM

## 2012-06-25 DIAGNOSIS — I6340 Cerebral infarction due to embolism of unspecified cerebral artery: Secondary | ICD-10-CM

## 2012-06-25 DIAGNOSIS — E1149 Type 2 diabetes mellitus with other diabetic neurological complication: Secondary | ICD-10-CM

## 2012-06-25 DIAGNOSIS — I69991 Dysphagia following unspecified cerebrovascular disease: Secondary | ICD-10-CM

## 2012-06-25 DIAGNOSIS — I5189 Other ill-defined heart diseases: Secondary | ICD-10-CM

## 2012-06-25 DIAGNOSIS — R209 Unspecified disturbances of skin sensation: Secondary | ICD-10-CM

## 2012-06-25 DIAGNOSIS — I639 Cerebral infarction, unspecified: Secondary | ICD-10-CM

## 2012-06-25 DIAGNOSIS — M549 Dorsalgia, unspecified: Secondary | ICD-10-CM

## 2012-06-25 DIAGNOSIS — I240 Acute coronary thrombosis not resulting in myocardial infarction: Secondary | ICD-10-CM

## 2012-06-25 DIAGNOSIS — G43909 Migraine, unspecified, not intractable, without status migrainosus: Secondary | ICD-10-CM

## 2012-06-25 DIAGNOSIS — G8110 Spastic hemiplegia affecting unspecified side: Secondary | ICD-10-CM

## 2012-06-25 DIAGNOSIS — E1142 Type 2 diabetes mellitus with diabetic polyneuropathy: Secondary | ICD-10-CM

## 2012-06-25 DIAGNOSIS — Z79899 Other long term (current) drug therapy: Secondary | ICD-10-CM

## 2012-06-25 DIAGNOSIS — I428 Other cardiomyopathies: Secondary | ICD-10-CM

## 2012-06-25 DIAGNOSIS — R4701 Aphasia: Secondary | ICD-10-CM

## 2012-06-25 DIAGNOSIS — I619 Nontraumatic intracerebral hemorrhage, unspecified: Secondary | ICD-10-CM

## 2012-06-25 DIAGNOSIS — I509 Heart failure, unspecified: Secondary | ICD-10-CM

## 2012-06-25 DIAGNOSIS — G936 Cerebral edema: Secondary | ICD-10-CM

## 2012-06-25 LAB — PROTIME-INR: INR: 1.66 — ABNORMAL HIGH (ref 0.00–1.49)

## 2012-06-25 LAB — BASIC METABOLIC PANEL
BUN: 17 mg/dL (ref 6–23)
CO2: 23 mEq/L (ref 19–32)
Calcium: 8.8 mg/dL (ref 8.4–10.5)
Chloride: 102 mEq/L (ref 96–112)
Creatinine, Ser: 0.57 mg/dL (ref 0.50–1.10)
Glucose, Bld: 167 mg/dL — ABNORMAL HIGH (ref 70–99)

## 2012-06-25 LAB — GLUCOSE, CAPILLARY: Glucose-Capillary: 168 mg/dL — ABNORMAL HIGH (ref 70–99)

## 2012-06-25 MED ORDER — WARFARIN SODIUM 5 MG PO TABS
5.0000 mg | ORAL_TABLET | Freq: Once | ORAL | Status: DC
  Filled 2012-06-25: qty 1

## 2012-06-25 MED ORDER — POLYETHYLENE GLYCOL 3350 17 G PO PACK
17.0000 g | PACK | Freq: Every day | ORAL | Status: DC | PRN
  Administered 2012-07-13 – 2012-07-16 (×2): 17 g via ORAL
  Filled 2012-06-25: qty 1

## 2012-06-25 MED ORDER — PANTOPRAZOLE SODIUM 40 MG PO TBEC
40.0000 mg | DELAYED_RELEASE_TABLET | Freq: Every day | ORAL | Status: DC
  Administered 2012-06-25 – 2012-07-21 (×27): 40 mg via ORAL
  Filled 2012-06-25 (×28): qty 1

## 2012-06-25 MED ORDER — SORBITOL 70 % SOLN
30.0000 mL | Freq: Every day | Status: DC | PRN
  Administered 2012-06-27 – 2012-07-18 (×2): 30 mL via ORAL
  Filled 2012-06-25: qty 30

## 2012-06-25 MED ORDER — WARFARIN SODIUM 5 MG PO TABS
5.0000 mg | ORAL_TABLET | Freq: Once | ORAL | Status: AC
  Administered 2012-06-25: 5 mg via ORAL
  Filled 2012-06-25: qty 1

## 2012-06-25 MED ORDER — ONDANSETRON HCL 4 MG/2ML IJ SOLN: 4.0000 mg | Freq: Four times a day (QID) | INTRAMUSCULAR | Status: DC | PRN

## 2012-06-25 MED ORDER — INSULIN ASPART 100 UNIT/ML ~~LOC~~ SOLN
0.0000 [IU] | Freq: Three times a day (TID) | SUBCUTANEOUS | Status: DC
  Administered 2012-06-25: 5 [IU] via SUBCUTANEOUS
  Administered 2012-06-26: 8 [IU] via SUBCUTANEOUS
  Administered 2012-06-26 – 2012-06-27 (×4): 3 [IU] via SUBCUTANEOUS
  Administered 2012-06-27: 5 [IU] via SUBCUTANEOUS
  Administered 2012-06-28: 3 [IU] via SUBCUTANEOUS
  Administered 2012-06-28 (×2): 5 [IU] via SUBCUTANEOUS
  Administered 2012-06-29 (×2): 3 [IU] via SUBCUTANEOUS
  Administered 2012-06-29: 5 [IU] via SUBCUTANEOUS
  Administered 2012-06-30: 2 [IU] via SUBCUTANEOUS
  Administered 2012-06-30 – 2012-07-02 (×6): 3 [IU] via SUBCUTANEOUS
  Administered 2012-07-02: 2 [IU] via SUBCUTANEOUS
  Administered 2012-07-03 (×3): 3 [IU] via SUBCUTANEOUS
  Administered 2012-07-04: 5 [IU] via SUBCUTANEOUS
  Administered 2012-07-05 (×3): 2 [IU] via SUBCUTANEOUS
  Administered 2012-07-06: 3 [IU] via SUBCUTANEOUS
  Administered 2012-07-06: 2 [IU] via SUBCUTANEOUS
  Administered 2012-07-06: 3 [IU] via SUBCUTANEOUS
  Administered 2012-07-07 – 2012-07-09 (×6): 2 [IU] via SUBCUTANEOUS
  Administered 2012-07-09: 5 [IU] via SUBCUTANEOUS
  Administered 2012-07-10: 2 [IU] via SUBCUTANEOUS
  Administered 2012-07-10: 3 [IU] via SUBCUTANEOUS
  Administered 2012-07-10: 2 [IU] via SUBCUTANEOUS
  Administered 2012-07-11 (×2): 3 [IU] via SUBCUTANEOUS
  Administered 2012-07-12 (×3): 2 [IU] via SUBCUTANEOUS
  Administered 2012-07-13 (×2): 3 [IU] via SUBCUTANEOUS
  Administered 2012-07-14 – 2012-07-15 (×2): 2 [IU] via SUBCUTANEOUS
  Administered 2012-07-15: 3 [IU] via SUBCUTANEOUS
  Administered 2012-07-15: 2 [IU] via SUBCUTANEOUS
  Administered 2012-07-16: 3 [IU] via SUBCUTANEOUS
  Administered 2012-07-16 (×2): 2 [IU] via SUBCUTANEOUS
  Administered 2012-07-17: 3 [IU] via SUBCUTANEOUS
  Administered 2012-07-17 – 2012-07-18 (×4): 2 [IU] via SUBCUTANEOUS
  Administered 2012-07-19: 3 [IU] via SUBCUTANEOUS
  Administered 2012-07-19 – 2012-07-21 (×5): 2 [IU] via SUBCUTANEOUS
  Administered 2012-07-21: 3 [IU] via SUBCUTANEOUS
  Administered 2012-07-22: 2 [IU] via SUBCUTANEOUS

## 2012-06-25 MED ORDER — CARVEDILOL 3.125 MG PO TABS
3.1250 mg | ORAL_TABLET | Freq: Two times a day (BID) | ORAL | Status: DC
  Administered 2012-06-25 – 2012-07-16 (×37): 3.125 mg via ORAL
  Filled 2012-06-25 (×45): qty 1

## 2012-06-25 MED ORDER — ONDANSETRON HCL 4 MG PO TABS: 4.0000 mg | ORAL_TABLET | Freq: Four times a day (QID) | ORAL | Status: DC | PRN

## 2012-06-25 MED ORDER — WARFARIN - PHARMACIST DOSING INPATIENT: Freq: Every day | Status: DC

## 2012-06-25 MED ORDER — GABAPENTIN 100 MG PO CAPS
100.0000 mg | ORAL_CAPSULE | Freq: Two times a day (BID) | ORAL | Status: DC
  Administered 2012-06-25 – 2012-06-28 (×7): 100 mg via ORAL
  Filled 2012-06-25 (×10): qty 1

## 2012-06-25 MED ORDER — ALBUTEROL SULFATE HFA 108 (90 BASE) MCG/ACT IN AERS: 4.0000 | INHALATION_SPRAY | RESPIRATORY_TRACT | Status: DC | PRN

## 2012-06-25 MED ORDER — LISINOPRIL 5 MG PO TABS
5.0000 mg | ORAL_TABLET | Freq: Two times a day (BID) | ORAL | Status: DC
  Administered 2012-06-25 – 2012-07-06 (×17): 5 mg via ORAL
  Filled 2012-06-25 (×24): qty 1

## 2012-06-25 MED ORDER — LISINOPRIL 5 MG PO TABS
5.0000 mg | ORAL_TABLET | Freq: Two times a day (BID) | ORAL | Status: DC
  Administered 2012-06-25: 5 mg via ORAL
  Filled 2012-06-25 (×2): qty 1

## 2012-06-25 MED ORDER — INFLUENZA VIRUS VACC SPLIT PF IM SUSP
0.5000 mL | INTRAMUSCULAR | Status: AC
  Filled 2012-06-25: qty 0.5

## 2012-06-25 MED ORDER — ACETAMINOPHEN 325 MG PO TABS
325.0000 mg | ORAL_TABLET | ORAL | Status: DC | PRN
  Administered 2012-06-27 – 2012-07-21 (×27): 650 mg via ORAL
  Filled 2012-06-25 (×3): qty 2
  Filled 2012-06-25 (×2): qty 1
  Filled 2012-06-25 (×25): qty 2

## 2012-06-25 MED ORDER — DIGOXIN 125 MCG PO TABS
0.1250 mg | ORAL_TABLET | Freq: Every day | ORAL | Status: DC
  Administered 2012-06-26 – 2012-07-22 (×27): 0.125 mg via ORAL
  Filled 2012-06-25 (×29): qty 1

## 2012-06-25 MED ORDER — WARFARIN - PHARMACIST DOSING INPATIENT
Freq: Every day | Status: DC
  Administered 2012-07-04 – 2012-07-19 (×5)

## 2012-06-25 NOTE — Progress Notes (Signed)
Report has been called to [facility], in [place] who will be receiving patient.

## 2012-06-25 NOTE — Progress Notes (Signed)
Inpatient Diabetes Program Recommendations  AACE/ADA: New Consensus Statement on Inpatient Glycemic Control (2013)  Target Ranges:  Prepandial:   less than 140 mg/dL      Peak postprandial:   less than 180 mg/dL (1-2 hours)      Critically ill patients:  140 - 180 mg/dL   Reason for Visit: Results for Margaret Hess, Margaret Hess (MRN [id number]) as of 06/25/2012 09:37  Ref. Range 06/24/2012 12:03 06/24/2012 16:48 06/24/2012 21:44 06/25/2012 06:39  Glucose-Capillary Latest Range: 70-99 mg/dL 409 (H) 811 (H) 914 (H) 168 (H)   Patient may benefit from the addition of basal insulin while in the hospital.  Consider Levemir 10 units daily.  Will follow.

## 2012-06-25 NOTE — Progress Notes (Signed)
Met with patient and informed her that I continue to await notification from [facility] re: approval for CIR admit. Will contact team as soon as authorization received. Please call with questions: [phone].

## 2012-06-25 NOTE — Progress Notes (Signed)
Admitting pt to Inpatient Rehab today. Received insurance authorization for CIR.  Dr Jomarie Longs notified and in agreement.  Please call for questions. [phone]

## 2012-06-25 NOTE — Progress Notes (Signed)
Speech Language Pathology Treatment Patient Details Name: Jahniah Haidar MRN: [id number] DOB: [birth year] Today's Date: 06/25/2012 Time: 1000-1040 SLP Time Calculation (min): 40 min  Assessment / Plan / Recommendation Clinical Impression  Pt. seen for aphasia treatment focusing on facilitation of receptive and expressive communication. Verbal expression has improved since previous session with pt. using 2-3 words at a time versus mostly single word responses.  Pt. answered biographical/temporal questions with 75% accuracy and yes/no questions with 77% accuracy with mod verbal/visual cues (previously both at 70%). Difficulty coordinating verbal and nonverbal answers for yes/no questions continues, however moderately improved (verbally stating "no" but, nodding head "yes"). "Yes/no" communication board was an effective technique for accurate responses.   Emergent awareness has improved and attempts to self correct. Significant improvements with verbal expression of thoughts in telegraphic utterances ( decreased syntax "rehab insurance").  Apraxic errors present with phoneme substitutions.  Sister present at end of session and educated on pt.'s progress thus far as well as how to utilize the cueing hierachy during communication. Will f/u to continue facilitating receptive and expressive language while in acute care setting.     SLP Plan  Continue with current plan of care       SLP Goals  SLP Goals Potential to Achieve Goals: Good Potential Considerations: Previous level of function;Family/community support Progress/Goals/Alternative treatment plan discussed with pt/caregiver and they: Agree SLP Goal #1: Correctly answer yes/no questions with various complexity 8/10 with moderate verbal cues.  SLP Goal #1 - Progress: Progressing toward goal SLP Goal #2: Increase intelligibility of speech to sentence level with moderate assist to utilize recommended speech strategies.  i.e. slow rate of speech,   increased vocal volume.  SLP Goal #3: Answer personal information questions with decreased perseveration with moderate verbal cues.  SLP Goal #3 - Progress: Progressing toward goal SLP Goal #4: Pt. will communicate basic wants/thoughts via multimodalities with moderate verbal/visual cueing. SLP Goal #4 - Progress: Progressing toward goal  General Temperature Spikes Noted: No Respiratory Status: Room air Behavior/Cognition: Cooperative;Alert Oral Cavity - Dentition: Adequate natural dentition Patient Positioning: Partially reclined  Oral Cavity - Oral Hygiene Does patient have any of the following "at risk" factors?: Other - dysphagia Brush patient's teeth BID with toothbrush (using toothpaste with fluoride): Yes   Treatment Treatment focused on: Aphasia;Patient/family/caregiver education Family/Caregiver Educated: Patient's sister Skilled Treatment: cueing hierarchy, increased response wait time   GO     Theotis Burrow 06/25/2012, 1:13 PM

## 2012-06-25 NOTE — Plan of Care (Addendum)
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 Co-morbidities: systolic congestive heart failure, diabetes with peripheral neuropathy  Functional Problem List  Patient demonstrates impairments in the following areas: Balance, Behavior, Bladder, Bowel, Cognition, Edema, Endurance, Linguistic, Medication Management, Motor, Nutrition, Pain, Perception, Safety, Sensory , Skin Integrity and Vision  Basic ADL's: eating, grooming, bathing, dressing and toileting Advanced ADL's: simple meal preparation  Transfers:  bed mobility, bed to chair, toilet, tub/shower and car Locomotion:  ambulation, wheelchair mobility and stairs  Additional Impairments:  Functional use of upper extremity, Swallowing, Communication  comprehension and expression and Social Cognition   problem solving and awareness  Anticipated Outcomes Item Anticipated Outcome  Eating/Swallowing  Mod I   Basic self-care    Tolieting  Min Assist  Bowel/Bladder  Cont. of bladder with timed toileting; remain continent of bowel  Transfers  Min A  Locomotion  Supervision w/c mobility  Communication  Min assist  Cognition  Supervision   Pain    2 or less on scale 0-10  Safety/Judgment  Supervision   Other     Therapy Plan: PT Frequency: 5 out of 7 days;1-2 X/day, 60-90 minutes OT Frequency: 1-2 X/day, 60-90 minutes SLP Frequency: 1-2 X/day, 30-60 minutes;5 out of 7 days   Team Interventions: Item RN PT OT SLP SW TR Other  Self Care/Advanced ADL Retraining         xcNeuromuscular Re-Education  x       Therapeutic Activities  x  x  x   UE/LE Strength Training/ROM  x    x   UE/LE Coordination Activities  x    x   Visual/Perceptual Remediation/Compensation         DME/Adaptive Equipment Instruction  x    x     Therapeutic Exercise  x    x   Balance/Vestibular Training  x    x   Patient/Family Education x x  x  x   Cognitive Remediation/Compensation    x  x   Functional Mobility Training  x    x   Ambulation/Gait Training  x       Stair Training  x       Wheelchair Propulsion/Positioning  x    x   Functional Statistician  x       Community Reintegration  x    x   Dysphagia/Aspiration Printmaker    x     Speech/Language Facilitation    x     Bladder Management x        Bowel Management x        Disease Management/Prevention         Pain Management x        Medication Management x        Skin Care/Wound Management         Splinting/Orthotics  x       Discharge Planning  x  x  x   Psychosocial Support      x                      Team Discharge Planning: Destination:  Home Projected Follow-up:  Nursing, PT, OT, SLP and Home Health Projected Equipment Needs:  Bedside Commode, Elevated Toilet Seat, Information systems manager, Biomedical engineer involved in discharge  planning:  Yes  MD ELOS: 3-4 weeks Medical Rehab Prognosis:  Good Assessment: 46 year old female with congestive heart failure was admitted with left hemiparesis and aphasia. Diagnosed with right MCA infarct and now requires CIR level PT, OT, speech therapy as well as 24 7 rehabilitation RN and M.D.

## 2012-06-25 NOTE — H&P (Signed)
Physical Medicine and Rehabilitation Admission H&P    No chief complaint on file. : [id number] Pinch is a 46 y.o. right-handed female with history of migraine headaches, hypertension and diabetes mellitus with peripheral neuropathy and congestive heart failure. Patient initially presented 06/15/2012 with dyspnea found to have nonischemic cardiomyopathy with ejection fraction of 17%. An echocardiogram is completed showing severe global hypokinesis with normal left ventricular cavity size and a left ventricular apical thrombus as well as grade 3 diastolic dysfunction. The patient had an MRI on 06/17/2012 which confirmed a small apical left ventricular thrombus. The patient was placed on Coumadin therapy and discharge from the hospital 06/19/2012 1 once INR was therapeutic. She was also given a prescription for 3 days of cross coverage with Lovenox 80 mg twice daily. On 06/19/2012 she developed acute onset of left-sided weakness and difficulty speaking. She was readmitted to the hospital 06/20/2012 with MRI of the brain showing acute right MCA infarct with cerebral edema, mild midline shift and hemorrhagic transformation.  MRA of the head showed acute occlusion of the right M1 segment compatible with embolus causing right MCA infarction. Carotid Dopplers completed 06/21/2012 showing no significant ICA stenosis. Patient did not receive TPA. Followup neurology service as well as cardiology services advised to continue Coumadin therapy with latest INR 2.54. Noted admission INR of 2.15. She is presently on a dysphagia 3 thin liquid diet. Followup by physical therapy, occupational therapy and speech therapy with therapies ongoing and recommended physical medicine rehabilitation consult to consider inpatient rehabilitation services   Aphasic- able to repeat simple words, sparse verbal output "tingling" points to thigh, says yes to "pain" Review of Systems  Unable to perform ROS   Past Medical History    Diagnosis Date  . Migraines   . Hypertension   . Diabetes mellitus    History reviewed. No pertinent past surgical history. History reviewed. No pertinent family history. Social History:  reports that she has never smoked. She has never used smokeless tobacco. She reports that she drinks alcohol. She reports that she does not use illicit drugs. Allergies: No Known Allergies Medications Prior to Admission  Medication Sig Dispense Refill  . buPROPion (WELLBUTRIN) 75 MG tablet Take 75 mg by mouth daily.       . carvedilol (COREG) 3.125 MG tablet Take 1 tablet (3.125 mg total) by mouth 2 (two) times daily with a meal.  30 tablet  0  . digoxin (LANOXIN) 0.25 MG tablet Take 1 tablet (0.25 mg total) by mouth daily.  30 tablet  0  . docusate sodium (COLACE) 100 MG capsule Take 1 capsule (100 mg total) by mouth 2 (two) times daily.  10 capsule  0  . DULoxetine (CYMBALTA) 20 MG capsule Take 20 mg by mouth daily.      Marland Kitchen enoxaparin (LOVENOX) 80 MG/0.8ML injection Inject 80mg  SQ q 12 hours for 5 doses.  5 Syringe  0  . ferrous sulfate (FERROUSUL) 325 (65 FE) MG tablet Take 1 tablet (325 mg total) by mouth 2 (two) times daily.  30 tablet  3  . furosemide (LASIX) 20 MG tablet Take 1 tablet (20 mg total) by mouth daily.  30 tablet  0  . Liraglutide (VICTOZA [place]) Inject 1.2 mLs into the skin daily.       Marland Kitchen lisinopril (PRINIVIL,ZESTRIL) 10 MG tablet Take 1 tablet (10 mg total) by mouth 2 (two) times daily.  30 tablet  0  . metFORMIN (GLUCOPHAGE) 500 MG tablet Take 500 mg by mouth 2 (two)  times daily with a meal.      . potassium chloride SA (K-DUR,KLOR-CON) 20 MEQ tablet Take 1 tablet (20 mEq total) by mouth 2 (two) times daily.  30 tablet  0  . spironolactone (ALDACTONE) 25 MG tablet Take 1 tablet (25 mg total) by mouth daily.  30 tablet  0  . warfarin (COUMADIN) 5 MG tablet Take 1 tablet (5 mg total) by mouth daily.  2 tablet  0  . zolpidem (AMBIEN) 10 MG tablet Take 10 mg by mouth at bedtime as needed.  For sleep        Home:     Functional History:    Functional Status:  Mobility:          ADL:    Cognition: Cognition Orientation Level: Oriented to person     Blood pressure 115/70, pulse 92, temperature 98.5 F (36.9 C), temperature source Oral, resp. rate 20, height 5\' 6"  (1.676 m), weight 79.924 kg (176 lb 3.2 oz), SpO2 94.00%. Physical Exam  Vitals reviewed.  Constitutional: She appears well-developed.  HENT:  Head: Normocephalic.  Eyes:  Pupils round and reactive to light  Neck: Neck supple. No thyromegaly present.  Cardiovascular: Normal rate.  Pulmonary/Chest: Breath sounds normal. No respiratory distress. She has no wheezes.  Abdominal: Bowel sounds are normal. She exhibits no distension. There is no tenderness.  Neurological: She is alert.  Patient is aphasic appears mostly to be expressive in nature. She did follow basic verbal commands as seeing touching her nose and her left ear. She was able to state her first name . Withdrawal to deep stimuli Skin: Skin is warm and dry Sensation difficult to assess but unable to correctly ID which toes is touched on L side intact on R side Motor 5/5 in RUE 5/5 in R LE 0/5 in LUE 0/5 in LLE Tone is reduced on the left side Sitting balance is poor   Results for orders placed during the hospital encounter of 06/25/12 (from the past 48 hour(s))  GLUCOSE, CAPILLARY     Status: Abnormal   Collection Time   06/25/12  4:50 PM      Component Value Range Comment   Glucose-Capillary 232 (*) 70 - 99 mg/dL    No results found.  Post Admission Physician Evaluation: 1. Functional deficits secondary  to right MCA infarct with left flaccid hemiparesis as well as crossed aphasia. 2. Patient is admitted to receive collaborative, interdisciplinary care between the physiatrist, rehab nursing staff, and therapy team. 3. Patient's level of medical complexity and substantial therapy needs in context of that medical necessity  cannot be provided at a lesser intensity of care such as a SNF. 4. Patient has experienced substantial functional loss from his/her baseline which was documented above under the "Functional History" and "Functional Status" headings.  Judging by the patient's diagnosis, physical exam, and functional history, the patient has potential for functional progress which will result in measurable gains while on inpatient rehab.  These gains will be of substantial and practical use upon discharge  in facilitating mobility and self-care at the household level. 5. Physiatrist will provide 24 hour management of medical needs as well as oversight of the therapy plan/treatment and provide guidance as appropriate regarding the interaction of the two. 6. 24 hour rehab nursing will assist with bladder management, bowel management, safety, skin/wound care, disease management, medication administration, pain management and patient education  and help integrate therapy concepts, techniques,education, etc. 7. PT will assess and treat for:  Pre-gait,  gait, endurance, safety, equipment.  Goals are: min assist wheelchair level. 8. OT will assess and treat for: cognitive perceptual skills,ADLs, neuromuscular reeducation, safety, endurance, equipment.   Goals are: min assist ADLs. 9. SLP will assess and treat for: language, cognition, and swallow  Goals are: express basic biographical information, indicate basic medical needs.. 10. Case Management and Social Worker will assess and treat for psychological issues and discharge planning. 11. Team conference will be held weekly to assess progress toward goals and to determine barriers to discharge. 12. Patient will receive at least 3 hours of therapy per day at least 5 days per week. 13. ELOS: 3-4 weeks      Prognosis:  good   Medical Problem List and Plan: 1. acute cardioembolic infarct right middle cerebral artery secondary to known LV mural thrombus 2. DVT  Prophylaxis/Anticoagulation: Chronic Coumadin therapy with a goal INR of 2-2.5 secondary hemorrhagic transformation 3. Neuropsych: This patient is not capable of making decisions on his/her own behalf. 4. 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 Lisinopril 2.5 mg twice a day, Lanoxin 0.125 mg daily. Monitor with increased mobility. Cardiology to follow up as needed(Dr. Bensimhon) 5. Diabetes mellitus. Patient currently on sliding scale insulin. Patient on Glucophage 500 mg twice a day prior to admission and victoza 1.2 mL subcutaneously daily. Check blood sugars a.c. and at bedtime resume oral agents as tolerated 6. Mood/depression. Patient on Wellbutrin prior to admission 75 mg daily. We'll discuss resuming this medication. Provide emotional support and positive reinforcement 7.  Sensory dysesthesia related to parietal infarct and hemorrhagic transformation, will trial gabapentin 06/25/2012, 5:32 PM

## 2012-06-25 NOTE — Progress Notes (Signed)
SLP has reviewed and agrees with student's note below.  Margaret Hess Margaret Hess M.Ed CCC-SLP Pager [phone]  06/25/2012  

## 2012-06-25 NOTE — Progress Notes (Signed)
Advanced Heart Failure Rounding Note   Subjective:    46 yo history of DM,  HTN, NICM,  EF <20%.  LV thrombus, and Grade 3 diastolic dysfunction.  Mod TR.   Discharged from [facility] 06/19/12 after being treated for acute systolic heart failure and LV thrombus. Discharged on loveneox and coumadin. HF medications.   Presented to ED 06/20/12 with difficulty speaking and L sided weakness. CT of head negative. 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  Able to say more words. Weight stable. Denies orthopnea/PND/dyspnea.  No in CIR.     Objective:     Vital Signs:   Temp:  [97.9 F (36.6 C)-98.8 F (37.1 C)] 98.5 F (36.9 C) (10/24 1653) Pulse Rate:  [91-97] 92  (10/24 1653) Resp:  [18-20] 20  (10/24 1653) BP: (102-142)/(58-95) 115/70 mmHg (10/24 1653) SpO2:  [94 %-100 %] 94 % (10/24 1653) Weight:  [76.2 kg (167 lb 15.9 oz)-79.924 kg (176 lb 3.2 oz)] 79.924 kg (176 lb 3.2 oz) (10/24 1653)    Weight change: Filed Weights   06/25/12 1653  Weight: 79.924 kg (176 lb 3.2 oz)    Intake/Output:  No intake or output data in the 24 hours ending 06/25/12 1719   Physical Exam:            General:  Well appearing. No resp difficulty HEENT: L facial droop Neck: supple. JVP flat.  Carotids 2+ bilat; no bruits. No lymphadenopathy or thryomegaly appreciated. Cor: PMI nondisplaced. Tachycardic No gallops, rubs, or murmurs. Lungs: clear Abdomen: soft, nontender, nondistended. No hepatosplenomegaly. No bruits or masses. Good bowel sounds. Extremities: no cyanosis, clubbing, rash, edema L arm flaccid Neuro: alert & orientedx3, cranial nerves grossly intact. moves all 4 extremities w/o difficulty. Affect pleasant     Telemetry:   Labs: Basic Metabolic Panel:  Lab 06/25/12 2595 06/24/12 0850 06/23/12 0500 06/22/12 0332 06/21/12 0434 06/20/12 0619    NA 137 134* 135 130* 136 --  K 4.3 4.1 4.0 4.2 4.2 --  CL 102 99 97 97 97 --  CO2 23 23 25 21 24  --  GLUCOSE 167* 313* 172* 144* 210* --  BUN 17 15 15 18 11  --  CREATININE 0.57 0.55 0.64 0.56 0.37* --  CALCIUM 8.8 8.8 9.1 -- -- --  MG -- -- 2.1 -- -- 1.8  PHOS -- -- 3.0 -- -- 3.7    Liver Function Tests:  Lab 06/19/12 2332  AST 61*  ALT 184*  ALKPHOS 55  BILITOT 0.3  PROT 6.5  ALBUMIN 2.5*   No results found for this basename: LIPASE:5,AMYLASE:5 in the last 168 hours No results found for this basename: AMMONIA:3 in the last 168 hours  CBC:  Lab 06/23/12 0500 06/22/12 0332 06/21/12 0434 06/20/12 0619 06/19/12 2333 06/19/12 2332  WBC 13.8* 13.2* 13.6* 12.6* -- 10.1  NEUTROABS -- -- -- -- -- 5.4  HGB 15.2* 13.9 15.0 15.3* 13.6 --  HCT 45.0 39.7 41.7 43.5 40.0 --  MCV 89.1 86.3 84.4 85.6 -- 87.0  PLT 325 217 255 235 -- 217    Cardiac Enzymes:  Lab 06/19/12 2333  CKTOTAL --  CKMB --  CKMBINDEX --  TROPONINI <0.30    BNP: BNP (last 3 results)  Basename 06/17/12 0455 06/15/12 1720  PROBNP 1930.0* 6932.0*       Imaging: No results found.   Medications:  Scheduled Medications:    . carvedilol  3.125 mg Oral BID WC  . digoxin  0.125 mg Oral Daily  . insulin aspart  0-15 Units Subcutaneous TID WC  . lisinopril  5 mg Oral BID  . pantoprazole  40 mg Oral QHS  . warfarin  5 mg Oral ONCE-1800  . Warfarin - Pharmacist Dosing Inpatient   Does not apply q1800  . DISCONTD: Warfarin - Pharmacist Dosing Inpatient   Does not apply q1800    Infusions:    PRN Medications: acetaminophen, albuterol, ondansetron (ZOFRAN) IV, ondansetron, polyethylene glycol, sorbitol   Assessment:  1. CVA, acute hemorrhagic R MCA    --c/b expressive aphasia and L-sided hemiparesis 2. 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 NICM, EF <20% 4.  LV thrombus  5. DM   6. Hypertension  Plan/Discussion:   Volume status stable. Weight stable. Renal  function stable. Will increase lisinopril 5 mg bid.    Coumadin management per Neuro. Pharmacy to dose.   5:19 PM Arvilla Meres, MD

## 2012-06-25 NOTE — Progress Notes (Signed)
ANTICOAGULATION CONSULT NOTE - Follow Up Consult  Pharmacy Consult for Coumadin Indication: LV thrombus  No Known Allergies  Patient Measurements: Height: 5\' 7"  (170.2 cm) Weight: 167 lb 15.9 oz (76.2 kg) IBW/kg (Calculated) : 61.6   Vital Signs: Temp: 98 F (36.7 C) (10/24 0600) Temp src: Oral (10/24 0600) BP: 137/95 mmHg (10/24 0600) Pulse Rate: 92  (10/24 0600)  Labs:  Margaret Hess 06/25/12 [id number] 06/24/12 0850 06/24/12 0620 06/23/12 0500  HGB -- -- -- 15.2*  HCT -- -- -- 45.0  PLT -- -- -- 325  APTT -- -- -- --  LABPROT 19.1* -- 23.6* 26.1*  INR 1.66* -- 2.21* 2.54*  HEPARINUNFRC -- -- -- --  CREATININE 0.57 0.55 -- 0.64  CKTOTAL -- -- -- --  CKMB -- -- -- --  TROPONINI -- -- -- --    Estimated Creatinine Clearance: 93.5 ml/min (by C-G formula based on Cr of 0.57).  Assessment: 45yof [facility] on [facility]18 on coumadin 5mg  daily and lovenox 80mg  q12 for new LV thrombus. INR 2.15 at time of discharge after only 2 doses of coumadin. Returns to the hospital that same night with difficulty speaking and L sided weakness. 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   Coumadin continued (ok by neuro) at home dose of 5mg  daily. INR is subtherapeutic today, likely due to two lower doses. Will continue to titrate warfarin conservatively given recent stroke.  Goal of Therapy:  INR 2-3 Monitor platelets by anticoagulation protocol: Yes   Plan:  1) Repeat Coumadin 5mg  x 1 2) Follow up INR in AM   Margaret Hess 06/25/2012,8:52 AM

## 2012-06-25 NOTE — Progress Notes (Signed)
Advanced Heart Failure Rounding Note   Subjective:    46 yo history of DM,  HTN, NICM,  EF <20%.  LV thrombus, and Grade 3 diastolic dysfunction.  Mod TR.   Discharged from [facility] 06/19/12 after being treated for acute systolic heart failure and LV thrombus. Discharged on loveneox and coumadin. HF medications.   Presented to ED 06/20/12 with difficulty speaking and L sided weakness. CT of head negative. 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  Expressive aphasia and L sided hemiparesis.  Dopamine stopped 06/21/12.  Yesterday added Carvedilol 3.125 mg bid.   Able to say more words. Weight stable. CIR pending.  Denies orthopnea/PND/dyspnea.     Objective:     Vital Signs:   Temp:  [97.8 F (36.6 C)-98.8 F (37.1 C)] 98 F (36.7 C) (10/24 0600) Pulse Rate:  [91-99] 92  (10/24 0600) Resp:  [18] 18  (10/24 0600) BP: (102-142)/(58-95) 137/95 mmHg (10/24 0600) SpO2:  [96 %-100 %] 97 % (10/24 0600) Weight:  [76.2 kg (167 lb 15.9 oz)] 76.2 kg (167 lb 15.9 oz) (10/24 0500) Last BM Date: 06/23/12  Weight change: Filed Weights   06/23/12 0500 06/24/12 0500 06/25/12 0500  Weight: 75.9 kg (167 lb 5.3 oz) 75.9 kg (167 lb 5.3 oz) 76.2 kg (167 lb 15.9 oz)    Intake/Output:  No intake or output data in the 24 hours ending 06/25/12 0847   Physical Exam:            General:  Well appearing. No resp difficulty HEENT: L facial droop Neck: supple. JVP flat.  Carotids 2+ bilat; no bruits. No lymphadenopathy or thryomegaly appreciated. Cor: PMI nondisplaced. Tachycardic No gallops, rubs, or murmurs. Lungs: clear Abdomen: soft, nontender, nondistended. No hepatosplenomegaly. No bruits or masses. Good bowel sounds. Extremities: no cyanosis, clubbing, rash, edema L arm flaccid Neuro: alert & orientedx3, cranial nerves grossly intact. moves all 4 extremities w/o difficulty. Affect pleasant      Telemetry:   Labs: Basic Metabolic Panel:  Lab 06/25/12 1610 06/24/12 0850 06/23/12 0500 06/22/12 0332 06/21/12 0434 06/20/12 0619  NA 137 134* 135 130* 136 --  K 4.3 4.1 4.0 4.2 4.2 --  CL 102 99 97 97 97 --  CO2 23 23 25 21 24  --  GLUCOSE 167* 313* 172* 144* 210* --  BUN 17 15 15 18 11  --  CREATININE 0.57 0.55 0.64 0.56 0.37* --  CALCIUM 8.8 8.8 9.1 -- -- --  MG -- -- 2.1 -- -- 1.8  PHOS -- -- 3.0 -- -- 3.7    Liver Function Tests:  Lab 06/19/12 2332  AST 61*  ALT 184*  ALKPHOS 55  BILITOT 0.3  PROT 6.5  ALBUMIN 2.5*   No results found for this basename: LIPASE:5,AMYLASE:5 in the last 168 hours No results found for this basename: AMMONIA:3 in the last 168 hours  CBC:  Lab 06/23/12 0500 06/22/12 0332 06/21/12 0434 06/20/12 0619 06/19/12 2333 06/19/12 2332  WBC 13.8* 13.2* 13.6* 12.6* -- 10.1  NEUTROABS -- -- -- -- -- 5.4  HGB 15.2* 13.9 15.0 15.3* 13.6 --  HCT 45.0 39.7 41.7 43.5 40.0 --  MCV 89.1 86.3 84.4 85.6 -- 87.0  PLT 325 217 255 235 -- 217    Cardiac Enzymes:  Lab 06/19/12 2333  CKTOTAL --  CKMB --  CKMBINDEX --  TROPONINI <0.30    BNP: BNP (last 3 results)  Basename 06/17/12 0455  06/15/12 1720  PROBNP 1930.0* 6932.0*       Imaging: No results found.   Medications:     Scheduled Medications:    . carvedilol  3.125 mg Oral BID WC  . digoxin  0.125 mg Oral Daily  . insulin aspart  0-15 Units Subcutaneous TID WC  . insulin aspart  0-5 Units Subcutaneous QHS  . lisinopril  2.5 mg Oral BID  . pantoprazole  40 mg Oral QHS  . sodium chloride  3 mL Intravenous Q12H  . warfarin  5 mg Oral ONCE-1800  . Warfarin - Pharmacist Dosing Inpatient   Does not apply q1800    Infusions:    . sodium chloride Stopped (06/22/12 1100)  . DISCONTD: DOPamine Stopped (06/21/12 1800)    PRN Medications: sodium chloride, sodium chloride, acetaminophen, albuterol, sodium chloride   Assessment:  1. CVA, acute hemorrhagic R MCA    --c/b  expressive aphasia and L-sided hemiparesis 2. 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 NICM, EF <20% 4.  LV thrombus  5. DM   6. Hypertension  Plan/Discussion:   Volume status stable. Weight stable. Renal function stable. Increase lisinopril 5 mg bid.   We will continue to follow if she transfers to CIR.     Coumadin management per Neuro. Pharmacy to dose.   8:47 AM Emily Forse]

## 2012-06-25 NOTE — Progress Notes (Signed)
Patient admit to inpatient rehab at 1620, came from 4N09.  Riley Lam, RN gave report and transfer patient over to rehab.  Sister at bedside, oriented to unit.  Patient alert to person and place.  3 side rails up, bed alarm on, call bell within reach.

## 2012-06-26 ENCOUNTER — Inpatient Hospital Stay (HOSPITAL_COMMUNITY): Payer: BC Managed Care – PPO | Admitting: Speech Pathology

## 2012-06-26 ENCOUNTER — Inpatient Hospital Stay (HOSPITAL_COMMUNITY): Payer: BC Managed Care – PPO | Admitting: Occupational Therapy

## 2012-06-26 ENCOUNTER — Inpatient Hospital Stay (HOSPITAL_COMMUNITY): Payer: BC Managed Care – PPO | Admitting: Physical Therapy

## 2012-06-26 DIAGNOSIS — E119 Type 2 diabetes mellitus without complications: Secondary | ICD-10-CM

## 2012-06-26 DIAGNOSIS — I69991 Dysphagia following unspecified cerebrovascular disease: Secondary | ICD-10-CM

## 2012-06-26 DIAGNOSIS — I639 Cerebral infarction, unspecified: Secondary | ICD-10-CM | POA: Insufficient documentation

## 2012-06-26 DIAGNOSIS — G8110 Spastic hemiplegia affecting unspecified side: Secondary | ICD-10-CM

## 2012-06-26 HISTORY — DX: Cerebral infarction, unspecified: I63.9

## 2012-06-26 LAB — CBC WITH DIFFERENTIAL/PLATELET
Basophils Relative: 0 % (ref 0–1)
Eosinophils Absolute: 0.2 10*3/uL (ref 0.0–0.7)
Hemoglobin: 12.8 g/dL (ref 12.0–15.0)
Lymphs Abs: 4 10*3/uL (ref 0.7–4.0)
MCH: 29.7 pg (ref 26.0–34.0)
MCHC: 33 g/dL (ref 30.0–36.0)
Monocytes Relative: 9 % (ref 3–12)
Neutro Abs: 7.3 10*3/uL (ref 1.7–7.7)
Neutrophils Relative %: 57 % (ref 43–77)
Platelets: 389 10*3/uL (ref 150–400)
RBC: 4.31 MIL/uL (ref 3.87–5.11)

## 2012-06-26 LAB — COMPREHENSIVE METABOLIC PANEL
ALT: 34 U/L (ref 0–35)
Albumin: 2.6 g/dL — ABNORMAL LOW (ref 3.5–5.2)
Alkaline Phosphatase: 49 U/L (ref 39–117)
Chloride: 101 mEq/L (ref 96–112)
Potassium: 4.3 mEq/L (ref 3.5–5.1)
Sodium: 135 mEq/L (ref 135–145)
Total Bilirubin: 0.3 mg/dL (ref 0.3–1.2)
Total Protein: 7 g/dL (ref 6.0–8.3)

## 2012-06-26 LAB — GLUCOSE, CAPILLARY
Glucose-Capillary: 157 mg/dL — ABNORMAL HIGH (ref 70–99)
Glucose-Capillary: 288 mg/dL — ABNORMAL HIGH (ref 70–99)

## 2012-06-26 MED ORDER — WARFARIN SODIUM 5 MG PO TABS
5.0000 mg | ORAL_TABLET | Freq: Once | ORAL | Status: AC
  Administered 2012-06-26: 5 mg via ORAL
  Filled 2012-06-26: qty 1

## 2012-06-26 MED ORDER — INFLUENZA VIRUS VACC SPLIT PF IM SUSP
0.5000 mL | INTRAMUSCULAR | Status: AC
  Filled 2012-06-26: qty 0.5

## 2012-06-26 NOTE — Progress Notes (Signed)
I have read and agree with the following evaluation and treatment session.  Edman Circle, PT, DPT

## 2012-06-26 NOTE — Evaluation (Signed)
Speech Language Pathology Assessment and Plan  Patient Details  Name: Margaret Hess MRN: [id number] Date of Birth: April 25, 1966  SLP Diagnosis: Aphasia;Cognitive Impairments;Dysphagia  Rehab Potential: Good ELOS: 3-4 weeks   Today's Date: 06/26/2012 Time: 1040-1130 Time Calculation (min): 50 min  Problem List:  Patient Active Problem List  Diagnosis  . Acute systolic CHF (congestive heart failure), NYHA class 4  . HTN (hypertension)  . Anxiety  . Moderate Bilateral pleural effusion  . Dyspnea on exertion  . LV (left ventricular) mural thrombus without MI  . Chronic passive hepatic congestion  . Acute respiratory failure with hypoxia  . Positive D dimer  . Abnormal coagulation profile  . Diabetes mellitus  . Leukocytosis  . Pulmonary hypertension  . Acute diastolic heart failure, NYHA class 3  . Acute combined systolic and diastolic heart failure  . Nonischemic cardiomyopathy  . Stroke, embolic  . Cardiomyopathy  . Chronic systolic heart failure  . CVA (cerebral infarction)   Past Medical History:  Past Medical History  Diagnosis Date  . Migraines   . Hypertension   . Diabetes mellitus    Past Surgical History: History reviewed. No pertinent past surgical history.  Assessment / Plan / Recommendation Clinical Impression   Margaret Hess is a 46 y.o. right-handed female with history of migraine headaches, hypertension and diabetes mellitus with peripheral neuropathy and congestive heart failure. Patient initially presented 06/15/2012 with dyspnea found to have nonischemic cardiomyopathy with ejection fraction of 17%. An echocardiogram is completed showing severe global hypokinesis with normal left ventricular cavity size and a left ventricular apical thrombus as well as grade 3 diastolic dysfunction. The patient had an MRI on 06/17/2012 which confirmed a small apical left ventricular thrombus. The patient was placed on Coumadin therapy and discharge from the hospital  06/19/2012 1 once INR was therapeutic. She was also given a prescription for 3 days of cross coverage with Lovenox 80 mg twice daily. On 06/19/2012 she developed acute onset of left-sided weakness and difficulty speaking. She was readmitted to the hospital 06/20/2012 with MRI of the brain showing acute right MCA infarct with cerebral edema, mild midline shift and hemorrhagic transformation. MRA of the head showed acute occlusion of the right M1 segment compatible with embolus causing right MCA infarction. Carotid Dopplers completed 06/21/2012 showing no significant ICA stenosis. Patient did not receive TPA. Follow-up neurology service as well as cardiology services advised to continue Coumadin therapy with latest INR 2.54. Noted admission INR of 2.15. She is presently on a dysphagia 3 thin liquid diet.   Patient admitted to [facility] 06/25/12 and upon evaluation today she presents with left sided weakness resulting in mild oral based dysphagia, suspected high level cognitive deficits which will be assess during future diagnostic treatment session. Patient also present with mild receptive and moderately-severe expressive aphasia which is characterized by impaired verbal expression with phonemic and semantic paraphasias, perseverative errors and decreased self-monitoring.  Patient was independent prior to admission: working, driving and completing advanced ADLs.  As a result, it is recommended that she receive skilled SLP services during CIR stay to maximize functional independence and ability to verbally express basic wants and needs.  It is anticipated that upon discharge patient will require 24/7 supervision assist with recommendation for home health therapy and then outpatient.     SLP Assessment  Patient will need skilled Speech Lanaguage Pathology Services during CIR admission    Recommendations  Follow up Recommendations: 24 hour supervision/assistance;Home Health SLP;Outpatient  SLP Equipment Recommended: None  recommended by SLP    SLP Frequency 1-2 X/day, 30-60 minutes;5 out of 7 days   SLP Treatment/Interventions Cognitive remediation/compensation;Cueing hierarchy;Dysphagia/aspiration precaution training;Environmental controls;Functional tasks;Internal/external aids;Multimodal communication approach;Patient/family education;Speech/Language facilitation;Therapeutic Activities    Pain Pain Assessment Pain Assessment: No/denies pain Prior Functioning Cognitive/Linguistic Baseline: Within functional limits  Short Term Goals: Week 1: SLP Short Term Goal 1 (Week 1): Patient will consume regular textures and thin liquids with intermittent supervision cues and no overt s/s of aspiration. SLP Short Term Goal 2 (Week 1): Patient will verbally complete automatic sequences with min assist phonemic cues. SLP Short Term Goal 3 (Week 1): Patient will utilize multi-modal communication strategies to express basic wants and needs with supervision assist. SLP Short Term Goal 4 (Week 1): Patient will verbally produce phrase level (3-4 word) utterances to describe situations, pictures, etc. with mod assist verbal and written cues.  SLP Short Term Goal 5 (Week 1): Patient will self monitor accuracy of verbal expression with mod assist verbal and non-verbal cues.   See FIM for current functional status Refer to Care Plan for Long Term Goals  Recommendations for other services: None at this time.  Discharge Criteria: Patient will be discharged from SLP if patient refuses treatment 3 consecutive times without medical reason, if treatment goals not met, if there is a change in medical status, if patient makes no progress towards goals or if patient is discharged from hospital.  The above assessment, treatment plan, treatment alternatives and goals were discussed and mutually agreed upon: by patient and by family  Charlane Ferretti., CCC-SLP [phone]  Shuayb Schepers 06/26/2012, 5:19  PM

## 2012-06-26 NOTE — Progress Notes (Signed)
ANTICOAGULATION CONSULT NOTE - Follow Up Consult  Pharmacy Consult for Coumadin Indication: LV thrombus  No Known Allergies  Patient Measurements: Height: 5\' 6"  (167.6 cm) Weight: 162 lb (73.483 kg) IBW/kg (Calculated) : 59.3   Vital Signs: Temp: 97.8 F (36.6 C) (10/25 0630) Temp src: Oral (10/25 0630) BP: 97/66 mmHg (10/25 0630) Pulse Rate: 84  (10/25 0630)  Labs:  Basename 06/26/12 0625 06/25/12 0635 06/24/12 0850 06/24/12 0620  HGB 12.8 -- -- --  HCT 38.8 -- -- --  PLT 389 -- -- --  APTT -- -- -- --  LABPROT 21.4* 19.1* -- 23.6*  INR 1.94* 1.66* -- 2.21*  HEPARINUNFRC -- -- -- --  CREATININE 0.54 0.57 0.55 --  CKTOTAL -- -- -- --  CKMB -- -- -- --  TROPONINI -- -- -- --    Estimated Creatinine Clearance: 90.2 ml/min (by C-G formula based on Cr of 0.54).  Assessment: 45yof discharged from [facility] on 10/18 on coumadin 5mg  daily and lovenox 80mg  q12 for new LV thrombus. INR 2.15 at time of discharge after only 2 doses of coumadin. Returns to the hospital that same night with difficulty speaking and L sided weakness. 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   Coumadin continued (ok by neuro) at home dose of 5mg  daily. INR is subtherapeutic today, likely due to two lower doses. Will continue to titrate warfarin conservatively given recent stroke.  Goal of Therapy:  INR 2-3 Monitor platelets by anticoagulation protocol: Yes   Plan:  1) Repeat Coumadin 5mg  x 1 2) Follow up INR in AM   Drue Stager 06/26/2012,8:33 AM

## 2012-06-26 NOTE — Progress Notes (Signed)
Patient ID: Margaret Hess, female   DOB: 1966/04/07, 46 y.o.   MRN: [id number]  Subjective/Complaints: Y/N inconsistant, follows simple command, has R/L discrimination problem  Review of Systems  Unable to perform ROS: medical condition     Objective: Vital Signs: Blood pressure 97/66, pulse 84, temperature 97.8 F (36.6 C), temperature source Oral, resp. rate 19, height 5\' 6"  (1.676 m), weight 73.483 kg (162 lb), SpO2 98.00%. No results found. Results for orders placed during the hospital encounter of 06/25/12 (from the past 72 hour(s))  GLUCOSE, CAPILLARY     Status: Abnormal   Collection Time   06/25/12  4:50 PM      Component Value Range Comment   Glucose-Capillary 232 (*) 70 - 99 mg/dL   CBC WITH DIFFERENTIAL     Status: Abnormal   Collection Time   06/26/12  6:25 AM      Component Value Range Comment   WBC 12.8 (*) 4.0 - 10.5 K/uL    RBC 4.31  3.87 - 5.11 MIL/uL    Hemoglobin 12.8  12.0 - 15.0 g/dL    HCT 40.9  81.1 - 91.4 %    MCV 90.0  78.0 - 100.0 fL    MCH 29.7  26.0 - 34.0 pg    MCHC 33.0  30.0 - 36.0 g/dL    RDW 78.2  95.6 - 21.3 %    Platelets 389  150 - 400 K/uL    Neutrophils Relative 57  43 - 77 %    Neutro Abs 7.3  1.7 - 7.7 K/uL    Lymphocytes Relative 31  12 - 46 %    Lymphs Abs 4.0  0.7 - 4.0 K/uL    Monocytes Relative 9  3 - 12 %    Monocytes Absolute 1.2 (*) 0.1 - 1.0 K/uL    Eosinophils Relative 2  0 - 5 %    Eosinophils Absolute 0.2  0.0 - 0.7 K/uL    Basophils Relative 0  0 - 1 %    Basophils Absolute 0.0  0.0 - 0.1 K/uL   COMPREHENSIVE METABOLIC PANEL     Status: Abnormal   Collection Time   06/26/12  6:25 AM      Component Value Range Comment   Sodium 135  135 - 145 mEq/L    Potassium 4.3  3.5 - 5.1 mEq/L    Chloride 101  96 - 112 mEq/L    CO2 23  19 - 32 mEq/L    Glucose, Bld 167 (*) 70 - 99 mg/dL    BUN 15  6 - 23 mg/dL    Creatinine, Ser 0.86  0.50 - 1.10 mg/dL    Calcium 8.9  8.4 - 57.8 mg/dL    Total Protein 7.0  6.0 - 8.3 g/dL     Albumin 2.6 (*) 3.5 - 5.2 g/dL    AST 25  0 - 37 U/L    ALT 34  0 - 35 U/L    Alkaline Phosphatase 49  39 - 117 U/L    Total Bilirubin 0.3  0.3 - 1.2 mg/dL    GFR calc non Af Amer >90  >90 mL/min    GFR calc Af Amer >90  >90 mL/min   PROTIME-INR     Status: Abnormal   Collection Time   06/26/12  6:25 AM      Component Value Range Comment   Prothrombin Time 21.4 (*) 11.6 - 15.2 seconds    INR 1.94 (*) 0.00 -  1.49   GLUCOSE, CAPILLARY     Status: Abnormal   Collection Time   06/26/12  7:14 AM      Component Value Range Comment   Glucose-Capillary 157 (*) 70 - 99 mg/dL      HEENT: normal Cardio: RRR Resp: CTA B/L GI: BS positive Extremity:  No Edema Skin:   Intact Neuro: Abnormal Sensory absent light touch and proprio, positive deep pain, Abnormal Motor 0/5 in LUE and LLE, Abnormal FMC Tone  reduced on L side, Tone:  Hypotonia, Aphasic, Inattention and Apraxic Musc/Skel:  Swelling L dorsum hand ecchymosis and swelling-mild   Assessment/Plan: 1. Functional deficits secondary to R MCA infarct L HP, crossed aphasia,  which require 3+ hours per day of interdisciplinary therapy in a comprehensive inpatient rehab setting. Physiatrist is providing close team supervision and 24 hour management of active medical problems listed below. Physiatrist and rehab team continue to assess barriers to discharge/monitor patient progress toward functional and medical goals. FIM: FIM - Bathing Bathing Steps Patient Completed: Chest;Left Arm;Abdomen;Right upper leg;Left upper leg Bathing: 3: Mod-Patient completes 5-7 15f 10 parts or 50-74%  FIM - Upper Body Dressing/Undressing Upper body dressing/undressing steps patient completed: Put head through opening of pull over shirt/dress;Pull shirt over trunk Upper body dressing/undressing: 2: Max-Patient completed 25-49% of tasks FIM - Lower Body Dressing/Undressing Lower body dressing/undressing: 1: Total-Patient completed less than 25% of tasks         FIM - Bed/Chair Transfer Bed/Chair Transfer: 1: Supine > Sit: Total A (helper does all/Pt. < 25%);2: Bed > Chair or W/C: Max A (lift and lower assist)     Comprehension Comprehension Mode: Auditory Comprehension: 5-Follows basic conversation/direction: With extra time/assistive device  Expression Expression Mode: Verbal (1 or 2 words occasional) Expression: 2-Expresses basic 25 - 49% of the time/requires cueing 50 - 75% of the time. Uses single words/gestures.           Medical Problem List and Plan:  1. 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 DVT Prophylaxis/Anticoagulation: Chronic Coumadin therapy with a goal INR of 2-2.5 secondary hemorrhagic transformation  3. Neuropsych: This patient is not capable of making decisions on his/her own behalf.  4. 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 Lisinopril 2.5 mg twice a day, Lanoxin 0.125 mg daily. Monitor with increased mobility. Cardiology to follow up as needed(Dr. Bensimhon)  5. Diabetes mellitus. Patient currently on sliding scale insulin. Patient on Glucophage 500 mg twice a day prior to admission and victoza 1.2 mL subcutaneously daily. Check blood sugars a.c. and at bedtime resume oral agents as tolerated  6. Mood/depression. Patient on Wellbutrin prior to admission 75 mg daily. We'll discuss resuming this medication. Provide emotional support and positive reinforcement  7. Sensory dysesthesia related to parietal infarct and hemorrhagic transformation, will trial gabapentin    LOS (Days) 1 A FACE TO FACE EVALUATION WAS PERFORMED  KIRSTEINS,ANDREW E 06/26/2012, 9:27 AM

## 2012-06-26 NOTE — Progress Notes (Signed)
Social Work Assessment and Plan Social Work Assessment and Plan  Patient Details  Name: Margaret Hess MRN: [id number] Date of Birth: 1966/02/08  Today's Date: 06/26/2012  Problem List:  Patient Active Problem List  Diagnosis  . Acute systolic CHF (congestive heart failure), NYHA class 4  . HTN (hypertension)  . Anxiety  . Moderate Bilateral pleural effusion  . Dyspnea on exertion  . LV (left ventricular) mural thrombus without MI  . Chronic passive hepatic congestion  . Acute respiratory failure with hypoxia  . Positive D dimer  . Abnormal coagulation profile  . Diabetes mellitus  . Leukocytosis  . Pulmonary hypertension  . Acute diastolic heart failure, NYHA class 3  . Acute combined systolic and diastolic heart failure  . Nonischemic cardiomyopathy  . Stroke, embolic  . Cardiomyopathy  . Chronic systolic heart failure  . CVA (cerebral infarction)   Past Medical History:  Past Medical History  Diagnosis Date  . Migraines   . Hypertension   . Diabetes mellitus    Past Surgical History: History reviewed. No pertinent past surgical history. Social History:  reports that she has never smoked. She has never used smokeless tobacco. She reports that she drinks alcohol. She reports that she does not use illicit drugs.  Family / Support Systems Marital Status: Single Patient Roles: Other (Comment) (Sister and Employee) Other Supports: Larita Fife Atkins-sister  [phone]-cell Anticipated Caregiver: Sister and friends are discussing a plan to be put into place Ability/Limitations of Caregiver: Sister is staying here in pt's apartment Caregiver Availability: 24/7 Family Dynamics: Pt and sister are very close and are really the only family each other have.  They will do whatever is needed for pt and she has very dedicated friends who are stepping up to assist.  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   Abuse/Neglect Physical Abuse: Denies Verbal Abuse: Denies Sexual Abuse: Denies Exploitation of patient/patient's resources: Denies Self-Neglect: Denies  Emotional Status Pt's affect, behavior adn adjustment status: Pt seems to understand what is said to her but unable to communicate in her words.  She does gesture and attempts to answer and trys.  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 Recent Psychosocial Issues: Other medical issues Pyschiatric History: History-depression/anxiety takes meds for and have seemed to help.  Unale to do depresison screen due to language deficits, but will monitor throughout her stay and may benefit from Neuro-psych later in her stay due to coping issues. Substance Abuse History: No issues  Patient / Family Perceptions, Expectations & Goals Pt/Family understanding of illness & functional limitations: Pt seems to understand her deficits, but has neglect.  Sister has a good understanding of her sister's issues and has spoken with the MD daily to keep updated.  Sister is somewhat anxious making sure she is doing everything she needs to do. Premorbid pt/family roles/activities: Sister, English Professor, Friend, Colleague, etc Anticipated changes in roles/activities/participation: resume Pt/family expectations/goals: Pt does make eye contact and tries to follow the conversation, she also answers when spoken too.  Sister states: " We are optimistic regarding her recovery, if anybody can  do it she can."  When pt able to commincate better will address with her.  Community  Resources [employer]: None Premorbid Home Care/DME Agencies: None Transportation available at discharge: Sister and friends Resource referrals recommended: Support group (specify) (CHF and CVA Support group)  Discharge Planning Living Arrangements: Alone Support Systems: Other relatives;Friends/neighbors Type of Residence: Private residence Insurance Resources: Media planner (specify) Barista) Financial Resources: Employment Surveyor, quantity Screen Referred: No Living Expenses: Psychologist, sport and exercise Management: Patient Do you have any problems obtaining your medications?: No Home Management: Patient Patient/Family Preliminary Plans: Return home with sister and friends assisting her.  Sister has closed up business on [place] and is here for however long pt needs her.  She is discussing with pt's friends a discharge plan. Social Work Anticipated Follow Up Needs: HH/OP;Support Group DC Planning Additional Notes/Comments: Pt will need 24 hour physical care and addressed with sister to plan for this.    Clinical Impression Pleasant female with many visitors in her room.  Discussed with them the importance for pt to get her rest during breaks and not overwhelm her.  Sister is somewhat anxious and hopefully once forms and papers completed she  Will calm down.  Will assist with formulating a safe discharge plan with sister and friends.  May benefit from Neuro-psych eval when appropriate due to all she has been through recently.  Lucy Chris 06/26/2012, 2:05 PM

## 2012-06-26 NOTE — Evaluation (Addendum)
Occupational Therapy Assessment and Plan  Patient Details  Name: Margaret Hess MRN: [id number] Date of Birth: [birth year]  OT Diagnosis: abnormal posture, apraxia, disturbance of vision, flaccid hemiplegia and hemiparesis, hemiplegia affecting non-dominant side and muscle weakness (generalized) Rehab Potential: Rehab Potential: Excellent ELOS: 3 weeks   Today's Date: 06/26/2012 Time: 0800-0905 Time Calculation (min): 65 min  Problem List:  Patient Active Problem List  Diagnosis  . Acute systolic CHF (congestive heart failure), NYHA class 4  . HTN (hypertension)  . Anxiety  . Moderate Bilateral pleural effusion  . Dyspnea on exertion  . LV (left ventricular) mural thrombus without MI  . Chronic passive hepatic congestion  . Acute respiratory failure with hypoxia  . Positive D dimer  . Abnormal coagulation profile  . Diabetes mellitus  . Leukocytosis  . Pulmonary hypertension  . Acute diastolic heart failure, NYHA class 3  . Acute combined systolic and diastolic heart failure  . Nonischemic cardiomyopathy  . Stroke, embolic  . Cardiomyopathy  . Chronic systolic heart failure  . CVA (cerebral infarction)    Past Medical History:  Past Medical History  Diagnosis Date  . Migraines   . Hypertension   . Diabetes mellitus    Past Surgical History: History reviewed. No pertinent past surgical history.  Assessment & Plan Clinical Impression: Patient is a 46 y.o. year old female with recent admission to the hospital on  06/15/2012 with dyspnea found to have nonischemic cardiomyopathy with ejection fraction of 17%. An echocardiogram is completed showing severe global hypokinesis with normal left ventricular cavity size and a left ventricular apical thrombus as well as grade 3 diastolic dysfunction. The patient had an MRI on 06/17/2012 which confirmed a small apical left ventricular thrombus. The patient was placed on Coumadin therapy and discharge from the hospital 06/19/2012 1  once INR was therapeutic. She was also given a prescription for 3 days of cross coverage with Lovenox 80 mg twice daily. On 06/19/2012 she developed acute onset of left-sided weakness and difficulty speaking. She was readmitted to the hospital 06/20/2012 with MRI of the brain showing acute right MCA infarct with cerebral edema, mild midline shift and hemorrhagic transformation. MRA of the head showed acute occlusion of the right M1 segment compatible with embolus causing right MCA infarction. Carotid Dopplers completed 06/21/2012 showing no significant ICA stenosis. Patient did not receive TPA with .  Patient transferred to CIR on 06/25/2012 .  Patient currently requires max with basic self-care skills secondary to muscle weakness, impaired timing and sequencing, abnormal tone, unbalanced muscle activation, motor apraxia, decreased coordination and decreased motor planning, field cut and decreased attention to left.  Prior to hospitalization, patient could complete ADLS and IADLS with independence.  Patient will benefit from skilled intervention to decrease level of assist with basic self-care skills, increase independence with basic self-care skills and increase level of independence with iADL prior to discharge home with care partner.  Anticipate patient will require 24 hour supervision and minimal physical assistance and follow up outpatient.  OT - End of Session Activity Tolerance: Tolerates 30+ min activity with multiple rests Endurance Deficit: No OT Assessment Rehab Potential: Excellent Barriers to Discharge: Decreased caregiver support Barriers to Discharge Comments: does not have 24 hour supervision OT Plan OT Frequency: 1-2 X/day, 60-90 minutes Estimated Length of Stay: 3 weeks OT Treatment/Interventions: Balance/vestibular training;Cognitive remediation/compensation;Community reintegration;Discharge planning;Pain management;Neuromuscular re-education;Functional mobility training;Functional  electrical stimulation;DME/adaptive equipment instruction;Patient/family education;Psychosocial support;Self Care/advanced ADL retraining;Splinting/orthotics;Therapeutic Activities;Visual/perceptual remediation/compensation;UE/LE Coordination activities;Therapeutic Exercise;UE/LE Strength taining/ROM OT Recommendation Follow Up  Recommendations: Outpatient OT;24 hour supervision/assistance Equipment Recommended: 3 in 1 bedside comode;Tub/shower bench  OT Evaluation Precautions/Restrictions  Precautions Precautions: Fall Precaution Comments: Left hemiparesis, expressive aphasia  Pain Pain Assessment Pain Assessment: No/denies pain   ADL  See FIM scale  Vision/Perception  Vision - History Baseline Vision: No visual deficits Vision - Assessment Eye Alignment: Within Functional Limits Vision Assessment: Vision tested Ocular Range of Motion: Within Functional Limits;Other (comment) (sifficulty with disassociation of head and eyes to the left) Tracking/Visual Pursuits: Able to track stimulus in all quads without difficulty Visual Fields: Left homonymous hemianopsia Perception Perception: Impaired Comments: Pt with visual agnosia, attempted to put the deodorant in her mouth initially.  Cognition Overall Cognitive Status: Impaired Arousal/Alertness: Awake/alert Orientation Level: Oriented to person;Oriented to place;Oriented to situation Attention: Sustained Focused Attention: Appears intact Sustained Attention: Appears intact Awareness: Impaired Problem Solving: Impaired Safety/Judgment: Impaired Comments: Pt with decreased awreness of the position of the LUE in sitting. Sensation Sensation Light Touch: Impaired Detail Light Touch Impaired Details: Absent LUE Stereognosis: Impaired Detail Stereognosis Impaired Details: Absent LUE Hot/Cold: Impaired Detail Hot/Cold Impaired Details: Absent RLE Proprioception: Impaired Detail Proprioception Impaired Details: Absent  LUE Coordination Gross Motor Movements are Fluid and Coordinated: No Fine Motor Movements are Fluid and Coordinated: No Coordination and Movement Description: Pt with no active movement of the LUE or hand. Motor  Motor Motor: Hemiplegia;Motor apraxia;Abnormal postural alignment and control Motor - Skilled Clinical Observations: Pt with dense left hemiparesis.  Sits with slight left trunk elongation and right side shortening. Mobility  Bed Mobility Bed Mobility: Supine to Sit Supine to Sit: 1: +1 Total assist;HOB flat Supine to Sit Details: Manual facilitation for placement;Manual facilitation for weight shifting Sitting - Scoot to Edge of Bed: 2: Max assist Sitting - Scoot to [place] of Bed Details: Manual facilitation for weight shifting;Manual facilitation for placement Transfers Sit to Stand: 2: Max assist;With upper extremity assist;From bed Sit to Stand Details: Manual facilitation for weight shifting  Trunk/Postural Assessment  Cervical Assessment Cervical Assessment: Within Functional Limits Thoracic Assessment Thoracic Assessment: Within Functional Limits Lumbar Assessment Lumbar Assessment: Within Functional Limits Postural Control Postural Control: Deficits on evaluation Protective Responses: Pt with frequent LOB to the left and at times posteriorly in sitting with very little reaction to selfcorrect.  Balance Balance Balance Assessed: Yes Static Sitting Balance Static Sitting - Balance Support: Right upper extremity supported;Feet supported Static Sitting - Level of Assistance: 4: Min assist Dynamic Sitting Balance Dynamic Sitting - Balance Support: No upper extremity supported Dynamic Sitting - Level of Assistance: 2: Max assist Static Standing Balance Static Standing - Level of Assistance: 2: Max assist Extremity/Trunk Assessment RUE Assessment RUE Assessment: Within Functional Limits LUE Assessment LUE Assessment: Exceptions to [facility] LUE Strength LUE Overall  Strength Comments: Pt currently Brunnstrum stage I in the left arm and hand.  PROM WFLS for all joints.  1/2 finger inferior subluxation present  See FIM for current functional status Refer to Care Plan for Long Term Goals  Recommendations for other services: None  Discharge Criteria: Patient will be discharged from OT if patient refuses treatment 3 consecutive times without medical reason, if treatment goals not met, if there is a change in medical status, if patient makes no progress towards goals or if patient is discharged from hospital.  The above assessment, treatment plan, treatment alternatives and goals were discussed and mutually agreed upon: by patient  During first OT session began education on selfcare retraining, LUE positioning, compensations for left visual field deficit,  and functional transfers/sit to stand.  Pt currently max assist for functional transfers and sit to stand.   Brooklyn Jeff OTR/L 06/26/2012, 9:30 AM

## 2012-06-26 NOTE — Progress Notes (Signed)
Speech Language Pathology Daily Session Note  Patient Details  Name: Deloris Moger MRN: [id number] Date of Birth: 01-06-1966  Today's Date: 06/26/2012 Time: 1530-1610 Time Calculation (min): 40 min  Short Term Goals: Week 1: SLP Short Term Goal 1 (Week 1): Patient will consume regular textures and thin liquids with intermittent supervision cues and no overt s/s of aspiration. SLP Short Term Goal 2 (Week 1): Patient will verbally complete automatic sequences with min assist phonemic cues. SLP Short Term Goal 3 (Week 1): Patient will utilize multi-modal communication strategies to express basic wants and needs with supervision assist. SLP Short Term Goal 4 (Week 1): Patient will verbally produce phrase level (3-4 word) utterances to describe situations, pictures, etc. with mod assist verbal and written cues.  SLP Short Term Goal 5 (Week 1): Patient will self monitor accuracy of verbal expression with mod assist verbal and non-verbal cues.   Skilled Therapeutic Interventions: Treatment session focused on addressing multi-modal communication and family education with sister.  SLP implemented written aids to assist with expression of wants and needs and patient requires supervision level assist to utilize.     FIM:  Comprehension Comprehension Mode: Auditory Comprehension: 5-Understands basic 90% of the time/requires cueing < 10% of the time Expression Expression Mode: Verbal Expression: 2-Expresses basic 25 - 49% of the time/requires cueing 50 - 75% of the time. Uses single words/gestures. Social Interaction Social Interaction: 4-Interacts appropriately 75 - 89% of the time - Needs redirection for appropriate language or to initiate interaction. Problem Solving Problem Solving: 4-Solves basic 75 - 89% of the time/requires cueing 10 - 24% of the time Memory Memory: 4-Recognizes or recalls 75 - 89% of the time/requires cueing 10 - 24% of the time FIM - Eating Eating Activity: 5: Set-up  assist for open containers;5: Supervision/cues  Pain Pain Assessment Pain Assessment: No/denies pain  Therapy/Group: Individual Therapy  Charlane Ferretti., CCC-SLP [phone]  Masashi Snowdon 06/26/2012, 5:40 PM

## 2012-06-26 NOTE — Care Management Note (Signed)
[facility] Individual Statement of Services  Patient Name:  Margaret Hess  Date:  06/26/2012  Welcome to the [facility].  Our goal is to provide you with an individualized program based on your diagnosis and situation, designed to meet your specific needs.  With this comprehensive rehabilitation program, you will be expected to participate in at least 3 hours of rehabilitation therapies Monday-Friday, with modified therapy programming on the weekends.  Your rehabilitation program will include the following services:  Physical Therapy (PT), Occupational Therapy (OT), Speech Therapy (ST), 24 hour per day rehabilitation nursing, Therapeutic Recreaction (TR), Neuropsychology, Case Management (RN and Social Worker), Rehabilitation Medicine, Nutrition Services and Pharmacy Services  Weekly team conferences will be held on Wednesday  to discuss your progress.  Your RN Case Designer, television/film set will talk with you frequently to get your input and to update you on team discussions.  Team conferences with you and your family in attendance may also be held.  Expected length of stay: 3 weeks Overall anticipated outcome: min/mod assist  Depending on your progress and recovery, your program may change.  Your RN Case Estate agent will coordinate services and will keep you informed of any changes.  Your RN Sports coach and SW names and contact numbers are listed  below.  The following services may also be recommended but are not provided by the [facility]:   Driving Evaluations  Home Health Rehabiltiation Services  Outpatient Rehabilitatation [facility]  Vocational Rehabilitation   Arrangements will be made to provide these services after discharge if needed.  Arrangements include referral to agencies that provide these services.  Your insurance has been verified to be:  DR. Laurann [place] Your primary doctor is:   [employer]  Pertinent information will be shared with your doctor and your insurance company.   Social Worker:  Dossie Der, [place] [phone]  Information discussed with and copy given to patient by: Lucy Chris, 06/26/2012, 10:41 AM

## 2012-06-26 NOTE — Progress Notes (Signed)
Physical Therapy Assessment and Plan  Patient Details  Name: Korissa Horsford MRN: [id number] Date of Birth: 1965-10-07  PT Diagnosis: Abnormal posture, Abnormality of gait, Coordination disorder, Difficulty walking, Hemiparesis non-dominant, Impaired sensation and Muscle weakness Rehab Potential: Good ELOS: 3 to 4 weeks   Today's Date: 06/26/2012 Time: 0900-1000 Time Calculation (min): 60 min  Problem List:  Patient Active Problem List  Diagnosis  . Acute systolic CHF (congestive heart failure), NYHA class 4  . HTN (hypertension)  . Anxiety  . Moderate Bilateral pleural effusion  . Dyspnea on exertion  . LV (left ventricular) mural thrombus without MI  . Chronic passive hepatic congestion  . Acute respiratory failure with hypoxia  . Positive D dimer  . Abnormal coagulation profile  . Diabetes mellitus  . Leukocytosis  . Pulmonary hypertension  . Acute diastolic heart failure, NYHA class 3  . Acute combined systolic and diastolic heart failure  . Nonischemic cardiomyopathy  . Stroke, embolic  . Cardiomyopathy  . Chronic systolic heart failure  . CVA (cerebral infarction)    Past Medical History:  Past Medical History  Diagnosis Date  . Migraines   . Hypertension   . Diabetes mellitus    Past Surgical History: History reviewed. No pertinent past surgical history.  Assessment & Plan Clinical Impression: Patient is a 46 y.o. right-handed female with history of migraine headaches, hypertension and diabetes mellitus with peripheral neuropathy and congestive heart failure. Patient initially presented 06/15/2012 with dyspnea found to have nonischemic cardiomyopathy with ejection fraction of 17%. An echocardiogram is completed showing severe global hypokinesis with normal left ventricular cavity size and a left ventricular apical thrombus as well as grade 3 diastolic dysfunction. The patient had an MRI on 06/17/2012 which confirmed a small apical left ventricular thrombus. The  patient was placed on Coumadin therapy and discharge from the hospital 06/19/2012 1 once INR was therapeutic. She was also given a prescription for 3 days of cross coverage with Lovenox 80 mg twice daily. On 06/19/2012 she developed acute onset of left-sided weakness and difficulty speaking. She was readmitted to the hospital 06/20/2012 with MRI of the brain showing acute right MCA infarct with cerebral edema, mild midline shift and hemorrhagic transformation. MRA of the head showed acute occlusion of the right M1 segment compatible with embolus causing right MCA infarction. Carotid Dopplers completed 06/21/2012 showing no significant ICA stenosis. Patient did not receive TPA. Followup neurology service as well as cardiology services advised to continue Coumadin therapy with latest INR 2.54. Noted admission INR of 2.15. She is presently on a dysphagia 3 thin liquid diet. Patient transferred to CIR on 06/25/2012 .   Patient currently requires total with mobility secondary to muscle weakness and muscle paralysis, impaired timing and sequencing, unbalanced muscle activation, motor apraxia, decreased coordination and decreased motor planning, field cut, decreased midline orientation, decreased attention to left, left side neglect and decreased motor planning, decreased initiation, decreased problem solving and delayed processing, and decreased sitting balance, decreased standing balance, decreased postural control, hemiplegia and decreased balance strategies.  Prior to hospitalization, patient was independent with mobility and lived with Alone (Friend will be staying with patient upon patient's return home) in a House with  Level entry.  Patient will benefit from skilled PT intervention to maximize safe functional mobility, minimize fall risk and decrease caregiver burden for planned discharge home with 24 hour assist.  Anticipate patient will benefit from follow up [facility] at discharge.  PT - End of Session Activity  Tolerance: Endurance does  not limit participation in activity Endurance Deficit: No PT Assessment Rehab Potential: Good Barriers to Discharge: Decreased caregiver support PT Plan PT Frequency: 5 out of 7 days Estimated Length of Stay: 3 to 4 weeks PT Treatment/Interventions: Ambulation/gait training;Balance/vestibular training;Community reintegration;DME/adaptive equipment instruction;Functional mobility training;Neuromuscular re-education;Patient/family education;Splinting/orthotics;Stair training;Therapeutic Activities;Therapeutic Exercise;UE/LE Strength taining/ROM;UE/LE Coordination activities;Wheelchair propulsion/positioning;Discharge planning PT Recommendation Follow Up Recommendations: Home health PT Equipment Recommended:  (TBD depending on progress)  PT Evaluation Precautions/Restrictions Precautions Precautions: Fall Precaution Comments: Left hemiparesis, expressive aphasia General @[id number] [phone]) Vital Signs Therapy Vitals Pulse Rate: 89  BP: 108/74 mmHg Oxygen Therapy SpO2: 96 % O2 Device: None (Room air) Pain Pain Assessment Pain Assessment: No/denies pain Home Living/Prior Functioning Home Living Lives With: Alone (Friend will be staying with patient) Available Help at Discharge: Friend(s);Family Type of Home: House Home Access: Level entry Home Layout: One level Bathroom Accessibility: Yes How Accessible: Accessible via walker Home Adaptive Equipment: None Prior Function Level of Independence: Independent with basic ADLs;Independent with homemaking with ambulation;Independent with gait;Independent with transfers Able to Take Stairs?: Yes Driving: Yes Vocation: Full time employment Vocation Requirements: English professor at [employer] Vision/Perception  Vision - History Baseline Vision: No visual deficits Vision - Assessment Eye Alignment: Within Functional Limits Vision Assessment: Vision tested Ocular Range of Motion: Within Functional  Limits;Other (comment) (sifficulty with disassociation of head and eyes to the left) Tracking/Visual Pursuits: Able to track stimulus in all quads without difficulty Visual Fields: Left homonymous hemianopsia Perception Perception: Impaired Comments: Pt with visual agnosia, attempted to put the deodorant in her mouth initially.  Cognition Overall Cognitive Status: Impaired Arousal/Alertness: Awake/alert Orientation Level: Oriented to person;Oriented to place;Oriented to situation Attention: Sustained Focused Attention: Appears intact Sustained Attention: Appears intact Awareness: Impaired Problem Solving: Impaired Behaviors: Impulsive Safety/Judgment: Impaired Comments: Pt with decreased awreness of the position of the LUE in sitting. Sensation Sensation Light Touch: Impaired by gross assessment Light Touch Impaired Details: Impaired LUE;Impaired LLE Stereognosis: Impaired Detail Stereognosis Impaired Details: Absent LUE Proprioception: Impaired Detail Proprioception Impaired Details: Absent LUE and LLE Coordination Gross Motor Movements are Fluid and Coordinated: No Fine Motor Movements are Fluid and Coordinated: No Coordination and Movement Description: Patient unable to move LUE and LLE Motor  Motor Motor: Hemiplegia;Motor apraxia;Abnormal postural alignment and control Motor - Skilled Clinical Observations: Patient presents with dense L hemiparesis  Mobility Bed Mobility Bed Mobility: Rolling Left;Rolling Right;Supine to Sit Rolling Right: 2: Max assist (with LUE and LLE) Rolling Right Details: Visual cues/gestures for sequencing;Verbal cues for precautions/safety;Verbal cues for sequencing;Manual facilitation for placement Rolling Left: 6: Modified independent (Device/Increase time) (Using bed rail) Supine to Sit: HOB flat;1: +2 Total assist Supine to Sit: Patient Percentage: 20% Supine to Sit Details: Manual facilitation for placement;Verbal cues for  precautions/safety;Verbal cues for technique;Verbal cues for sequencing;Manual facilitation for weight shifting Sitting - Scoot to Edge of Bed: 2: Max assist Sitting - Scoot to [place] of Bed Details: Manual facilitation for placement;Manual facilitation for weight shifting Transfers Sit to Stand - required two helpers (total assist)  Stand Pivot Transfers: -required two helpers (total assist) manual facilitation for weight shifting, safety Locomotion  Ambulation Ambulation: No (Attempted, Pt unable to initate stepping)  PT used stairs to facilitate automatic stepping response. Patient able to ascend two stairs with total assist of two helpers; descended backwards.  Trunk/Postural Assessment  Cervical Assessment Cervical Assessment: Within Functional Limits Thoracic Assessment Thoracic Assessment: Within Functional Limits Lumbar Assessment Lumbar Assessment: Within Functional Limits Postural Control Postural Control: Deficits on evaluation Protective Responses: Patient presents  with LOB to the left and posterior when performing dynamic sitting balance. Patient able to maintain static sitting balance fairly well.   Balance Balance Balance Assessed: Yes Static Sitting Balance Static Sitting - Balance Support: Right upper extremity supported;Feet supported Static Sitting - Level of Assistance: 4: Min assist Dynamic Sitting Balance Dynamic Sitting - Balance Support: No upper extremity supported Dynamic Sitting - Level of Assistance: 2: Max assist Static Standing Balance Static Standing - Level of Assistance: 2: Max assist Extremity Assessment  RUE Assessment RUE Assessment: Within Functional Limits LUE Assessment LUE Assessment: Exceptions to [facility] LUE Strength LUE Overall Strength Comments: Pt currently Brunnstrum stage I in the left arm and hand.  PROM WFLS for all joints.  1/2 finger inferior subluxation present RLE Assessment RLE Assessment: Within Functional Limits (Grossly 5/5 in  R hip flex, knee flex/ext, ankle DF) LLE Assessment LLE Assessment:  (0/5 in LLE by gross assessment)  See FIM for current functional status Refer to Care Plan for Long Term Goals  Recommendations for other services: None  Discharge Criteria: Patient will be discharged from PT if patient refuses treatment 3 consecutive times without medical reason, if treatment goals not met, if there is a change in medical status, if patient makes no progress towards goals or if patient is discharged from hospital.  The above assessment, treatment plan, treatment alternatives and goals were discussed and mutually agreed upon: by patient  Kjuan Seipp 06/26/2012, 10:31 AM

## 2012-06-26 NOTE — Progress Notes (Signed)
Patient information reviewed and entered into eRehab system by Jaymason Ledesma, RN, CRRN, PPS Coordinator.  Information including medical coding and functional independence measure will be reviewed and updated through discharge.    

## 2012-06-27 ENCOUNTER — Inpatient Hospital Stay (HOSPITAL_COMMUNITY): Payer: BC Managed Care – PPO | Admitting: Physical Therapy

## 2012-06-27 ENCOUNTER — Inpatient Hospital Stay (HOSPITAL_COMMUNITY): Payer: BC Managed Care – PPO

## 2012-06-27 ENCOUNTER — Inpatient Hospital Stay (HOSPITAL_COMMUNITY): Payer: BC Managed Care – PPO | Admitting: Speech Pathology

## 2012-06-27 LAB — PROTIME-INR
INR: 2.16 — ABNORMAL HIGH (ref 0.00–1.49)
Prothrombin Time: 23.2 seconds — ABNORMAL HIGH (ref 11.6–15.2)

## 2012-06-27 LAB — GLUCOSE, CAPILLARY: Glucose-Capillary: 177 mg/dL — ABNORMAL HIGH (ref 70–99)

## 2012-06-27 MED ORDER — MUSCLE RUB 10-15 % EX CREA
TOPICAL_CREAM | CUTANEOUS | Status: DC | PRN
  Administered 2012-06-28 – 2012-07-10 (×3): via TOPICAL
  Filled 2012-06-27: qty 85

## 2012-06-27 MED ORDER — WARFARIN SODIUM 5 MG PO TABS
5.0000 mg | ORAL_TABLET | Freq: Once | ORAL | Status: AC
  Administered 2012-06-27: 5 mg via ORAL
  Filled 2012-06-27: qty 1

## 2012-06-27 NOTE — Progress Notes (Signed)
Patient ID: Margaret Hess, female   DOB: 1965/11/18, 46 y.o.   MRN: [id number]  Subjective/Complaints: Y/N inconsistant, follows simple command, has R/L discrimination problem Apraxia noted L Side back pain Review of Systems  Unable to perform ROS: medical condition     Objective: Vital Signs: Blood pressure 118/75, pulse 88, temperature 98.4 F (36.9 C), temperature source Oral, resp. rate 18, height 5\' 6"  (1.676 m), weight 77.8 kg (171 lb 8.3 oz), SpO2 96.00%. No results found. Results for orders placed during the hospital encounter of 06/25/12 (from the past 72 hour(s))  GLUCOSE, CAPILLARY     Status: Abnormal   Collection Time   06/25/12  4:50 PM      Component Value Range Comment   Glucose-Capillary 232 (*) 70 - 99 mg/dL   CBC WITH DIFFERENTIAL     Status: Abnormal   Collection Time   06/26/12  6:25 AM      Component Value Range Comment   WBC 12.8 (*) 4.0 - 10.5 K/uL    RBC 4.31  3.87 - 5.11 MIL/uL    Hemoglobin 12.8  12.0 - 15.0 g/dL    HCT 57.8  46.9 - 62.9 %    MCV 90.0  78.0 - 100.0 fL    MCH 29.7  26.0 - 34.0 pg    MCHC 33.0  30.0 - 36.0 g/dL    RDW 52.8  41.3 - 24.4 %    Platelets 389  150 - 400 K/uL    Neutrophils Relative 57  43 - 77 %    Neutro Abs 7.3  1.7 - 7.7 K/uL    Lymphocytes Relative 31  12 - 46 %    Lymphs Abs 4.0  0.7 - 4.0 K/uL    Monocytes Relative 9  3 - 12 %    Monocytes Absolute 1.2 (*) 0.1 - 1.0 K/uL    Eosinophils Relative 2  0 - 5 %    Eosinophils Absolute 0.2  0.0 - 0.7 K/uL    Basophils Relative 0  0 - 1 %    Basophils Absolute 0.0  0.0 - 0.1 K/uL   COMPREHENSIVE METABOLIC PANEL     Status: Abnormal   Collection Time   06/26/12  6:25 AM      Component Value Range Comment   Sodium 135  135 - 145 mEq/L    Potassium 4.3  3.5 - 5.1 mEq/L    Chloride 101  96 - 112 mEq/L    CO2 23  19 - 32 mEq/L    Glucose, Bld 167 (*) 70 - 99 mg/dL    BUN 15  6 - 23 mg/dL    Creatinine, Ser 0.10  0.50 - 1.10 mg/dL    Calcium 8.9  8.4 - 27.2 mg/dL      Total Protein 7.0  6.0 - 8.3 g/dL    Albumin 2.6 (*) 3.5 - 5.2 g/dL    AST 25  0 - 37 U/L    ALT 34  0 - 35 U/L    Alkaline Phosphatase 49  39 - 117 U/L    Total Bilirubin 0.3  0.3 - 1.2 mg/dL    GFR calc non Af Amer >90  >90 mL/min    GFR calc Af Amer >90  >90 mL/min   PROTIME-INR     Status: Abnormal   Collection Time   06/26/12  6:25 AM      Component Value Range Comment   Prothrombin Time 21.4 (*) 11.6 - 15.2 seconds  INR 1.94 (*) 0.00 - 1.49   GLUCOSE, CAPILLARY     Status: Abnormal   Collection Time   06/26/12  7:14 AM      Component Value Range Comment   Glucose-Capillary 157 (*) 70 - 99 mg/dL   GLUCOSE, CAPILLARY     Status: Abnormal   Collection Time   06/26/12 11:40 AM      Component Value Range Comment   Glucose-Capillary 288 (*) 70 - 99 mg/dL    Comment 1 Documented in Chart      Comment 2 Notify RN     GLUCOSE, CAPILLARY     Status: Abnormal   Collection Time   06/26/12  5:02 PM      Component Value Range Comment   Glucose-Capillary 189 (*) 70 - 99 mg/dL   GLUCOSE, CAPILLARY     Status: Abnormal   Collection Time   06/26/12  9:11 PM      Component Value Range Comment   Glucose-Capillary 214 (*) 70 - 99 mg/dL   PROTIME-INR     Status: Abnormal   Collection Time   06/27/12  6:45 AM      Component Value Range Comment   Prothrombin Time 23.2 (*) 11.6 - 15.2 seconds    INR 2.16 (*) 0.00 - 1.49   GLUCOSE, CAPILLARY     Status: Abnormal   Collection Time   06/27/12  7:47 AM      Component Value Range Comment   Glucose-Capillary 177 (*) 70 - 99 mg/dL    Comment 1 Notify RN        HEENT: normal Cardio: RRR Resp: CTA B/L GI: BS positive Extremity:  No Edema Skin:   Intact Neuro: Abnormal Sensory absent light touch and proprio, positive deep pain, Abnormal Motor 0/5 in LUE and LLE, Abnormal FMC Tone  reduced on L side, Tone:  Hypotonia, Aphasic, Inattention and Apraxic Musc/Skel:  Swelling L dorsum hand ecchymosis and swelling-mild Tenderness L side of  back  Assessment/Plan: 1. Functional deficits secondary to R MCA infarct L HP, crossed aphasia,apraxia  which require 3+ hours per day of interdisciplinary therapy in a comprehensive inpatient rehab setting. Physiatrist is providing close team supervision and 24 hour management of active medical problems listed below. Physiatrist and rehab team continue to assess barriers to discharge/monitor patient progress toward functional and medical goals. FIM: FIM - Bathing Bathing Steps Patient Completed: Chest;Left Arm;Abdomen;Right upper leg;Left upper leg Bathing: 3: Mod-Patient completes 5-7 30f 10 parts or 50-74%  FIM - Upper Body Dressing/Undressing Upper body dressing/undressing steps patient completed: Put head through opening of pull over shirt/dress;Pull shirt over trunk Upper body dressing/undressing: 2: Max-Patient completed 25-49% of tasks FIM - Lower Body Dressing/Undressing Lower body dressing/undressing: 1: Total-Patient completed less than 25% of tasks        FIM - Bed/Chair Transfer Bed/Chair Transfer: 2: Supine > Sit: Max A (lifting assist/Pt. 25-49%);1: Chair or W/C > Bed: Total A (helper does all/Pt. < 25%);1: Bed > Chair or W/C: Total A (helper does all/Pt. < 25%);1: Two helpers  FIM - Locomotion: Wheelchair Locomotion: Wheelchair: 1: Total Assistance/staff pushes wheelchair (Pt<25%) FIM - Locomotion: Ambulation Locomotion: Ambulation: 0: Activity did not occur (Attempted)  Comprehension Comprehension Mode: Auditory Comprehension: 5-Understands basic 90% of the time/requires cueing < 10% of the time  Expression Expression Mode: Verbal Expression: 2-Expresses basic 25 - 49% of the time/requires cueing 50 - 75% of the time. Uses single words/gestures.  Social Interaction Social Interaction: 4-Interacts appropriately 75 -  89% of the time - Needs redirection for appropriate language or to initiate interaction.  Problem Solving Problem Solving: 4-Solves basic 75 - 89%  of the time/requires cueing 10 - 24% of the time  Memory Memory: 4-Recognizes or recalls 75 - 89% of the time/requires cueing 10 - 24% of the time  Medical Problem List and Plan:  1. 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 DVT Prophylaxis/Anticoagulation: Chronic Coumadin therapy with a goal INR of 2-2.5 secondary hemorrhagic transformation  3. Neuropsych: This patient is not capable of making decisions on his/her own behalf.  4. 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 Lisinopril 2.5 mg twice a day, Lanoxin 0.125 mg daily. Monitor with increased mobility. Cardiology to follow up as needed(Dr. Bensimhon)  5. Diabetes mellitus. Patient currently on sliding scale insulin. Patient on Glucophage 500 mg twice a day prior to admission and victoza 1.2 mL subcutaneously daily. Check blood sugars a.c. and at bedtime resume oral agents as tolerated  6. Mood/depression. Patient on Wellbutrin prior to admission 75 mg daily. We'll discuss resuming this medication. Provide emotional support and positive reinforcement  7. Sensory dysesthesia related to parietal infarct and hemorrhagic transformation, will trial gabapentin 8.  Back pain mild/mod will use K pad and analgesic balm   LOS (Days) 2 A FACE TO FACE EVALUATION WAS PERFORMED  Emmani Lesueur E 06/27/2012, 9:46 AM

## 2012-06-27 NOTE — Progress Notes (Signed)
Speech Language Pathology Daily Session Note  Patient Details  Name: Margaret Hess MRN: [id number] Date of Birth: [birth year]  Today's Date: 06/27/2012 Time: 1130-1200 Time Calculation (min): 30 min  Short Term Goals: Week 1: SLP Short Term Goal 1 (Week 1): Patient will consume regular textures and thin liquids with intermittent supervision cues and no overt s/s of aspiration. SLP Short Term Goal 2 (Week 1): Patient will verbally complete automatic sequences with min assist phonemic cues. SLP Short Term Goal 3 (Week 1): Patient will utilize multi-modal communication strategies to express basic wants and needs with supervision assist. SLP Short Term Goal 4 (Week 1): Patient will verbally produce phrase level (3-4 word) utterances to describe situations, pictures, etc. with mod assist verbal and written cues.  SLP Short Term Goal 5 (Week 1): Patient will self monitor accuracy of verbal expression with mod assist verbal and non-verbal cues.   Skilled Therapeutic Interventions: Treatment session focused on addressing speech-language goals.  SLP facilitated session with use of multi-modal communication and supervision level verbal cues.  SLP also facilitated session with mod faded to min assist verbal, visual and articulatory cues to verbally produce 3-4 word utterances with written and picture present.  Patient demonstrated increased self-monitoring and correcting of verbal errors, left to right scanning strategies.     FIM:  Comprehension Comprehension Mode: Auditory Comprehension: 4-Understands basic 75 - 89% of the time/requires cueing 10 - 24% of the time Expression Expression Mode: Verbal Expression: 3-Expresses basic 50 - 74% of the time/requires cueing 25 - 50% of the time. Needs to repeat parts of sentences. Social Interaction Social Interaction: 5-Interacts appropriately 90% of the time - Needs monitoring or encouragement for participation or interaction. Problem Solving Problem  Solving: 4-Solves basic 75 - 89% of the time/requires cueing 10 - 24% of the time Memory Memory: 4-Recognizes or recalls 75 - 89% of the time/requires cueing 10 - 24% of the time  Pain Pain Assessment Pain Assessment: No/denies pain  Therapy/Group: Individual Therapy  Charlane Ferretti., CCC-SLP [phone]  [name] 06/27/2012, 1:05 PM

## 2012-06-27 NOTE — Progress Notes (Signed)
SUBJECTIVE:  Denies SOB.  Still with hemiparesis of left side  OBJECTIVE:   Vitals:   Filed Vitals:   06/26/12 1605 06/26/12 1817 06/27/12 0500 06/27/12 0959  BP: 118/78 118/78 118/75   Pulse: 82 82 88 96  Temp: 97.1 F (36.2 C)  98.4 F (36.9 C)   TempSrc: Oral  Oral   Resp: 17  18   Height:      Weight:   77.8 kg (171 lb 8.3 oz)   SpO2: 98%  96%    I&O's:   Intake/Output Summary (Last 24 hours) at 06/27/12 1157 Last data filed at 06/27/12 0940  Gross per 24 hour  Intake   1080 ml  Output      8 ml  Net   1072 ml   TELEMETRY: Reviewed telemetry pt in NSR:     PHYSICAL EXAM General: Well developed, well nourished, in no acute distress  Lungs:   Clear bilaterally to auscultation and percussion. Heart:   HRRR S1 S2 Pulses are 2+ & equal. Abdomen: Bowel sounds are positive, abdomen soft and non-tender without masses Extremities:   No clubbing, cyanosis or edema.  DP +1 Neuro: Alert and oriented X 3.    LABS: Basic Metabolic Panel:  Basename 06/26/12 0625 06/25/12 0635  NA 135 137  K 4.3 4.3  CL 101 102  CO2 23 23  GLUCOSE 167* 167*  BUN 15 17  CREATININE 0.54 0.57  CALCIUM 8.9 8.8  MG -- --  PHOS -- --   Liver Function Tests:  [facility] 06/26/12 0625  AST 25  ALT 34  ALKPHOS 49  BILITOT 0.3  PROT 7.0  ALBUMIN 2.6*   No results found for this basename: LIPASE:2,AMYLASE:2 in the last 72 hours CBC:  Basename 06/26/12 0625  WBC 12.8*  NEUTROABS 7.3  HGB 12.8  HCT 38.8  MCV 90.0  PLT 389   Coag Panel:   Lab Results  Component Value Date   INR 2.16* 06/27/2012   INR 1.94* 06/26/2012   INR 1.66* 06/25/2012    RADIOLOGY: Ct Head Wo Contrast  06/23/2012  *RADIOLOGY REPORT*  Clinical Data: Stroke  CT HEAD WITHOUT CONTRAST  Technique:  Contiguous axial images were obtained from the base of the skull through the vertex without contrast.  Comparison: MRI 06/21/2012  Findings: Acute infarct in the right MCA territory again noted. This involves the    right insular cortex and approximately half of the right parietal cortex.  There is a hemorrhagic component in the right putamen.  The area of hemorrhage is unchanged measuring approximately 32 x 11 mm extending into the right caudate.  There is mass effect and swelling of the infarct with mild midline shift measuring approximately 4 mm.  There is increasing mass effect on the right lateral ventricle compared to the prior study.  No new areas of infarction or hemorrhage are identified.  No underlying mass lesion is seen.  IMPRESSION: Acute hemorrhagic infarction on the right  with  similar distribution to the prior MRI.  There is increased edema and mass effect compared with the recent MRI.   Original Report Authenticated By: Camelia Phenes, M.D.    Ct Head Wo Contrast  06/20/2012  *RADIOLOGY REPORT*  Clinical Data: Increased aphasia and worsening left-sided weakness.  CT HEAD WITHOUT CONTRAST  Technique:  Contiguous axial images were obtained from the base of the skull through the vertex without contrast.  Comparison: 06/19/2012 at 2329 hours  Findings: The ventricles and sulci are  symmetrical without significant effacement, displacement, or dilatation. No mass effect or midline shift. No abnormal extra-axial fluid collections. The grey-white matter junction is distinct. Basal cisterns are not effaced. No acute intracranial hemorrhage. No depressed skull fractures.  No significant change since previous study.  IMPRESSION: No acute intracranial abnormalities.  Stable appearance since previous study.   Original Report Authenticated By: Marlon Pel, M.D.    Ct Head Wo Contrast  06/19/2012  *RADIOLOGY REPORT*  Clinical Data: Code stroke, left-sided weakness, aphasia  CT HEAD WITHOUT CONTRAST  Technique:  Contiguous axial images were obtained from the base of the skull through the vertex without contrast.  Comparison: None.  Findings: No evidence of parenchymal hemorrhage or extra-axial fluid collection.  No mass lesion, mass effect, or midline shift.  No CT evidence of acute infarction.  Cerebral volume is age appropriate.  No ventriculomegaly.  The visualized paranasal sinuses are essentially clear. The mastoid air cells are unopacified.  No evidence of calvarial fracture.  IMPRESSION: No evidence of acute intracranial abnormality.  These results were called by telephone on 06/19/2012 at 1143 hours to Dr. Fonnie Jarvis, who verbally acknowledged these results.   Original Report Authenticated By: Charline Bills, M.D.    Ct Angio Chest Pe W/cm &/or Wo Cm  06/15/2012  *RADIOLOGY REPORT*  Clinical Data: Cardia.  Dyspnea.  Elevated D-dimer.  CT ANGIOGRAPHY CHEST  Technique:  Multidetector CT imaging of the chest using the standard protocol during bolus administration of intravenous contrast. Multiplanar reconstructed images including MIPs were obtained and reviewed to evaluate the vascular anatomy.  Contrast: 80mL OMNIPAQUE IOHEXOL 350 MG/ML SOLN  Comparison: None.  Findings: There are no pulmonary emboli.  There is slight cardiomegaly.  No pulmonary edema. The lungs are clear.  There are moderate bilateral pleural effusions.  No osseous abnormality.  No visible coronary artery calcifications.  No significant adenopathy.  IMPRESSION: Slight cardiomegaly with moderate bilateral pleural effusions.  No pulmonary emboli.   Original Report Authenticated By: Gwynn Burly, M.D.    Mr [facility] Wo Contrast  06/21/2012  *RADIOLOGY REPORT*  Clinical Data:  Left-sided weakness.  Stroke.  MRI HEAD WITHOUT CONTRAST MRA HEAD WITHOUT CONTRAST  Technique:  Multiplanar, multiecho pulse sequences of the brain and surrounding structures were obtained without intravenous contrast. Angiographic images of the head were obtained using MRA technique without contrast.  Comparison:  CT 06/20/2012  MRI HEAD  Findings:  Acute right MCA infarct.  Acute infarct involves the posterior internal capsule on the right, right putamen, right caudate,  and  there is involvement of much of the insular cortex with extension into the right parietal cortex.  Small amount of infarct in the right frontal lobe.  There is hemorrhage in the right temporal lobe and in the right putamen.  Mild mass effect and midline shift of approximately 3 mm.  No acute infarct on the left.  Ventricle size is normal.  Brainstem and cerebellum are normal.  IMPRESSION: Acute hemorrhagic infarct in the right middle cerebral artery territory.  There is mass effect and mild midline shift of approximately 3 mm.  MRA HEAD  Findings: Both vertebral arteries are patent to the basilar.  The basilar is patent.  Superior cerebellar and posterior cerebral arteries are patent bilaterally.  Internal carotid artery is patent bilaterally.  Mild stenosis or hypoplasia of the left A1 segment.  Both anterior cerebral arteries are primarily supplied from the right A1 segment.  The left middle cerebral artery is normal.  There is abrupt  occlusion of the right M1 segment compatible with an embolic infarct.  IMPRESSION: Acute occlusion of the right M1 segment compatible with an embolus causing right MCA infarction.   Original Report Authenticated By: Camelia Phenes, M.D.    Mr Brain Wo Contrast  06/21/2012  *RADIOLOGY REPORT*  Clinical Data:  Left-sided weakness.  Stroke.  MRI HEAD WITHOUT CONTRAST MRA HEAD WITHOUT CONTRAST  Technique:  Multiplanar, multiecho pulse sequences of the brain and surrounding structures were obtained without intravenous contrast. Angiographic images of the head were obtained using MRA technique without contrast.  Comparison:  CT 06/20/2012  MRI HEAD  Findings:  Acute right MCA infarct.  Acute infarct involves the posterior internal capsule on the right, right putamen, right caudate, and  there is involvement of much of the insular cortex with extension into the right parietal cortex.  Small amount of infarct in the right frontal lobe.  There is hemorrhage in the right temporal lobe  and in the right putamen.  Mild mass effect and midline shift of approximately 3 mm.  No acute infarct on the left.  Ventricle size is normal.  Brainstem and cerebellum are normal.  IMPRESSION: Acute hemorrhagic infarct in the right middle cerebral artery territory.  There is mass effect and mild midline shift of approximately 3 mm.  MRA HEAD  Findings: Both vertebral arteries are patent to the basilar.  The basilar is patent.  Superior cerebellar and posterior cerebral arteries are patent bilaterally.  Internal carotid artery is patent bilaterally.  Mild stenosis or hypoplasia of the left A1 segment.  Both anterior cerebral arteries are primarily supplied from the right A1 segment.  The left middle cerebral artery is normal.  There is abrupt occlusion of the right M1 segment compatible with an embolic infarct.  IMPRESSION: Acute occlusion of the right M1 segment compatible with an embolus causing right MCA infarction.   Original Report Authenticated By: Camelia Phenes, M.D.    Dg Chest Port 1 View  06/21/2012  *RADIOLOGY REPORT*  Clinical Data: Respiratory distress  PORTABLE CHEST - 1 VIEW  Comparison: 06/20/2012  Findings: Heart size is normal.  No pleural effusion or edema.  The lung volumes are low.  No airspace opacity.  Right subclavian central venous catheter is noted with tip in the right atrium.  IMPRESSION:  1.  No acute cardiopulmonary abnormalities noted. 2.  Low lung volumes.   Original Report Authenticated By: Rosealee Albee, M.D.    Dg Chest Port 1 View  06/20/2012  *RADIOLOGY REPORT*  Clinical Data: Right-sided CV L.  PORTABLE CHEST - 1 VIEW  Comparison: CT chest 06/15/2012  Findings: Right central venous catheter with tip over the low SVC region, allowing for patient rotation.  No pneumothorax.  Elevation of right hemidiaphragm.  Borderline heart size with normal pulmonary vascularity.  No focal airspace consolidation in the lungs.  No blunting of costophrenic angles.  IMPRESSION: Right  central venous catheter with tip in the low SVC region.  No pneumothorax.   Original Report Authenticated By: Marlon Pel, M.D.    Mr Card Morphology Wo/w Cm  06/17/2012  *RADIOLOGY REPORT*  Clinical Data: Cardiomyopathy, evaluate for myocarditis, infiltrative disease.  MR CARDIA MORPHOLOGY WITHOUT AND WITH CONTRAST  GE 1.5 T magnet with dedicated cardiac coil.  FIESTA sequences for function and morphology.  10 minutes after 25 mL Multihance contrast was injected, inversion recovery sequences were done to assess for myocardial delayed enhancement. EF was calculated at a dedicated workstation.  Contrast:  25mL MULTIHANCE GADOBENATE DIMEGLUMINE 529 MG/ML IV SOLN  Comparison: None.  Findings: Mildly dilated left ventricle with severe systolic dysfunction, EF 20%. Global hypokinesis.  There was prominent trabeculation predominantly along the anterior wall, with diastolic noncompacted/compacted ratio = 2.  There was a small thrombus along the apical anterior wall. There was mild to moderate left atrial enlargement.  Normal right ventricular size with mildly decreased systolic function.  Mild right atrial enlargement.  There was moderate tricuspid regurgitation and probably mild mitral regurgitation.  No aortic insufficiency or stenosis.  On delayed enhancement imaging, there was no myocardial delayed enhancement.  Measurements:  LV EDV 192 mL  LV SV 39 mL  LV EF 20.4%  IMPRESSION: 1. Mildly dilated LV with severe systolic dysfunction, EF 20%, with global hypokinesis.  2. Small LV thrombus along the apical anterior wall.  3. 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  However, other walls do not show as prominent trabeculation.  The noncompacted/compacted ratio in diastole along the anterior wall is of borderline significance (2).  4. Normal RV size with mildly decreased systolic function.  5. No myocardial delayed enhancement.  Therefore, no definitive evidence for prior MI,  myocarditis or infiltrative disease.   Original Report Authenticated By: [email or web address]     Assessment:   1. CVA, acute hemorrhagic R MCA  --c/b expressive aphasia and L-sided hemiparesis  2. 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 NICM, EF <20%  4. LV thrombus  5. DM  6. Hypertension  Plan/Discussion:   Tolerating increased dose of Lisinopril with stable renal function Coumadin management per Neuro. Pharmacy to dose.     Quintella Reichert, MD  06/27/2012  11:57 AM

## 2012-06-27 NOTE — Progress Notes (Signed)
ANTICOAGULATION CONSULT NOTE - Follow Up Consult  Pharmacy Consult for Coumadin Indication: LV thrombus  No Known Allergies  Patient Measurements: Height: 5\' 6"  (167.6 cm) Weight: 171 lb 8.3 oz (77.8 kg) IBW/kg (Calculated) : 59.3   Vital Signs: Temp: 98.4 F (36.9 C) (10/26 0500) Temp src: Oral (10/26 0500) BP: 118/75 mmHg (10/26 0500) Pulse Rate: 88  (10/26 0500)  Labs:  Basename 06/27/12 0645 06/26/12 0625 06/25/12 0635  HGB -- 12.8 --  HCT -- 38.8 --  PLT -- 389 --  APTT -- -- --  LABPROT 23.2* 21.4* 19.1*  INR 2.16* 1.94* 1.66*  HEPARINUNFRC -- -- --  CREATININE -- 0.54 0.57  CKTOTAL -- -- --  CKMB -- -- --  TROPONINI -- -- --    Estimated Creatinine Clearance: 92.5 ml/min (by C-G formula based on Cr of 0.54).  Assessment: 45yof discharged from [facility] on 10/18 on coumadin 5mg  daily and lovenox 80mg  q12 for new LV thrombus. INR 2.15 at time of discharge after only 2 doses of coumadin. Returns to the hospital that same night with difficulty speaking and L sided weakness. 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   Coumadin continued (ok by neuro) at home dose of 5mg  daily. INR is therapeutic today. Will continue to titrate warfarin conservatively given recent stroke.  Goal of Therapy:  INR 2-3 Monitor platelets by anticoagulation protocol: Yes   Plan:  1) Repeat Coumadin 5mg  x 1 2) Follow up INR in AM   [place], Garth Bigness 06/27/2012,9:56 AM

## 2012-06-27 NOTE — Progress Notes (Signed)
Occupational Therapy Session Note  Patient Details  Name: Margaret Hess MRN: [id number] Date of Birth: 23-Nov-1965  Today's Date: 06/27/2012 Time: 0900-1000 Time Calculation (min): 60 min  Short Term Goals: Week 1:  OT Short Term Goal 1 (Week 1): Pt will maintain dynamic sitting balance with mod assist during selfcare activities. OT Short Term Goal 2 (Week 1): Pt will position the LUE prior and after transitional movements with mod questioning cues. OT Short Term Goal 3 (Week 1): Pt will perform all bathing with mod assist sit to stand. OT Short Term Goal 4 (Week 1): Pt will perform UB dressing with min assist to donn pullover shirt. OT Short Term Goal 5 (Week 1): Pt will perform toilet transfers with mod assist to 3:1.  Skilled Therapeutic Interventions: ADL-retraining at walk-in shower with emphasis on transfers, dynamic sitting balance, effective use of DME, sequencing, and attention to left.  Patient requires close supervision due to impulsivity and impaired dynamic sitting balance, particularly when reaching below her knees, bilaterally.   Educated on lower body cleansing and dressing by crossing over legs.  Static sitting balance is good.  Patient requires verbal cues and manual assist for placement of legs in prep for transfers and to attend to left UE at all times.  Therapy Documentation Precautions:  Precautions Precautions: Fall Precaution Comments: Left hemiparesis, expressive aphasia  Pain: Pain Assessment Pain Assessment: No/denies pain Faces Pain Scale: Hurts little more Pain Type: Neuropathic pain Pain Location: Back Pain Orientation: Left;Lower Pain Descriptors: Discomfort Pain Intervention(s): MD notified (Comment);Repositioned;Distraction  See FIM for current functional status  Therapy/Group: Individual Therapy  Second session: Time: 1330-1400 Time Calculation (min):  30 min  Pain Assessment: No report of pain  Skilled Therapeutic Interventions:  ADL-retraining with emphasis on bed mobility, bed <> w/c transfers, dynamic sitting balance, neuromuscular re-ed, hemiplegic dressing skills (lower body).  Patient was encountered while in bed with soiled diaper.   Patient accepted encouragement to attend to perineal cleansing and was able to complete hip-bridging with OT assist to block left hip and manual facilitation to lift pelvis.  Patient completed stand-pivot transfer to w/c with steadying and lift/lower assist.   See FIM for current functional status  Therapy/Group: Individual Therapy   Georgeanne Nim 06/27/2012, 12:37 PM

## 2012-06-27 NOTE — Progress Notes (Signed)
Physical Therapy Note  Patient Details  Name: Margaret Hess MRN: [id number] Date of Birth: Jan 02, 1966 Today's Date: 06/27/2012  1445-1540 (55 minutes) individual Pain: no reported pain Focus of treatment: wc mobility training, transfer training, Neuro re-ed LT LE, pregait (standing) activities, sitting balance activities Treatment: wc mobility- 80 feet using LT extremities SBA; transfers - scoot to squat/pivot mod assist; sit to supine mod assist with decreased trunk control to left; sidelying gravity eliminated LT hip flexion AA; supine bilateral hip flexion/extension using therapy ball, partial bridging; sitting edge of bed reaching to left to improve trunk control with loss of balance outside BOS; sit to stand in parallel bars mod assist blocking left knee while stepping forward and back with right foot (mod assist). No active left quad activity noted.   [name],JIM 06/27/2012, 8:04 AM

## 2012-06-28 ENCOUNTER — Inpatient Hospital Stay (HOSPITAL_COMMUNITY): Payer: BC Managed Care – PPO | Admitting: *Deleted

## 2012-06-28 LAB — GLUCOSE, CAPILLARY
Glucose-Capillary: 182 mg/dL — ABNORMAL HIGH (ref 70–99)
Glucose-Capillary: 222 mg/dL — ABNORMAL HIGH (ref 70–99)
Glucose-Capillary: 226 mg/dL — ABNORMAL HIGH (ref 70–99)

## 2012-06-28 LAB — PROTIME-INR: Prothrombin Time: 23.9 seconds — ABNORMAL HIGH (ref 11.6–15.2)

## 2012-06-28 MED ORDER — WARFARIN SODIUM 5 MG PO TABS
5.0000 mg | ORAL_TABLET | Freq: Once | ORAL | Status: AC
  Administered 2012-06-28: 5 mg via ORAL
  Filled 2012-06-28: qty 1

## 2012-06-28 MED ORDER — METFORMIN HCL 500 MG PO TABS
500.0000 mg | ORAL_TABLET | Freq: Two times a day (BID) | ORAL | Status: DC
  Administered 2012-06-28: 500 mg via ORAL
  Filled 2012-06-28 (×4): qty 1

## 2012-06-28 NOTE — Progress Notes (Signed)
Occupational Therapy Note  Patient Details  Name: Margaret Hess MRN: [id number] Date of Birth: 1965-10-26 Today's Date: 06/28/2012  Time:  1600-1645  (45 min)  Pain:  None Individual session  Engaged in PNF patterns supine with emphasis on the hips and scapula.  Practice rolling with resistance in all directions.  Pt weaker in shoulder/scapula region than hips.  Supine to sit was max assist.  Sat EOB for balance activities, LUE neuromuscular re education.  Transferred bed to wc with max assist; wc to toilet using grab bar with max assist.  Pt was total assist with donning/doffing pants but able to perform peri area after BM.  Performed hand washing at sink with guiding LUE.  Pt. Left in wc with controls in reach.       [name], [name] 06/28/2012, 4:05 PM

## 2012-06-28 NOTE — Progress Notes (Signed)
ANTICOAGULATION CONSULT NOTE - Follow Up Consult  Pharmacy Consult for Coumadin Indication: LV thrombus  No Known Allergies  Patient Measurements: Height: 5\' 6"  (167.6 cm) Weight: 169 lb 8.5 oz (76.9 kg) IBW/kg (Calculated) : 59.3   Vital Signs: Temp: 97.8 F (36.6 C) (10/27 0500) Temp src: Oral (10/27 0500) BP: 104/68 mmHg (10/27 0750) Pulse Rate: 76  (10/27 0750)  Labs:  Alvira Philips 06/28/12 0708 06/27/12 0645 06/26/12 0625  HGB -- -- 12.8  HCT -- -- 38.8  PLT -- -- 389  APTT -- -- --  LABPROT 23.9* 23.2* 21.4*  INR 2.25* 2.16* 1.94*  HEPARINUNFRC -- -- --  CREATININE -- -- 0.54  CKTOTAL -- -- --  CKMB -- -- --  TROPONINI -- -- --    Estimated Creatinine Clearance: 92 ml/min (by C-G formula based on Cr of 0.54).  Assessment: 45yof discharged from [facility] on 10/18 on coumadin 5mg  daily and lovenox 80mg  q12 for new LV thrombus. INR 2.15 at time of discharge after only 2 doses of coumadin. Returns to the hospital that same night with difficulty speaking and L sided weakness. 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   Coumadin continued (ok by neuro) at home dose of 5mg  daily. INR is therapeutic today. Will continue to titrate warfarin conservatively given recent stroke.  Goal of Therapy:  INR 2-2.5 (lower goal secondary to hemorrhagic transformation of CVA)    Plan:  1) Repeat Coumadin 5mg  x 1 2) Follow up INR in AM   Notnamed Scholz, Garth Bigness 06/28/2012,12:39 PM

## 2012-06-28 NOTE — Progress Notes (Signed)
Patient ID: Margaret Hess, female   DOB: September 09, 1965, 46 y.o.   MRN: [id number]  Subjective/Complaints: Y/N inconsistant, follows simple command, has R/L discrimination problem Apraxia noted L Side back pain improving with ben gay Review of Systems  Unable to perform ROS: medical condition     Objective: Vital Signs: Blood pressure 104/68, pulse 76, temperature 97.8 F (36.6 C), temperature source Oral, resp. rate 16, height 5\' 6"  (1.676 m), weight 76.9 kg (169 lb 8.5 oz), SpO2 97.00%. No results found. Results for orders placed during the hospital encounter of 06/25/12 (from the past 72 hour(s))  GLUCOSE, CAPILLARY     Status: Abnormal   Collection Time   06/25/12  4:50 PM      Component Value Range Comment   Glucose-Capillary 232 (*) 70 - 99 mg/dL   CBC WITH DIFFERENTIAL     Status: Abnormal   Collection Time   06/26/12  6:25 AM      Component Value Range Comment   WBC 12.8 (*) 4.0 - 10.5 K/uL    RBC 4.31  3.87 - 5.11 MIL/uL    Hemoglobin 12.8  12.0 - 15.0 g/dL    HCT 64.3  32.9 - 51.8 %    MCV 90.0  78.0 - 100.0 fL    MCH 29.7  26.0 - 34.0 pg    MCHC 33.0  30.0 - 36.0 g/dL    RDW 84.1  66.0 - 63.0 %    Platelets 389  150 - 400 K/uL    Neutrophils Relative 57  43 - 77 %    Neutro Abs 7.3  1.7 - 7.7 K/uL    Lymphocytes Relative 31  12 - 46 %    Lymphs Abs 4.0  0.7 - 4.0 K/uL    Monocytes Relative 9  3 - 12 %    Monocytes Absolute 1.2 (*) 0.1 - 1.0 K/uL    Eosinophils Relative 2  0 - 5 %    Eosinophils Absolute 0.2  0.0 - 0.7 K/uL    Basophils Relative 0  0 - 1 %    Basophils Absolute 0.0  0.0 - 0.1 K/uL   COMPREHENSIVE METABOLIC PANEL     Status: Abnormal   Collection Time   06/26/12  6:25 AM      Component Value Range Comment   Sodium 135  135 - 145 mEq/L    Potassium 4.3  3.5 - 5.1 mEq/L    Chloride 101  96 - 112 mEq/L    CO2 23  19 - 32 mEq/L    Glucose, Bld 167 (*) 70 - 99 mg/dL    BUN 15  6 - 23 mg/dL    Creatinine, Ser 1.60  0.50 - 1.10 mg/dL    Calcium  8.9  8.4 - 10.5 mg/dL    Total Protein 7.0  6.0 - 8.3 g/dL    Albumin 2.6 (*) 3.5 - 5.2 g/dL    AST 25  0 - 37 U/L    ALT 34  0 - 35 U/L    Alkaline Phosphatase 49  39 - 117 U/L    Total Bilirubin 0.3  0.3 - 1.2 mg/dL    GFR calc non Af Amer >90  >90 mL/min    GFR calc Af Amer >90  >90 mL/min   PROTIME-INR     Status: Abnormal   Collection Time   06/26/12  6:25 AM      Component Value Range Comment   Prothrombin Time 21.4 (*) 11.6 -  15.2 seconds    INR 1.94 (*) 0.00 - 1.49   GLUCOSE, CAPILLARY     Status: Abnormal   Collection Time   06/26/12  7:14 AM      Component Value Range Comment   Glucose-Capillary 157 (*) 70 - 99 mg/dL   GLUCOSE, CAPILLARY     Status: Abnormal   Collection Time   06/26/12 11:40 AM      Component Value Range Comment   Glucose-Capillary 288 (*) 70 - 99 mg/dL    Comment 1 Documented in Chart      Comment 2 Notify RN     GLUCOSE, CAPILLARY     Status: Abnormal   Collection Time   06/26/12  5:02 PM      Component Value Range Comment   Glucose-Capillary 189 (*) 70 - 99 mg/dL   GLUCOSE, CAPILLARY     Status: Abnormal   Collection Time   06/26/12  9:11 PM      Component Value Range Comment   Glucose-Capillary 214 (*) 70 - 99 mg/dL   PROTIME-INR     Status: Abnormal   Collection Time   06/27/12  6:45 AM      Component Value Range Comment   Prothrombin Time 23.2 (*) 11.6 - 15.2 seconds    INR 2.16 (*) 0.00 - 1.49   GLUCOSE, CAPILLARY     Status: Abnormal   Collection Time   06/27/12  7:47 AM      Component Value Range Comment   Glucose-Capillary 177 (*) 70 - 99 mg/dL    Comment 1 Notify RN     GLUCOSE, CAPILLARY     Status: Abnormal   Collection Time   06/27/12 11:49 AM      Component Value Range Comment   Glucose-Capillary 237 (*) 70 - 99 mg/dL   GLUCOSE, CAPILLARY     Status: Abnormal   Collection Time   06/27/12  4:36 PM      Component Value Range Comment   Glucose-Capillary 184 (*) 70 - 99 mg/dL   GLUCOSE, CAPILLARY     Status: Abnormal    Collection Time   06/27/12  8:39 PM      Component Value Range Comment   Glucose-Capillary 172 (*) 70 - 99 mg/dL    Comment 1 Notify RN     PROTIME-INR     Status: Abnormal   Collection Time   06/28/12  7:08 AM      Component Value Range Comment   Prothrombin Time 23.9 (*) 11.6 - 15.2 seconds    INR 2.25 (*) 0.00 - 1.49   GLUCOSE, CAPILLARY     Status: Abnormal   Collection Time   06/28/12  7:28 AM      Component Value Range Comment   Glucose-Capillary 182 (*) 70 - 99 mg/dL    Comment 1 Notify RN        HEENT: normal Cardio: RRR Resp: CTA B/L GI: BS positive Extremity:  No Edema Skin:   Intact Neuro: Abnormal Sensory absent light touch and proprio, positive deep pain, Abnormal Motor 0/5 in LUE and LLE, Abnormal FMC Tone  reduced on L side, Tone:  Hypotonia, Aphasic, Inattention and Apraxic Musc/Skel:  Swelling L dorsum hand ecchymosis and swelling-mild Tenderness L side of back  Assessment/Plan: 1. Functional deficits secondary to R MCA infarct L HP, crossed aphasia,apraxia  which require 3+ hours per day of interdisciplinary therapy in a comprehensive inpatient rehab setting. Physiatrist is providing close team supervision and 24  hour management of active medical problems listed below. Physiatrist and rehab team continue to assess barriers to discharge/monitor patient progress toward functional and medical goals. FIM: FIM - Bathing Bathing Steps Patient Completed: Chest;Left Arm;Abdomen;Front perineal area;Right upper leg;Left upper leg Bathing: 3: Mod-Patient completes 5-7 20f 10 parts or 50-74%  FIM - Upper Body Dressing/Undressing Upper body dressing/undressing steps patient completed: Put head through opening of pull over shirt/dress;Thread/unthread right bra strap;Thread/unthread right sleeve of pullover shirt/dresss Upper body dressing/undressing: 2: Max-Patient completed 25-49% of tasks FIM - Lower Body Dressing/Undressing Lower body dressing/undressing steps patient  completed: Don/Doff right sock;Don/Doff left sock;Don/Doff right shoe;Don/Doff left shoe;Thread/unthread right pants leg Lower body dressing/undressing: 2: Max-Patient completed 25-49% of tasks  FIM - Hotel manager Devices: Grab bar or rail for support Toileting: 1: Total-Patient completed zero steps, helper did all 3  FIM - Diplomatic Services operational officer Devices: Grab bars Toilet Transfers: 2-To toilet/BSC: Max A (lift and lower assist);2-From toilet/BSC: Max A (lift and lower assist)  FIM - Press photographer Assistive Devices: Bed rails;Arm rests Bed/Chair Transfer: 3: Supine > Sit: Mod A (lifting assist/Pt. 50-74%/lift 2 legs;2: Bed > Chair or W/C: Max A (lift and lower assist)  FIM - Locomotion: Wheelchair Locomotion: Wheelchair: 1: Total Assistance/staff pushes wheelchair (Pt<25%) FIM - Locomotion: Ambulation Locomotion: Ambulation: 0: Activity did not occur (Attempted)  Comprehension Comprehension Mode: Auditory Comprehension: 4-Understands basic 75 - 89% of the time/requires cueing 10 - 24% of the time  Expression Expression Mode: Verbal Expression: 3-Expresses basic 50 - 74% of the time/requires cueing 25 - 50% of the time. Needs to repeat parts of sentences.  Social Interaction Social Interaction Mode: Asleep Social Interaction: 5-Interacts appropriately 90% of the time - Needs monitoring or encouragement for participation or interaction.  Problem Solving Problem Solving Mode: Asleep Problem Solving: 4-Solves basic 75 - 89% of the time/requires cueing 10 - 24% of the time  Memory Memory Mode: Asleep Memory: 4-Recognizes or recalls 75 - 89% of the time/requires cueing 10 - 24% of the time  Medical Problem List and Plan:  1. 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 DVT Prophylaxis/Anticoagulation: Chronic Coumadin therapy with a goal INR of 2-2.5 secondary  hemorrhagic transformation  3. Neuropsych: This patient is not capable of making decisions on his/her own behalf.  4. 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 Lisinopril 2.5 mg twice a day, Lanoxin 0.125 mg daily. Monitor with increased mobility. Cardiology to follow up as needed(Dr. Bensimhon)  5. Diabetes mellitus. Patient currently on sliding scale insulin. Patient on Glucophage 500 mg twice a day prior to admission and victoza 1.2 mL subcutaneously daily. Check blood sugars a.c. and at bedtime resume oral agents metformin today 6. Mood/depression. Patient on Wellbutrin prior to admission 75 mg daily. We'll discuss resuming this medication. Provide emotional support and positive reinforcement  7. Sensory dysesthesia related to parietal infarct and hemorrhagic transformation, will trial gabapentin 8.  Back pain mild/mod will use K pad and analgesic balm   LOS (Days) 3 A FACE TO FACE EVALUATION WAS PERFORMED  Tangy Drozdowski E 06/28/2012, 8:38 AM

## 2012-06-29 ENCOUNTER — Inpatient Hospital Stay (HOSPITAL_COMMUNITY): Payer: BC Managed Care – PPO | Admitting: Physical Therapy

## 2012-06-29 ENCOUNTER — Inpatient Hospital Stay (HOSPITAL_COMMUNITY): Payer: BC Managed Care – PPO | Admitting: Speech Pathology

## 2012-06-29 ENCOUNTER — Encounter (HOSPITAL_COMMUNITY): Payer: BC Managed Care – PPO | Admitting: Occupational Therapy

## 2012-06-29 ENCOUNTER — Inpatient Hospital Stay (HOSPITAL_COMMUNITY): Payer: BC Managed Care – PPO | Admitting: Occupational Therapy

## 2012-06-29 LAB — GLUCOSE, CAPILLARY
Glucose-Capillary: 210 mg/dL — ABNORMAL HIGH (ref 70–99)
Glucose-Capillary: 164 mg/dL — ABNORMAL HIGH (ref 70–99)

## 2012-06-29 LAB — PROTIME-INR: Prothrombin Time: 23.1 seconds — ABNORMAL HIGH (ref 11.6–15.2)

## 2012-06-29 MED ORDER — METFORMIN HCL 850 MG PO TABS
850.0000 mg | ORAL_TABLET | Freq: Two times a day (BID) | ORAL | Status: DC
  Administered 2012-06-29 (×2): 850 mg via ORAL
  Filled 2012-06-29 (×5): qty 1

## 2012-06-29 MED ORDER — WARFARIN SODIUM 5 MG PO TABS
5.0000 mg | ORAL_TABLET | Freq: Once | ORAL | Status: AC
  Administered 2012-06-29: 5 mg via ORAL
  Filled 2012-06-29: qty 1

## 2012-06-29 MED ORDER — GABAPENTIN 100 MG PO CAPS
100.0000 mg | ORAL_CAPSULE | Freq: Three times a day (TID) | ORAL | Status: DC
  Administered 2012-06-29 (×2): 100 mg via ORAL
  Filled 2012-06-29 (×6): qty 1

## 2012-06-29 NOTE — Progress Notes (Signed)
ANTICOAGULATION CONSULT NOTE - Follow Up Consult  Pharmacy Consult for Coumadin Indication: LV thrombus  No Known Allergies  Patient Measurements: Height: 5\' 6"  (167.6 cm) Weight: 169 lb 12.1 oz (77 kg) IBW/kg (Calculated) : 59.3   Vital Signs: Temp: 98.3 F (36.8 C) (10/28 0545) Temp src: Oral (10/28 0545) BP: 109/69 mmHg (10/28 0545) Pulse Rate: 85  (10/28 0545)  Labs:  Alvira Philips 06/29/12 0936 06/28/12 0708 06/27/12 0645  HGB -- -- --  HCT -- -- --  PLT -- -- --  APTT -- -- --  LABPROT 23.1* 23.9* 23.2*  INR 2.15* 2.25* 2.16*  HEPARINUNFRC -- -- --  CREATININE -- -- --  CKTOTAL -- -- --  CKMB -- -- --  TROPONINI -- -- --    Estimated Creatinine Clearance: 92.1 ml/min (by C-G formula based on Cr of 0.54).  Assessment: 45yof discharged [facility] on 10/18 on coumadin 5mg  daily and lovenox 80mg  q12 for new LV thrombus. INR 2.15 at time of discharge after only 2 doses of coumadin. Returns to the hospital that same night with difficulty speaking and L sided weakness. 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   Coumadin continued (ok by neuro) at home dose of 5mg  daily. INR is therapeutic today. Will continue to titrate warfarin conservatively given recent stroke.  Goal of Therapy:  INR 2-2.5 (lower goal secondary to hemorrhagic transformation of CVA)    Plan:  1) Continue with Coumadin 5mg  today 2) Follow up INR in AM  [name], Pharm.D., BCPS Clinical Pharmacist  Phone [phone] Pager [phone] 06/29/2012, 11:17 AM

## 2012-06-29 NOTE — Progress Notes (Signed)
Physical Therapy Session Note  Patient Details  Name: Margaret Hess MRN: [id number] Date of Birth: 1966/02/04  Today's Date: 06/29/2012 Time: 1115-1200 Time Calculation (min): 45 min  Short Term Goals: Week 1:  PT Short Term Goal 1 (Week 1): Patient will peform rolling bilaterally with mod assist PT Short Term Goal 2 (Week 1): Patient will perform bed <> w/c stand-pivot transfers with max assist PT Short Term Goal 3 (Week 1): Patient will propel w/c with R hemi technique with mod assist  Skilled Therapeutic Interventions/Progress Updates:    Bed mobility performed with moderate assist, facilitation of scapular and pelvic positioning with rolling verbal cues for sequencing and efficiency. Squat pivot transfer performed with moderate assist, moderate assist to manage Lt. LE as pt tends to allow Lt. LE to be poorly placed during transfer without realizing it. Wheelchair propulsion x 150' with Rt. UE/LE with min assist for straight path control and avoiding obstacles.   Sit <> stands in parallel bars with pt practicing sequencing of pre-stand positioning with decreased cues. Neuromuscular re-education in bars working on Lt. LE Forward taps onto low target then reaching task to facilitate balance without UE support and promote Lt. LE weight bearing. Pt had Lt. Ankle ace wrap dorsiflexion assist during standing activities, Lt. UE supported to minimize risk of shoulder subluxation.  Therapy Documentation Precautions:  Precautions Precautions: Fall Precaution Comments: Left hemiparesis, expressive aphasia Restrictions Weight Bearing Restrictions: No Pain: Pain Assessment Pain Assessment: No/denies pain Pain Score: 0-No pain  See FIM for current functional status  Therapy/Group: Individual Therapy  Wilhemina Bonito 06/29/2012, 12:06 PM

## 2012-06-29 NOTE — Progress Notes (Signed)
Advanced Heart Failure Rounding Note   Subjective:    46 yo history of DM,  HTN, NICM,  EF <20%.  LV thrombus, and Grade 3 diastolic dysfunction.  Mod TR.   Discharged from [facility] 06/19/12 after being treated for acute systolic heart failure and LV thrombus. Discharged on loveneox and coumadin. HF medications.   Presented to ED 06/20/12 with difficulty speaking and L sided weakness. CT of head negative. 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 Working with rehab. LLE strength improving some.   SBP 100-110. Weight stable. Denies orthopnea/PND/dyspnea. .     Objective:     Vital Signs:   Temp:  [97.5 F (36.4 C)-98.3 F (36.8 C)] 98.3 F (36.8 C) (10/28 0545) Pulse Rate:  [85-89] 85  (10/28 0545) Resp:  [18] 18  (10/28 0545) BP: (103-109)/(69-71) 109/69 mmHg (10/28 0545) SpO2:  [96 %-97 %] 97 % (10/28 0545) Weight:  [77 kg (169 lb 12.1 oz)] 77 kg (169 lb 12.1 oz) (10/28 0545) Last BM Date: 06/28/12  Weight change: Filed Weights   06/27/12 0500 06/28/12 0500 06/29/12 0545  Weight: 77.8 kg (171 lb 8.3 oz) 76.9 kg (169 lb 8.5 oz) 77 kg (169 lb 12.1 oz)    Intake/Output:   Intake/Output Summary (Last 24 hours) at 06/29/12 0917 Last data filed at 06/29/12 0800  Gross per 24 hour  Intake    840 ml  Output      0 ml  Net    840 ml     Physical Exam:            General:  Well appearing. No resp difficulty HEENT: L facial droop Neck: supple. JVP flat.  Carotids 2+ bilat; no bruits. No lymphadenopathy or thryomegaly appreciated. Cor: PMI nondisplaced. Tachycardic No gallops, rubs, or murmurs. Lungs: clear Abdomen: soft, nontender, nondistended. No hepatosplenomegaly. No bruits or masses. Good bowel sounds. Extremities: no cyanosis, clubbing, rash, edema L arm flaccid Neuro: alert & orientedx3, cranial nerves grossly intact. moves all  4 extremities w/o difficulty. Affect pleasant    Labs: Basic Metabolic Panel:  Lab 06/26/12 1610 06/25/12 0635 06/24/12 0850 06/23/12 0500  NA 135 137 134* 135  K 4.3 4.3 4.1 4.0  CL 101 102 99 97  CO2 23 23 23 25   GLUCOSE 167* 167* 313* 172*  BUN 15 17 15 15   CREATININE 0.54 0.57 0.55 0.64  CALCIUM 8.9 8.8 8.8 --  MG -- -- -- 2.1  PHOS -- -- -- 3.0    Liver Function Tests:  Lab 06/26/12 0625  AST 25  ALT 34  ALKPHOS 49  BILITOT 0.3  PROT 7.0  ALBUMIN 2.6*   No results found for this basename: LIPASE:5,AMYLASE:5 in the last 168 hours No results found for this basename: AMMONIA:3 in the last 168 hours  CBC:  Lab 06/26/12 0625 06/23/12 0500  WBC 12.8* 13.8*  NEUTROABS 7.3 --  HGB 12.8 15.2*  HCT 38.8 45.0  MCV 90.0 89.1  PLT 389 325    Cardiac Enzymes: No results found for this basename: CKTOTAL:5,CKMB:5,CKMBINDEX:5,TROPONINI:5 in the last 168 hours  BNP: BNP (last 3 results)  Basename 06/17/12 0455 06/15/12 1720  PROBNP 1930.0* 6932.0*       Imaging: No results found.   Medications:     Scheduled Medications:    . carvedilol  3.125 mg Oral BID WC  . digoxin  0.125  mg Oral Daily  . gabapentin  100 mg Oral TID  . influenza  inactive virus vaccine  0.5 mL Intramuscular Tomorrow-1000  . insulin aspart  0-15 Units Subcutaneous TID WC  . lisinopril  5 mg Oral BID  . metFORMIN  850 mg Oral BID WC  . pantoprazole  40 mg Oral QHS  . warfarin  5 mg Oral ONCE-1800  . Warfarin - Pharmacist Dosing Inpatient   Does not apply q1800  . DISCONTD: gabapentin  100 mg Oral BID  . DISCONTD: metFORMIN  500 mg Oral BID WC    Infusions:    PRN Medications: acetaminophen, albuterol, Muscle Rub, ondansetron (ZOFRAN) IV, ondansetron, polyethylene glycol, sorbitol   Assessment:  1. CVA, acute hemorrhagic R MCA    --c/b expressive aphasia and L-sided hemiparesis 2. 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 NICM, EF <20% 4.  LV thrombus    5. DM   6. Hypertension  Plan/Discussion:    HF is stable. Back on low-dose HF meds. Will not titrate further at this time as BP is soft. Will eventually need to restart lasix.   Coumadin management per Neuro. Pharmacy to dose.   9:17 AM Arvilla Meres, MD

## 2012-06-29 NOTE — Progress Notes (Signed)
Speech Language Pathology Daily Session Note  Patient Details  Name: Margaret Hess MRN: [id number] Date of Birth: [birth year]  Today's Date: 06/29/2012 Time: 1345-1430 Time Calculation (min): 45 min  Short Term Goals: Week 1: SLP Short Term Goal 1 (Week 1): Patient will consume regular textures and thin liquids with intermittent supervision cues and no overt s/s of aspiration. SLP Short Term Goal 2 (Week 1): Patient will verbally complete automatic sequences with min assist phonemic cues. SLP Short Term Goal 3 (Week 1): Patient will utilize multi-modal communication strategies to express basic wants and needs with supervision assist. SLP Short Term Goal 4 (Week 1): Patient will verbally produce phrase level (3-4 word) utterances to describe situations, pictures, etc. with mod assist verbal and written cues.  SLP Short Term Goal 5 (Week 1): Patient will self monitor accuracy of verbal expression with mod assist verbal and non-verbal cues.   Skilled Therapeutic Interventions: Treatment session focused on addressing speech-language goals. SLP facilitated session with use of multi-modal communication and supervision level verbal cues.  SLP also facilitated session with mod faded to min assist verbal, visual and articulatory cues to verbally produce 3-4 word utterances with written and picture present and for automatic language tasks. Patient required mod verbal and visual cues for self-monitoring and correcting of verbal errors and for left to right scanning strategies.    FIM:  Comprehension Comprehension Mode: Auditory Comprehension: 4-Understands basic 75 - 89% of the time/requires cueing 10 - 24% of the time Expression Expression Mode: Verbal Expression: 3-Expresses basic 50 - 74% of the time/requires cueing 25 - 50% of the time. Needs to repeat parts of sentences. Social Interaction Social Interaction: 4-Interacts appropriately 75 - 89% of the time - Needs redirection for appropriate  language or to initiate interaction. Problem Solving Problem Solving: 4-Solves basic 75 - 89% of the time/requires cueing 10 - 24% of the time Memory Memory: 4-Recognizes or recalls 75 - 89% of the time/requires cueing 10 - 24% of the time  Pain Pain Assessment Pain Assessment: No/denies pain  Therapy/Group: Individual Therapy  Jalaysia Lobb 06/29/2012, 3:26 PM

## 2012-06-29 NOTE — Progress Notes (Signed)
Patient ID: Margaret Hess, female   DOB: Mar 10, 1966, 46 y.o.   MRN: [id number]  Subjective/Complaints: Y/N inconsistant, follows simple command, has R/L discrimination problem Apraxia noted Able to repeat simple nouns Difficulty with "Ifs ands or buts" C/o LLE spasms but doesn't want new med Review of Systems  Unable to perform ROS: medical condition   Objective: Vital Signs: Blood pressure 109/69, pulse 85, temperature 98.3 F (36.8 C), temperature source Oral, resp. rate 18, height 5\' 6"  (1.676 m), weight 77 kg (169 lb 12.1 oz), SpO2 97.00%. No results found. Results for orders placed during the hospital encounter of 06/25/12 (from the past 72 hour(s))  GLUCOSE, CAPILLARY     Status: Abnormal   Collection Time   06/26/12 11:40 AM      Component Value Range Comment   Glucose-Capillary 288 (*) 70 - 99 mg/dL    Comment 1 Documented in Chart      Comment 2 Notify RN     GLUCOSE, CAPILLARY     Status: Abnormal   Collection Time   06/26/12  5:02 PM      Component Value Range Comment   Glucose-Capillary 189 (*) 70 - 99 mg/dL   GLUCOSE, CAPILLARY     Status: Abnormal   Collection Time   06/26/12  9:11 PM      Component Value Range Comment   Glucose-Capillary 214 (*) 70 - 99 mg/dL   PROTIME-INR     Status: Abnormal   Collection Time   06/27/12  6:45 AM      Component Value Range Comment   Prothrombin Time 23.2 (*) 11.6 - 15.2 seconds    INR 2.16 (*) 0.00 - 1.49   GLUCOSE, CAPILLARY     Status: Abnormal   Collection Time   06/27/12  7:47 AM      Component Value Range Comment   Glucose-Capillary 177 (*) 70 - 99 mg/dL    Comment 1 Notify RN     GLUCOSE, CAPILLARY     Status: Abnormal   Collection Time   06/27/12 11:49 AM      Component Value Range Comment   Glucose-Capillary 237 (*) 70 - 99 mg/dL   GLUCOSE, CAPILLARY     Status: Abnormal   Collection Time   06/27/12  4:36 PM      Component Value Range Comment   Glucose-Capillary 184 (*) 70 - 99 mg/dL   GLUCOSE,  CAPILLARY     Status: Abnormal   Collection Time   06/27/12  8:39 PM      Component Value Range Comment   Glucose-Capillary 172 (*) 70 - 99 mg/dL    Comment 1 Notify RN     PROTIME-INR     Status: Abnormal   Collection Time   06/28/12  7:08 AM      Component Value Range Comment   Prothrombin Time 23.9 (*) 11.6 - 15.2 seconds    INR 2.25 (*) 0.00 - 1.49   GLUCOSE, CAPILLARY     Status: Abnormal   Collection Time   06/28/12  7:28 AM      Component Value Range Comment   Glucose-Capillary 182 (*) 70 - 99 mg/dL    Comment 1 Notify RN     GLUCOSE, CAPILLARY     Status: Abnormal   Collection Time   06/28/12 11:33 AM      Component Value Range Comment   Glucose-Capillary 222 (*) 70 - 99 mg/dL    Comment 1 Notify RN  GLUCOSE, CAPILLARY     Status: Abnormal   Collection Time   06/28/12  4:55 PM      Component Value Range Comment   Glucose-Capillary 218 (*) 70 - 99 mg/dL   GLUCOSE, CAPILLARY     Status: Abnormal   Collection Time   06/28/12  9:06 PM      Component Value Range Comment   Glucose-Capillary 226 (*) 70 - 99 mg/dL    Comment 1 Notify RN        HEENT: normal Cardio: RRR Resp: CTA B/L GI: BS positive Extremity:  No Edema Skin:   Intact Neuro: Abnormal Sensory absent light touch and proprio, positive deep pain, Abnormal Motor 0/5 in LUE and LLE, Abnormal FMC Tone  reduced on L side, Tone:  Hypotonia, Aphasic, Inattention and Apraxic Musc/Skel:  Swelling L dorsum hand ecchymosis and swelling-mild Tenderness L side of back  Assessment/Plan: 1. Functional deficits secondary to R MCA infarct L HP, crossed aphasia,apraxia  which require 3+ hours per day of interdisciplinary therapy in a comprehensive inpatient rehab setting. Physiatrist is providing close team supervision and 24 hour management of active medical problems listed below. Physiatrist and rehab team continue to assess barriers to discharge/monitor patient progress toward functional and medical  goals. FIM: FIM - Bathing Bathing Steps Patient Completed: Chest;Left Arm;Abdomen;Front perineal area;Right upper leg;Left upper leg Bathing: 3: Mod-Patient completes 5-7 64f 10 parts or 50-74%  FIM - Upper Body Dressing/Undressing Upper body dressing/undressing steps patient completed: Put head through opening of pull over shirt/dress;Thread/unthread right bra strap;Thread/unthread right sleeve of pullover shirt/dresss Upper body dressing/undressing: 2: Max-Patient completed 25-49% of tasks FIM - Lower Body Dressing/Undressing Lower body dressing/undressing steps patient completed: Don/Doff right sock;Don/Doff left sock;Don/Doff right shoe;Don/Doff left shoe;Thread/unthread right pants leg Lower body dressing/undressing: 2: Max-Patient completed 25-49% of tasks  FIM - Toileting Toileting steps completed by patient: Performs perineal hygiene Toileting Assistive Devices: Grab bar or rail for support Toileting: 2: Max-Patient completed 1 of 3 steps  FIM - Diplomatic Services operational officer Devices: Grab bars Toilet Transfers: 2-From toilet/BSC: Max A (lift and lower assist);2-To toilet/BSC: Max A (lift and lower assist)  FIM - Banker Devices: Bed rails Bed/Chair Transfer: 2: Bed > Chair or W/C: Max A (lift and lower assist)  FIM - Locomotion: Wheelchair Locomotion: Wheelchair: 1: Total Assistance/staff pushes wheelchair (Pt<25%) FIM - Locomotion: Ambulation Locomotion: Ambulation: 0: Activity did not occur (Attempted)  Comprehension Comprehension Mode: Auditory Comprehension: 4-Understands basic 75 - 89% of the time/requires cueing 10 - 24% of the time  Expression Expression Mode: Verbal Expression: 3-Expresses basic 50 - 74% of the time/requires cueing 25 - 50% of the time. Needs to repeat parts of sentences.  Social Interaction Social Interaction Mode: Asleep Social Interaction: 5-Interacts appropriately 90% of the time - Needs  monitoring or encouragement for participation or interaction.  Problem Solving Problem Solving Mode: Asleep Problem Solving: 4-Solves basic 75 - 89% of the time/requires cueing 10 - 24% of the time  Memory Memory Mode: Asleep Memory: 4-Recognizes or recalls 75 - 89% of the time/requires cueing 10 - 24% of the time  Medical Problem List and Plan:  1. 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 DVT Prophylaxis/Anticoagulation: Chronic Coumadin therapy with a goal INR of 2-2.5 secondary hemorrhagic transformation  3. Neuropsych: This patient is not capable of making decisions on his/her own behalf.  4. 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 Lisinopril  2.5 mg twice a day, Lanoxin 0.125 mg daily. Monitor with increased mobility. Cardiology to follow up as needed(Dr. Bensimhon)  5. Diabetes mellitus. Patient currently on sliding scale insulin. Patient on Glucophage 500 mg twice a day prior to admission and victoza 1.2 mL subcutaneously daily. Check blood sugars a.c. and at bedtime resume oral agents metformin today 6. Mood/depression. Patient on Wellbutrin prior to admission 75 mg daily. We'll discuss resuming this medication. Provide emotional support and positive reinforcement  7. Sensory dysesthesia related to parietal infarct and hemorrhagic transformation, will increase gabapentin 8.  Back pain mild/mod will use K pad and analgesic balm   LOS (Days) 4 A FACE TO FACE EVALUATION WAS PERFORMED  KIRSTEINS,ANDREW E 06/29/2012, 7:47 AM

## 2012-06-29 NOTE — Progress Notes (Signed)
Occupational Therapy Note  Patient Details  Name: Margaret Hess MRN: [id number] Date of Birth: Nov 19, 1965 Today's Date: 06/29/2012  Time:  1300-1330 Pt initially denied pain but during PROM of LUE pt c/o pain in forearm with stretches; unable to rate; repositioned Individual Therapy   Pt practiced squat pivot transfers, reciprocal scooting forwards and backwards, maintaining sitting balance during reaching activities with RUE.  Pt exhibited LOB to left when reaching across midline with RUE requiring max A to maintain sitting balance.  Pt required min verbal cues to attend to task while in gym with minimal distractions.  In room with no distractions pt able to sustain attention.    Lavone Neri [facility] 06/29/2012, 2:36 PM

## 2012-06-29 NOTE — Progress Notes (Signed)
Physical Therapy Note  Patient Details  Name: Melvena Sporleder MRN: [id number] Date of Birth: [birth year] Today's Date: 06/29/2012  4098-1191 (55  Minutes) individual Pain: no complaint of pain Focus of treatment: Neuro re-ed LT LE Treatment: supine to side to sit mod assist; transfers scoot to squat/pivot mod assist (pt moderately impulsive); wc mobility- SBA unit 120 feet using RT extremities + max cues for direction finding; gait in parallel bars + ace wrap LT ankle max assist to advance LT LE and block LT knee in extension; sidelying gravity eliminated LT hip flexion ( pt able to flex hip actively 50% range), no active hip or knee extension noted.; supine bilateral hip flexion/extension using therapy ball; sit to stand to bedside table mod assist; standing to bed side table min/mod assist blocking LT knee extension while reaching to left for forced use of left knee extension.  Kayzen Kendzierski,JIM 06/29/2012, 7:51 AM

## 2012-06-29 NOTE — Progress Notes (Signed)
Inpatient Diabetes Program Recommendations  AACE/ADA: New Consensus Statement on Inpatient Glycemic Control (2013)  Target Ranges:  Prepandial:   less than 140 mg/dL      Peak postprandial:   less than 180 mg/dL (1-2 hours)      Critically ill patients:  140 - 180 mg/dL   Reason for Visit: Post-prandial hyperglycemia  Inpatient Diabetes Program Recommendations Insulin - Meal Coverage: Pt on Victoza (DPP4 inhibitor) at home which helps with post-prandial glucose.  If not going to use here, please add 3 - 4 units meal coverage tidwc.  Note: Thank you, Lenor Coffin, RN, CNS, Diabetes Coordinator [phone])

## 2012-06-30 ENCOUNTER — Inpatient Hospital Stay (HOSPITAL_COMMUNITY): Payer: BC Managed Care – PPO

## 2012-06-30 ENCOUNTER — Inpatient Hospital Stay (HOSPITAL_COMMUNITY): Payer: BC Managed Care – PPO | Admitting: Occupational Therapy

## 2012-06-30 ENCOUNTER — Inpatient Hospital Stay (HOSPITAL_COMMUNITY): Payer: BC Managed Care – PPO | Admitting: Speech Pathology

## 2012-06-30 DIAGNOSIS — E119 Type 2 diabetes mellitus without complications: Secondary | ICD-10-CM

## 2012-06-30 DIAGNOSIS — I69991 Dysphagia following unspecified cerebrovascular disease: Secondary | ICD-10-CM

## 2012-06-30 DIAGNOSIS — G8110 Spastic hemiplegia affecting unspecified side: Secondary | ICD-10-CM

## 2012-06-30 LAB — PROTIME-INR
INR: 2.35 — ABNORMAL HIGH (ref 0.00–1.49)
Prothrombin Time: 24.7 seconds — ABNORMAL HIGH (ref 11.6–15.2)

## 2012-06-30 LAB — GLUCOSE, CAPILLARY
Glucose-Capillary: 200 mg/dL — ABNORMAL HIGH (ref 70–99)
Glucose-Capillary: 125 mg/dL — ABNORMAL HIGH (ref 70–99)
Glucose-Capillary: 155 mg/dL — ABNORMAL HIGH (ref 70–99)

## 2012-06-30 MED ORDER — GABAPENTIN 100 MG PO CAPS
200.0000 mg | ORAL_CAPSULE | Freq: Three times a day (TID) | ORAL | Status: DC
  Administered 2012-06-30 – 2012-07-08 (×25): 200 mg via ORAL
  Filled 2012-06-30 (×29): qty 2

## 2012-06-30 MED ORDER — WARFARIN SODIUM 5 MG PO TABS
5.0000 mg | ORAL_TABLET | Freq: Once | ORAL | Status: AC
  Administered 2012-06-30: 5 mg via ORAL
  Filled 2012-06-30: qty 1

## 2012-06-30 MED ORDER — METFORMIN HCL 500 MG PO TABS
1000.0000 mg | ORAL_TABLET | Freq: Two times a day (BID) | ORAL | Status: DC
  Administered 2012-06-30 – 2012-07-22 (×45): 1000 mg via ORAL
  Filled 2012-06-30 (×47): qty 2

## 2012-06-30 NOTE — Progress Notes (Signed)
ANTICOAGULATION CONSULT NOTE - Follow Up Consult  Pharmacy Consult for Coumadin Indication: LV thrombus  No Known Allergies  Patient Measurements: Height: 5\' 6"  (167.6 cm) Weight: 181 lb 14.1 oz (82.5 kg) IBW/kg (Calculated) : 59.3   Vital Signs: Temp: 98.4 F (36.9 C) (10/29 0700) Temp src: Oral (10/29 0700) BP: 109/73 mmHg (10/29 0700) Pulse Rate: 93  (10/29 0700)  Labs:  Basename 06/30/12 0635 06/29/12 0936 06/28/12 0708  HGB -- -- --  HCT -- -- --  PLT -- -- --  APTT -- -- --  LABPROT 24.7* 23.1* 23.9*  INR 2.35* 2.15* 2.25*  HEPARINUNFRC -- -- --  CREATININE -- -- --  CKTOTAL -- -- --  CKMB -- -- --  TROPONINI -- -- --    Estimated Creatinine Clearance: 95.2 ml/min (by C-G formula based on Cr of 0.54).  Assessment: 45yof discharged [facility] on [facility]/18 on coumadin 5mg  daily and lovenox 80mg  q12 for new LV thrombus. INR 2.15 at time of discharge after only 2 doses of coumadin. Returns to the hospital that same night with difficulty speaking and L sided weakness. 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   Coumadin continued (ok by neuro) at home dose of 5mg  daily. INR continues to be therapeutic on 5mg  daily regimen. Will continue to titrate warfarin conservatively given recent stroke. If INR continues to increase in am will reduce dose, if stable will consider MWF INR checks.  Goal of Therapy:  INR 2-2.5 (lower goal secondary to hemorrhagic transformation of CVA)    Plan:  1) Continue with Coumadin 5mg  today 2) Follow up INR in AM  Sheppard Coil, Pharm.D., BCPS Clinical Pharmacist  Phone [phone] Pager [phone]  06/30/2012, 8:55 AM

## 2012-06-30 NOTE — Progress Notes (Signed)
Occupational Therapy Session Note  Patient Details  Name: Margaret Hess MRN: [id number] Date of Birth: April 16, 1966  Today's Date: 06/30/2012 Time: 1005-1105 Time Calculation (min): 60 min  Short Term Goals: Week 1:  OT Short Term Goal 1 (Week 1): Pt will maintain dynamic sitting balance with mod assist during selfcare activities. OT Short Term Goal 2 (Week 1): Pt will position the LUE prior and after transitional movements with mod questioning cues. OT Short Term Goal 3 (Week 1): Pt will perform all bathing with mod assist sit to stand. OT Short Term Goal 4 (Week 1): Pt will perform UB dressing with min assist to donn pullover shirt. OT Short Term Goal 5 (Week 1): Pt will perform toilet transfers with mod assist to 3:1.  Skilled Therapeutic Interventions/Progress Updates:      Pt seen for BADL retraining of toileting, bathing, and dressing with a focus on LUE management, hemi dressing techniques, sit to stand, standing at sink.  Pt had increase in LLE  flexor tone in standing.  Pt followed directions well and demonstrated improved dynamic sitting balance especially with reaching to the left.  Reviewed self ROM and massage to LUE.  Therapy Documentation Precautions:  Precautions Precautions: Fall Precaution Comments: aphasic Restrictions Weight Bearing Restrictions: No  Pain: Pain Assessment Pain Assessment: No/denies pain ADL:  See FIM for current functional status  Therapy/Group: Individual Therapy  SAGUIER,JULIA 06/30/2012, 12:06 PM

## 2012-06-30 NOTE — Progress Notes (Signed)
Physical Therapy Session Note  Patient Details  Name: Margaret Hess MRN: [id number] Date of Birth: 07/19/1966  Today's Date: 06/30/2012 Time: [phone] and  [phone] Time Calculation (min):53 and 30 min  Short Term Goals: Week 1:  PT Short Term Goal 1 (Week 1): Patient will peform rolling bilaterally with mod assist PT Short Term Goal 2 (Week 1): Patient will perform bed <> w/c stand-pivot transfers with max assist PT Short Term Goal 3 (Week 1): Patient will propel w/c with R hemi technique with mod assist  Skilled Therapeutic Interventions/Progress Updates:   AM-  W/c mobility using hemi technique with RUe and RLE, x 100' with superviison, VCs for initiation.  Exchanged w/c for slightly higher seat, firmer cushion for improved form when propelling w/c.  Squat pivot transfer to R and L with mod assist for = wt bearing, completely moving L hip;, L foot placement  neuromuscular re-education  Via VCS, demo, manual cues, for:  - sitting balance,  with reaching R UE in all directions, occasional min assist for LOB to L   - scooting L>< R with emphasis on LLE wt bearing  -standing with RUE support, wt shifting attempting to elicite L hip or knee extension for stance stability.  PM-  W/c mobility using hemi technique x 75' x 2 with supervision.  W/c> NuStep with mod assist stand pivot, focusing on completely bringing L hip onto surface of NuStep seat.  Therapeutic exercise performed with bil LEs and bil UEs to increase strength for functional mobility; with orthosis to contain L hand, bolster to separate bil upper legs, x 8 minutes at resistance 4, facilitating trunk rotation, LLE mass extension.     neuromuscular re-education: after about 6 minutes on NuStep, with tactile cues, demo, pt was able to visually track L knee and control (flail) abduction somewhat for a more neutral hip.    Therapy Documentation Precautions:  Precautions Precautions: Fall Precaution Comments:  aphasic Restrictions Weight Bearing Restrictions: No   Pain: Pain Assessment Pain Assessment: No/denies pain am and pm    See FIM for current functional status  Therapy/Group: Individual Therapy  [name] 06/30/2012, 3:00 PM

## 2012-06-30 NOTE — Progress Notes (Signed)
Patient ID: Margaret Hess, female   DOB: 1966-05-11, 46 y.o.   MRN: [id number]  Subjective/Complaints: "nerve pain" points to L side Review of Systems  Unable to perform ROS: medical condition   Objective: Vital Signs: Blood pressure 109/73, pulse 93, temperature 98.4 F (36.9 C), temperature source Oral, resp. rate 18, height 5\' 6"  (1.676 m), weight 82.5 kg (181 lb 14.1 oz), SpO2 99.00%. No results found. Results for orders placed during the hospital encounter of 06/25/12 (from the past 72 hour(s))  GLUCOSE, CAPILLARY     Status: Abnormal   Collection Time   06/27/12 11:49 AM      Component Value Range Comment   Glucose-Capillary 237 (*) 70 - 99 mg/dL   GLUCOSE, CAPILLARY     Status: Abnormal   Collection Time   06/27/12  4:36 PM      Component Value Range Comment   Glucose-Capillary 184 (*) 70 - 99 mg/dL   GLUCOSE, CAPILLARY     Status: Abnormal   Collection Time   06/27/12  8:39 PM      Component Value Range Comment   Glucose-Capillary 172 (*) 70 - 99 mg/dL    Comment 1 Notify RN     PROTIME-INR     Status: Abnormal   Collection Time   06/28/12  7:08 AM      Component Value Range Comment   Prothrombin Time 23.9 (*) 11.6 - 15.2 seconds    INR 2.25 (*) 0.00 - 1.49   GLUCOSE, CAPILLARY     Status: Abnormal   Collection Time   06/28/12  7:28 AM      Component Value Range Comment   Glucose-Capillary 182 (*) 70 - 99 mg/dL    Comment 1 Notify RN     GLUCOSE, CAPILLARY     Status: Abnormal   Collection Time   06/28/12 11:33 AM      Component Value Range Comment   Glucose-Capillary 222 (*) 70 - 99 mg/dL    Comment 1 Notify RN     GLUCOSE, CAPILLARY     Status: Abnormal   Collection Time   06/28/12  4:55 PM      Component Value Range Comment   Glucose-Capillary 218 (*) 70 - 99 mg/dL   GLUCOSE, CAPILLARY     Status: Abnormal   Collection Time   06/28/12  9:06 PM      Component Value Range Comment   Glucose-Capillary 226 (*) 70 - 99 mg/dL    Comment 1 Notify RN       GLUCOSE, CAPILLARY     Status: Abnormal   Collection Time   06/29/12  7:45 AM      Component Value Range Comment   Glucose-Capillary 169 (*) 70 - 99 mg/dL    Comment 1 Notify RN     PROTIME-INR     Status: Abnormal   Collection Time   06/29/12  9:36 AM      Component Value Range Comment   Prothrombin Time 23.1 (*) 11.6 - 15.2 seconds    INR 2.15 (*) 0.00 - 1.49   GLUCOSE, CAPILLARY     Status: Abnormal   Collection Time   06/29/12 11:59 AM      Component Value Range Comment   Glucose-Capillary 210 (*) 70 - 99 mg/dL    Comment 1 Notify RN     GLUCOSE, CAPILLARY     Status: Abnormal   Collection Time   06/29/12  4:18 PM      Component  Value Range Comment   Glucose-Capillary 164 (*) 70 - 99 mg/dL   GLUCOSE, CAPILLARY     Status: Abnormal   Collection Time   06/29/12  8:58 PM      Component Value Range Comment   Glucose-Capillary 165 (*) 70 - 99 mg/dL   PROTIME-INR     Status: Abnormal   Collection Time   06/30/12  6:35 AM      Component Value Range Comment   Prothrombin Time 24.7 (*) 11.6 - 15.2 seconds    INR 2.35 (*) 0.00 - 1.49      HEENT: normal Cardio: RRR Resp: CTA B/L GI: BS positive Extremity:  No Edema Skin:   Intact Neuro: Abnormal Sensory absent light touch and proprio, positive deep pain, Abnormal Motor 0/5 in LUE and LLE, Abnormal FMC Tone  reduced on L side, Tone:  Hypotonia, Aphasic, Inattention and Apraxic Musc/Skel:  Swelling L dorsum hand ecchymosis and swelling-mild   Assessment/Plan: 1. Functional deficits secondary to R MCA infarct L HP, crossed aphasia,apraxia  which require 3+ hours per day of interdisciplinary therapy in a comprehensive inpatient rehab setting. Physiatrist is providing close team supervision and 24 hour management of active medical problems listed below. Physiatrist and rehab team continue to assess barriers to discharge/monitor patient progress toward functional and medical goals. FIM: FIM - Bathing Bathing Steps Patient  Completed: Chest;Left Arm;Abdomen;Front perineal area;Right upper leg;Left upper leg Bathing: 3: Mod-Patient completes 5-7 79f 10 parts or 50-74%  FIM - Upper Body Dressing/Undressing Upper body dressing/undressing steps patient completed: Put head through opening of pull over shirt/dress;Thread/unthread right bra strap;Thread/unthread right sleeve of pullover shirt/dresss Upper body dressing/undressing: 2: Max-Patient completed 25-49% of tasks FIM - Lower Body Dressing/Undressing Lower body dressing/undressing steps patient completed: Don/Doff right sock;Don/Doff left sock;Don/Doff right shoe;Don/Doff left shoe;Thread/unthread right pants leg Lower body dressing/undressing: 2: Max-Patient completed 25-49% of tasks  FIM - Toileting Toileting steps completed by patient: Performs perineal hygiene Toileting Assistive Devices: Grab bar or rail for support Toileting: 2: Max-Patient completed 1 of 3 steps  FIM - Diplomatic Services operational officer Devices: Grab bars Toilet Transfers: 2-From toilet/BSC: Max A (lift and lower assist);2-To toilet/BSC: Max A (lift and lower assist)  FIM - Banker Devices: Bed rails Bed/Chair Transfer: 3: Supine > Sit: Mod A (lifting assist/Pt. 50-74%/lift 2 legs;3: Bed > Chair or W/C: Mod A (lift or lower assist)  FIM - Locomotion: Wheelchair Locomotion: Wheelchair: 4: Travels 150 ft or more: maneuvers on rugs and over door sillls with minimal assistance (Pt.>75%) FIM - Locomotion: Ambulation Locomotion: Ambulation: 0: Activity did not occur (Attempted)  Comprehension Comprehension Mode: Auditory Comprehension: 4-Understands basic 75 - 89% of the time/requires cueing 10 - 24% of the time  Expression Expression Mode: Verbal Expression: 3-Expresses basic 50 - 74% of the time/requires cueing 25 - 50% of the time. Needs to repeat parts of sentences.  Social Interaction Social Interaction Mode: Asleep Social  Interaction: 4-Interacts appropriately 75 - 89% of the time - Needs redirection for appropriate language or to initiate interaction.  Problem Solving Problem Solving Mode: Asleep Problem Solving: 4-Solves basic 75 - 89% of the time/requires cueing 10 - 24% of the time  Memory Memory Mode: Asleep Memory: 4-Recognizes or recalls 75 - 89% of the time/requires cueing 10 - 24% of the time  Medical Problem List and Plan:  1. 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 DVT Prophylaxis/Anticoagulation: Chronic Coumadin therapy  with a goal INR of 2-2.5 secondary hemorrhagic transformation  3. Neuropsych: This patient is not capable of making decisions on his/her own behalf.  4. 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 Lisinopril 2.5 mg twice a day, Lanoxin 0.125 mg daily. Monitor with increased mobility. Cardiology to follow up as needed(Dr. Bensimhon)  5. Diabetes mellitus. Patient currently on sliding scale insulin. Patient on Glucophage 500 mg twice a day prior to admission and victoza 1.2 mL subcutaneously daily. Check blood sugars a.c. and at bedtime increase oral agents metformin today.  If this fails then restart Victoza 6. Mood/depression. Patient on Wellbutrin prior to admission 75 mg daily. We'll discuss resuming this medication. Provide emotional support and positive reinforcement  7. Sensory dysesthesia related to parietal infarct and hemorrhagic transformation, will increase gabapentin 8.  Back pain mild/mod will use K pad and analgesic balm   LOS (Days) 5 A FACE TO FACE EVALUATION WAS PERFORMED  Margaret Hess 06/30/2012, 7:48 AM

## 2012-06-30 NOTE — Progress Notes (Signed)
Speech Language Pathology Daily Session Note  Patient Details  Name: Margaret Hess MRN: [id number] Date of Birth: July 08, 1966  Today's Date: 06/30/2012 Time: 0800-0845 Time Calculation (min): 45 min  Short Term Goals: Week 1: SLP Short Term Goal 1 (Week 1): Patient will consume regular textures and thin liquids with intermittent supervision cues and no overt s/s of aspiration. SLP Short Term Goal 2 (Week 1): Patient will verbally complete automatic sequences with min assist phonemic cues. SLP Short Term Goal 3 (Week 1): Patient will utilize multi-modal communication strategies to express basic wants and needs with supervision assist. SLP Short Term Goal 4 (Week 1): Patient will verbally produce phrase level (3-4 word) utterances to describe situations, pictures, etc. with mod assist verbal and written cues.  SLP Short Term Goal 5 (Week 1): Patient will self monitor accuracy of verbal expression with mod assist verbal and non-verbal cues.   Skilled Therapeutic Interventions: Treatment focus on dysphagia and language goals. Pt consumed meal of Dys. 3 textures and thin liquids and demonstrated efficient mastication without overt s/s of aspiration. Pt also attended to left sided oral pocketing  with Mod I. Pt given trials of regular textures and demonstrated efficient mastication and as a result recommend upgrade to regular textures.  Pt with increased verbal expression and named items on tray with 100% accuracy with Min verbal and visual cues to self-monitor and correct errors. Pt also utilizing 1-2 word utterances to make needs known.    FIM:  Comprehension Comprehension Mode: Auditory Comprehension: 4-Understands basic 75 - 89% of the time/requires cueing 10 - 24% of the time Expression Expression Mode: Verbal Expression: 3-Expresses basic 50 - 74% of the time/requires cueing 25 - 50% of the time. Needs to repeat parts of sentences. Social Interaction Social Interaction: 5-Interacts  appropriately 90% of the time - Needs monitoring or encouragement for participation or interaction. Problem Solving Problem Solving: 4-Solves basic 75 - 89% of the time/requires cueing 10 - 24% of the time Memory Memory: 4-Recognizes or recalls 75 - 89% of the time/requires cueing 10 - 24% of the time FIM - Eating Eating Activity: 5: Set-up assist for open containers;5: Supervision/cues  Pain Pain Assessment Pain Assessment: No/denies pain  Therapy/Group: Individual Therapy  Gilles Trimpe 06/30/2012, 11:41 AM

## 2012-06-30 NOTE — Progress Notes (Signed)
Advanced Heart Failure Rounding Note   Subjective:    46 yo history of DM,  HTN, NICM,  EF <20%.  LV thrombus, and Grade 3 diastolic dysfunction.  Mod TR.   Discharged from [facility] 06/19/12 after being treated for acute systolic heart failure and LV thrombus. Discharged on loveneox and coumadin. HF medications.   Presented to ED 06/20/12 with difficulty speaking and L sided weakness. CT of head negative. 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 Working with rehab. LLE strength and expressive aphasia improving some.   Vitals stable. Denies orthopnea/PND/dyspnea. Weight recorded as being up 12 pounds - likely inaccurate.  .   Objective:     Vital Signs:   Temp:  [97.5 F (36.4 C)-98.4 F (36.9 C)] 98.4 F (36.9 C) (10/29 0700) Pulse Rate:  [92-93] 93  (10/29 0700) Resp:  [18] 18  (10/29 0700) BP: (103-114)/(67-78) 109/73 mmHg (10/29 0700) SpO2:  [95 %-99 %] 99 % (10/29 0700) Weight:  [82.5 kg (181 lb 14.1 oz)] 82.5 kg (181 lb 14.1 oz) (10/29 0700) Last BM Date: 06/29/12  Weight change: Filed Weights   06/28/12 0500 06/29/12 0545 06/30/12 0700  Weight: 76.9 kg (169 lb 8.5 oz) 77 kg (169 lb 12.1 oz) 82.5 kg (181 lb 14.1 oz)    Intake/Output:   Intake/Output Summary (Last 24 hours) at 06/30/12 0745 Last data filed at 06/29/12 2100  Gross per 24 hour  Intake    960 ml  Output      4 ml  Net    956 ml     Physical Exam:            General:  Well appearing. No resp difficulty sitting in WC HEENT: L facial droop Neck: supple. JVP flat.  Carotids 2+ bilat; no bruits. No lymphadenopathy or thryomegaly appreciated. Cor: PMI nondisplaced. Tachycardic No gallops, rubs, or murmurs. Lungs: clear Abdomen: soft, nontender, nondistended. No hepatosplenomegaly. No bruits or masses. Good bowel sounds. Extremities: no cyanosis, clubbing, rash,  edema L arm flaccid Neuro: alert & orientedx3, cranial nerves grossly intact. moves all 4 extremities w/o difficulty. Affect pleasant    Labs: Basic Metabolic Panel:  Lab 06/26/12 1914 06/25/12 0635 06/24/12 0850  NA 135 137 134*  K 4.3 4.3 4.1  CL 101 102 99  CO2 23 23 23   GLUCOSE 167* 167* 313*  BUN 15 17 15   CREATININE 0.54 0.57 0.55  CALCIUM 8.9 8.8 8.8  MG -- -- --  PHOS -- -- --    Liver Function Tests:  Lab 06/26/12 0625  AST 25  ALT 34  ALKPHOS 49  BILITOT 0.3  PROT 7.0  ALBUMIN 2.6*   No results found for this basename: LIPASE:5,AMYLASE:5 in the last 168 hours No results found for this basename: AMMONIA:3 in the last 168 hours  CBC:  Lab 06/26/12 0625  WBC 12.8*  NEUTROABS 7.3  HGB 12.8  HCT 38.8  MCV 90.0  PLT 389    Cardiac Enzymes: No results found for this basename: CKTOTAL:5,CKMB:5,CKMBINDEX:5,TROPONINI:5 in the last 168 hours  BNP: BNP (last 3 results)  Basename 06/17/12 0455 06/15/12 1720  PROBNP 1930.0* 6932.0*       Imaging: No results found.   Medications:     Scheduled Medications:    . carvedilol  3.125 mg Oral BID WC  . digoxin  0.125 mg Oral Daily  . gabapentin  100 mg Oral TID  . influenza  inactive virus vaccine  0.5 mL Intramuscular Tomorrow-1000  . insulin aspart  0-15 Units Subcutaneous TID WC  . lisinopril  5 mg Oral BID  . metFORMIN  850 mg Oral BID WC  . pantoprazole  40 mg Oral QHS  . warfarin  5 mg Oral ONCE-1800  . Warfarin - Pharmacist Dosing Inpatient   Does not apply q1800  . DISCONTD: gabapentin  100 mg Oral BID  . DISCONTD: metFORMIN  500 mg Oral BID WC    Infusions:    PRN Medications: acetaminophen, albuterol, Muscle Rub, ondansetron (ZOFRAN) IV, ondansetron, polyethylene glycol, sorbitol   Assessment:   1. CVA, acute hemorrhagic R MCA    --c/b expressive aphasia and L-sided hemiparesis 2. 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 NICM, EF <20% 4.  LV thrombus  5.  DM   6. Hypertension  Plan/Discussion:     Remains very stable from HF perspective. Volume status looks fine. Will check BMET in am. If stable will consider low-dose spiro. Will check echo prior to d/c to re-assess LV function.  Appreciate Rehab team's work.  Daniel Bensimhon,MD 11:38 AM

## 2012-07-01 ENCOUNTER — Inpatient Hospital Stay (HOSPITAL_COMMUNITY): Payer: BC Managed Care – PPO

## 2012-07-01 ENCOUNTER — Inpatient Hospital Stay (HOSPITAL_COMMUNITY): Payer: BC Managed Care – PPO | Admitting: Speech Pathology

## 2012-07-01 ENCOUNTER — Inpatient Hospital Stay (HOSPITAL_COMMUNITY): Payer: BC Managed Care – PPO | Admitting: Occupational Therapy

## 2012-07-01 LAB — GLUCOSE, CAPILLARY: Glucose-Capillary: 172 mg/dL — ABNORMAL HIGH (ref 70–99)

## 2012-07-01 LAB — BASIC METABOLIC PANEL
GFR calc Af Amer: >90 mL/min (ref >90)
GFR calc non Af Amer: >90 mL/min (ref >90)
Potassium: 4.2 mEq/L (ref 3.5–5.1)
Sodium: 137 mEq/L (ref 135–145)

## 2012-07-01 LAB — PROTIME-INR
INR: 2.3 — ABNORMAL HIGH (ref 0.00–1.49)
Prothrombin Time: 24.3 seconds — ABNORMAL HIGH (ref 11.6–15.2)

## 2012-07-01 MED ORDER — WARFARIN SODIUM 5 MG PO TABS
5.0000 mg | ORAL_TABLET | Freq: Every day | ORAL | Status: DC
  Administered 2012-07-01 – 2012-07-02 (×2): 5 mg via ORAL
  Filled 2012-07-01 (×3): qty 1

## 2012-07-01 MED ORDER — SPIRONOLACTONE 12.5 MG HALF TABLET
12.5000 mg | ORAL_TABLET | Freq: Every day | ORAL | Status: DC
  Administered 2012-07-01 – 2012-07-17 (×17): 12.5 mg via ORAL
  Filled 2012-07-01 (×19): qty 1

## 2012-07-01 NOTE — Progress Notes (Signed)
Speech Language Pathology Daily Session Note & Weekly Progress Note  Patient Details  Name: Margaret Hess MRN: [id number] Date of Birth: 1965-09-07  Today's Date: 07/01/2012 Time: 1350-1445 Time Calculation (min): 55 min  Short Term Goals: Week 1: SLP Short Term Goal 1 (Week 1): Patient will consume regular textures and thin liquids with intermittent supervision cues and no overt s/s of aspiration. SLP Short Term Goal 2 (Week 1): Patient will verbally complete automatic sequences with min assist phonemic cues. SLP Short Term Goal 3 (Week 1): Patient will utilize multi-modal communication strategies to express basic wants and needs with supervision assist. SLP Short Term Goal 4 (Week 1): Patient will verbally produce phrase level (3-4 word) utterances to describe situations, pictures, etc. with mod assist verbal and written cues.  SLP Short Term Goal 5 (Week 1): Patient will self monitor accuracy of verbal expression with mod assist verbal and non-verbal cues.   Skilled Therapeutic Interventions: Treatment session focused on addressing language goals; SLP facilitated session with written phrases (3-4 words) and 50% accurate production of articles in verbal expression and max assist verbal and non-verbal cues to self-monitor errors.  SLP facilitated writing task with model word to trace and mod assist verbal and non-verbal cues to self-monitor errors.  SLP facilitated session with functional reading task, menu to order dinner with overall supervision level verbal cues for broad accuracy with information (syntax errors still present but did not appear to impact functional comprehension of information).   FIM:  Comprehension Comprehension Mode: Auditory Comprehension: 5-Understands basic 90% of the time/requires cueing < 10% of the time Expression Expression Mode: Verbal Expression: 3-Expresses basic 50 - 74% of the time/requires cueing 25 - 50% of the time. Needs to repeat parts of  sentences. Social Interaction Social Interaction: 5-Interacts appropriately 90% of the time - Needs monitoring or encouragement for participation or interaction. Problem Solving Problem Solving: 4-Solves basic 75 - 89% of the time/requires cueing 10 - 24% of the time Memory Memory: 5-Recognizes or recalls 90% of the time/requires cueing < 10% of the time FIM - Eating Eating Activity: 5: Set-up assist for open containers  Pain Pain Assessment Pain Assessment: No/denies pain  Therapy/Group: Individual Therapy   Speech Language Pathology Weekly Progress Note  Patient Details  Name: Margaret Hess MRN: [id number] Date of Birth: 02-Nov-1965  Today's Date: 07/01/2012  Short Term Goals: Week 1: SLP Short Term Goal 1 (Week 1): Patient will consume regular textures and thin liquids with intermittent supervision cues and no overt s/s of aspiration. SLP Short Term Goal 1 - Progress (Week 1): Progressing toward goal SLP Short Term Goal 2 (Week 1): Patient will verbally complete automatic sequences with min assist phonemic cues. SLP Short Term Goal 2 - Progress (Week 1): Met SLP Short Term Goal 3 (Week 1): Patient will utilize multi-modal communication strategies to express basic wants and needs with supervision assist. SLP Short Term Goal 3 - Progress (Week 1): Met SLP Short Term Goal 4 (Week 1): Patient will verbally produce phrase level (3-4 word) utterances to describe situations, pictures, etc. with mod assist verbal and written cues.  SLP Short Term Goal 4 - Progress (Week 1): Progressing toward goal SLP Short Term Goal 5 (Week 1): Patient will self monitor accuracy of verbal expression with mod assist verbal and non-verbal cues.  SLP Short Term Goal 5 - Progress (Week 1): Progressing toward goal Week 2: SLP Short Term Goal 1 (Week 2): Patient will consume regular textures and thin liquids with intermittent supervision  cues and no overt s/s of aspiration. SLP Short Term Goal 2 (Week 2):  Patient will verbally complete automatic sequences with supervision level assist phonemic cues. SLP Short Term Goal 3 (Week 2): Patient will verbally produce phrase level (3-4 word) utterances to describe situations, pictures, etc. with mod assist verbal and written cues. SLP Short Term Goal 4 (Week 2): Patient will self monitor accuracy of verbal expression with mod assist verbal and non-verbal cues. SLP Short Term Goal 5 (Week 2): Patient will write by copying basic, biographical information with mod assist multi-modal cues to self monitor and correct errors.  Weekly Progress Updates: Patient met 2 out of 5 short term objectives this reporting period with gains in use of gestures and multi-modal communication to express wants and needs as well as ability to verbally complete automatic sequences such as counting and days of the week with supervision level assist.  Patient has also made functional gains in swallowing and her diet has been advanced to regular textures with continued need for full supervision with p.o. intake and progression of automatic sequences to include months of the year.  Patient has also made funcitonal gains in vebral expression; however her ability to self monitor her own expression has impacted her awarenss of errors.   SLP Frequency: 1-2 X/day, 30-60 minutes;5 out of 7 days Estimated Length of Stay: anticipated discharge 11/21 SLP Treatment/Interventions: Cognitive remediation/compensation;Cueing hierarchy;Dysphagia/aspiration precaution training;Environmental controls;Functional tasks;Internal/external aids;Multimodal communication approach;Patient/family education;Speech/Language facilitation;Therapeutic Activities  Charlane Ferretti., CCC-SLP [phone]  Quintus Premo 07/01/2012, 4:36 PM

## 2012-07-01 NOTE — Progress Notes (Signed)
Social Work Patient ID: Margaret Hess, female   DOB: 1965/09/05, 46 y.o.   MRN: [id number] Met with pt and sister to inform of team conference goals-min level and discharge 11/21.  Pt is concerned she will be too much care at discharge. Discussed will wait and see and monitor her progress and toward the end discuss plan.  Both are aware of the options and insurance issues with both. Both encouraged by the progress she has made thus far.  Continue to work on discharge needs and provide supportive counseling to pt.

## 2012-07-01 NOTE — Progress Notes (Signed)
Occupational Therapy Session Note  Patient Details  Name: Margaret Hess MRN: [id number] Date of Birth: Jun 20, 1966  Today's Date: 07/01/2012 Time: 2130-8657 Time Calculation (min): 97 min  Short Term Goals: Week 1:  OT Short Term Goal 1 (Week 1): Pt will maintain dynamic sitting balance with mod assist during selfcare activities. OT Short Term Goal 2 (Week 1): Pt will position the LUE prior and after transitional movements with mod questioning cues. OT Short Term Goal 3 (Week 1): Pt will perform all bathing with mod assist sit to stand. OT Short Term Goal 4 (Week 1): Pt will perform UB dressing with min assist to donn pullover shirt. OT Short Term Goal 5 (Week 1): Pt will perform toilet transfers with mod assist to 3:1.  Skilled Therapeutic Interventions/Progress Updates:      Pt seen for BADL retraining of toileting, bathing, and dressing with a focus on squat pivot transfers, sit to stand, dynamic sitting balance, LUE management.  Improved postural control as patient was standing at sink with mod assist to stabilize LLE with LUE weight bearing on the sink.  Worked on management  Of LLE with LB dressing tasks.  Pt seen in gym to work on scapular ROM, with facilitation to elicit movement.  Trace active movement with scapular elevation.  Pt worked in sitting, sidelying, and supine with various a/arom exercises. Slight increase in tone felt in tricep extensors and finger flexors  with trace movement of elbow flexors. Discussed safe self ROM for patient to perform in her room.  Reviewed shoulder precautions.  Therapy Documentation Precautions:  Precautions Precautions: Fall Precaution Comments: aphasic Restrictions Weight Bearing Restrictions: No     Pain: Pain Assessment Pain Assessment: No/denies pain ADL:  See FIM for current functional status  Therapy/Group: Individual Therapy  Margaret Hess 07/01/2012, 12:12 PM

## 2012-07-01 NOTE — Progress Notes (Signed)
Patient ID: Margaret Hess, female   DOB: 03/25/1966, 46 y.o.   MRN: [id number]  Subjective/Complaints: "nerve pain" points to L side, improved, no shoulder pain.  Y/N still inaccurate but pt self correctsReview of Systems  Unable to perform ROS: medical condition   Objective: Vital Signs: Blood pressure 110/59, pulse 91, temperature 98.5 F (36.9 C), temperature source Oral, resp. rate 18, height 5\' 6"  (1.676 m), weight 81.1 kg (178 lb 12.7 oz), SpO2 99.00%. No results found. Results for orders placed during the hospital encounter of 06/25/12 (from the past 72 hour(s))  GLUCOSE, CAPILLARY     Status: Abnormal   Collection Time   06/28/12 11:33 AM      Component Value Range Comment   Glucose-Capillary 222 (*) 70 - 99 mg/dL    Comment 1 Notify RN     GLUCOSE, CAPILLARY     Status: Abnormal   Collection Time   06/28/12  4:55 PM      Component Value Range Comment   Glucose-Capillary 218 (*) 70 - 99 mg/dL   GLUCOSE, CAPILLARY     Status: Abnormal   Collection Time   06/28/12  9:06 PM      Component Value Range Comment   Glucose-Capillary 226 (*) 70 - 99 mg/dL    Comment 1 Notify RN     GLUCOSE, CAPILLARY     Status: Abnormal   Collection Time   06/29/12  7:45 AM      Component Value Range Comment   Glucose-Capillary 169 (*) 70 - 99 mg/dL    Comment 1 Notify RN     PROTIME-INR     Status: Abnormal   Collection Time   06/29/12  9:36 AM      Component Value Range Comment   Prothrombin Time 23.1 (*) 11.6 - 15.2 seconds    INR 2.15 (*) 0.00 - 1.49   GLUCOSE, CAPILLARY     Status: Abnormal   Collection Time   06/29/12 11:59 AM      Component Value Range Comment   Glucose-Capillary 210 (*) 70 - 99 mg/dL    Comment 1 Notify RN     GLUCOSE, CAPILLARY     Status: Abnormal   Collection Time   06/29/12  4:18 PM      Component Value Range Comment   Glucose-Capillary 164 (*) 70 - 99 mg/dL   GLUCOSE, CAPILLARY     Status: Abnormal   Collection Time   06/29/12  8:58 PM   Component Value Range Comment   Glucose-Capillary 165 (*) 70 - 99 mg/dL   PROTIME-INR     Status: Abnormal   Collection Time   06/30/12  6:35 AM      Component Value Range Comment   Prothrombin Time 24.7 (*) 11.6 - 15.2 seconds    INR 2.35 (*) 0.00 - 1.49   GLUCOSE, CAPILLARY     Status: Abnormal   Collection Time   06/30/12  7:13 AM      Component Value Range Comment   Glucose-Capillary 171 (*) 70 - 99 mg/dL    Comment 1 Notify RN     GLUCOSE, CAPILLARY     Status: Abnormal   Collection Time   06/30/12 11:21 AM      Component Value Range Comment   Glucose-Capillary 200 (*) 70 - 99 mg/dL   GLUCOSE, CAPILLARY     Status: Abnormal   Collection Time   06/30/12  4:46 PM      Component Value Range Comment  Glucose-Capillary 125 (*) 70 - 99 mg/dL   GLUCOSE, CAPILLARY     Status: Abnormal   Collection Time   06/30/12  8:43 PM      Component Value Range Comment   Glucose-Capillary 155 (*) 70 - 99 mg/dL   PROTIME-INR     Status: Abnormal   Collection Time   07/01/12  6:15 AM      Component Value Range Comment   Prothrombin Time 24.3 (*) 11.6 - 15.2 seconds    INR 2.30 (*) 0.00 - 1.49   BASIC METABOLIC PANEL     Status: Abnormal   Collection Time   07/01/12  6:15 AM      Component Value Range Comment   Sodium 137  135 - 145 mEq/L    Potassium 4.2  3.5 - 5.1 mEq/L    Chloride 99  96 - 112 mEq/L    CO2 26  19 - 32 mEq/L    Glucose, Bld 179 (*) 70 - 99 mg/dL    BUN 19  6 - 23 mg/dL    Creatinine, Ser 6.29  0.50 - 1.10 mg/dL    Calcium 52.8  8.4 - 10.5 mg/dL    GFR calc non Af Amer >90  >90 mL/min    GFR calc Af Amer >90  >90 mL/min   GLUCOSE, CAPILLARY     Status: Abnormal   Collection Time   07/01/12  7:36 AM      Component Value Range Comment   Glucose-Capillary 172 (*) 70 - 99 mg/dL      HEENT: normal Cardio: RRR Resp: CTA B/L GI: BS positive Extremity:  No Edema Skin:   Intact Neuro: Abnormal Sensory absent light touch and proprio, positive deep pain, Abnormal  Motor 0/5 in LUE and LLE, Abnormal FMC Tone  reduced on L side, Tone:  Hypotonia, Aphasic, Inattention and Apraxic Musc/Skel:  Swelling L dorsum hand ecchymosis and swelling-mild   Assessment/Plan: 1. Functional deficits secondary to R MCA infarct L HP, crossed aphasia,apraxia  which require 3+ hours per day of interdisciplinary therapy in a comprehensive inpatient rehab setting. Physiatrist is providing close team supervision and 24 hour management of active medical problems listed below. Physiatrist and rehab team continue to assess barriers to discharge/monitor patient progress toward functional and medical goals. FIM: FIM - Bathing Bathing Steps Patient Completed: Chest;Left Arm;Abdomen;Front perineal area;Left upper leg;Right upper leg Bathing: 3: Mod-Patient completes 5-7 22f 10 parts or 50-74%  FIM - Upper Body Dressing/Undressing Upper body dressing/undressing steps patient completed: Put head through opening of pull over shirt/dress;Thread/unthread right bra strap;Thread/unthread right sleeve of pullover shirt/dresss Upper body dressing/undressing: 2: Max-Patient completed 25-49% of tasks FIM - Lower Body Dressing/Undressing Lower body dressing/undressing steps patient completed: Don/Doff right sock;Don/Doff left sock;Don/Doff right shoe;Don/Doff left shoe;Thread/unthread right pants leg Lower body dressing/undressing: 2: Max-Patient completed 25-49% of tasks  FIM - Toileting Toileting steps completed by patient: Performs perineal hygiene Toileting Assistive Devices: Grab bar or rail for support Toileting: 2: Max-Patient completed 1 of 3 steps  FIM - Diplomatic Services operational officer Devices: Therapist, music Transfers: 1-Two helpers  FIM - Banker Devices: Bed rails Bed/Chair Transfer: 1: Two helpers  FIM - Locomotion: Wheelchair Locomotion: Wheelchair: 2: Travels 50 - 149 ft with supervision, cueing or coaxing FIM -  Locomotion: Ambulation Locomotion: Ambulation: 0: Activity did not occur  Comprehension Comprehension Mode: Auditory Comprehension: 4-Understands basic 75 - 89% of the time/requires cueing 10 - 24% of the time  Expression Expression Mode: Verbal Expression: 3-Expresses basic 50 - 74% of the time/requires cueing 25 - 50% of the time. Needs to repeat parts of sentences.  Social Interaction Social Interaction Mode: Asleep Social Interaction: 5-Interacts appropriately 90% of the time - Needs monitoring or encouragement for participation or interaction.  Problem Solving Problem Solving Mode: Asleep Problem Solving: 4-Solves basic 75 - 89% of the time/requires cueing 10 - 24% of the time  Memory Memory Mode: Asleep Memory: 4-Recognizes or recalls 75 - 89% of the time/requires cueing 10 - 24% of the time  Medical Problem List and Plan:  1. 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 DVT Prophylaxis/Anticoagulation: Chronic Coumadin therapy with a goal INR of 2-2.5 secondary hemorrhagic transformation  3. Neuropsych: This patient is not capable of making decisions on his/her own behalf.  4. 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 Lisinopril 2.5 mg twice a day, Lanoxin 0.125 mg daily. Monitor with increased mobility. Cardiology to follow up as needed(Dr. Bensimhon)  5. Diabetes mellitus. Patient currently on sliding scale insulin. Patient on Glucophage 500 mg twice a day prior to admission and victoza 1.2 mL subcutaneously daily. Check blood sugars a.c. and at bedtime increase oral agents metformin today.  If this fails then restart Victoza 6. Mood/depression. Patient on Wellbutrin prior to admission 75 mg daily. We'll discuss resuming this medication. Provide emotional support and positive reinforcement  7. Sensory dysesthesia related to parietal infarct and hemorrhagic transformation, will increase  gabapentin- appears effective at current dose 8.  Back pain mild/mod will use K pad and analgesic balm   LOS (Days) 6 A FACE TO FACE EVALUATION WAS PERFORMED  KIRSTEINS,ANDREW E 07/01/2012, 8:14 AM

## 2012-07-01 NOTE — Progress Notes (Signed)
ANTICOAGULATION CONSULT NOTE - Follow Up Consult  Pharmacy Consult for Coumadin Indication: LV thrombus  No Known Allergies  Patient Measurements: Height: 5\' 6"  (167.6 cm) Weight: 178 lb 12.7 oz (81.1 kg) IBW/kg (Calculated) : 59.3   Vital Signs: Temp: 98.5 F (36.9 C) (10/30 0626) Temp src: Oral (10/30 0626) BP: 98/67 mmHg (10/30 0949) Pulse Rate: 103  (10/30 0949)  Labs:  Basename 07/01/12 0615 06/30/12 0635 06/29/12 0936  HGB -- -- --  HCT -- -- --  PLT -- -- --  APTT -- -- --  LABPROT 24.3* 24.7* 23.1*  INR 2.30* 2.35* 2.15*  HEPARINUNFRC -- -- --  CREATININE 0.53 -- --  CKTOTAL -- -- --  CKMB -- -- --  TROPONINI -- -- --    Estimated Creatinine Clearance: 94.3 ml/min (by C-G formula based on Cr of 0.53).  Assessment: 45yof discharged from [facility] on 10/18 on coumadin 5mg  daily and lovenox 80mg  q12 for new LV thrombus. INR 2.15 at time of discharge after only 2 doses of coumadin. Returns to the hospital that same night with difficulty speaking and L sided weakness. 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   Coumadin continued (ok by neuro) at home dose of 5mg  daily. INR continues to be therapeutic on 5mg  daily regimen. Will continue to titrate warfarin conservatively given recent stroke. If INR continues to increase in am will reduce dose, if stable will consider MWF INR checks.  Goal of Therapy:  INR 2-2.5 (lower goal secondary to hemorrhagic transformation of CVA)    Plan:  1) Continue with Coumadin 5mg  today 2) Decrease INR checks to M-W-F  [name], Pharm.D., BCPS Clinical Pharmacist  Phone [phone] Pager [phone] 07/01/2012, 1:08 PM

## 2012-07-01 NOTE — Progress Notes (Signed)
Physical Therapy Session Note  Patient Details  Name: Margaret Hess MRN: [id number] Date of Birth: [birth year]  Today's Date: 07/01/2012 Time: 0805-0900 Time Calculation (min): 55 min  Short Term Goals: Week 1:  PT Short Term Goal 1 (Week 1): Patient will peform rolling bilaterally with mod assist PT Short Term Goal 2 (Week 1): Patient will perform bed <> w/c stand-pivot transfers with max assist PT Short Term Goal 3 (Week 1): Patient will propel w/c with R hemi technique with mod assist  Skilled Therapeutic Interventions/Progress Updates:   neuromuscular re-education via demo, forced use, VCs, tactile cues for:  -bed mobility- bil bridging, L heel slide, bil lower trunk rotation, bil hip flexion in sidelying; after repetition, rolling R with min assist, supine> sit with min/mod assist -standing- = wt bearing, L LE advancement, L stance stability  -sitting- removing L foot from footrest prior to transfer -gait in parallel bars- focusing on L stance stability, x 4' forward and backward, with max assist  Bed> w/c stand pivot transfer with mod/max assist.  W/c mobility using hemi technique with RUE and RLE x 150' with min assist     Therapy Documentation Precautions:  Precautions Precautions: Fall Precaution Comments: aphasic Restrictions Weight Bearing Restrictions: No Pain: Pain Assessment Pain Assessment: No/denies pain        See FIM for current functional status  Therapy/Group: Individual Therapy  Florrie Ramires 07/01/2012, 11:25 AM

## 2012-07-02 ENCOUNTER — Inpatient Hospital Stay (HOSPITAL_COMMUNITY): Payer: BC Managed Care – PPO | Admitting: Speech Pathology

## 2012-07-02 ENCOUNTER — Inpatient Hospital Stay (HOSPITAL_COMMUNITY): Payer: BC Managed Care – PPO | Admitting: Occupational Therapy

## 2012-07-02 ENCOUNTER — Inpatient Hospital Stay (HOSPITAL_COMMUNITY): Payer: BC Managed Care – PPO

## 2012-07-02 LAB — GLUCOSE, CAPILLARY: Glucose-Capillary: 192 mg/dL — ABNORMAL HIGH (ref 70–99)

## 2012-07-02 NOTE — Progress Notes (Signed)
Patient ID: Margaret Hess, female   DOB: 09-20-1965, 46 y.o.   MRN: [id number]  Subjective/Complaints: "nerve pain" may be better , no shoulder pain.  Y/N still inaccurate but pt self correctsReview of Systems  Unable to perform ROS: medical condition   Objective: Vital Signs: Blood pressure 102/68, pulse 86, temperature 98.6 F (37 C), temperature source Oral, resp. rate 16, height 5\' 6"  (1.676 m), weight 81.1 kg (178 lb 12.7 oz), SpO2 98.00%. No results found. Results for orders placed during the hospital encounter of 06/25/12 (from the past 72 hour(s))  PROTIME-INR     Status: Abnormal   Collection Time   06/29/12  9:36 AM      Component Value Range Comment   Prothrombin Time 23.1 (*) 11.6 - 15.2 seconds    INR 2.15 (*) 0.00 - 1.49   GLUCOSE, CAPILLARY     Status: Abnormal   Collection Time   06/29/12 11:59 AM      Component Value Range Comment   Glucose-Capillary 210 (*) 70 - 99 mg/dL    Comment 1 Notify RN     GLUCOSE, CAPILLARY     Status: Abnormal   Collection Time   06/29/12  4:18 PM      Component Value Range Comment   Glucose-Capillary 164 (*) 70 - 99 mg/dL   GLUCOSE, CAPILLARY     Status: Abnormal   Collection Time   06/29/12  8:58 PM      Component Value Range Comment   Glucose-Capillary 165 (*) 70 - 99 mg/dL   PROTIME-INR     Status: Abnormal   Collection Time   06/30/12  6:35 AM      Component Value Range Comment   Prothrombin Time 24.7 (*) 11.6 - 15.2 seconds    INR 2.35 (*) 0.00 - 1.49   GLUCOSE, CAPILLARY     Status: Abnormal   Collection Time   06/30/12  7:13 AM      Component Value Range Comment   Glucose-Capillary 171 (*) 70 - 99 mg/dL    Comment 1 Notify RN     GLUCOSE, CAPILLARY     Status: Abnormal   Collection Time   06/30/12 11:21 AM      Component Value Range Comment   Glucose-Capillary 200 (*) 70 - 99 mg/dL   GLUCOSE, CAPILLARY     Status: Abnormal   Collection Time   06/30/12  4:46 PM      Component Value Range Comment   Glucose-Capillary 125 (*) 70 - 99 mg/dL   GLUCOSE, CAPILLARY     Status: Abnormal   Collection Time   06/30/12  8:43 PM      Component Value Range Comment   Glucose-Capillary 155 (*) 70 - 99 mg/dL   PROTIME-INR     Status: Abnormal   Collection Time   07/01/12  6:15 AM      Component Value Range Comment   Prothrombin Time 24.3 (*) 11.6 - 15.2 seconds    INR 2.30 (*) 0.00 - 1.49   BASIC METABOLIC PANEL     Status: Abnormal   Collection Time   07/01/12  6:15 AM      Component Value Range Comment   Sodium 137  135 - 145 mEq/L    Potassium 4.2  3.5 - 5.1 mEq/L    Chloride 99  96 - 112 mEq/L    CO2 26  19 - 32 mEq/L    Glucose, Bld 179 (*) 70 - 99 mg/dL  BUN 19  6 - 23 mg/dL    Creatinine, Ser 1.61  0.50 - 1.10 mg/dL    Calcium 09.6  8.4 - 10.5 mg/dL    GFR calc non Af Amer >90  >90 mL/min    GFR calc Af Amer >90  >90 mL/min   GLUCOSE, CAPILLARY     Status: Abnormal   Collection Time   07/01/12  7:36 AM      Component Value Range Comment   Glucose-Capillary 172 (*) 70 - 99 mg/dL   GLUCOSE, CAPILLARY     Status: Abnormal   Collection Time   07/01/12 11:59 AM      Component Value Range Comment   Glucose-Capillary 154 (*) 70 - 99 mg/dL    Comment 1 Notify RN     GLUCOSE, CAPILLARY     Status: Normal   Collection Time   07/01/12  4:47 PM      Component Value Range Comment   Glucose-Capillary 98  70 - 99 mg/dL   GLUCOSE, CAPILLARY     Status: Abnormal   Collection Time   07/01/12  8:57 PM      Component Value Range Comment   Glucose-Capillary 149 (*) 70 - 99 mg/dL   GLUCOSE, CAPILLARY     Status: Abnormal   Collection Time   07/02/12  7:08 AM      Component Value Range Comment   Glucose-Capillary 174 (*) 70 - 99 mg/dL    Comment 1 Notify RN        HEENT: normal Cardio: RRR Resp: CTA B/L GI: BS positive Extremity:  No Edema Skin:   Intact Neuro: Abnormal Sensory absent light touch and proprio, positive deep pain, Abnormal Motor 0/5 in LUE and LLE, Abnormal FMC Tone   reduced on L side, Tone:  Hypotonia, Aphasic, Inattention and Apraxic Musc/Skel:  Swelling L dorsum hand ecchymosis and swelling-mild Increased L hamstrings and hip adductor tone  Assessment/Plan: 1. Functional deficits secondary to R MCA infarct L HP, crossed aphasia,apraxia  which require 3+ hours per day of interdisciplinary therapy in a comprehensive inpatient rehab setting. Physiatrist is providing close team supervision and 24 hour management of active medical problems listed below. Physiatrist and rehab team continue to assess barriers to discharge/monitor patient progress toward functional and medical goals. FIM: FIM - Bathing Bathing Steps Patient Completed: Chest;Left Arm;Abdomen;Front perineal area;Left upper leg;Right upper leg Bathing: 3: Mod-Patient completes 5-7 57f 10 parts or 50-74%  FIM - Upper Body Dressing/Undressing Upper body dressing/undressing steps patient completed: Thread/unthread right sleeve of pullover shirt/dresss;Pull shirt over trunk;Put head through opening of pull over shirt/dress Upper body dressing/undressing: 4: Min-Patient completed 75 plus % of tasks FIM - Lower Body Dressing/Undressing Lower body dressing/undressing steps patient completed: Thread/unthread right pants leg;Thread/unthread left pants leg Lower body dressing/undressing: 2: Max-Patient completed 25-49% of tasks  FIM - Toileting Toileting steps completed by patient: Performs perineal hygiene Toileting Assistive Devices: Grab bar or rail for support Toileting: 2: Max-Patient completed 1 of 3 steps  FIM - Diplomatic Services operational officer Devices: Grab bars Toilet Transfers: 3-To toilet/BSC: Mod A (lift or lower assist);3-From toilet/BSC: Mod A (lift or lower assist)  FIM - Press photographer Assistive Devices: Arm rests Bed/Chair Transfer: 3: Bed > Chair or W/C: Mod A (lift or lower assist)  FIM - Locomotion: Wheelchair Locomotion: Wheelchair: 4:  Travels 150 ft or more: maneuvers on rugs and over door sillls with minimal assistance (Pt.>75%) FIM - Locomotion: Ambulation Locomotion: Ambulation Assistive  Devices: Parallel bars Locomotion: Ambulation: 1: Travels less than 50 ft with total assistance/helper does all (Pt.<25%)  Comprehension Comprehension Mode: Auditory Comprehension: 5-Understands complex 90% of the time/Cues < 10% of the time  Expression Expression Mode: Verbal Expression: 5-Expresses basic 90% of the time/requires cueing < 10% of the time.  Social Interaction Social Interaction Mode: Asleep Social Interaction: 5-Interacts appropriately 90% of the time - Needs monitoring or encouragement for participation or interaction.  Problem Solving Problem Solving Mode: Asleep Problem Solving: 4-Solves basic 75 - 89% of the time/requires cueing 10 - 24% of the time  Memory Memory Mode: Asleep Memory: 5-Recognizes or recalls 90% of the time/requires cueing < 10% of the time  Medical Problem List and Plan:  1. 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 DVT Prophylaxis/Anticoagulation: Chronic Coumadin therapy with a goal INR of 2-2.5 secondary hemorrhagic transformation  3. Neuropsych: This patient is not capable of making decisions on his/her own behalf.  4. 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 Lisinopril 2.5 mg twice a day, Lanoxin 0.125 mg daily. Monitor with increased mobility. Cardiology to follow up as needed(Dr. Bensimhon)  5. Diabetes mellitus. Patient currently on sliding scale insulin. Patient on Glucophage 500 mg twice a day prior to admission and victoza 1.2 mL subcutaneously daily. Check blood sugars a.c. and at bedtime increase oral agents metformin today.  If this fails then restart Victoza 6. Mood/depression. Patient on Wellbutrin prior to admission 75 mg daily. We'll discuss resuming this medication. Provide emotional  support and positive reinforcement  7. Sensory dysesthesia related to parietal infarct and hemorrhagic transformation, will increase gabapentin- appears effective at current dose 8.  Back pain mild/mod will use K pad and analgesic balm   LOS (Days) 7 A FACE TO FACE EVALUATION WAS PERFORMED  KIRSTEINS,ANDREW E 07/02/2012, 8:08 AM

## 2012-07-02 NOTE — Progress Notes (Signed)
Social Work Patient ID: Margaret Hess, female   DOB: [birth year], 46 y.o.   MRN: [id number] Healthcare POA and Living Will completed with sister and friends with notorary present.

## 2012-07-02 NOTE — Progress Notes (Signed)
Physical Therapy Weekly Progress Note  Patient Details  Name: Margaret Hess MRN: [id number] Date of Birth: 07-06-1966  Today's Date: 07/02/2012 Time: 0800-0855 Time Calculation (min): 55 min  Patient has met 3 of 3 short term goals.    Patient continues to demonstrate the following deficits: strength, awareness, balance, muscle activation and timing and therefore will continue to benefit from skilled PT intervention to enhance overall performance with activity tolerance, balance, postural control, ability to compensate for deficits, functional use of  left upper extremity and left lower extremity and awareness.  Patient progressing toward long term goals..  Continue plan of care.  PT Short Term Goals Week 1:  PT Short Term Goal 1 (Week 1): Patient will peform rolling bilaterally with mod assist PT Short Term Goal 1 - Progress (Week 1): Met PT Short Term Goal 2 (Week 1): Patient will perform bed <> w/c stand-pivot transfers with max assist PT Short Term Goal 2 - Progress (Week 1): Met PT Short Term Goal 3 (Week 1): Patient will propel w/c with R hemi technique with mod assist PT Short Term Goal 3 - Progress (Week 1): Met      Skilled Therapeutic Interventions/Progress Updates:   W/c mobility x 100' with supervision for route finding and staying on r side of hall, using hemi technique with RUe and RLE.  neuromuscular re-education via demo, tactile and VCs, forced use for:  Bed mobility- rolling R and sit with min assist for LLE; stand/pivot to w/c to L with mod assist. Static standing in stander, working on = wt bearing, midline orientation, while sorting cards by suit, counting cards, also working with sling loosended for terminal hip extension Dynamic standing in stander, reaching across midline to L with R hand for objects,  to increase LLE wt bearing and attention to L  After standing x 20 minutes, pt stated "dizzy", and was quickly seated by therapist.  Pt anxious; therapist  reassured.  BP taken.  Pt returned to room, transferred to bed L stand pivot with max assist. Sit> supine with min assist LLE.  NT contacted about pt's dizziness.     Therapy Documentation Precautions:  Precautions Precautions: Fall Precaution Comments: aphasic Restrictions Weight Bearing Restrictions: No   Vital Signs: Therapy Vitals Pulse Rate: 101  BP: 108/76 mmHg Patient Position, if appropriate: sitting, after standing x 20 minutes and c/o dizziness      See FIM for current functional status  Therapy/Group: Individual Therapy  Schneur Crowson 07/02/2012, 4:13 PM

## 2012-07-02 NOTE — Progress Notes (Signed)
Speech Language Pathology Daily Session Note  Patient Details  Name: Margaret Hess MRN: [id number] Date of Birth: June 03, 1966  Today's Date: 07/02/2012 Time: 1450-1550 Time Calculation (min): 60 min  Short Term Goals: Week 2: SLP Short Term Goal 1 (Week 2): Patient will consume regular textures and thin liquids with intermittent supervision cues and no overt s/s of aspiration. SLP Short Term Goal 2 (Week 2): Patient will verbally complete automatic sequences with supervision level assist phonemic cues. SLP Short Term Goal 3 (Week 2): Patient will verbally produce phrase level (3-4 word) utterances to describe situations, pictures, etc. with mod assist verbal and written cues. SLP Short Term Goal 4 (Week 2): Patient will self monitor accuracy of verbal expression with mod assist verbal and non-verbal cues. SLP Short Term Goal 5 (Week 2): Patient will write by copying basic, biographical information with mod assist multi-modal cues to self monitor and correct errors.  Skilled Therapeutic Interventions: Treatment session focused on addressing verbal and written language goals; SLP facilitated session with written phrases and pictures (3 words) with 80% accurate production of articles in verbal expression and mod assist verbal and non-verbal cues to self-monitor errors and correct. SLP facilitated biographical writing task with model word to trace and supervision level cues to self-monitor errors with min assist level cues to correct. Patient also demonstrated improvement with automatic verbal sequences and with the same information presented in a non-sequential format.   FIM:  Comprehension Comprehension Mode: Auditory Comprehension: 5-Follows basic conversation/direction: With extra time/assistive device Expression Expression Mode: Verbal Expression: 4-Expresses basic 75 - 89% of the time/requires cueing 10 - 24% of the time. Needs helper to occlude trach/needs to repeat words. Social  Interaction Social Interaction: 5-Interacts appropriately 90% of the time - Needs monitoring or encouragement for participation or interaction. Problem Solving Problem Solving: 5-Solves basic 90% of the time/requires cueing < 10% of the time Memory Memory: 5-Recognizes or recalls 90% of the time/requires cueing < 10% of the time FIM - Eating Eating Activity: 5: Set-up assist for open containers  Pain Pain Assessment Pain Assessment: No/denies pain  Therapy/Group: Individual Therapy  Charlane Ferretti., CCC-SLP [phone]  Margaret Hess 07/02/2012, 4:50 PM

## 2012-07-02 NOTE — Patient Care Conference (Signed)
Inpatient RehabilitationTeam Conference Note Date: 07/01/2012   Time: 10:30 AM    Patient Name: Margaret Hess      Medical Record Number: [id number]  Date of Birth: 10-Mar-1966 Sex: Female         Room/Bed: 4033/4033-01 Payor Info: Payor: [employer]UE [employer]  Plan: [employer] PPO  Product Type: *No Product type*     Admitting Diagnosis: R BKA  Admit Date/Time:  06/25/2012  3:56 PM Admission Comments: No comment available   Primary Diagnosis:  CVA (cerebral infarction) Principal Problem: CVA (cerebral infarction)  Patient Active Problem List   Diagnosis Date Noted  . CVA (cerebral infarction) 06/26/2012  . Chronic systolic heart failure 06/23/2012  . Cardiomyopathy 06/21/2012  . Stroke, embolic 06/20/2012  . Nonischemic cardiomyopathy 06/17/2012  . LV (left ventricular) mural thrombus without MI 06/16/2012  . Chronic passive hepatic congestion 06/16/2012  . Acute respiratory failure with hypoxia 06/16/2012  . Positive D dimer 06/16/2012  . Abnormal coagulation profile 06/16/2012  . Diabetes mellitus 06/16/2012  . Leukocytosis 06/16/2012  . Pulmonary hypertension 06/16/2012  . Acute diastolic heart failure, NYHA class 3 06/16/2012  . Acute combined systolic and diastolic heart failure 06/16/2012  . Acute systolic CHF (congestive heart failure), NYHA class 4 06/15/2012  . HTN (hypertension) 06/15/2012  . Anxiety 06/15/2012  . Moderate Bilateral pleural effusion 06/15/2012  . Dyspnea on exertion 06/15/2012    Expected Discharge Date: Expected Discharge Date: 07/23/12  Team Members Present: Physician: Dr. Claudette Laws Social Worker Present: Dossie Der, LCSW Nurse Present: Other (comment) Milly Jakob Rcom-RN) PT Present: Edman Circle, Lillie Columbia, PT OT Present: Leonette Monarch, OT SLP Present: Fae Pippin, SLP Other (Discipline and Name): Charolette Child Coordinator     Current Status/Progress Goal Weekly Team Focus  Medical   Developing flexor tone in  LLE, Apraxia, Mixed aphasia  Neuromuscular re education, improve receptive and exp language  Adjust meds for neuropathic pain   Bowel/Bladder   Continent of bowel and bladder         Swallow/Nutrition/ Hydration   regular textures, thin liquids full supervision  least restrictive p.o. intake  Mod I utilization of compensatory strategies for precautions   ADL's   mod assist UB dressing,  bathing; max LB dressing and toileting, mod assist squat pivot, no active movement LUE  min bathing, LB dressing, toileting, supervision UB dressing, min transfers  ADL retraining, LUE neuro re-ed, functional mobility   Mobility   mod assist transfers, supervision w/c x 75', pre-gait- no active motors elicited yet in LLE  min assist transfers; supervision w/c mobility x 150',  mod assist gait x 50' and up/down 4 steps  transfers, w/c propulsion, LLE neuro re-ed   Communication   Mod A  Min A  verbal expression of wants and needs   Safety/Cognition/ Behavioral Observations  Mod A  supervision  increased self monitoring and correcting of errors during verbal expressin   Pain   Mid back pain (4 out 10) Scheduled neurontin 3x daily PRN tylenol 325-650mg   pain level < or = 4  Monitor and treat pain as needed   Skin   bruise to abdomen and left arm  no new skin breakdown or infection  Monitor and education on turning      *See Interdisciplinary Assessment and Plan and progress notes for long and short-term goals  Barriers to Discharge: will need 24/7 care    Possible Resolutions to Barriers:  sister is moving to [facility]    Discharge Planning/Teaching  Needs:  Home with sister who is here to assist and friends to also assist      Team Discussion:  Making good progress, sister here to observe in therapies. Regular-thin diet Somewhat impulsive working on this.  Revisions to Treatment Plan:  None   Continued Need for Acute Rehabilitation Level of Care: The patient requires daily medical management by  a physician with specialized training in physical medicine and rehabilitation for the following conditions: Daily direction of a multidisciplinary physical rehabilitation program to ensure safe treatment while eliciting the highest outcome that is of practical value to the patient.: Yes Daily medical management of patient stability for increased activity during participation in an intensive rehabilitation regime.: Yes Daily analysis of laboratory values and/or radiology reports with any subsequent need for medication adjustment of medical intervention for : Neurological problems  Lucy Chris 07/02/2012, 8:47 AM

## 2012-07-02 NOTE — Progress Notes (Signed)
Nursing Note:D: BP 90/60 and pulse 88 A: Held lisinopril that was due and notified Dan Anguilli,PA.No new orders obtained.wbb

## 2012-07-02 NOTE — Progress Notes (Signed)
Occupational Therapy Session Note  Patient Details  Name: Aundrea Horace MRN: [id number] Date of Birth: 1966/07/20  Today's Date: 07/02/2012 Time: 1000-1130 Time Calculation (min): 90 min  Short Term Goals: Week 1:  OT Short Term Goal 1 (Week 1): Pt will maintain dynamic sitting balance with mod assist during selfcare activities. OT Short Term Goal 2 (Week 1): Pt will position the LUE prior and after transitional movements with mod questioning cues. OT Short Term Goal 3 (Week 1): Pt will perform all bathing with mod assist sit to stand. OT Short Term Goal 4 (Week 1): Pt will perform UB dressing with min assist to donn pullover shirt. OT Short Term Goal 5 (Week 1): Pt will perform toilet transfers with mod assist to 3:1.  Skilled Therapeutic Interventions/Progress Updates:      Pt seen for BADL retraining of toileting, bathing, and dressing with a focus on sit to stand and static standing.  Pt complained of dizziness with standing, had patient work on visual fixation. Pt chose to bathe at sink level due to overall fatigue.  Pt did well this am with sit to stand activities demonstrating increased motor control of LLE. Pt was then seen in gym to work on LUE neuro re-ed with a/arom, weight bearing activities, and assisted grasping of cones.  Increased tone in elbow flexors.  Therapy Documentation Precautions:  Precautions Precautions: Fall Precaution Comments: aphasic Restrictions Weight Bearing Restrictions: No  Pain: Pain Assessment Pain Assessment: No/denies pain Faces Pain Scale: Hurts a little bit Pain Location: Back Pain Orientation: Lower Pain Intervention(s): Medication (See eMAR);RN made aware ADL:  See FIM for current functional status  Therapy/Group: Individual Therapy  Jacquie Lukes 07/02/2012, 12:17 PM

## 2012-07-02 NOTE — Progress Notes (Addendum)
Advanced Heart Failure Rounding Note   Subjective:    46 yo history of DM,  HTN, NICM,  EF <20%.  LV thrombus, and Grade 3 diastolic dysfunction.  Mod TR.   Discharged from [facility] 06/19/12 after being treated for acute systolic heart failure and LV thrombus. Discharged on loveneox and coumadin. HF medications.   Presented to ED 06/20/12 with difficulty speaking and L sided weakness. CT of head negative. 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 Working with rehab. LLE strength and expressive aphasia improving some.   Feeling well.  Continues to do well with rehab.  Eating breakfast on her own this morning.  Weight trending down.  Spiro 12.5 mg started yesterday.  .   Objective:     Vital Signs:   Temp:  [98.4 F (36.9 C)-98.6 F (37 C)] 98.6 F (37 C) (10/31 0602) Pulse Rate:  [84-103] 101  (10/31 0851) Resp:  [16-18] 16  (10/31 0602) BP: (98-111)/(58-76) 100/58 mmHg (10/31 0910) SpO2:  [98 %-99 %] 98 % (10/31 0602) Last BM Date: 06/29/12  Weight change: Filed Weights   06/29/12 0545 06/30/12 0700 07/01/12 0500  Weight: 77 kg (169 lb 12.1 oz) 82.5 kg (181 lb 14.1 oz) 81.1 kg (178 lb 12.7 oz)    Intake/Output:   Intake/Output Summary (Last 24 hours) at 07/02/12 0928 Last data filed at 07/02/12 0334  Gross per 24 hour  Intake    720 ml  Output      1 ml  Net    719 ml     Physical Exam:            General:  Well appearing. No resp difficulty sitting in WC HEENT: L facial droop Neck: supple. JVP flat.  Carotids 2+ bilat; no bruits. No lymphadenopathy or thryomegaly appreciated. Cor: PMI nondisplaced. Tachycardic No gallops, rubs, or murmurs. Lungs: clear Abdomen: soft, nontender, nondistended. No hepatosplenomegaly. No bruits or masses. Good bowel sounds. Extremities: no cyanosis, clubbing, rash, edema L arm flaccid Neuro: alert  & orientedx3, cranial nerves grossly intact. moves all 4 extremities w/o difficulty. Affect pleasant    Labs: Basic Metabolic Panel:  Lab 07/01/12 1610 06/26/12 0625  NA 137 135  K 4.2 4.3  CL 99 101  CO2 26 23  GLUCOSE 179* 167*  BUN 19 15  CREATININE 0.53 0.54  CALCIUM 10.1 8.9  MG -- --  PHOS -- --    Liver Function Tests:  Lab 06/26/12 0625  AST 25  ALT 34  ALKPHOS 49  BILITOT 0.3  PROT 7.0  ALBUMIN 2.6*   No results found for this basename: LIPASE:5,AMYLASE:5 in the last 168 hours No results found for this basename: AMMONIA:3 in the last 168 hours  CBC:  Lab 06/26/12 0625  WBC 12.8*  NEUTROABS 7.3  HGB 12.8  HCT 38.8  MCV 90.0  PLT 389    Cardiac Enzymes: No results found for this basename: CKTOTAL:5,CKMB:5,CKMBINDEX:5,TROPONINI:5 in the last 168 hours  BNP: BNP (last 3 results)  Basename 06/17/12 0455 06/15/12 1720  PROBNP 1930.0* 6932.0*       Imaging: No results found.   Medications:     Scheduled Medications:    . carvedilol  3.125 mg Oral BID WC  . digoxin  0.125 mg Oral Daily  . gabapentin  200 mg Oral TID  . influenza  inactive virus vaccine  0.5 mL Intramuscular  Tomorrow-1000  . insulin aspart  0-15 Units Subcutaneous TID WC  . lisinopril  5 mg Oral BID  . metFORMIN  1,000 mg Oral BID WC  . pantoprazole  40 mg Oral QHS  . spironolactone  12.5 mg Oral Daily  . warfarin  5 mg Oral q1800  . Warfarin - Pharmacist Dosing Inpatient   Does not apply q1800    Infusions:    PRN Medications: acetaminophen, albuterol, Muscle Rub, ondansetron (ZOFRAN) IV, ondansetron, polyethylene glycol, sorbitol   Assessment:   1. CVA, acute hemorrhagic R MCA    --c/b expressive aphasia and L-sided hemiparesis 2. 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 NICM, EF <20% 4.  LV thrombus  5. DM   6. Hypertension  Plan/Discussion:     Remains very stable from HF perspective. Volume status looks good despite weight  change.  Low dose spiro added yesterday.  Will follow up BMET and dig level tomorrow.  Will need to check echo prior to d/c to re-assess LV function.  Appreciate Rehab team's work.  Harold Barban 9:28 AM   Patient seen and examined with Ulyess Blossom, PA-C. We discussed all aspects of the encounter. I agree with the assessment and plan as stated above.   Doing very well from HF perspective. Weight up from baseline but no volume on exam. Agree with spiro. Check labs in am. Continue coumadin per pharmacy.   Appreciate Rehab's efforts.   Truman Hayward 4:34 PM

## 2012-07-03 ENCOUNTER — Inpatient Hospital Stay (HOSPITAL_COMMUNITY): Payer: BC Managed Care – PPO

## 2012-07-03 ENCOUNTER — Inpatient Hospital Stay (HOSPITAL_COMMUNITY): Payer: BC Managed Care – PPO | Admitting: Occupational Therapy

## 2012-07-03 ENCOUNTER — Inpatient Hospital Stay (HOSPITAL_COMMUNITY): Payer: BC Managed Care – PPO | Admitting: Speech Pathology

## 2012-07-03 DIAGNOSIS — I69991 Dysphagia following unspecified cerebrovascular disease: Secondary | ICD-10-CM

## 2012-07-03 DIAGNOSIS — E119 Type 2 diabetes mellitus without complications: Secondary | ICD-10-CM

## 2012-07-03 DIAGNOSIS — G8110 Spastic hemiplegia affecting unspecified side: Secondary | ICD-10-CM

## 2012-07-03 LAB — BASIC METABOLIC PANEL
BUN: 17 mg/dL (ref 6–23)
Calcium: 9.9 mg/dL (ref 8.4–10.5)
Creatinine, Ser: 0.45 mg/dL — ABNORMAL LOW (ref 0.50–1.10)
GFR calc Af Amer: >90 mL/min (ref >90)

## 2012-07-03 LAB — PROTIME-INR
INR: 2.99 — ABNORMAL HIGH (ref 0.00–1.49)
Prothrombin Time: 29.5 seconds — ABNORMAL HIGH (ref 11.6–15.2)

## 2012-07-03 LAB — GLUCOSE, CAPILLARY
Glucose-Capillary: 157 mg/dL — ABNORMAL HIGH (ref 70–99)
Glucose-Capillary: 125 mg/dL — ABNORMAL HIGH (ref 70–99)

## 2012-07-03 LAB — DIGOXIN LEVEL: Digoxin Level: 0.5 ng/mL — ABNORMAL LOW (ref 0.8–2.0)

## 2012-07-03 MED ORDER — PNEUMOCOCCAL VAC POLYVALENT 25 MCG/0.5ML IJ INJ
0.5000 mL | INJECTION | INTRAMUSCULAR | Status: AC
  Administered 2012-07-04: 0.5 mL via INTRAMUSCULAR
  Filled 2012-07-03: qty 0.5

## 2012-07-03 MED ORDER — WARFARIN SODIUM 2.5 MG PO TABS
2.5000 mg | ORAL_TABLET | Freq: Once | ORAL | Status: AC
  Administered 2012-07-03: 2.5 mg via ORAL
  Filled 2012-07-03: qty 1

## 2012-07-03 MED ORDER — INFLUENZA VIRUS VACC SPLIT PF IM SUSP
0.5000 mL | INTRAMUSCULAR | Status: AC
  Administered 2012-07-04: 0.5 mL via INTRAMUSCULAR
  Filled 2012-07-03: qty 0.5

## 2012-07-03 NOTE — Progress Notes (Signed)
Patient ID: Margaret Hess, female   DOB: [birth year], 46 y.o.   MRN: [id number]  Subjective/Complaints: "nerve pain" better , no shoulder pain.  Y/N still inaccurate but pt self correctsReview of Systems  Unable to perform ROS: medical condition   Objective: Vital Signs: Blood pressure 116/80, pulse 88, temperature 98.5 F (36.9 C), temperature source Oral, resp. rate 20, height 5\' 6"  (1.676 m), weight 79.1 kg (174 lb 6.1 oz), SpO2 96.00%. No results found. Results for orders placed during the hospital encounter of 06/25/12 (from the past 72 hour(s))  GLUCOSE, CAPILLARY     Status: Abnormal   Collection Time   06/30/12 11:21 AM      Component Value Range Comment   Glucose-Capillary 200 (*) 70 - 99 mg/dL   GLUCOSE, CAPILLARY     Status: Abnormal   Collection Time   06/30/12  4:46 PM      Component Value Range Comment   Glucose-Capillary 125 (*) 70 - 99 mg/dL   GLUCOSE, CAPILLARY     Status: Abnormal   Collection Time   06/30/12  8:43 PM      Component Value Range Comment   Glucose-Capillary 155 (*) 70 - 99 mg/dL   PROTIME-INR     Status: Abnormal   Collection Time   07/01/12  6:15 AM      Component Value Range Comment   Prothrombin Time 24.3 (*) 11.6 - 15.2 seconds    INR 2.30 (*) 0.00 - 1.49   BASIC METABOLIC PANEL     Status: Abnormal   Collection Time   07/01/12  6:15 AM      Component Value Range Comment   Sodium 137  135 - 145 mEq/L    Potassium 4.2  3.5 - 5.1 mEq/L    Chloride 99  96 - 112 mEq/L    CO2 26  19 - 32 mEq/L    Glucose, Bld 179 (*) 70 - 99 mg/dL    BUN 19  6 - 23 mg/dL    Creatinine, Ser 4.09  0.50 - 1.10 mg/dL    Calcium 81.1  8.4 - 10.5 mg/dL    GFR calc non Af Amer >90  >90 mL/min    GFR calc Af Amer >90  >90 mL/min   GLUCOSE, CAPILLARY     Status: Abnormal   Collection Time   07/01/12  7:36 AM      Component Value Range Comment   Glucose-Capillary 172 (*) 70 - 99 mg/dL   GLUCOSE, CAPILLARY     Status: Abnormal   Collection Time   07/01/12  11:59 AM      Component Value Range Comment   Glucose-Capillary 154 (*) 70 - 99 mg/dL    Comment 1 Notify RN     GLUCOSE, CAPILLARY     Status: Normal   Collection Time   07/01/12  4:47 PM      Component Value Range Comment   Glucose-Capillary 98  70 - 99 mg/dL   GLUCOSE, CAPILLARY     Status: Abnormal   Collection Time   07/01/12  8:57 PM      Component Value Range Comment   Glucose-Capillary 149 (*) 70 - 99 mg/dL   GLUCOSE, CAPILLARY     Status: Abnormal   Collection Time   07/02/12  7:08 AM      Component Value Range Comment   Glucose-Capillary 174 (*) 70 - 99 mg/dL    Comment 1 Notify RN     GLUCOSE, CAPILLARY  Status: Abnormal   Collection Time   07/02/12 11:36 AM      Component Value Range Comment   Glucose-Capillary 192 (*) 70 - 99 mg/dL    Comment 1 Notify RN     GLUCOSE, CAPILLARY     Status: Abnormal   Collection Time   07/02/12  4:46 PM      Component Value Range Comment   Glucose-Capillary 125 (*) 70 - 99 mg/dL   GLUCOSE, CAPILLARY     Status: Abnormal   Collection Time   07/02/12  9:38 PM      Component Value Range Comment   Glucose-Capillary 126 (*) 70 - 99 mg/dL   PROTIME-INR     Status: Abnormal   Collection Time   07/03/12  6:17 AM      Component Value Range Comment   Prothrombin Time 29.5 (*) 11.6 - 15.2 seconds    INR 2.99 (*) 0.00 - 1.49   BASIC METABOLIC PANEL     Status: Abnormal   Collection Time   07/03/12  6:17 AM      Component Value Range Comment   Sodium 132 (*) 135 - 145 mEq/L    Potassium 4.0  3.5 - 5.1 mEq/L    Chloride 94 (*) 96 - 112 mEq/L    CO2 24  19 - 32 mEq/L    Glucose, Bld 195 (*) 70 - 99 mg/dL    BUN 17  6 - 23 mg/dL    Creatinine, Ser 1.61 (*) 0.50 - 1.10 mg/dL    Calcium 9.9  8.4 - 09.6 mg/dL    GFR calc non Af Amer >90  >90 mL/min    GFR calc Af Amer >90  >90 mL/min   DIGOXIN LEVEL     Status: Abnormal   Collection Time   07/03/12  6:17 AM      Component Value Range Comment   Digoxin Level 0.5 (*) 0.8 - 2.0 ng/mL       HEENT: normal Cardio: RRR Resp: CTA B/L GI: BS positive Extremity:  No Edema Skin:   Intact Neuro: Abnormal Sensory absent light touch and proprio, positive deep pain, Abnormal Motor 0/5 in LUE and LLE, Abnormal FMC Tone  reduced on L side, Tone:  Hypotonia, Aphasic, Inattention and Apraxic Musc/Skel:  Swelling L dorsum hand ecchymosis and swelling-mild Increased L hamstrings and hip adductor tone  Assessment/Plan: 1. Functional deficits secondary to R MCA infarct L HP, crossed aphasia,apraxia  which require 3+ hours per day of interdisciplinary therapy in a comprehensive inpatient rehab setting. Physiatrist is providing close team supervision and 24 hour management of active medical problems listed below. Physiatrist and rehab team continue to assess barriers to discharge/monitor patient progress toward functional and medical goals. FIM: FIM - Bathing Bathing Steps Patient Completed: Chest;Left Arm;Abdomen;Front perineal area;Left upper leg;Right upper leg Bathing: 3: Mod-Patient completes 5-7 57f 10 parts or 50-74%  FIM - Upper Body Dressing/Undressing Upper body dressing/undressing steps patient completed: Thread/unthread right sleeve of pullover shirt/dresss;Pull shirt over trunk;Put head through opening of pull over shirt/dress Upper body dressing/undressing: 4: Min-Patient completed 75 plus % of tasks FIM - Lower Body Dressing/Undressing Lower body dressing/undressing steps patient completed: Thread/unthread right pants leg;Thread/unthread left pants leg Lower body dressing/undressing: 2: Max-Patient completed 25-49% of tasks  FIM - Toileting Toileting steps completed by patient: Performs perineal hygiene Toileting Assistive Devices: Grab bar or rail for support Toileting: 2: Max-Patient completed 1 of 3 steps  FIM - Diplomatic Services operational officer Devices:  Grab bars Toilet Transfers: 3-To toilet/BSC: Mod A (lift or lower assist);3-From toilet/BSC: Mod A (lift  or lower assist)  FIM - Bed/Chair Transfer Bed/Chair Transfer Assistive Devices: Arm rests Bed/Chair Transfer: 2: Chair or W/C > Bed: Max A (lift and lower assist);3: Bed > Chair or W/C: Mod A (lift or lower assist)  FIM - Locomotion: Wheelchair Locomotion: Wheelchair: 2: Travels 50 - 149 ft with supervision, cueing or coaxing FIM - Locomotion: Ambulation Locomotion: Ambulation Assistive Devices: Parallel bars Locomotion: Ambulation: 0: Activity did not occur  Comprehension Comprehension Mode: Auditory Comprehension: 3-Understands basic 50 - 74% of the time/requires cueing 25 - 50%  of the time  Expression Expression Mode: Verbal Expression: 4-Expresses basic 75 - 89% of the time/requires cueing 10 - 24% of the time. Needs helper to occlude trach/needs to repeat words.  Social Interaction Social Interaction Mode: Asleep Social Interaction: 4-Interacts appropriately 75 - 89% of the time - Needs redirection for appropriate language or to initiate interaction.  Problem Solving Problem Solving Mode: Asleep Problem Solving: 3-Solves basic 50 - 74% of the time/requires cueing 25 - 49% of the time  Memory Memory Mode: Asleep Memory: 3-Recognizes or recalls 50 - 74% of the time/requires cueing 25 - 49% of the time  Medical Problem List and Plan:  1. 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 DVT Prophylaxis/Anticoagulation: Chronic Coumadin therapy with a goal INR of 2-2.5 secondary hemorrhagic transformation  3. Neuropsych: This patient is not capable of making decisions on his/her own behalf.  4. 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 Lisinopril 2.5 mg twice a day, Lanoxin 0.125 mg daily. Monitor with increased mobility. Cardiology to follow up as needed(Dr. Bensimhon)  5. Diabetes mellitus. Patient currently on sliding scale insulin. Patient on Glucophage 500 mg twice a day prior to admission  and victoza 1.2 mL subcutaneously daily. Check blood sugars a.c. and at bedtime increase oral agents metformin today.  If this fails then restart Victoza 6. Mood/depression. Patient on Wellbutrin prior to admission 75 mg daily. We'll discuss resuming this medication. Provide emotional support and positive reinforcement  7. Sensory dysesthesia related to parietal infarct and hemorrhagic transformation, will increase gabapentin- appears effective at current dose 8.  Back pain mild/mod will use K pad and analgesic balm   LOS (Days) 8 A FACE TO FACE EVALUATION WAS PERFORMED  Nyimah Shadduck E 07/03/2012, 8:03 AM

## 2012-07-03 NOTE — Progress Notes (Signed)
Occupational Therapy Session Note  Patient Details  Name: Margaret Hess MRN: [id number] Date of Birth: 06/12/1966  Today's Date: 07/03/2012 Time: 1000-1100 Time Calculation (min): 60 min  Short Term Goals: Week 1:  OT Short Term Goal 1 (Week 1): Pt will maintain dynamic sitting balance with mod assist during selfcare activities. OT Short Term Goal 1 - Progress (Week 1): Met OT Short Term Goal 2 (Week 1): Pt will position the LUE prior and after transitional movements with mod questioning cues. OT Short Term Goal 2 - Progress (Week 1): Met OT Short Term Goal 3 (Week 1): Pt will perform all bathing with mod assist sit to stand. OT Short Term Goal 3 - Progress (Week 1): Met OT Short Term Goal 4 (Week 1): Pt will perform UB dressing with min assist to donn pullover shirt. OT Short Term Goal 4 - Progress (Week 1): Met OT Short Term Goal 5 (Week 1): Pt will perform toilet transfers with mod assist to 3:1.   OT Short Term Goal 1 (Week 2): Pt will be able to stand at sink with min assist to allow her to pull up her pants with min assist. OT Short Term Goal 2 (Week 2): Pt will bathe in shower with steady assist using long sponge. OT Short Term Goal 3 (Week 2): Pt will perform self ROM to LUE with min verbal cues. OT Short Term Goal 4 (Week 2): Pt will transfer to toilet with min assist.  Skilled Therapeutic Interventions/Progress Updates:    Pt seen for BADL retraining of toileting, bathing, and dressing with a focus on tub bench transfers in shower, dynamic sitting balance with reaching to left, sit to stand at the sink, and LUE management.  Pt was not feeling well this am, but was able to participate well.  She demonstrated increased trunk control with transfers.  In sit to stands at toilet and at sink her LLE tone increases and the foot actually lifts off the floor.  She needs facilitation through knee to stabilize leg.  Pt tends to lean to right and needs support to weight shift to  left.  Therapy Documentation Precautions:  Precautions Precautions: Fall Precaution Comments: aphasic Restrictions Weight Bearing Restrictions: No  Pain: Pain Assessment Pain Assessment: No/denies pain ADL:  See FIM for current functional status  Therapy/Group: Individual Therapy  Brendolyn Stockley 07/03/2012, 12:20 PM

## 2012-07-03 NOTE — Progress Notes (Signed)
Occupational Therapy Weekly Progress Note  Patient Details  Name: Margaret Hess MRN: [id number] Date of Birth: [birth year]  Today's Date: 07/03/2012   Patient has met 4 of 4 short term goals.  Pt has been making progress in all areas of self care and functional mobility.  Patient continues to demonstrate the following deficits: left hemiplegia and sensory deficits, decreased trunk control and balance in sitting and standing, and expressive aphasia and therefore will continue to benefit from skilled OT intervention to enhance overall performance with BADL.  Patient progressing toward long term goals.  Continue plan of care.  OT Short Term Goals Week 1:  OT Short Term Goal 1 (Week 1): Pt will maintain dynamic sitting balance with mod assist during selfcare activities. OT Short Term Goal 1 - Progress (Week 1): Met OT Short Term Goal 2 (Week 1): Pt will position the LUE prior and after transitional movements with mod questioning cues. OT Short Term Goal 2 - Progress (Week 1): Met OT Short Term Goal 3 (Week 1): Pt will perform all bathing with mod assist sit to stand. OT Short Term Goal 3 - Progress (Week 1): Met OT Short Term Goal 4 (Week 1): Pt will perform UB dressing with min assist to donn pullover shirt. OT Short Term Goal 4 - Progress (Week 1): Met OT Short Term Goal 5 (Week 1): Pt will perform toilet transfers with mod assist to 3:1. Week 2:  OT Short Term Goal 1 (Week 2): Pt will be able to stand at sink with min assist to allow her to pull up her pants with min assist. OT Short Term Goal 2 (Week 2): Pt will bathe in shower with steady assist using long sponge. OT Short Term Goal 3 (Week 2): Pt will perform self ROM to LUE with min verbal cues. OT Short Term Goal 4 (Week 2): Pt will transfer to toilet with min assist.  Skilled Therapeutic Interventions/Progress Updates: Continue OT 5-7 days a week, 60-90 min a day with Balance/vestibular training;Functional mobility  training;DME/adaptive equipment instruction;Neuromuscular re-education;Patient/family education;Self Care/advanced ADL retraining;Therapeutic Exercise;Therapeutic Activities;UE/LE Strength taining/ROM;UE/LE Coordination activities;Visual/perceptual remediation/compensation  To maximize patients level of independence with ADLs.  Therapy Documentation Precautions:  Precautions Precautions: Fall Precaution Comments: aphasic Restrictions Weight Bearing Restrictions: No    Pain: Pain Assessment Pain Assessment: No/denies pain ADL:  See FIM for current functional status  Therapy/Group: Individual Therapy  Katie Faraone 07/03/2012, 12:18 PM

## 2012-07-03 NOTE — Progress Notes (Signed)
ANTICOAGULATION CONSULT NOTE - Follow Up Consult  Pharmacy Consult for Coumadin Indication: LV thrombus  No Known Allergies  Patient Measurements: Height: 5\' 6"  (167.6 cm) Weight: 175 lb 4.3 oz (79.5 kg) IBW/kg (Calculated) : 59.3   Vital Signs: Temp: 98.5 F (36.9 C) (11/01 0547) Temp src: Oral (11/01 0547) BP: 112/78 mmHg (11/01 0845) Pulse Rate: 100  (11/01 0847)  Labs:  Basename 07/03/12 0617 07/01/12 0615  HGB -- --  HCT -- --  PLT -- --  APTT -- --  LABPROT 29.5* 24.3*  INR 2.99* 2.30*  HEPARINUNFRC -- --  CREATININE 0.45* 0.53  CKTOTAL -- --  CKMB -- --  TROPONINI -- --    Estimated Creatinine Clearance: 93.5 ml/min (by C-G formula based on Cr of 0.45).  Assessment: 45yof [facility] on [facility]18 on coumadin 5mg  daily and lovenox 80mg  q12 for new LV thrombus. INR 2.15 at time of discharge after only 2 doses of coumadin. Returns to the hospital that same night with difficulty speaking and L sided weakness. 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   Coumadin continued (ok by neuro) at home dose of 5mg  daily. INR is now above her adjusted goal on 5mg  per day.  Goal of Therapy:  INR 2-2.5 (lower goal secondary to hemorrhagic transformation of CVA)   Plan:  1) Decrease Coumadin to 2.5mg  today 2) Resume daily PT/INR checks  [name], Pharm.D., BCPS Clinical Pharmacist  Phone [phone] Pager [phone] 07/03/2012, 2:18 PM

## 2012-07-03 NOTE — Progress Notes (Signed)
Occupational Therapy Session Note  Patient Details  Name: Margaret Hess MRN: [id number] Date of Birth: Jun 21, 1966  Today's Date: 07/03/2012 Time: 1500-1540 Time Calculation (min): 40 min  Short Term Goals: Week 2:  OT Short Term Goal 1 (Week 2): Pt will be able to stand at sink with min assist to allow her to pull up her pants with min assist. OT Short Term Goal 2 (Week 2): Pt will bathe in shower with steady assist using long sponge. OT Short Term Goal 3 (Week 2): Pt will perform self ROM to LUE with min verbal cues. OT Short Term Goal 4 (Week 2): Pt will transfer to toilet with min assist.  Skilled Therapeutic Interventions/Progress Updates:  Patient resting in bed upon arrival.  Engaged in LUE PROM and Self ROM exercises while supine and seated EOB.  Focus session on using UE exercise handout with drawings as well as demonstration and hand over hand techniques for patient to perform self ROM exercises.  Patient was able to verbalize when pain in left shoulder occurred during PROM with therapist, scapula readjusted and full ROM of shoulder movements.  Bed mobility sequencing to improve success in supine>roll right>sit>right sidelying>supine.  Patient requested back to bed after session.  Therapy Documentation Precautions:  Precautions Precautions: Fall Precaution Comments: aphasic Restrictions Weight Bearing Restrictions: No Pain: No reports of pain  Therapy/Group: Individual Therapy  Andera Cranmer 07/03/2012, 5:35 PM

## 2012-07-03 NOTE — Progress Notes (Signed)
Nutrition Brief Note  Patient identified on the Malnutrition Screening Tool (MST) Report. Discussed weight history with patient and family. Pt's weight has been stable at 175 lb. Reports that her intake is variable, but overall adequate.  Discussed therapeutic diets. Pt reports that she was following a low-carbohydrate diet PTA. Discussed need for low sodium diet with heart failure. Pt reports that this is a new issue for her.  RD provided "Low Sodium Nutrition Therapy" handout from the [employer]. Reviewed patient's dietary recall. Provided examples on ways to decrease sodium intake in diet. Discouraged intake of processed foods and use of salt shaker. Encouraged fresh fruits and vegetables as well as whole grain sources of carbohydrates to maximize fiber intake.  Provided RD contact information.  Body mass index is 28.29 kg/(m^2). Pt meets criteria for overweight based on current BMI.   Current diet order is Carbohydrate Modified Medium, patient is consuming approximately 80 - 100% of meals at this time. Labs and medications reviewed.   No nutrition interventions warranted at this time. If nutrition issues arise, please consult RD.   Jarold Motto MS, RD, LDN Pager: [phone] After-hours pager: [phone]

## 2012-07-03 NOTE — Progress Notes (Addendum)
Speech Language Pathology Daily Session Note  Patient Details  Name: Margaret Hess MRN: [id number] Date of Birth: Jan 02, 1966  Today's Date: 07/03/2012 Time: 1100-1200 Time Calculation (min): 60 min  Short Term Goals: Week 2: SLP Short Term Goal 1 (Week 2): Patient will consume regular textures and thin liquids with intermittent supervision cues and no overt s/s of aspiration.  SLP Short Term Goal 2 (Week 2): Patient will verbally complete automatic sequences with supervision level assist phonemic cues.  SLP Short Term Goal 3 (Week 2): Patient will verbally produce phrase level (3-4 word) utterances to describe situations, pictures, etc. with mod assist verbal and written cues.  SLP Short Term Goal 4 (Week 2): Patient will self monitor accuracy of verbal expression with mod assist verbal and non-verbal cues.  SLP Short Term Goal 5 (Week 2): Patient will write by copying basic, biographical information with mod assist multi-modal cues to self monitor and correct errors.      Skilled Therapeutic Interventions: Treatment focused on expanding grammatical form to include functor words (articles, prepositions) by providing written and verbal cues.  With written  template for use of target functors, pt able to produce correct grammatical form with 75% accuracy and mod cueing.  Pt  able to write words to dictation with improved self-cuing using written alphabet and intemittent clinician modeling.  Min assist overall for writing task.     FIM:  Comprehension Comprehension Mode: Auditory Comprehension: 5-Follows basic conversation/direction: With no assist Expression Expression Mode: Verbal Expression: 4-Expresses basic 75 - 89% of the time/requires cueing 10 - 24% of the time. Needs helper to occlude trach/needs to repeat words. Social Interaction Social Interaction: 5-Interacts appropriately 90% of the time - Needs monitoring or encouragement for participation or interaction. Problem  Solving Problem Solving: 5-Solves basic 90% of the time/requires cueing < 10% of the time Memory Memory: 5-Recognizes or recalls 90% of the time/requires cueing < 10% of the time  Pain Pain Assessment Pain Assessment: No/denies pain  Therapy/Group: Individual Therapy  Blenda Mounts Laurice 07/03/2012, 12:43 PM

## 2012-07-03 NOTE — Progress Notes (Addendum)
Physical Therapy Session Note  Patient Details  Name: Margaret Hess MRN: [id number] Date of Birth: May 02, 1966  Today's Date: 07/03/2012 Time: 0800-0900 Time Calculation (min): 60 min  Short Term Goals: Week 2:  PT Short Term Goal 1 (Week 2): Pt will perform rolling R after  2 VCs with supervision. PT Short Term Goal 2 (Week 2): Pt will perform basic transfer with min assist. PT Short Term Goal 3 (Week 2): Pt will stand during functional activity x 10 minutes with mod assist for balance. PT Short Term Goal 4 (Week 2): Pt will perform gait x 10' with LRAD, assist of 1 person. PT Short Term Goal 5 (Week 2): Pt will propel w/c 150' with supervision, with 0-1 Vc for route finding.  Skilled Therapeutic Interventions/Progress Updates:   Rolling R with 3 VCs, tactile cues- pt progressed LLE off bed without assist  Bed> w/c to L stand pivot with mod assist.  W/c>< toilet stand pivot using wall bar, mod assist for balance while she assisted with clothing mgt.  Hygiene with min assist.  PROM LLE hamstrings and heel cord, in sitting.  neuromuscular re-education via forced use, tactile and VCs, for:  -Sit> stand with mod assist, wt shifting L<>R working on LLE stance stability wearing L AFO, in parallel bars.  Pt c/o dizziness- BP below.  Dizziness abated after sitting, and did not recur with 2 short walks.  Pt very concerned this indicates a further cardiac problem; requested MD address this with pt.  -gait x 5' and x 8' forward and backward with mod assist, focusing on moving slowly, L stance stability, wider BOS.    W/c propulsion using hemi method with RUE and RLe, x 75' with 1 cue for route finding.  Therapy Documentation Precautions:  Precautions Precautions: Fall Precaution Comments: aphasic Restrictions Weight Bearing Restrictions: No   Vital Signs: Therapy Vitals BP 112/78 sitting, immedica Hr 100 O2 sats 94%  On room air Pain: Pain Assessment Pain Assessment:  No/denies pain Pain Score: 0-No pain      See FIM for current functional status  Therapy/Group: Individual Therapy  Avari Gelles 07/03/2012, 9:22 AM

## 2012-07-04 ENCOUNTER — Inpatient Hospital Stay (HOSPITAL_COMMUNITY): Payer: BC Managed Care – PPO | Admitting: Speech Pathology

## 2012-07-04 ENCOUNTER — Inpatient Hospital Stay (HOSPITAL_COMMUNITY): Payer: BC Managed Care – PPO | Admitting: Physical Therapy

## 2012-07-04 DIAGNOSIS — I69991 Dysphagia following unspecified cerebrovascular disease: Secondary | ICD-10-CM

## 2012-07-04 DIAGNOSIS — E119 Type 2 diabetes mellitus without complications: Secondary | ICD-10-CM

## 2012-07-04 DIAGNOSIS — G8110 Spastic hemiplegia affecting unspecified side: Secondary | ICD-10-CM

## 2012-07-04 LAB — GLUCOSE, CAPILLARY
Glucose-Capillary: 106 mg/dL — ABNORMAL HIGH (ref 70–99)
Glucose-Capillary: 133 mg/dL — ABNORMAL HIGH (ref 70–99)

## 2012-07-04 MED ORDER — WARFARIN SODIUM 2.5 MG PO TABS
2.5000 mg | ORAL_TABLET | Freq: Once | ORAL | Status: AC
  Administered 2012-07-04: 2.5 mg via ORAL
  Filled 2012-07-04: qty 1

## 2012-07-04 NOTE — Progress Notes (Signed)
Physical Therapy Session Note  Patient Details  Name: Margaret Hess MRN: [id number] Date of Birth: May 05, 1966  Today's Date: 07/04/2012 Time: 1030-1100 Time Calculation (min): 30 min  Short Term Goals: Week 1:  PT Short Term Goal 1 (Week 1): Patient will peform rolling bilaterally with mod assist PT Short Term Goal 1 - Progress (Week 1): Met PT Short Term Goal 2 (Week 1): Patient will perform bed <> w/c stand-pivot transfers with max assist PT Short Term Goal 2 - Progress (Week 1): Met PT Short Term Goal 3 (Week 1): Patient will propel w/c with R hemi technique with mod assist PT Short Term Goal 3 - Progress (Week 1): Met   Therapy Documentation Precautions:  Precautions: Fall Precaution Comments: aphasic Restrictions Weight Bearing Restrictions: No Pain:   Therapeutic Activity:(15') Bed Mobility with min-A, Transfer Training sit<->stand with min-A and bed<->w/c with Mod-A initially and min-A later. Therapeutic Exercise:(15') B LE in supine and in sitting with AAROM L LE as well as PNF patterns for rhythmic initaition  See FIM for current functional status  Therapy/Group: Individual Therapy  Kelton Bultman J 07/04/2012, 10:31 AM

## 2012-07-04 NOTE — Progress Notes (Signed)
Speech Language Pathology Daily Session Note  Patient Details  Name: Margaret Hess MRN: [id number] Date of Birth: [birth year]  Today's Date: 07/04/2012 Time: 1230-1315 Time Calculation (min): 45 min  Short Term Goals: Week 2: SLP Short Term Goal 1 (Week 2): Patient will consume regular textures and thin liquids with intermittent supervision cues and no overt s/s of aspiration. SLP Short Term Goal 1 - Progress (Week 2): Progressing toward goal SLP Short Term Goal 2 (Week 2): Patient will verbally complete automatic sequences with supervision level assist phonemic cues. SLP Short Term Goal 2 - Progress (Week 2): Progressing toward goal SLP Short Term Goal 3 (Week 2): Patient will verbally produce phrase level (3-4 word) utterances to describe situations, pictures, etc. with mod assist verbal and written cues. SLP Short Term Goal 3 - Progress (Week 2): Progressing toward goal SLP Short Term Goal 4 (Week 2): Patient will self monitor accuracy of verbal expression with mod assist verbal and non-verbal cues. SLP Short Term Goal 4 - Progress (Week 2): Progressing toward goal SLP Short Term Goal 5 (Week 2): Patient will write by copying basic, biographical information with mod assist multi-modal cues to self monitor and correct errors. SLP Short Term Goal 5 - Progress (Week 2): Progressing toward goal  Skilled Therapeutic Interventions: Treatment focused on expanding grammatical form to include function words (articles, prepositions) by providing written and visual cues. With written template for use of target function words and visual cue from clinician, pt able to produce correct grammatical form with mod cueing. Pt requested clinician to help her navigate her iphone and required overall Min verbal and visual cues for recall.    FIM:  Comprehension Comprehension Mode: Auditory Comprehension: 5-Understands basic 90% of the time/requires cueing < 10% of the time Expression Expression Mode:  Verbal Expression: 3-Expresses basic 50 - 74% of the time/requires cueing 25 - 50% of the time. Needs to repeat parts of sentences. Social Interaction Social Interaction: 5-Interacts appropriately 90% of the time - Needs monitoring or encouragement for participation or interaction. Problem Solving Problem Solving: 5-Solves basic 90% of the time/requires cueing < 10% of the time Memory Memory: 5-Recognizes or recalls 90% of the time/requires cueing < 10% of the time  Pain Pain Assessment Pain Assessment: No/denies pain  Therapy/Group: Individual Therapy  Rorik Vespa 07/04/2012, 2:29 PM

## 2012-07-04 NOTE — Progress Notes (Signed)
Patient ID: Margaret Hess, female   DOB: 08/21/1966, 46 y.o.   MRN: [id number]  Subjective/Complaints: Some back pain, but not severe.  Y/N still inaccurate but pt self correctsReview of Systems  Unable to perform ROS: medical condition   Objective: Vital Signs: Blood pressure 117/77, pulse 93, temperature 98.4 F (36.9 C), temperature source Oral, resp. rate 20, height 5\' 6"  (1.676 m), weight 79.5 kg (175 lb 4.3 oz), SpO2 96.00%. No results found. Results for orders placed during the hospital encounter of 06/25/12 (from the past 72 hour(s))  GLUCOSE, CAPILLARY     Status: Abnormal   Collection Time   07/01/12  7:36 AM      Component Value Range Comment   Glucose-Capillary 172 (*) 70 - 99 mg/dL   GLUCOSE, CAPILLARY     Status: Abnormal   Collection Time   07/01/12 11:59 AM      Component Value Range Comment   Glucose-Capillary 154 (*) 70 - 99 mg/dL    Comment 1 Notify RN     GLUCOSE, CAPILLARY     Status: Normal   Collection Time   07/01/12  4:47 PM      Component Value Range Comment   Glucose-Capillary 98  70 - 99 mg/dL   GLUCOSE, CAPILLARY     Status: Abnormal   Collection Time   07/01/12  8:57 PM      Component Value Range Comment   Glucose-Capillary 149 (*) 70 - 99 mg/dL   GLUCOSE, CAPILLARY     Status: Abnormal   Collection Time   07/02/12  7:08 AM      Component Value Range Comment   Glucose-Capillary 174 (*) 70 - 99 mg/dL    Comment 1 Notify RN     GLUCOSE, CAPILLARY     Status: Abnormal   Collection Time   07/02/12 11:36 AM      Component Value Range Comment   Glucose-Capillary 192 (*) 70 - 99 mg/dL    Comment 1 Notify RN     GLUCOSE, CAPILLARY     Status: Abnormal   Collection Time   07/02/12  4:46 PM      Component Value Range Comment   Glucose-Capillary 125 (*) 70 - 99 mg/dL   GLUCOSE, CAPILLARY     Status: Abnormal   Collection Time   07/02/12  9:38 PM      Component Value Range Comment   Glucose-Capillary 126 (*) 70 - 99 mg/dL   PROTIME-INR      Status: Abnormal   Collection Time   07/03/12  6:17 AM      Component Value Range Comment   Prothrombin Time 29.5 (*) 11.6 - 15.2 seconds    INR 2.99 (*) 0.00 - 1.49   BASIC METABOLIC PANEL     Status: Abnormal   Collection Time   07/03/12  6:17 AM      Component Value Range Comment   Sodium 132 (*) 135 - 145 mEq/L    Potassium 4.0  3.5 - 5.1 mEq/L    Chloride 94 (*) 96 - 112 mEq/L    CO2 24  19 - 32 mEq/L    Glucose, Bld 195 (*) 70 - 99 mg/dL    BUN 17  6 - 23 mg/dL    Creatinine, Ser 7.25 (*) 0.50 - 1.10 mg/dL    Calcium 9.9  8.4 - 36.6 mg/dL    GFR calc non Af Amer >90  >90 mL/min    GFR calc Af Amer >90  >  90 mL/min   DIGOXIN LEVEL     Status: Abnormal   Collection Time   07/03/12  6:17 AM      Component Value Range Comment   Digoxin Level 0.5 (*) 0.8 - 2.0 ng/mL   GLUCOSE, CAPILLARY     Status: Abnormal   Collection Time   07/03/12  7:27 AM      Component Value Range Comment   Glucose-Capillary 164 (*) 70 - 99 mg/dL   GLUCOSE, CAPILLARY     Status: Abnormal   Collection Time   07/03/12 12:04 PM      Component Value Range Comment   Glucose-Capillary 179 (*) 70 - 99 mg/dL   GLUCOSE, CAPILLARY     Status: Abnormal   Collection Time   07/03/12  4:27 PM      Component Value Range Comment   Glucose-Capillary 157 (*) 70 - 99 mg/dL   GLUCOSE, CAPILLARY     Status: Abnormal   Collection Time   07/03/12  8:50 PM      Component Value Range Comment   Glucose-Capillary 125 (*) 70 - 99 mg/dL      HEENT: normal Cardio: RRR Resp: CTA B/L GI: BS positive Extremity:  No Edema Skin:   Intact Neuro: Abnormal Sensory absent light touch and proprio, positive deep pain, Abnormal Motor 0/5 in LUE and LLE, Abnormal FMC Tone  reduced on L side, Tone:  Hypotonia, Aphasic, Inattention and Apraxic Musc/Skel:  Swelling L dorsum hand ecchymosis and swelling-mild Increased L hamstrings and hip adductor tone--unchanged  Assessment/Plan: 1. Functional deficits secondary to R MCA infarct L HP,  crossed aphasia,apraxia  which require 3+ hours per day of interdisciplinary therapy in a comprehensive inpatient rehab setting. Physiatrist is providing close team supervision and 24 hour management of active medical problems listed below. Physiatrist and rehab team continue to assess barriers to discharge/monitor patient progress toward functional and medical goals. FIM: FIM - Bathing Bathing Steps Patient Completed: Chest;Right Arm;Left Arm;Abdomen;Front perineal area;Right upper leg;Left upper leg;Right lower leg (including foot) Bathing: 4: Min-Patient completes 8-9 61f 10 parts or 75+ percent  FIM - Upper Body Dressing/Undressing Upper body dressing/undressing steps patient completed: Thread/unthread right sleeve of pullover shirt/dresss;Put head through opening of pull over shirt/dress;Pull shirt over trunk Upper body dressing/undressing: 4: Min-Patient completed 75 plus % of tasks FIM - Lower Body Dressing/Undressing Lower body dressing/undressing steps patient completed: Thread/unthread right underwear leg;Thread/unthread left underwear leg;Thread/unthread right pants leg;Thread/unthread left pants leg Lower body dressing/undressing: 2: Max-Patient completed 25-49% of tasks  FIM - Toileting Toileting steps completed by patient: Performs perineal hygiene Toileting Assistive Devices: Grab bar or rail for support Toileting: 2: Max-Patient completed 1 of 3 steps  FIM - Diplomatic Services operational officer Devices: Therapist, music Transfers: 1-Two helpers  FIM - Banker Devices: Arm rests Bed/Chair Transfer: 3: Supine > Sit: Mod A (lifting assist/Pt. 50-74%/lift 2 legs;4: Sit > Supine: Min A (steadying pt. > 75%/lift 1 leg)  FIM - Locomotion: Wheelchair Locomotion: Wheelchair: 5: Travels 50 - 149 ft, turns around, maneuvers to table, bed or toilet, negotiates 3% grade: modified independent FIM - Locomotion: Ambulation Locomotion:  Ambulation Assistive Devices: Parallel bars Ambulation/Gait Assistance: 3: Mod assist Locomotion: Ambulation: 1: Travels less than 50 ft with moderate assistance (Pt: 50 - 74%)  Comprehension Comprehension Mode: Auditory Comprehension: 4-Understands basic 75 - 89% of the time/requires cueing 10 - 24% of the time  Expression Expression Mode: Verbal Expression: 3-Expresses basic 50 - 74%  of the time/requires cueing 25 - 50% of the time. Needs to repeat parts of sentences.  Social Interaction Social Interaction Mode: Asleep Social Interaction: 5-Interacts appropriately 90% of the time - Needs monitoring or encouragement for participation or interaction.  Problem Solving Problem Solving Mode: Asleep Problem Solving: 4-Solves basic 75 - 89% of the time/requires cueing 10 - 24% of the time  Memory Memory Mode: Asleep Memory: 4-Recognizes or recalls 75 - 89% of the time/requires cueing 10 - 24% of the time  Medical Problem List and Plan:  1. 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 DVT Prophylaxis/Anticoagulation: Chronic Coumadin therapy with a goal INR of 2-2.5 secondary hemorrhagic transformation  3. Neuropsych: This patient is not capable of making decisions on his/her own behalf.  4. 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 Lisinopril 2.5 mg twice a day, Lanoxin 0.125 mg daily. Monitor with increased mobility. Cardiology to follow up as needed(Dr. Bensimhon)  5. Diabetes mellitus. Patient currently on sliding scale insulin. Patient on Glucophage 500 mg twice a day prior to admission and victoza 1.2 mL subcutaneously daily. Check blood sugars a.c. and at bedtime increase oral agents metformin today.  If this fails then restart Victoza 6. Mood/depression. Patient on Wellbutrin prior to admission 75 mg daily. We'll discuss resuming this medication. Provide emotional support and positive reinforcement    7. Sensory dysesthesia related to parietal infarct and hemorrhagic transformation, will increase gabapentin- appears effective at current dose 8.  Back pain mild/mod will use K pad and analgesic balm  -encouraged oob, lumbar role as well   LOS (Days) 9 A FACE TO FACE EVALUATION WAS PERFORMED  Tracey Hermance T 07/04/2012, 6:46 AM

## 2012-07-04 NOTE — Progress Notes (Signed)
ANTICOAGULATION CONSULT NOTE - Follow Up Consult  Pharmacy Consult for Coumadin Indication: LV thrombus  No Known Allergies  Patient Measurements: Height: 5\' 6"  (167.6 cm) Weight: 168 lb 3.4 oz (76.3 kg) IBW/kg (Calculated) : 59.3   Vital Signs: Temp: 98.7 F (37.1 C) (11/02 0700) Temp src: Oral (11/02 0700) BP: 109/72 mmHg (11/02 0700) Pulse Rate: 95  (11/02 0700)  Labs:  Basename 07/04/12 0640 07/03/12 0617  HGB -- --  HCT -- --  PLT -- --  APTT -- --  LABPROT 28.3* 29.5*  INR 2.83* 2.99*  HEPARINUNFRC -- --  CREATININE -- 0.45*  CKTOTAL -- --  CKMB -- --  TROPONINI -- --    Estimated Creatinine Clearance: 91.7 ml/min (by C-G formula based on Cr of 0.45).  Assessment: Margaret Hess discharged from [facility] on 10/18 on coumadin 5mg  daily and lovenox 80mg  q12 for new LV thrombus. INR 2.15 at time of discharge after only 2 doses of coumadin. Returns to the hospital that same night with difficulty speaking and L sided weakness. 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   Coumadin continued (ok by neuro) at home dose of 5mg  daily. INR is now slightly above her adjusted goal.  Goal of Therapy:  INR 2-2.5 (lower goal secondary to hemorrhagic transformation of CVA)   Plan:  1) Decrease Coumadin to 2.5mg  today 2) Resume daily PT/INR checks  Margaret Hess, Pharm.D., BCPS Clinical Pharmacist  Phone [phone] Pager [phone] 07/04/2012, 12:13 PM

## 2012-07-05 ENCOUNTER — Inpatient Hospital Stay (HOSPITAL_COMMUNITY): Payer: BC Managed Care – PPO | Admitting: Occupational Therapy

## 2012-07-05 LAB — GLUCOSE, CAPILLARY
Glucose-Capillary: 144 mg/dL — ABNORMAL HIGH (ref 70–99)
Glucose-Capillary: 141 mg/dL — ABNORMAL HIGH (ref 70–99)
Glucose-Capillary: 120 mg/dL — ABNORMAL HIGH (ref 70–99)

## 2012-07-05 LAB — PROTIME-INR: INR: 2.42 — ABNORMAL HIGH (ref 0.00–1.49)

## 2012-07-05 MED ORDER — WARFARIN SODIUM 5 MG PO TABS
5.0000 mg | ORAL_TABLET | Freq: Once | ORAL | Status: AC
  Administered 2012-07-05: 5 mg via ORAL
  Filled 2012-07-05: qty 1

## 2012-07-05 NOTE — Progress Notes (Signed)
ANTICOAGULATION CONSULT NOTE - Follow Up Consult  Pharmacy Consult for Coumadin Indication: LV thrombus  No Known Allergies  Patient Measurements: Height: 5\' 6"  (167.6 cm) Weight: 171 lb 15.3 oz (78 kg) IBW/kg (Calculated) : 59.3   Vital Signs: Temp: 98.4 F (36.9 C) (11/03 0538) Temp src: Oral (11/03 0538) BP: 112/73 mmHg (11/03 0538) Pulse Rate: 89  (11/03 0538)  Labs:  Alvira Philips 07/05/12 0645 07/04/12 0640 07/03/12 0617  HGB -- -- --  HCT -- -- --  PLT -- -- --  APTT -- -- --  LABPROT 25.2* 28.3* 29.5*  INR 2.42* 2.83* 2.99*  HEPARINUNFRC -- -- --  CREATININE -- -- 0.45*  CKTOTAL -- -- --  CKMB -- -- --  TROPONINI -- -- --    Estimated Creatinine Clearance: 92.7 ml/min (by C-G formula based on Cr of 0.45).  Assessment: 45yof discharged from [facility] on 10/18 on coumadin 5mg  daily and lovenox 80mg  q12 for new LV thrombus. INR 2.15 at time of discharge after only 2 doses of coumadin. Returns to the hospital that same night with difficulty speaking and L sided weakness. 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   Coumadin continued (ok by neuro) at home dose of 5mg  daily. INR is now within her adjusted goal after decreasing dose 11/1 and 11/2.  Goal of Therapy:  INR 2-2.5 (lower goal secondary to hemorrhagic transformation of CVA)   Plan:  Increase Coumadin to 5mg  today Continue daily PT/INR checks  [name], Pharm.D., BCPS Clinical Pharmacist  Phone [phone] Pager [phone] 07/05/2012, 11:03 AM

## 2012-07-05 NOTE — Progress Notes (Signed)
Occupational Therapy Session Note  Patient Details  Name: Margaret Hess MRN: [id number] Date of Birth: Jan 19, 1966  Today's Date: 07/05/2012 Time: 0900-1010 Time Calculation (min): 70 min  Short Term Goals: Week 2:  OT Short Term Goal 1 (Week 2): Pt will be able to stand at sink with min assist to allow her to pull up her pants with min assist. OT Short Term Goal 2 (Week 2): Pt will bathe in shower with steady assist using long sponge. OT Short Term Goal 3 (Week 2): Pt will perform self ROM to LUE with min verbal cues. OT Short Term Goal 4 (Week 2): Pt will transfer to toilet with min assist.  Skilled Therapeutic Interventions/Progress Updates:  Patient resting in bed upon arrival.  Engaged in self care retraining to include shower, dress and groom.  Focus session on management and attention to her LUE through out functional mobility and BADL tasks, sitting and standing balance, safe bed and shower stand pivot transfers.  Therapy Documentation Precautions:  Precautions Precautions: Fall Precaution Comments: aphasic Restrictions Weight Bearing Restrictions: No Pain: Denies pain ADL:  See FIM for current functional status  Therapy/Group: Individual Therapy  Rawan Riendeau 07/05/2012, 4:19 PM

## 2012-07-05 NOTE — Progress Notes (Signed)
Patient ID: Margaret Hess, female   DOB: 04/16/1966, 46 y.o.   MRN: [id number]  Subjective/Complaints: Back pain better today. Slept well.  Y/N still inaccurate but pt self correctsReview of Systems  Unable to perform ROS: medical condition   Objective: Vital Signs: Blood pressure 112/73, pulse 89, temperature 98.4 F (36.9 C), temperature source Oral, resp. rate 20, height 5\' 6"  (1.676 m), weight 78 kg (171 lb 15.3 oz), SpO2 97.00%. No results found. Results for orders placed during the hospital encounter of 06/25/12 (from the past 72 hour(s))  GLUCOSE, CAPILLARY     Status: Abnormal   Collection Time   07/02/12  7:08 AM      Component Value Range Comment   Glucose-Capillary 174 (*) 70 - 99 mg/dL    Comment 1 Notify RN     GLUCOSE, CAPILLARY     Status: Abnormal   Collection Time   07/02/12 11:36 AM      Component Value Range Comment   Glucose-Capillary 192 (*) 70 - 99 mg/dL    Comment 1 Notify RN     GLUCOSE, CAPILLARY     Status: Abnormal   Collection Time   07/02/12  4:46 PM      Component Value Range Comment   Glucose-Capillary 125 (*) 70 - 99 mg/dL   GLUCOSE, CAPILLARY     Status: Abnormal   Collection Time   07/02/12  9:38 PM      Component Value Range Comment   Glucose-Capillary 126 (*) 70 - 99 mg/dL   PROTIME-INR     Status: Abnormal   Collection Time   07/03/12  6:17 AM      Component Value Range Comment   Prothrombin Time 29.5 (*) 11.6 - 15.2 seconds    INR 2.99 (*) 0.00 - 1.49   BASIC METABOLIC PANEL     Status: Abnormal   Collection Time   07/03/12  6:17 AM      Component Value Range Comment   Sodium 132 (*) 135 - 145 mEq/L    Potassium 4.0  3.5 - 5.1 mEq/L    Chloride 94 (*) 96 - 112 mEq/L    CO2 24  19 - 32 mEq/L    Glucose, Bld 195 (*) 70 - 99 mg/dL    BUN 17  6 - 23 mg/dL    Creatinine, Ser 4.09 (*) 0.50 - 1.10 mg/dL    Calcium 9.9  8.4 - 81.1 mg/dL    GFR calc non Af Amer >90  >90 mL/min    GFR calc Af Amer >90  >90 mL/min   DIGOXIN LEVEL      Status: Abnormal   Collection Time   07/03/12  6:17 AM      Component Value Range Comment   Digoxin Level 0.5 (*) 0.8 - 2.0 ng/mL   GLUCOSE, CAPILLARY     Status: Abnormal   Collection Time   07/03/12  7:27 AM      Component Value Range Comment   Glucose-Capillary 164 (*) 70 - 99 mg/dL   GLUCOSE, CAPILLARY     Status: Abnormal   Collection Time   07/03/12 12:04 PM      Component Value Range Comment   Glucose-Capillary 179 (*) 70 - 99 mg/dL   GLUCOSE, CAPILLARY     Status: Abnormal   Collection Time   07/03/12  4:27 PM      Component Value Range Comment   Glucose-Capillary 157 (*) 70 - 99 mg/dL   GLUCOSE, CAPILLARY  Status: Abnormal   Collection Time   07/03/12  8:50 PM      Component Value Range Comment   Glucose-Capillary 125 (*) 70 - 99 mg/dL   PROTIME-INR     Status: Abnormal   Collection Time   07/04/12  6:40 AM      Component Value Range Comment   Prothrombin Time 28.3 (*) 11.6 - 15.2 seconds    INR 2.83 (*) 0.00 - 1.49   GLUCOSE, CAPILLARY     Status: Abnormal   Collection Time   07/04/12 12:08 PM      Component Value Range Comment   Glucose-Capillary 211 (*) 70 - 99 mg/dL   GLUCOSE, CAPILLARY     Status: Abnormal   Collection Time   07/04/12  4:44 PM      Component Value Range Comment   Glucose-Capillary 106 (*) 70 - 99 mg/dL   GLUCOSE, CAPILLARY     Status: Abnormal   Collection Time   07/04/12  9:11 PM      Component Value Range Comment   Glucose-Capillary 133 (*) 70 - 99 mg/dL    Comment 1 Notify RN        HEENT: normal Cardio: RRR Resp: CTA B/L GI: BS positive Extremity:  No Edema Skin:   Intact Neuro: Abnormal Sensory absent light touch and proprio, positive deep pain, Abnormal Motor 0/5 in LUE and LLE, Abnormal FMC Tone  reduced on L side, Tone:  Hypotonia, Aphasic, Inattention and Apraxic Musc/Skel:  Swelling L dorsum hand ecchymosis and swelling-mild Increased L hamstrings and hip adductor tone--unchanged  Assessment/Plan: 1. Functional deficits  secondary to R MCA infarct L HP, crossed aphasia,apraxia  which require 3+ hours per day of interdisciplinary therapy in a comprehensive inpatient rehab setting. Physiatrist is providing close team supervision and 24 hour management of active medical problems listed below. Physiatrist and rehab team continue to assess barriers to discharge/monitor patient progress toward functional and medical goals. FIM: FIM - Bathing Bathing Steps Patient Completed: Chest;Right Arm;Left Arm;Abdomen;Front perineal area;Right upper leg;Left upper leg;Right lower leg (including foot) Bathing: 4: Min-Patient completes 8-9 41f 10 parts or 75+ percent  FIM - Upper Body Dressing/Undressing Upper body dressing/undressing steps patient completed: Thread/unthread right sleeve of pullover shirt/dresss;Put head through opening of pull over shirt/dress;Pull shirt over trunk Upper body dressing/undressing: 4: Min-Patient completed 75 plus % of tasks FIM - Lower Body Dressing/Undressing Lower body dressing/undressing steps patient completed: Thread/unthread right underwear leg;Thread/unthread left underwear leg;Thread/unthread right pants leg;Thread/unthread left pants leg Lower body dressing/undressing: 2: Max-Patient completed 25-49% of tasks  FIM - Toileting Toileting steps completed by patient: Performs perineal hygiene Toileting Assistive Devices: Grab bar or rail for support Toileting: 2: Max-Patient completed 1 of 3 steps  FIM - Diplomatic Services operational officer Devices: Therapist, music Transfers: 1-Two helpers  FIM - Banker Devices: Arm rests Bed/Chair Transfer: 3: Supine > Sit: Mod A (lifting assist/Pt. 50-74%/lift 2 legs;4: Sit > Supine: Min A (steadying pt. > 75%/lift 1 leg)  FIM - Locomotion: Wheelchair Locomotion: Wheelchair: 5: Travels 50 - 149 ft, turns around, maneuvers to table, bed or toilet, negotiates 3% grade: modified independent FIM -  Locomotion: Ambulation Locomotion: Ambulation Assistive Devices: Parallel bars Ambulation/Gait Assistance: 3: Mod assist Locomotion: Ambulation: 1: Travels less than 50 ft with moderate assistance (Pt: 50 - 74%)  Comprehension Comprehension Mode: Auditory Comprehension: 5-Understands basic 90% of the time/requires cueing < 10% of the time  Expression Expression Mode: Verbal Expression: 3-Expresses  basic 50 - 74% of the time/requires cueing 25 - 50% of the time. Needs to repeat parts of sentences.  Social Interaction Social Interaction Mode: Asleep Social Interaction: 5-Interacts appropriately 90% of the time - Needs monitoring or encouragement for participation or interaction.  Problem Solving Problem Solving Mode: Asleep Problem Solving: 5-Solves basic 90% of the time/requires cueing < 10% of the time  Memory Memory Mode: Asleep Memory: 5-Recognizes or recalls 90% of the time/requires cueing < 10% of the time  Medical Problem List and Plan:  1. 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 DVT Prophylaxis/Anticoagulation: Chronic Coumadin therapy with a goal INR of 2-2.5 secondary hemorrhagic transformation  3. Neuropsych: This patient is not capable of making decisions on his/her own behalf.  4. 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 Lisinopril 2.5 mg twice a day, Lanoxin 0.125 mg daily. Monitor with increased mobility. Cardiology to follow up as needed(Dr. Bensimhon)  5. Diabetes mellitus. Patient currently on sliding scale insulin. Patient on Glucophage 500 mg twice a day prior to admission and victoza 1.2 mL subcutaneously daily. Check blood sugars a.c. and at bedtime increase oral agents metformin today.  If this fails then restart Victoza--improved control at present 6. Mood/depression. Patient on Wellbutrin prior to admission 75 mg daily. We'll discuss resuming this medication. Provide  emotional support and positive reinforcement  7. 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  Back pain mild/mod will use K pad and analgesic balm  -encouraged oob, lumbar roll as well   LOS (Days) 10 A FACE TO FACE EVALUATION WAS PERFORMED  Bilan Tedesco T 07/05/2012, 6:48 AM

## 2012-07-06 ENCOUNTER — Inpatient Hospital Stay (HOSPITAL_COMMUNITY): Payer: BC Managed Care – PPO | Admitting: Occupational Therapy

## 2012-07-06 ENCOUNTER — Inpatient Hospital Stay (HOSPITAL_COMMUNITY): Payer: BC Managed Care – PPO | Admitting: Physical Therapy

## 2012-07-06 ENCOUNTER — Inpatient Hospital Stay (HOSPITAL_COMMUNITY): Payer: BC Managed Care – PPO | Admitting: Speech Pathology

## 2012-07-06 DIAGNOSIS — E119 Type 2 diabetes mellitus without complications: Secondary | ICD-10-CM

## 2012-07-06 DIAGNOSIS — I69991 Dysphagia following unspecified cerebrovascular disease: Secondary | ICD-10-CM

## 2012-07-06 DIAGNOSIS — G8110 Spastic hemiplegia affecting unspecified side: Secondary | ICD-10-CM

## 2012-07-06 LAB — GLUCOSE, CAPILLARY
Glucose-Capillary: 155 mg/dL — ABNORMAL HIGH (ref 70–99)
Glucose-Capillary: 134 mg/dL — ABNORMAL HIGH (ref 70–99)
Glucose-Capillary: 132 mg/dL — ABNORMAL HIGH (ref 70–99)

## 2012-07-06 LAB — PROTIME-INR: INR: 2.32 — ABNORMAL HIGH (ref 0.00–1.49)

## 2012-07-06 MED ORDER — LISINOPRIL 2.5 MG PO TABS
2.5000 mg | ORAL_TABLET | Freq: Two times a day (BID) | ORAL | Status: DC
  Administered 2012-07-06 – 2012-07-16 (×10): 2.5 mg via ORAL
  Filled 2012-07-06 (×24): qty 1

## 2012-07-06 MED ORDER — WARFARIN SODIUM 4 MG PO TABS
4.0000 mg | ORAL_TABLET | Freq: Every day | ORAL | Status: DC
  Administered 2012-07-06 – 2012-07-07 (×2): 4 mg via ORAL
  Filled 2012-07-06 (×3): qty 1

## 2012-07-06 NOTE — Progress Notes (Signed)
Physical Therapy Note  Patient Details  Name: Margaret Hess MRN: [id number] Date of Birth: Mar 11, 1966 Today's Date: 07/06/2012  1100-1155 (55 minutes) individual Pain: no complaint of pain Focus of treatment: Neuro re-ed focused on LT LE knee extension strengthening/control; gait training with appropriate AD Treatment: wc mobility- 120 feet level min assist ; transfers wc >< mat squat/pivot min assist; sit to stand to partial sit 3 X 5 to facilitate equal weight bearing LEs/ Lt knee extension control; gait 35 feet X 4 mod assist with Left PFRW + Left AFO + recurvatum brace  with mod/max vcs for sequencing ( improving knee extension control in stance with moderate hyperextension).   1515- 1540 (25 minutes) individual Pain: no complaint of pain Focus of treatment: Gait training with appropriate AD Treatment: Sit to stand min assist; standing min assist with flexed LT knee; gait 25 feet X 2 left PFRW + left AFO + recurvatum brace  mod assist and min /mod vcs for sequencing.  [name],JIM 07/06/2012, 7:21 AM

## 2012-07-06 NOTE — Progress Notes (Signed)
Occupational Therapy Session Note  Patient Details  Name: Amayra Kiedrowski MRN: [id number] Date of Birth: 1966-08-16  Today's Date: 07/06/2012 Time: 1005-1100 Time Calculation (min): 55 min  Short Term Goals: Week 1:  OT Short Term Goal 1 (Week 1): Pt will maintain dynamic sitting balance with mod assist during selfcare activities. OT Short Term Goal 1 - Progress (Week 1): Met OT Short Term Goal 2 (Week 1): Pt will position the LUE prior and after transitional movements with mod questioning cues. OT Short Term Goal 2 - Progress (Week 1): Met OT Short Term Goal 3 (Week 1): Pt will perform all bathing with mod assist sit to stand. OT Short Term Goal 3 - Progress (Week 1): Met OT Short Term Goal 4 (Week 1): Pt will perform UB dressing with min assist to donn pullover shirt. OT Short Term Goal 4 - Progress (Week 1): Met OT Short Term Goal 5 (Week 1): Pt will perform toilet transfers with mod assist to 3:1.  Skilled Therapeutic Interventions/Progress Updates:      Pt seen for BADL retraining of toileting, bathing, and dressing with a focus on squat pivot transfers, sit to stand and standing with a focus on LLE motor control.  Increased flexor tone in LUE/LLE with movement but no active movement in LUE. Improved standing balance with trunk control.     Therapy Documentation Precautions:  Precautions Precautions: Fall Precaution Comments: aphasic Restrictions Weight Bearing Restrictions: No    Vital Signs: Therapy Vitals BP: 98/70 mmHg Patient Position, if appropriate: Sitting Pain: Pain Assessment Pain Assessment: No/denies pain ADL:  See FIM for current functional status  Therapy/Group: Individual Therapy  SAGUIER,JULIA 07/06/2012, 11:44 AM

## 2012-07-06 NOTE — Progress Notes (Signed)
ANTICOAGULATION CONSULT NOTE - Follow Up Consult  Pharmacy Consult for Coumadin Indication: LV thrombus  No Known Allergies  Patient Measurements: Height: 5\' 6"  (167.6 cm) Weight: 171 lb 8.3 oz (77.8 kg) IBW/kg (Calculated) : 59.3   Vital Signs: Temp: 98.9 F (37.2 C) (11/04 0500) Temp src: Oral (11/04 0500) BP: 98/70 mmHg (11/04 0938) Pulse Rate: 89  (11/04 0500)  Labs:  Basename 07/06/12 0730 07/05/12 0645 07/04/12 0640  HGB -- -- --  HCT -- -- --  PLT -- -- --  APTT -- -- --  LABPROT 24.4* 25.2* 28.3*  INR 2.32* 2.42* 2.83*  HEPARINUNFRC -- -- --  CREATININE -- -- --  CKTOTAL -- -- --  CKMB -- -- --  TROPONINI -- -- --    Estimated Creatinine Clearance: 92.5 ml/min (by C-G formula based on Cr of 0.45).  Assessment: 45yof discharged from [facility] on 10/18 on coumadin 5mg  daily and lovenox 80mg  q12 for new LV thrombus. INR 2.15 at time of discharge after only 2 doses of coumadin. Returns to the hospital that same night with difficulty speaking and L sided weakness. 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   Coumadin continued (ok by neuro) at home dose of 5mg  daily. INR is now within her adjusted goal after decreasing dose 11/1 and 11/2.  Goal of Therapy:  INR 2-2.5 (lower goal secondary to hemorrhagic transformation of CVA)   Plan:  Try Coumadin 4mg  daily Continue daily PT/INR checks  [name], Pharm.D., BCPS Clinical Pharmacist  Phone [phone] Pager [phone] 07/06/2012, 11:07 AM

## 2012-07-06 NOTE — Progress Notes (Signed)
Speech Language Pathology Daily Session Note  Patient Details  Name: Onell Mcmath MRN: [id number] Date of Birth: Mar 21, 1966  Today's Date: 07/06/2012 Time: 1305-1405 Time Calculation (min): 60 min  Short Term Goals: Week 2: SLP Short Term Goal 1 (Week 2): Patient will consume regular textures and thin liquids with intermittent supervision cues and no overt s/s of aspiration. SLP Short Term Goal 1 - Progress (Week 2): Progressing toward goal SLP Short Term Goal 2 (Week 2): Patient will verbally complete automatic sequences with supervision level assist phonemic cues. SLP Short Term Goal 2 - Progress (Week 2): Progressing toward goal SLP Short Term Goal 3 (Week 2): Patient will verbally produce phrase level (3-4 word) utterances to describe situations, pictures, etc. with mod assist verbal and written cues. SLP Short Term Goal 3 - Progress (Week 2): Progressing toward goal SLP Short Term Goal 4 (Week 2): Patient will self monitor accuracy of verbal expression with mod assist verbal and non-verbal cues. SLP Short Term Goal 4 - Progress (Week 2): Progressing toward goal SLP Short Term Goal 5 (Week 2): Patient will write by copying basic, biographical information with mod assist multi-modal cues to self monitor and correct errors. SLP Short Term Goal 5 - Progress (Week 2): Progressing toward goal  Skilled Therapeutic Interventions: Treatment session focused on addressing phrase level verbal expression to include articles, preposition, pronouns with written template, visual and verbal cues with min assist cues.  Patient copied biographical information with mod assist verbal and visual cues to self monitor and problem solve correcting errors.  Patient reported not liking food selections she has been recieving and SLP facilitated session with mod assist vebral and visual cues to order meals which SLP transcribed for paitent .     FIM:  Comprehension Comprehension Mode: Auditory Comprehension:  4-Understands basic 75 - 89% of the time/requires cueing 10 - 24% of the time Expression Expression Mode: Verbal Expression: 3-Expresses basic 50 - 74% of the time/requires cueing 25 - 50% of the time. Needs to repeat parts of sentences. Social Interaction Social Interaction: 4-Interacts appropriately 75 - 89% of the time - Needs redirection for appropriate language or to initiate interaction. Problem Solving Problem Solving: 4-Solves basic 75 - 89% of the time/requires cueing 10 - 24% of the time Memory Memory: 5-Recognizes or recalls 90% of the time/requires cueing < 10% of the time FIM - Eating Eating Activity: 5: Set-up assist for open containers  Pain Pain Assessment Pain Assessment: No/denies pain  Therapy/Group: Individual Therapy  Charlane Ferretti., CCC-SLP [phone]  Tamrah Victorino 07/06/2012, 4:37 PM

## 2012-07-06 NOTE — Progress Notes (Signed)
Advanced Heart Failure Rounding Note   Subjective:    46 yo woman with history of DM,  HTN, NICM,  EF <20%.  LV thrombus, and Grade 3 diastolic dysfunction.  Mod TR.   Discharged from [facility] 06/19/12 after being treated for acute systolic heart failure and LV thrombus. Discharged on loveneox and coumadin.   Presented to ED 06/20/12 with difficulty speaking and L sided weakness. CT of head negative. 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 Working with rehab. LLE strength and expressive aphasia improving some.   Last week Spironolactone 12.5 mg added. Renal function has remained stable. Weight up 3 pounds. INR 2.32. This am Dr Wynn Banker cut back lisinopril to 2.5 mg bid due to symptomatic hypotension (dizziness). ( SBP > 98/70).   Complaining of dizziness. Denies SOB/CP.   Objective:     Vital Signs:   Temp:  [98.2 F (36.8 C)-98.9 F (37.2 C)] 98.9 F (37.2 C) (11/04 0500) Pulse Rate:  [89-92] 89  (11/04 0500) Resp:  [18] 18  (11/04 0500) BP: (104-121)/(63-72) 104/63 mmHg (11/04 0500) SpO2:  [96 %-100 %] 96 % (11/04 0500) Weight:  [77.8 kg (171 lb 8.3 oz)] 77.8 kg (171 lb 8.3 oz) (11/04 0500) Last BM Date: 07/04/12  Weight change: Filed Weights   07/04/12 0700 07/05/12 0538 07/06/12 0500  Weight: 76.3 kg (168 lb 3.4 oz) 78 kg (171 lb 15.3 oz) 77.8 kg (171 lb 8.3 oz)    Intake/Output:   Intake/Output Summary (Last 24 hours) at 07/06/12 0840 Last data filed at 07/05/12 1853  Gross per 24 hour  Intake    820 ml  Output      0 ml  Net    820 ml     Physical Exam:            General:  Well appearing. No resp difficulty sitting in WC HEENT: L facial droop Neck: supple. JVP flat.  Carotids 2+ bilat; no bruits. No lymphadenopathy or thryomegaly appreciated. Cor: PMI nondisplaced. Regular.  No gallops, rubs, or murmurs. Lungs:  clear Abdomen: soft, nontender, nondistended. No hepatosplenomegaly. No bruits or masses. Good bowel sounds. Extremities: no cyanosis, clubbing, rash, edema L arm flaccid Neuro: still with expressive aphasia. Writing things down. Weak on L    Labs: Basic Metabolic Panel:  Lab 07/03/12 1610 07/01/12 0615  NA 132* 137  K 4.0 4.2  CL 94* 99  CO2 24 26  GLUCOSE 195* 179*  BUN 17 19  CREATININE 0.45* 0.53  CALCIUM 9.9 10.1  MG -- --  PHOS -- --    Liver Function Tests: No results found for this basename: AST:5,ALT:5,ALKPHOS:5,BILITOT:5,PROT:5,ALBUMIN:5 in the last 168 hours No results found for this basename: LIPASE:5,AMYLASE:5 in the last 168 hours No results found for this basename: AMMONIA:3 in the last 168 hours  CBC: No results found for this basename: WBC:5,NEUTROABS:5,HGB:5,HCT:5,MCV:5,PLT:5 in the last 168 hours  Cardiac Enzymes: No results found for this basename: CKTOTAL:5,CKMB:5,CKMBINDEX:5,TROPONINI:5 in the last 168 hours  BNP: BNP (last 3 results)  Basename 06/17/12 0455 06/15/12 1720  PROBNP 1930.0* 6932.0*       Imaging: No results found.   Medications:     Scheduled Medications:    . carvedilol  3.125 mg Oral BID WC  . digoxin  0.125 mg Oral Daily  . gabapentin  200 mg Oral TID  . influenza  inactive virus vaccine  0.5 mL Intramuscular Tomorrow-1000  . insulin aspart  0-15 Units Subcutaneous TID WC  . lisinopril  2.5 mg Oral BID  . metFORMIN  1,000 mg Oral BID WC  . pantoprazole  40 mg Oral QHS  . spironolactone  12.5 mg Oral Daily  . [COMPLETED] warfarin  5 mg Oral ONCE-1800  . Warfarin - Pharmacist Dosing Inpatient   Does not apply q1800  . [DISCONTINUED] lisinopril  5 mg Oral BID    Infusions:    PRN Medications: acetaminophen, albuterol, Muscle Rub, ondansetron (ZOFRAN) IV, ondansetron, polyethylene glycol, sorbitol   Assessment:   1. CVA, acute hemorrhagic R MCA    --c/b expressive aphasia and L-sided hemiparesis 2. 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 NICM, EF <20% 4.  LV thrombus  5. DM   6. Hypertension  Plan/Discussion:   Volume status stable. Continue current HF medications. Will not titrate ace at this time due to dizziness. Renal function stable. Repeat ECHO tomorrow. Once discharged she will need follow in HF clinic within one week of discharge.   Appreciate Rehab team's work.  CLEGG,AMY,NP-C 8:40 AM  Patient seen and examined with Tonye Becket, NP. We discussed all aspects of the encounter. I agree with the assessment and plan as stated above.   Stable from HF perspective. Ok with cutting back lisinopril.Will check echo tomorrow.  Sherlon Nied,MD 2:55 PM

## 2012-07-06 NOTE — Progress Notes (Signed)
Patient ID: Margaret Hess, female   DOB: 16-Mar-1966, 46 y.o.   MRN: [id number] Patient ID: Margaret Hess, female   DOB: April 15, 1966, 46 y.o.   MRN: [id number]  Subjective/Complaints: PT notes occ dizziness when up for 2-3 min.  Pt states she feels "dizzy" Reviewed BPs Review of Systems  Unable to perform ROS: medical condition   Objective: Vital Signs: Blood pressure 104/63, pulse 89, temperature 98.9 F (37.2 C), temperature source Oral, resp. rate 18, height 5\' 6"  (1.676 m), weight 77.8 kg (171 lb 8.3 oz), SpO2 96.00%. No results found. Results for orders placed during the hospital encounter of 06/25/12 (from the past 72 hour(s))  GLUCOSE, CAPILLARY     Status: Abnormal   Collection Time   07/03/12 12:04 PM      Component Value Range Comment   Glucose-Capillary 179 (*) 70 - 99 mg/dL   GLUCOSE, CAPILLARY     Status: Abnormal   Collection Time   07/03/12  4:27 PM      Component Value Range Comment   Glucose-Capillary 157 (*) 70 - 99 mg/dL   GLUCOSE, CAPILLARY     Status: Abnormal   Collection Time   07/03/12  8:50 PM      Component Value Range Comment   Glucose-Capillary 125 (*) 70 - 99 mg/dL   PROTIME-INR     Status: Abnormal   Collection Time   07/04/12  6:40 AM      Component Value Range Comment   Prothrombin Time 28.3 (*) 11.6 - 15.2 seconds    INR 2.83 (*) 0.00 - 1.49   GLUCOSE, CAPILLARY     Status: Abnormal   Collection Time   07/04/12 12:08 PM      Component Value Range Comment   Glucose-Capillary 211 (*) 70 - 99 mg/dL   GLUCOSE, CAPILLARY     Status: Abnormal   Collection Time   07/04/12  4:44 PM      Component Value Range Comment   Glucose-Capillary 106 (*) 70 - 99 mg/dL   GLUCOSE, CAPILLARY     Status: Abnormal   Collection Time   07/04/12  9:11 PM      Component Value Range Comment   Glucose-Capillary 133 (*) 70 - 99 mg/dL    Comment 1 Notify RN     PROTIME-INR     Status: Abnormal   Collection Time   07/05/12  6:45 AM      Component Value Range Comment   Prothrombin Time 25.2 (*) 11.6 - 15.2 seconds    INR 2.42 (*) 0.00 - 1.49   GLUCOSE, CAPILLARY     Status: Abnormal   Collection Time   07/05/12  7:29 AM      Component Value Range Comment   Glucose-Capillary 144 (*) 70 - 99 mg/dL   GLUCOSE, CAPILLARY     Status: Abnormal   Collection Time   07/05/12 11:21 AM      Component Value Range Comment   Glucose-Capillary 141 (*) 70 - 99 mg/dL   GLUCOSE, CAPILLARY     Status: Abnormal   Collection Time   07/05/12  4:26 PM      Component Value Range Comment   Glucose-Capillary 134 (*) 70 - 99 mg/dL   GLUCOSE, CAPILLARY     Status: Abnormal   Collection Time   07/05/12  8:46 PM      Component Value Range Comment   Glucose-Capillary 120 (*) 70 - 99 mg/dL    Comment 1 Notify RN  HEENT: normal Cardio: RRR Resp: CTA B/L GI: BS positive Extremity:  No Edema Skin:   Intact Neuro: Abnormal Sensory absent light touch and proprio, positive deep pain, Abnormal Motor 0/5 in LUE and LLE, Abnormal FMC Tone  reduced on L side, Tone:  Hypotonia, Aphasic, Inattention and Apraxic Musc/Skel:  Swelling L dorsum hand ecchymosis and swelling-mild Increased L hamstrings and hip adductor tone--unchanged  Assessment/Plan: 1. Functional deficits secondary to R MCA infarct L HP, crossed aphasia,apraxia  which require 3+ hours per day of interdisciplinary therapy in a comprehensive inpatient rehab setting. Physiatrist is providing close team supervision and 24 hour management of active medical problems listed below. Physiatrist and rehab team continue to assess barriers to discharge/monitor patient progress toward functional and medical goals. FIM: FIM - Bathing Bathing Steps Patient Completed: Chest;Right Arm;Left Arm;Abdomen;Front perineal area;Right upper leg;Left upper leg;Right lower leg (including foot) Bathing: 4: Min-Patient completes 8-9 83f 10 parts or 75+ percent  FIM - Upper Body Dressing/Undressing Upper body dressing/undressing steps patient  completed: Thread/unthread right sleeve of pullover shirt/dresss;Put head through opening of pull over shirt/dress;Pull shirt over trunk Upper body dressing/undressing: 4: Min-Patient completed 75 plus % of tasks FIM - Lower Body Dressing/Undressing Lower body dressing/undressing steps patient completed: Thread/unthread right underwear leg;Thread/unthread left underwear leg;Thread/unthread right pants leg;Thread/unthread left pants leg Lower body dressing/undressing: 2: Max-Patient completed 25-49% of tasks  FIM - Toileting Toileting steps completed by patient: Performs perineal hygiene Toileting Assistive Devices: Grab bar or rail for support Toileting: 2: Max-Patient completed 1 of 3 steps  FIM - Diplomatic Services operational officer Devices: Therapist, music Transfers: 1-Two helpers  FIM - Banker Devices: Arm rests Bed/Chair Transfer: 3: Supine > Sit: Mod A (lifting assist/Pt. 50-74%/lift 2 legs;4: Sit > Supine: Min A (steadying pt. > 75%/lift 1 leg)  FIM - Locomotion: Wheelchair Locomotion: Wheelchair: 5: Travels 50 - 149 ft, turns around, maneuvers to table, bed or toilet, negotiates 3% grade: modified independent FIM - Locomotion: Ambulation Locomotion: Ambulation Assistive Devices: Parallel bars Ambulation/Gait Assistance: 3: Mod assist Locomotion: Ambulation: 1: Travels less than 50 ft with moderate assistance (Pt: 50 - 74%)  Comprehension Comprehension Mode: Auditory Comprehension: 5-Follows basic conversation/direction: With no assist  Expression Expression Mode: Verbal Expression: 4-Expresses basic 75 - 89% of the time/requires cueing 10 - 24% of the time. Needs helper to occlude trach/needs to repeat words.  Social Interaction Social Interaction Mode: Asleep Social Interaction: 4-Interacts appropriately 75 - 89% of the time - Needs redirection for appropriate language or to initiate interaction.  Problem Solving Problem  Solving Mode: Asleep Problem Solving: 4-Solves basic 75 - 89% of the time/requires cueing 10 - 24% of the time  Memory Memory Mode: Asleep Memory: 5-Recognizes or recalls 90% of the time/requires cueing < 10% of the time  Medical Problem List and Plan:  1. 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 DVT Prophylaxis/Anticoagulation: Chronic Coumadin therapy with a goal INR of 2-2.5 secondary hemorrhagic transformation  3. Neuropsych: This patient is not capable of making decisions on his/her own behalf.  4. Hypertension/nonischemic cardiomyopathy with grade 3 diastolic dysfunction.Reduce  Lisinopril to  2.5 mg twice a day, Lanoxin 0.125 mg daily. Monitor with increased mobility. Cardiology to follow up as needed(Dr. Bensimhon), TED hose when OOB  5. Diabetes mellitus. Patient currently on sliding scale insulin. Patient on Glucophage 500 mg twice a day prior to admission and victoza 1.2 mL subcutaneously daily. Check blood  sugars a.c. and at bedtime, Is on hi dose metformin 1000mg  BID with controlled CBGs.  If this fails then restart Victoza--improved control at present 6. Mood/depression. Patient on Wellbutrin prior to admission 75 mg daily. We'll discuss resuming this medication. Provide emotional support and positive reinforcement  7. 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  Back pain mild/mod will use K pad and analgesic balm  -encouraged oob, lumbar roll as well   LOS (Days) 11 A FACE TO FACE EVALUATION WAS PERFORMED  Rohil Lesch E 07/06/2012, 8:09 AM

## 2012-07-07 ENCOUNTER — Inpatient Hospital Stay (HOSPITAL_COMMUNITY): Payer: BC Managed Care – PPO | Admitting: Occupational Therapy

## 2012-07-07 ENCOUNTER — Inpatient Hospital Stay (HOSPITAL_COMMUNITY): Payer: BC Managed Care – PPO | Admitting: Speech Pathology

## 2012-07-07 ENCOUNTER — Inpatient Hospital Stay (HOSPITAL_COMMUNITY): Payer: BC Managed Care – PPO | Admitting: *Deleted

## 2012-07-07 ENCOUNTER — Inpatient Hospital Stay (HOSPITAL_COMMUNITY): Payer: BC Managed Care – PPO

## 2012-07-07 DIAGNOSIS — G8110 Spastic hemiplegia affecting unspecified side: Secondary | ICD-10-CM

## 2012-07-07 DIAGNOSIS — E119 Type 2 diabetes mellitus without complications: Secondary | ICD-10-CM

## 2012-07-07 DIAGNOSIS — I69991 Dysphagia following unspecified cerebrovascular disease: Secondary | ICD-10-CM

## 2012-07-07 DIAGNOSIS — I509 Heart failure, unspecified: Secondary | ICD-10-CM

## 2012-07-07 LAB — GLUCOSE, CAPILLARY: Glucose-Capillary: 140 mg/dL — ABNORMAL HIGH (ref 70–99)

## 2012-07-07 LAB — PROTIME-INR: Prothrombin Time: 25.6 seconds — ABNORMAL HIGH (ref 11.6–15.2)

## 2012-07-07 NOTE — Progress Notes (Signed)
Speech Language Pathology Daily Session Note & Weekly Progress Summary  Patient Details  Name: Margaret Hess MRN: [id number] Date of Birth: 07/26/1966  Today's Date: 07/07/2012 Time: 0930-1030 Time Calculation (min): 60 min  Short Term Goals: Week 2: SLP Short Term Goal 1 (Week 2): Patient will consume regular textures and thin liquids with intermittent supervision cues and no overt s/s of aspiration. SLP Short Term Goal 1 - Progress (Week 2): Progressing toward goal SLP Short Term Goal 2 (Week 2): Patient will verbally complete automatic sequences with supervision level assist phonemic cues. SLP Short Term Goal 2 - Progress (Week 2): Progressing toward goal SLP Short Term Goal 3 (Week 2): Patient will verbally produce phrase level (3-4 word) utterances to describe situations, pictures, etc. with mod assist verbal and written cues. SLP Short Term Goal 3 - Progress (Week 2): Progressing toward goal SLP Short Term Goal 4 (Week 2): Patient will self monitor accuracy of verbal expression with mod assist verbal and non-verbal cues. SLP Short Term Goal 4 - Progress (Week 2): Progressing toward goal SLP Short Term Goal 5 (Week 2): Patient will write by copying basic, biographical information with mod assist multi-modal cues to self monitor and correct errors. SLP Short Term Goal 5 - Progress (Week 2): Progressing toward goal  Skilled Therapeutic Interventions: Treatment session focused on addressing phrase level verbal expression to include articles, prepositions, pronouns and helping verbs with written template, visual and verbal cues with min assist cues to identify and self correct errors. Patient initiated discussion yesterday regarding using ipad; in today's session patient attempted to type thinking that the writing was harder.  SLP educated regarding aphasia and language /symbol system being impacted in all modes of communication.  Patient expressed frustration but also acknowledged that she  better understands the hierarchy we are using (copying, faded to generation of some words).    FIM:  Comprehension Comprehension Mode: Auditory Comprehension: 5-Understands basic 90% of the time/requires cueing < 10% of the time Expression Expression Mode: Verbal Expression: 3-Expresses basic 50 - 74% of the time/requires cueing 25 - 50% of the time. Needs to repeat parts of sentences. Social Interaction Social Interaction: 5-Interacts appropriately 90% of the time - Needs monitoring or encouragement for participation or interaction. Problem Solving Problem Solving: 5-Solves basic 90% of the time/requires cueing < 10% of the time Memory Memory: 5-Recognizes or recalls 90% of the time/requires cueing < 10% of the time  Pain Pain Assessment Pain Assessment: No/denies pain Pain Score: 0-No pain  Therapy/Group: Individual Therapy   Speech Language Pathology Weekly Progress Note  Patient Details  Name: Margaret Hess MRN: [id number] Date of Birth: [birth year]  Today's Date: 07/07/2012  Short Term Goals: Week 2: SLP Short Term Goal 1 (Week 2): Patient will consume regular textures and thin liquids with intermittent supervision cues and no overt s/s of aspiration. SLP Short Term Goal 1 - Progress (Week 2): Met SLP Short Term Goal 2 (Week 2): Patient will verbally complete automatic sequences with supervision level assist phonemic cues. SLP Short Term Goal 2 - Progress (Week 2): Progressing toward goal SLP Short Term Goal 3 (Week 2): Patient will verbally produce phrase level (3-4 word) utterances to describe situations, pictures, etc. with mod assist verbal and written cues. SLP Short Term Goal 3 - Progress (Week 2): Met SLP Short Term Goal 4 (Week 2): Patient will self monitor accuracy of verbal expression with mod assist verbal and non-verbal cues. SLP Short Term Goal 4 - Progress (Week 2): Met SLP  Short Term Goal 5 (Week 2): Patient will write by copying basic, biographical  information with mod assist multi-modal cues to self monitor and correct errors. SLP Short Term Goal 5 - Progress (Week 2): Met Week 3: SLP Short Term Goal 1 (Week 3): Patient will verbally complete automatic sequences with supervision level assist phonemic cues. SLP Short Term Goal 2 (Week 3): Patient will verbally produce phrase level (3-4 word) utterances to describe situations, pictures, etc. with min assist verbal and written cues.  SLP Short Term Goal 3 (Week 3): Patient will self monitor accuracy of verbal expression with min assist verbal and non-verbal cues. SLP Short Term Goal 4 (Week 3): Patient will write by copying basic, biographical information with min assist multi-modal cues to self monitor and correct errors. SLP Short Term Goal 5 (Week 3): During non-structed verbal expression tasks patient with produce grammaticcaly correct phrases with max assist multi-modal cues.  Weekly Progress Updates: Patient met 4 out of 5 short term goals this reporting period with gains in mastication, vebral expression and written expression.  Patient has met and completed all dysphagia goals.  Patient continues to require overall min assist cues to consistently self monitor and correct verbal expression errors and mod assist with basic written information.  SLP added a goal to carryover grammatical structures into non-structred verbal expression.  Overall, mixed aphasia persists and patient would continue to benefot from skilled SLP services to [facility] her funcitonal communication ability prior to discharge.   SLP Frequency: 1-2 X/day, 30-60 minutes;5 out of 7 days Estimated Length of Stay: anticipated discharge 11/21  SLP Treatment/Interventions: Cognitive remediation/compensation;Cueing hierarchy;Environmental controls;Functional tasks;Internal/external aids;Patient/family education;Speech/Language facilitation;Therapeutic Activities  Charlane Ferretti., CCC-SLP [phone]  Khylei Wilms 07/07/2012,  11:27 AM

## 2012-07-07 NOTE — Progress Notes (Signed)
Physical Therapy Session Note  Patient Details  Name: Margaret Hess MRN: [id number] Date of Birth: 02/26/1966  Today's Date: 07/07/2012 Time: 1122-1205 Time Calculation (min): 43 min  Short Term Goals: Week 2:  PT Short Term Goal 1 (Week 2): Pt will perform rolling R after  2 VCs with supervision. PT Short Term Goal 2 (Week 2): Pt will perform basic transfer with min assist. PT Short Term Goal 3 (Week 2): Pt will stand during functional activity x 10 minutes with mod assist for balance. PT Short Term Goal 4 (Week 2): Pt will perform gait x 10' with LRAD, assist of 1 person. PT Short Term Goal 5 (Week 2): Pt will propel w/c 150' with supervision, with 0-1 Vc for route finding.  Skilled Therapeutic Interventions/Progress Updates:  neuromuscular re-education via mirror feedback, forced use, manual and tactile cues, for: -L knee flexion /extension in sitting, working toward being able to move L foot off/on L footrest; minimal success, even with friction reduced by pillow case under foot -standing at hall bar on R, working on L stance stability, wearing L Allard AFO, L knee hyperextension brace -gait with hall bar, x 15' forward and backward with max assist, focusing on L foot placement, L stance stability, and gait with L PFRW x 15' forward, as above  Pt tearful during PT, frustrated with difficulties with gait.  Emotional support and encouragement provided.     Therapy Documentation Precautions:  Precautions Precautions: Fall Precaution Comments: aphasic Restrictions Weight Bearing Restrictions: No Pain: Pain Assessment Pain Assessment: No/denies pain   Locomotion : Ambulation Ambulation/Gait Assistance: 2: Max assist     See FIM for current functional status  Therapy/Group: Individual Therapy  [name] 07/07/2012, 3:59 PM

## 2012-07-07 NOTE — Progress Notes (Signed)
Patient ID: Margaret Hess, female   DOB: [birth year], 46 y.o.   MRN: [id number]  Subjective/Complaints: No dizziness yesterday, "hot" last noc, no pains, no cough Reviewed BPs Review of Systems  Unable to perform ROS: medical condition   Objective: Vital Signs: Blood pressure 107/73, pulse 90, temperature 98.3 F (36.8 C), temperature source Oral, resp. rate 18, height 5\' 6"  (1.676 m), weight 82.6 kg (182 lb 1.6 oz), SpO2 96.00%. No results found. Results for orders placed during the hospital encounter of 06/25/12 (from the past 72 hour(s))  GLUCOSE, CAPILLARY     Status: Abnormal   Collection Time   07/04/12 12:08 PM      Component Value Range Comment   Glucose-Capillary 211 (*) 70 - 99 mg/dL   GLUCOSE, CAPILLARY     Status: Abnormal   Collection Time   07/04/12  4:44 PM      Component Value Range Comment   Glucose-Capillary 106 (*) 70 - 99 mg/dL   GLUCOSE, CAPILLARY     Status: Abnormal   Collection Time   07/04/12  9:11 PM      Component Value Range Comment   Glucose-Capillary 133 (*) 70 - 99 mg/dL    Comment 1 Notify RN     PROTIME-INR     Status: Abnormal   Collection Time   07/05/12  6:45 AM      Component Value Range Comment   Prothrombin Time 25.2 (*) 11.6 - 15.2 seconds    INR 2.42 (*) 0.00 - 1.49   GLUCOSE, CAPILLARY     Status: Abnormal   Collection Time   07/05/12  7:29 AM      Component Value Range Comment   Glucose-Capillary 144 (*) 70 - 99 mg/dL   GLUCOSE, CAPILLARY     Status: Abnormal   Collection Time   07/05/12 11:21 AM      Component Value Range Comment   Glucose-Capillary 141 (*) 70 - 99 mg/dL   GLUCOSE, CAPILLARY     Status: Abnormal   Collection Time   07/05/12  4:26 PM      Component Value Range Comment   Glucose-Capillary 134 (*) 70 - 99 mg/dL   GLUCOSE, CAPILLARY     Status: Abnormal   Collection Time   07/05/12  8:46 PM      Component Value Range Comment   Glucose-Capillary 120 (*) 70 - 99 mg/dL    Comment 1 Notify RN     GLUCOSE, CAPILLARY      Status: Abnormal   Collection Time   07/06/12  7:25 AM      Component Value Range Comment   Glucose-Capillary 155 (*) 70 - 99 mg/dL   PROTIME-INR     Status: Abnormal   Collection Time   07/06/12  7:30 AM      Component Value Range Comment   Prothrombin Time 24.4 (*) 11.6 - 15.2 seconds    INR 2.32 (*) 0.00 - 1.49   GLUCOSE, CAPILLARY     Status: Abnormal   Collection Time   07/06/12 11:51 AM      Component Value Range Comment   Glucose-Capillary 134 (*) 70 - 99 mg/dL    Comment 1 Notify RN     GLUCOSE, CAPILLARY     Status: Abnormal   Collection Time   07/06/12  4:29 PM      Component Value Range Comment   Glucose-Capillary 152 (*) 70 - 99 mg/dL   GLUCOSE, CAPILLARY     Status: Abnormal  Collection Time   07/06/12  8:54 PM      Component Value Range Comment   Glucose-Capillary 132 (*) 70 - 99 mg/dL   GLUCOSE, CAPILLARY     Status: Abnormal   Collection Time   07/07/12  7:23 AM      Component Value Range Comment   Glucose-Capillary 140 (*) 70 - 99 mg/dL    Comment 1 Notify RN        HEENT: normal Cardio: RRR Resp: CTA B/L GI: BS positive Extremity:  No Edema Skin:   Intact Neuro: Abnormal Sensory absent light touch and proprio, positive deep pain, Abnormal Motor 0/5 in LUE and LLE, Abnormal FMC Tone  reduced on L side, Tone:  Hypotonia, Aphasic, Inattention and Apraxic Musc/Skel:  Swelling L dorsum hand ecchymosis and swelling-mild Increased L hamstrings and hip adductor tone--unchanged  Assessment/Plan: 1. Functional deficits secondary to R MCA infarct L HP, crossed aphasia,apraxia  which require 3+ hours per day of interdisciplinary therapy in a comprehensive inpatient rehab setting. Physiatrist is providing close team supervision and 24 hour management of active medical problems listed below. Physiatrist and rehab team continue to assess barriers to discharge/monitor patient progress toward functional and medical goals. FIM: FIM - Bathing Bathing Steps Patient  Completed: Chest;Right Arm;Left Arm;Abdomen;Front perineal area;Right upper leg;Left upper leg;Right lower leg (including foot) Bathing: 4: Min-Patient completes 8-9 105f 10 parts or 75+ percent  FIM - Upper Body Dressing/Undressing Upper body dressing/undressing steps patient completed: Thread/unthread right sleeve of pullover shirt/dresss;Put head through opening of pull over shirt/dress;Pull shirt over trunk Upper body dressing/undressing: 4: Min-Patient completed 75 plus % of tasks FIM - Lower Body Dressing/Undressing Lower body dressing/undressing steps patient completed: Thread/unthread right underwear leg;Thread/unthread left underwear leg;Thread/unthread right pants leg;Thread/unthread left pants leg Lower body dressing/undressing: 2: Max-Patient completed 25-49% of tasks  FIM - Toileting Toileting steps completed by patient: Performs perineal hygiene Toileting Assistive Devices: Grab bar or rail for support Toileting: 2: Max-Patient completed 1 of 3 steps  FIM - Diplomatic Services operational officer Devices: Grab bars Toilet Transfers: 3-To toilet/BSC: Mod A (lift or lower assist);3-From toilet/BSC: Mod A (lift or lower assist)  FIM - Bed/Chair Transfer Bed/Chair Transfer Assistive Devices: Arm rests Bed/Chair Transfer: 4: Supine > Sit: Min A (steadying Pt. > 75%/lift 1 leg);4: Sit > Supine: Min A (steadying pt. > 75%/lift 1 leg);3: Bed > Chair or W/C: Mod A (lift or lower assist);3: Chair or W/C > Bed: Mod A (lift or lower assist)  FIM - Locomotion: Wheelchair Locomotion: Wheelchair: 5: Travels 50 - 149 ft, turns around, maneuvers to table, bed or toilet, negotiates 3% grade: modified independent FIM - Locomotion: Ambulation Locomotion: Ambulation Assistive Devices: Parallel bars Ambulation/Gait Assistance: 3: Mod assist Locomotion: Ambulation: 1: Travels less than 50 ft with moderate assistance (Pt: 50 - 74%)  Comprehension Comprehension Mode: Auditory Comprehension:  4-Understands basic 75 - 89% of the time/requires cueing 10 - 24% of the time  Expression Expression Mode: Verbal Expression: 3-Expresses basic 50 - 74% of the time/requires cueing 25 - 50% of the time. Needs to repeat parts of sentences.  Social Interaction Social Interaction Mode: Asleep Social Interaction: 4-Interacts appropriately 75 - 89% of the time - Needs redirection for appropriate language or to initiate interaction.  Problem Solving Problem Solving Mode: Asleep Problem Solving: 4-Solves basic 75 - 89% of the time/requires cueing 10 - 24% of the time  Memory Memory Mode: Asleep Memory: 5-Recognizes or recalls 90% of the time/requires cueing < 10%  of the time  Medical Problem List and Plan:  1. 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 DVT Prophylaxis/Anticoagulation: Chronic Coumadin therapy with a goal INR of 2-2.5 secondary hemorrhagic transformation  3. Neuropsych: This patient is not capable of making decisions on his/her own behalf.  4. Hypertension/nonischemic cardiomyopathy with grade 3 diastolic dysfunction.Reduce  Lisinopril to  2.5 mg twice a day, Lanoxin 0.125 mg daily. Monitor with increased mobility. Cardiology to follow up as needed(Dr. Bensimhon), TED hose when OOB  5. Diabetes mellitus. Patient currently on sliding scale insulin. Patient on Glucophage 500 mg twice a day prior to admission and victoza 1.2 mL subcutaneously daily. Check blood sugars a.c. and at bedtime, Is on hi dose metformin 1000mg  BID with controlled CBGs.  If this fails then restart Victoza--improved control at present 6. Mood/depression. Patient on Wellbutrin prior to admission 75 mg daily. We'll discuss resuming this medication. Provide emotional support and positive reinforcement  7. 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  Back pain  mild/mod will use K pad and analgesic balm  -encouraged oob, lumbar roll as well   LOS (Days) 12 A FACE TO FACE EVALUATION WAS PERFORMED  Margaret Hess 07/07/2012, 7:55 AM

## 2012-07-07 NOTE — Progress Notes (Signed)
ANTICOAGULATION CONSULT NOTE - Follow Up Consult  Pharmacy Consult for Coumadin Indication: LV thrombus  No Known Allergies  Patient Measurements: Height: 5\' 6"  (167.6 cm) Weight: 182 lb 1.6 oz (82.6 kg) IBW/kg (Calculated) : 59.3   Vital Signs: Temp: 98.3 F (36.8 C) (11/05 0502) Temp src: Oral (11/05 0502) BP: 107/73 mmHg (11/05 0502) Pulse Rate: 90  (11/05 0502)  Labs:  Basename 07/07/12 0720 07/06/12 0730 07/05/12 0645  HGB -- -- --  HCT -- -- --  PLT -- -- --  APTT -- -- --  LABPROT 25.6* 24.4* 25.2*  INR 2.47* 2.32* 2.42*  HEPARINUNFRC -- -- --  CREATININE -- -- --  CKTOTAL -- -- --  CKMB -- -- --  TROPONINI -- -- --    Estimated Creatinine Clearance: 95.2 ml/min (by C-G formula based on Cr of 0.45).  Assessment: 45yof discharged from [facility] on 10/18 on coumadin 5mg  daily and lovenox 80mg  q12 for new LV thrombus. INR 2.15 at time of discharge after only 2 doses of coumadin. Returns to the hospital that same night with difficulty speaking and L sided weakness. 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   Coumadin continued (ok by neuro) at home dose of 5mg  daily. INR is now within her adjusted goal after decreasing dose 11/1 and 11/2.  Goal of Therapy:  INR 2-2.5 (lower goal secondary to hemorrhagic transformation of CVA)   Plan:  Continue Coumadin 4mg  daily Continue daily PT/INR checks  [name], Pharm.D., BCPS Clinical Pharmacist  Phone [phone] Pager [phone] 07/07/2012, 10:03 AM

## 2012-07-07 NOTE — Progress Notes (Signed)
Visitor requests note to medical staff:  1430 "Brain Velt" (visitor) called nurses station to notify nursing staff of possibility of exposure to infection.  Visitor was diagnosed with "contagious bronchitis" and last visited patient on 11/3.

## 2012-07-07 NOTE — Discharge Summary (Signed)
Physician Discharge Summary  Patient ID: Margaret Hess MRN: [id number] DOB/AGE: 46/23/1967 46 y.o.  Admit date: 06/19/2012 Discharge date: 06/25/2012  Primary Care Physician:  Cala Bradford, MD   Discharge Diagnoses:    Principal Problem:  *Stroke, embolic with hemorrhagic change Active Problems:  HTN (hypertension)  LV (left ventricular) mural thrombus   Diabetes mellitus  Nonischemic cardiomyopathy  Cardiomyopathy  Chronic systolic heart failure      Medication List     As of 07/07/2012  6:19 PM    TAKE these medications         buPROPion 75 MG tablet   Commonly known as: WELLBUTRIN   Take 75 mg by mouth daily.      carvedilol 3.125 MG tablet   Commonly known as: COREG   Take 1 tablet (3.125 mg total) by mouth 2 (two) times daily with a meal.      digoxin 0.25 MG tablet   Commonly known as: LANOXIN   Take 1 tablet (0.25 mg total) by mouth daily.      docusate sodium 100 MG capsule   Commonly known as: COLACE   Take 1 capsule (100 mg total) by mouth 2 (two) times daily.      DULoxetine 20 MG capsule   Commonly known as: CYMBALTA   Take 20 mg by mouth daily.                  ferrous sulfate 325 (65 FE) MG tablet   Take 1 tablet (325 mg total) by mouth 2 (two) times daily.      furosemide 20 MG tablet   Commonly known as: LASIX   Take 1 tablet (20 mg total) by mouth daily.      lisinopril 10 MG tablet   Commonly known as: PRINIVIL,ZESTRIL   Take 1 tablet (10 mg total) by mouth 2 (two) times daily.      metFORMIN 500 MG tablet   Commonly known as: GLUCOPHAGE   Take 500 mg by mouth 2 (two) times daily with a meal.      potassium chloride SA 20 MEQ tablet   Commonly known as: K-DUR,KLOR-CON   Take 1 tablet (20 mEq total) by mouth 2 (two) times daily.      spironolactone 25 MG tablet   Commonly known as: ALDACTONE   Take 1 tablet (25 mg total) by mouth daily.      VICTOZA [place]   Inject 1.2 mLs into the skin daily.      warfarin 5 MG tablet    Commonly known as: COUMADIN   Take 1 tablet (5 mg total) by mouth daily.      zolpidem 10 MG tablet   Commonly known as: AMBIEN   Take 10 mg by mouth at bedtime as needed. For sleep         Disposition and Follow-up:  PCP in 1 week Dr.Sethi in 1 month Dr.Bensimhon in 2 weeks   Significant Diagnostic Studies:  No results found.  Brief H and P: Margaret Hess is a 46 year-old woman who was found on pervious admission to have an EF 17% and known LV thrombus who was discharged on 06/19/12 with Lovenox and Coumadin. She presented on 06/20/12 with stroke like symptoms including difficulty speaking and left sided weakness. EMS was called, and a code stroke was activated. Her symptoms initially resolved, but they did return, and she was admitted to the Neuro ICU for further evaluation. She was found to have a right MCA territory  ischemic infarct with hemorrhagic conversion and resultant mass effect. She required pressors to keep SBP >120-140.  She was transferred from the ICU team to [facility] on 10/22.  Consultants:  Neurology  Heart failure team    Hospital Course:  *Stroke, embolic with hemorrhagic change  From ventricular thrombus- appreciate neuro eval- cont coumadin per Neuro. CIR eval requested. INR goal 2-3. Based on Neuro recommendations, "Continue warfarin for secondary stroke prevention given mural thrombus (aware of hemorrhage, it does not need to be reversed, and given thrombus recommend continuing with warfarin. No indication for other bridging.)Patient is at high risk for hemorrhagic transformation into her stroke but is in a very tricky situation and the need for ongoing continuation of anticoagulation outweighs the risk of increasing hemorrhagic transformation with anticoagulation. Goal INR is 2.0-3.0. " To CIR for rehab  Active Problems:  LV (left ventricular) mural thrombus  Cont Coumadin- INR therapeutic   Nonischemic cardiomyopathy/ systolic and diastolic  dysfunction  EF less than 20 %, grade 3 diastolic dysfunction- followed by CHF service and diuresed initially, Continue digoxin and lisinopril,  coreg   HTN (hypertension) Cont Lisinopril    Time spent on Discharge:  Signed: Johnna Bollier [facility]  07/07/2012, 6:19 PM

## 2012-07-07 NOTE — Progress Notes (Signed)
Occupational Therapy Session Note  Patient Details  Name: Margaret Hess MRN: [id number] Date of Birth: 28-Jun-1966  Today's Date: 07/07/2012 Time: 0800-0900 Time Calculation (min): 60 min  Short Term Goals: Week 2:  OT Short Term Goal 1 (Week 2): Pt will be able to stand at sink with min assist to allow her to pull up her pants with min assist. OT Short Term Goal 2 (Week 2): Pt will bathe in shower with steady assist using long sponge. OT Short Term Goal 3 (Week 2): Pt will perform self ROM to LUE with min verbal cues. OT Short Term Goal 4 (Week 2): Pt will transfer to toilet with min assist.  Skilled Therapeutic Interventions/Progress Updates:  Patient in bed upon arrival. Engaged in self care retraining to include shower, dress and groom.  Focus session on management of LUE throughout session and ability to don pull over shirt with only verbal cues.  Patient required 5 verbal cues related to LUE management and wal able to don pull over shirt with mod verbal cues.  Patient also worked on sit><stands, sitting and standing balance, and hemi dressing techniques.  Therapy Documentation Precautions:  Precautions Precautions: Fall Precaution Comments: aphasic Restrictions Weight Bearing Restrictions: No Pain: Pain Assessment Pain Assessment: No/denies pain Pain Score: 0-No pain ADL:  See FIM for current functional status  Therapy/Group: Individual Therapy  [name] 07/07/2012, 11:31 AM

## 2012-07-07 NOTE — Evaluation (Signed)
Recreational Therapy Assessment and Plan  Patient Details  Name: Margaret Hess MRN: [id number] Date of Birth: 10-10-1965 Today's Date: 07/07/2012  Rehab Potential: Good ELOS: 3 weeks   Assessment Clinical Impression: Problem List:  Patient Active Problem List   Diagnosis   .  Acute systolic CHF (congestive heart failure), NYHA class 4   .  HTN (hypertension)   .  Anxiety   .  Moderate Bilateral pleural effusion   .  Dyspnea on exertion   .  LV (left ventricular) mural thrombus without MI   .  Chronic passive hepatic congestion   .  Acute respiratory failure with hypoxia   .  Positive D dimer   .  Abnormal coagulation profile   .  Diabetes mellitus   .  Leukocytosis   .  Pulmonary hypertension   .  Acute diastolic heart failure, NYHA class 3   .  Acute combined systolic and diastolic heart failure   .  Nonischemic cardiomyopathy   .  Stroke, embolic   .  Cardiomyopathy   .  Chronic systolic heart failure   .  CVA (cerebral infarction)    Past Medical History:  Past Medical History   Diagnosis  Date   .  Migraines    .  Hypertension    .  Diabetes mellitus     Past Surgical History: History reviewed. No pertinent past surgical history.  Assessment & Plan  Clinical Impression: Patient is a 46 y.o. year old female with recent admission to the hospital on 06/15/2012 with dyspnea found to have nonischemic cardiomyopathy with ejection fraction of 17%. An echocardiogram is completed showing severe global hypokinesis with normal left ventricular cavity size and a left ventricular apical thrombus as well as grade 3 diastolic dysfunction. The patient had an MRI on 06/17/2012 which confirmed a small apical left ventricular thrombus. The patient was placed on Coumadin therapy and discharge from the hospital 06/19/2012 1 once INR was therapeutic. She was also given a prescription for 3 days of cross coverage with Lovenox 80 mg twice daily. On 06/19/2012 she developed acute onset of  left-sided weakness and difficulty speaking. She was readmitted to the hospital 06/20/2012 with MRI of the brain showing acute right MCA infarct with cerebral edema, mild midline shift and hemorrhagic transformation. MRA of the head showed acute occlusion of the right M1 segment compatible with embolus causing right MCA infarction. Carotid Dopplers completed 06/21/2012 showing no significant ICA stenosis. Patient did not receive TPA.  Patient transferred to CIR on 06/25/2012. Pt presents with decreased activity tolerance, decreased functional mobility, decreased balance, left sided weakness, aphasia, left inattention limiting pt's independence with leisure/community pursuits.  Leisure History/Participation Premorbid leisure interest/current participation: Sports - Swimming;Community - Travel (Comment);Community - Journalist, newspaper - Engineer, civil (consulting) Expression Interests: Music (Comment);Dance Other Leisure Interests: Television;Reading;Cooking/Baking;Computer Leisure Participation Style: With Family/Friends;Alone Awareness of Community Resources: Good-identify 3 post discharge leisure resources Psychosocial / Spiritual Patient agreeable to Pet Therapy: Yes Does patient have pets?: Yes (cats) Social interaction - Mood/Behavior: Cooperative Firefighter Appropriate for Education?: Yes Patient Agreeable to Outing?: Yes Recreational Therapy Orientation Orientation -Reviewed with patient: Available activity resources Strengths/Weaknesses Patient Strengths/Abilities: Willingness to participate;Active premorbidly Patient weaknesses: Physical limitations  Plan Rec Therapy Plan Is patient appropriate for Therapeutic Recreation?: Yes Rehab Potential: Good Treatment times per week: Min 1 time per week >20 minutes Estimated Length of Stay: 3 weeks TR Treatment/Interventions: Adaptive equipment instruction;1:1 session;Balance/vestibular training;Community reintegration;Functional  mobility training;Patient/family education;Recreation/leisure participation;Therapeutic activities;Therapeutic exercise;UE/LE Coordination activities;Wheelchair  propulsion/positioning  Recommendations for other services: None  Discharge Criteria: Patient will be discharged from TR if patient refuses treatment 3 consecutive times without medical reason.  If treatment goals not met, if there is a change in medical status, if patient makes no progress towards goals or if patient is discharged from hospital.  The above assessment, treatment plan, treatment alternatives and goals were discussed and mutually agreed upon: by patient  Breionna Punt 07/07/2012, 4:28 PM

## 2012-07-07 NOTE — Progress Notes (Signed)
  Echocardiogram 2D Echocardiogram has been performed.  Margaret Hess 07/07/2012, 3:39 PM

## 2012-07-08 ENCOUNTER — Inpatient Hospital Stay (HOSPITAL_COMMUNITY): Payer: BC Managed Care – PPO | Admitting: *Deleted

## 2012-07-08 ENCOUNTER — Inpatient Hospital Stay (HOSPITAL_COMMUNITY): Payer: BC Managed Care – PPO | Admitting: Occupational Therapy

## 2012-07-08 ENCOUNTER — Inpatient Hospital Stay (HOSPITAL_COMMUNITY): Payer: BC Managed Care – PPO | Admitting: Speech Pathology

## 2012-07-08 ENCOUNTER — Inpatient Hospital Stay (HOSPITAL_COMMUNITY): Payer: BC Managed Care – PPO

## 2012-07-08 LAB — GLUCOSE, CAPILLARY
Glucose-Capillary: 143 mg/dL — ABNORMAL HIGH (ref 70–99)
Glucose-Capillary: 150 mg/dL — ABNORMAL HIGH (ref 70–99)

## 2012-07-08 LAB — BASIC METABOLIC PANEL
CO2: 25 mEq/L (ref 19–32)
Calcium: 10 mg/dL (ref 8.4–10.5)
Creatinine, Ser: 0.59 mg/dL (ref 0.50–1.10)
GFR calc Af Amer: >90 mL/min (ref >90)
GFR calc non Af Amer: >90 mL/min (ref >90)
Sodium: 138 mEq/L (ref 135–145)

## 2012-07-08 LAB — PROTIME-INR: Prothrombin Time: 28.1 seconds — ABNORMAL HIGH (ref 11.6–15.2)

## 2012-07-08 MED ORDER — TIZANIDINE HCL 2 MG PO TABS
2.0000 mg | ORAL_TABLET | Freq: Three times a day (TID) | ORAL | Status: DC
  Administered 2012-07-08 – 2012-07-15 (×20): 2 mg via ORAL
  Filled 2012-07-08 (×24): qty 1

## 2012-07-08 MED ORDER — TRAZODONE HCL 50 MG PO TABS
25.0000 mg | ORAL_TABLET | Freq: Every evening | ORAL | Status: DC | PRN
  Administered 2012-07-11 – 2012-07-13 (×3): 25 mg via ORAL
  Administered 2012-07-14 – 2012-07-21 (×7): 50 mg via ORAL
  Filled 2012-07-08 (×10): qty 1

## 2012-07-08 MED ORDER — WARFARIN SODIUM 1 MG PO TABS
1.0000 mg | ORAL_TABLET | Freq: Once | ORAL | Status: AC
  Administered 2012-07-08: 1 mg via ORAL
  Filled 2012-07-08: qty 1

## 2012-07-08 MED ORDER — FUROSEMIDE 40 MG PO TABS
40.0000 mg | ORAL_TABLET | Freq: Every day | ORAL | Status: DC
  Administered 2012-07-08 – 2012-07-17 (×10): 40 mg via ORAL
  Filled 2012-07-08 (×11): qty 1

## 2012-07-08 MED ORDER — GABAPENTIN 300 MG PO CAPS
300.0000 mg | ORAL_CAPSULE | Freq: Three times a day (TID) | ORAL | Status: DC
  Administered 2012-07-08 – 2012-07-10 (×6): 300 mg via ORAL
  Filled 2012-07-08 (×9): qty 1

## 2012-07-08 NOTE — Progress Notes (Signed)
Patient ID: Margaret Hess, female   DOB: [birth year], 46 y.o.   MRN: [id number]  Subjective/Complaints: Nerve pain L side , spasms Reviewed BPs Review of Systems  Unable to perform ROS: medical condition   Objective: Vital Signs: Blood pressure 110/72, pulse 87, temperature 98.2 F (36.8 C), temperature source Oral, resp. rate 18, height 5\' 6"  (1.676 m), weight 82.3 kg (181 lb 7 oz), SpO2 99.00%. No results found. Results for orders placed during the hospital encounter of 06/25/12 (from the past 72 hour(s))  GLUCOSE, CAPILLARY     Status: Abnormal   Collection Time   07/05/12  4:26 PM      Component Value Range Comment   Glucose-Capillary 134 (*) 70 - 99 mg/dL   GLUCOSE, CAPILLARY     Status: Abnormal   Collection Time   07/05/12  8:46 PM      Component Value Range Comment   Glucose-Capillary 120 (*) 70 - 99 mg/dL    Comment 1 Notify RN     GLUCOSE, CAPILLARY     Status: Abnormal   Collection Time   07/06/12  7:25 AM      Component Value Range Comment   Glucose-Capillary 155 (*) 70 - 99 mg/dL   PROTIME-INR     Status: Abnormal   Collection Time   07/06/12  7:30 AM      Component Value Range Comment   Prothrombin Time 24.4 (*) 11.6 - 15.2 seconds    INR 2.32 (*) 0.00 - 1.49   GLUCOSE, CAPILLARY     Status: Abnormal   Collection Time   07/06/12 11:51 AM      Component Value Range Comment   Glucose-Capillary 134 (*) 70 - 99 mg/dL    Comment 1 Notify RN     GLUCOSE, CAPILLARY     Status: Abnormal   Collection Time   07/06/12  4:29 PM      Component Value Range Comment   Glucose-Capillary 152 (*) 70 - 99 mg/dL   GLUCOSE, CAPILLARY     Status: Abnormal   Collection Time   07/06/12  8:54 PM      Component Value Range Comment   Glucose-Capillary 132 (*) 70 - 99 mg/dL   PROTIME-INR     Status: Abnormal   Collection Time   07/07/12  7:20 AM      Component Value Range Comment   Prothrombin Time 25.6 (*) 11.6 - 15.2 seconds    INR 2.47 (*) 0.00 - 1.49   GLUCOSE, CAPILLARY      Status: Abnormal   Collection Time   07/07/12  7:23 AM      Component Value Range Comment   Glucose-Capillary 140 (*) 70 - 99 mg/dL    Comment 1 Notify RN     GLUCOSE, CAPILLARY     Status: Abnormal   Collection Time   07/07/12 11:15 AM      Component Value Range Comment   Glucose-Capillary 142 (*) 70 - 99 mg/dL    Comment 1 Notify RN     GLUCOSE, CAPILLARY     Status: Abnormal   Collection Time   07/07/12  4:50 PM      Component Value Range Comment   Glucose-Capillary 112 (*) 70 - 99 mg/dL   GLUCOSE, CAPILLARY     Status: Abnormal   Collection Time   07/07/12  9:10 PM      Component Value Range Comment   Glucose-Capillary 130 (*) 70 - 99 mg/dL   PROTIME-INR  Status: Abnormal   Collection Time   07/08/12  6:30 AM      Component Value Range Comment   Prothrombin Time 28.1 (*) 11.6 - 15.2 seconds    INR 2.80 (*) 0.00 - 1.49   GLUCOSE, CAPILLARY     Status: Abnormal   Collection Time   07/08/12  7:48 AM      Component Value Range Comment   Glucose-Capillary 143 (*) 70 - 99 mg/dL    Comment 1 Notify RN        HEENT: normal Cardio: RRR Resp: CTA B/L GI: BS positive Extremity:  No Edema Skin:   Intact Neuro: Abnormal Sensory absent light touch and proprio, positive deep pain, Abnormal Motor 0/5 in LUE and LLE, Abnormal FMC Tone  reduced on L side, Tone:  Hypotonia, Aphasic, Inattention and Apraxic Musc/Skel:  Swelling L dorsum hand ecchymosis and swelling-mild Increased L hamstrings and hip adductor tone--unchanged  Assessment/Plan: 1. Functional deficits secondary to R MCA infarct L HP, crossed aphasia,apraxia  which require 3+ hours per day of interdisciplinary therapy in a comprehensive inpatient rehab setting. Physiatrist is providing close team supervision and 24 hour management of active medical problems listed below. Physiatrist and rehab team continue to assess barriers to discharge/monitor patient progress toward functional and medical goals. FIM: FIM -  Bathing Bathing Steps Patient Completed: Chest;Right Arm;Left Arm;Abdomen;Front perineal area;Right upper leg;Left upper leg;Right lower leg (including foot);Left lower leg (including foot) Bathing: 4: Min-Patient completes 8-9 6f 10 parts or 75+ percent  FIM - Upper Body Dressing/Undressing Upper body dressing/undressing steps patient completed: Thread/unthread right sleeve of pullover shirt/dresss;Thread/unthread left sleeve of pullover shirt/dress;Put head through opening of pull over shirt/dress;Pull shirt over trunk Upper body dressing/undressing: 5: Supervision: Safety issues/verbal cues FIM - Lower Body Dressing/Undressing Lower body dressing/undressing steps patient completed: Thread/unthread right underwear leg;Thread/unthread left underwear leg;Pull underwear up/down;Thread/unthread right pants leg;Thread/unthread left pants leg;Pull pants up/down;Don/Doff right shoe Lower body dressing/undressing: 3: Mod-Patient completed 50-74% of tasks  FIM - Toileting Toileting steps completed by patient: Performs perineal hygiene Toileting Assistive Devices: Grab bar or rail for support Toileting: 2: Max-Patient completed 1 of 3 steps  FIM - Diplomatic Services operational officer Devices: Grab bars Toilet Transfers: 4-To toilet/BSC: Min A (steadying Pt. > 75%);4-From toilet/BSC: Min A (steadying Pt. > 75%)  FIM - Bed/Chair Transfer Bed/Chair Transfer Assistive Devices: Arm rests Bed/Chair Transfer: 0: Activity did not occur  FIM - Locomotion: Wheelchair Locomotion: Wheelchair: 2: Travels 50 - 149 ft with supervision, cueing or coaxing FIM - Locomotion: Ambulation Locomotion: Ambulation Assistive Devices: Walker - Platform;Orthosis (L knee brace, L AFO ) Ambulation/Gait Assistance: 2: Max assist Locomotion: Ambulation: 1: Travels less than 50 ft with maximal assistance (Pt: 25 - 49%)  Comprehension Comprehension Mode: Auditory Comprehension: 5-Understands basic 90% of the  time/requires cueing < 10% of the time  Expression Expression Mode: Verbal Expression: 3-Expresses basic 50 - 74% of the time/requires cueing 25 - 50% of the time. Needs to repeat parts of sentences.  Social Interaction Social Interaction Mode: Asleep Social Interaction: 5-Interacts appropriately 90% of the time - Needs monitoring or encouragement for participation or interaction.  Problem Solving Problem Solving Mode: Asleep Problem Solving: 5-Solves basic 90% of the time/requires cueing < 10% of the time  Memory Memory Mode: Asleep Memory: 5-Recognizes or recalls 90% of the time/requires cueing < 10% of the time  Medical Problem List and Plan:  1. 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 DVT Prophylaxis/Anticoagulation: Chronic Coumadin therapy with a goal INR of 2-2.5 secondary hemorrhagic transformation  3. Neuropsych: This patient is not capable of making decisions on his/her own behalf.  4. Hypertension/nonischemic cardiomyopathy with grade 3 diastolic dysfunction.Reduce  Lisinopril to  2.5 mg twice a day, Lanoxin 0.125 mg daily. Monitor with increased mobility. Cardiology to follow up as needed(Dr. Bensimhon), TED hose when OOB  5. Diabetes mellitus. Patient currently on sliding scale insulin. Patient on Glucophage 500 mg twice a day prior to admission and victoza 1.2 mL subcutaneously daily. Check blood sugars a.c. and at bedtime, Is on hi dose metformin 1000mg  BID with controlled CBGs.  If this fails then restart Victoza--improved control at present 6. Mood/depression. Patient on Wellbutrin prior to admission 75 mg daily. We'll discuss resuming this medication. Provide emotional support and positive reinforcement  7. Sensory dysesthesia related to parietal infarct and hemorrhagic transformation, increased gabapentin- appears ineffective at current dose, increase to 300mg  8.  Back pain mild/mod will use K pad and analgesic  balm  -encouraged oob, lumbar roll as well 9.  Spasticity will start Zanaflex  LOS (Days) 13 A FACE TO FACE EVALUATION WAS PERFORMED  Cintya Daughety E 07/08/2012, 11:43 AM

## 2012-07-08 NOTE — Patient Care Conference (Signed)
Inpatient RehabilitationTeam Conference Note Date: 07/08/2012   Time: 10:50 AM    Patient Name: Margaret Hess      Medical Record Number: [id number]  Date of Birth: 1966/03/05 Sex: Female         Room/Bed: 4033/4033-01 Payor Info: Payor: BLUE CROSS BLUE SHIELD  Plan: [facility] HEALTH PPO  Product Type: *No Product type*     Admitting Diagnosis: R BKA  Admit Date/Time:  06/25/2012  3:56 PM Admission Comments: No comment available   Primary Diagnosis:  CVA (cerebral infarction) Principal Problem: CVA (cerebral infarction)  Patient Active Problem List   Diagnosis Date Noted  . CVA (cerebral infarction) 06/26/2012  . Chronic systolic heart failure 06/23/2012  . Cardiomyopathy 06/21/2012  . Stroke, embolic 06/20/2012  . Nonischemic cardiomyopathy 06/17/2012  . LV (left ventricular) mural thrombus without MI 06/16/2012  . Chronic passive hepatic congestion 06/16/2012  . Acute respiratory failure with hypoxia 06/16/2012  . Positive D dimer 06/16/2012  . Abnormal coagulation profile 06/16/2012  . Diabetes mellitus 06/16/2012  . Leukocytosis 06/16/2012  . Pulmonary hypertension 06/16/2012  . Acute diastolic heart failure, NYHA class 3 06/16/2012  . Acute combined systolic and diastolic heart failure 06/16/2012  . Acute systolic CHF (congestive heart failure), NYHA class 4 06/15/2012  . HTN (hypertension) 06/15/2012  . Anxiety 06/15/2012  . Moderate Bilateral pleural effusion 06/15/2012  . Dyspnea on exertion 06/15/2012    Expected Discharge Date: Expected Discharge Date: 07/23/12  Team Members Present: Physician: Dr. Claudette Laws Social Worker Present: Dossie Der, LCSW Nurse Present: Other (comment) Feliz Beam) PT Present: Edman Circle, PT;Caroline Adriana Simas, PT OT Present: Leonette Monarch, OT SLP Present: Fae Pippin, SLP Other (Discipline and Name): [facility] Noel-PPS Coordinator & Elnora Morrison RN     Current Status/Progress Goal Weekly Team Focus    Medical   Increasing flexor tone  in the upper and lower extremity, aphasia persists, poor endurance  Normalize tone in the left upper and left lower extremity  Adjust medications for spasticity as well as neuropathic pain   Bowel/Bladder   Continent of bowel, Can be incontinent of bladder at times  Continent of bowel and bladder  Q2 timed tolieting and prn   Swallow/Nutrition/ Hydration   regular textures, thin liquids intermittent staff supervision  least restrictive p.o. intake  Mod I utilization of compensatory strategies for precautions   ADL's   min-mod UB dressing, mod-max LB dressing, min bathing, mod-max toileting, min squat pivot transfer, mod standing  min bathing, LB dressing, toileting, supervision UB dressing, min transfers  ADL retraining, LUE neuro re-ed, functional mobility   Mobility   mod assist transfers, supervision w/c x 100', gait with L PFRW x 15' max assist, wearing L AFO and L knee cage brace  min assist transfers; supervision w/c mobility x 150', mod assist gait x 50' and up/down 4 steps   transfers, w/c propulsion, LLE neuro re-ed, gait   Communication   Mod A  Min A  verbal expression of wants and needs, grammatically complete 3-4 word utterances, accurate decoding of written text   Safety/Cognition/ Behavioral Observations  Min A  supervision  increased self monitoring and correcting of errors during verbal expressin    Pain   Scheduled neurotin left leg pain  pain level <or = 3  Monitor and treat pain prn   Skin   Brusie to abdomen and left arm  no new skin breakdown or infection  Monitor      *See Interdisciplinary Assessment and Plan and progress  notes for long and short-term goals  Barriers to Discharge: Will need 24 7 care    Possible Resolutions to Barriers:  Need to arrange caregiver education as well as establish whether the patient will be living post discharge    Discharge Planning/Teaching Needs:  Home to her house with sister assisting.   Has many friends who will also be assisting.  Pt's concern she will be too much care for them.      Team Discussion:  Making slow progress-language issue-neuro-psych to eval.  Trying squat/pivot transfers now safer.  Revisions to Treatment Plan:  None   Continued Need for Acute Rehabilitation Level of Care: The patient requires daily medical management by a physician with specialized training in physical medicine and rehabilitation for the following conditions: Daily direction of a multidisciplinary physical rehabilitation program to ensure safe treatment while eliciting the highest outcome that is of practical value to the patient.: Yes Daily medical management of patient stability for increased activity during participation in an intensive rehabilitation regime.: Yes Daily analysis of laboratory values and/or radiology reports with any subsequent need for medication adjustment of medical intervention for : Neurological problems;Cardiac problems  Antonina Deziel, Lemar Livings 07/10/2012, 9:01 AM

## 2012-07-08 NOTE — Progress Notes (Signed)
ANTICOAGULATION CONSULT NOTE - Follow Up Consult  Pharmacy Consult for Coumadin Indication: LV thrombus  No Known Allergies  Patient Measurements: Height: 5\' 6"  (167.6 cm) Weight: 181 lb 7 oz (82.3 kg) IBW/kg (Calculated) : 59.3   Vital Signs: Temp: 98.2 F (36.8 C) (11/06 1100) Temp src: Oral (11/06 1100) BP: 110/72 mmHg (11/06 1100) Pulse Rate: 87  (11/06 1100)  Labs:  Basename 07/08/12 0630 07/07/12 0720 07/06/12 0730  HGB -- -- --  HCT -- -- --  PLT -- -- --  APTT -- -- --  LABPROT 28.1* 25.6* 24.4*  INR 2.80* 2.47* 2.32*  HEPARINUNFRC -- -- --  CREATININE -- -- --  CKTOTAL -- -- --  CKMB -- -- --  TROPONINI -- -- --    Estimated Creatinine Clearance: 95 ml/min (by C-G formula based on Cr of 0.45).  Assessment: 45yof discharged from [facility] on 10/18 on coumadin 5mg  daily and lovenox 80mg  q12 for new LV thrombus. INR 2.15 at time of discharge after only 2 doses of coumadin. Returns to the hospital that same night with difficulty speaking and L sided weakness. 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   Coumadin continued.  INR today has increased to 2.80. Lower goal desired due to recent bleed.  Goal of Therapy:  INR 2-2.5 (lower goal secondary to hemorrhagic transformation of CVA)   Plan:   Coumadin 1mg  today Continue daily PT/INR checks  Boss Danielsen, Pharm.D. Clinical Pharmacist  Phone [phone] Pager [phone] 07/08/2012, 11:06 AM

## 2012-07-08 NOTE — Progress Notes (Signed)
The skilled treatment note has been reviewed and SLP is in agreement. Baylen Dea, M.A., CCC-SLP [phone]  

## 2012-07-08 NOTE — Progress Notes (Signed)
Advanced Heart Failure Rounding Note   Subjective:    46 yo woman with history of DM,  HTN, NICM,  EF <20%.  LV thrombus, and Grade 3 diastolic dysfunction.  Mod TR.   Discharged from [facility] 06/19/12 after being treated for acute systolic heart failure and LV thrombus. Discharged on loveneox and coumadin.   Presented to ED 06/20/12 with difficulty speaking and L sided weakness. CT of head negative. 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 Working with rehab. LLE strength and expressive aphasia improving some.   Last week Spironolactone 12.5 mg added. Most recent renal function has remained stable. Weight up 10 pounds over the last 2 days. INR 2.8.  07/07/12 ECHO EF 20-25% grade II diastolic dysfunction   Denies SOB/CP.   Objective:     Vital Signs:   Temp:  [97.7 F (36.5 C)-98.7 F (37.1 C)] 98.2 F (36.8 C) (11/06 1100) Pulse Rate:  [79-100] 87  (11/06 1100) Resp:  [17-20] 18  (11/06 1100) BP: (104-122)/(64-82) 110/72 mmHg (11/06 1100) SpO2:  [96 %-100 %] 99 % (11/06 1100) Weight:  [82.3 kg (181 lb 7 oz)] 82.3 kg (181 lb 7 oz) (11/06 0500) Last BM Date: 07/06/12  Weight change: Filed Weights   07/06/12 0500 07/07/12 0500 07/08/12 0500  Weight: 77.8 kg (171 lb 8.3 oz) 82.6 kg (182 lb 1.6 oz) 82.3 kg (181 lb 7 oz)    Intake/Output:   Intake/Output Summary (Last 24 hours) at 07/08/12 1215 Last data filed at 07/08/12 0200  Gross per 24 hour  Intake    720 ml  Output      6 ml  Net    714 ml     Physical Exam:            General:  Well appearing. No resp difficulty sitting in WC HEENT: L facial droop Neck: supple. JVP 8-9.   Carotids 2+ bilat; no bruits. No lymphadenopathy or thryomegaly appreciated. Cor: PMI nondisplaced. Regular.  No gallops, rubs, or murmurs. Lungs: clear Abdomen: soft, nontender, nondistended. No  hepatosplenomegaly. No bruits or masses. Good bowel sounds. Extremities: no cyanosis, clubbing, rash, edema L arm flaccid Neuro: still with expressive aphasia. Writing things down. Weak on L    Labs: Basic Metabolic Panel:  Lab 07/03/12 1610  NA 132*  K 4.0  CL 94*  CO2 24  GLUCOSE 195*  BUN 17  CREATININE 0.45*  CALCIUM 9.9  MG --  PHOS --    Liver Function Tests: No results found for this basename: AST:5,ALT:5,ALKPHOS:5,BILITOT:5,PROT:5,ALBUMIN:5 in the last 168 hours No results found for this basename: LIPASE:5,AMYLASE:5 in the last 168 hours No results found for this basename: AMMONIA:3 in the last 168 hours  CBC: No results found for this basename: WBC:5,NEUTROABS:5,HGB:5,HCT:5,MCV:5,PLT:5 in the last 168 hours  Cardiac Enzymes: No results found for this basename: CKTOTAL:5,CKMB:5,CKMBINDEX:5,TROPONINI:5 in the last 168 hours  BNP: BNP (last 3 results)  Basename 06/17/12 0455 06/15/12 1720  PROBNP 1930.0* 6932.0*       Imaging: No results found.   Medications:     Scheduled Medications:    . carvedilol  3.125 mg Oral BID WC  . digoxin  0.125 mg Oral Daily  . gabapentin  300 mg Oral TID  . influenza  inactive virus vaccine  0.5 mL Intramuscular Tomorrow-1000  . insulin aspart  0-15 Units Subcutaneous TID WC  . lisinopril  2.5 mg Oral BID  . metFORMIN  1,000 mg Oral BID WC  . pantoprazole  40 mg Oral QHS  . spironolactone  12.5 mg Oral Daily  . tiZANidine  2 mg Oral TID  . warfarin  1 mg Oral ONCE-1800  . Warfarin - Pharmacist Dosing Inpatient   Does not apply q1800  . [DISCONTINUED] gabapentin  200 mg Oral TID  . [DISCONTINUED] warfarin  4 mg Oral q1800    Infusions:    PRN Medications: acetaminophen, albuterol, Muscle Rub, ondansetron (ZOFRAN) IV, ondansetron, polyethylene glycol, sorbitol   Assessment:   1. CVA, acute hemorrhagic R MCA    --c/b expressive aphasia and L-sided hemiparesis 2. Mixed systolic and diastolic heart failure  - compensated 3. NICM, EF <20% 4.  LV thrombus  5. DM   6. Hypertension  Plan/Discussion:   Volume mildly elevated. Weight up at  Least 10 pounds. Add lasix 40 mg daily. Check BMET tomorrow. EF remains reduced 20-25%. Need to follow weights and I/Os closely.    Once discharged she will need follow in HF clinic within one week of discharge.   Appreciate Rehab team's work.  CLEGG,AMY,NP-C 12:15 PM  Patient seen and examined with Tonye Becket, NP. We discussed all aspects of the encounter. I agree with the assessment and plan as stated above.   Weight up but volume status doesn't look to bad on exam. No dyspnea. Echo reviewed. EF remains 20-25%. LV thrombus no longer visualized. Agree with restarting lasix. Check BMET on Friday.  Daniel Bensimhon,MD 3:52 PM

## 2012-07-08 NOTE — Progress Notes (Signed)
Pt fell around 0040 today while attempting a stand pivot transfer from bed to wheelchair.  RN was assisting. Pt wanted to go to bathroom. Pt is s/p rt CVA with left sided weakness.   Post fall,pt denied any new pain;no injuries were visible and no tender areas were palpated anywhere on pt's body.  Pt did complain of low back pain prior to fall and "nerve pain" in her left leg.  Harvel Ricks PA was notified of pt's fall at 0114 on 07/08/12. Frequent vital signs and neuro checks performed.  No new changes in pt condition.

## 2012-07-08 NOTE — Progress Notes (Signed)
Orthopedic Tech Progress Note Patient Details:  Margaret Hess Jun 28, 1966 [id number]  Ortho Devices Type of Ortho Device: Knee Immobilizer Ortho Device/Splint Location: left leg Ortho Device/Splint Interventions: Application   Margaret Hess 07/08/2012, 2:44 PM

## 2012-07-08 NOTE — Progress Notes (Signed)
Recreational Therapy Session Note  Patient Details  Name: Jasleene Janson MRN: [id number] Date of Birth: [birth year] Today's Date: 07/08/2012 Time:  1300-1335 Pain: no c/o Skilled Therapeutic Interventions/Progress Updates: Pt performed bed mobility with supervision.  Pt transferred bed-->w/c squat pivot with min assist.  Pt participated in leisure discussion/verbal expression with min-mod cues.    Therapy/Group: Individual Therapy  Jason Hauge 07/08/2012, 5:19 PM

## 2012-07-08 NOTE — Progress Notes (Signed)
Physical Therapy Session Note  Patient Details  Name: Margaret Hess MRN: [id number] Date of Birth: 05-Jul-1966  Today's Date: 07/08/2012 Time:0950-1030 and 1130-1200 Time Calculation (min): 40 min and 30 min  Short Term Goals: Week 2:  PT Short Term Goal 1 (Week 2): Pt will perform rolling R after  2 VCs with supervision. PT Short Term Goal 2 (Week 2): Pt will perform basic transfer with min assist. PT Short Term Goal 3 (Week 2): Pt will stand during functional activity x 10 minutes with mod assist for balance. PT Short Term Goal 4 (Week 2): Pt will perform gait x 10' with LRAD, assist of 1 person. PT Short Term Goal 5 (Week 2): Pt will propel w/c 150' with supervision, with 0-1 Vc for route finding.  Skilled Therapeutic Interventions/Progress Updates:  1st tx neuromuscular re-education in supine via forced use, manual and VCs, visual feedback, for: -bed mobility supine on mat, with active assist for LLE: bil bridging ; unilateral L bridging , bil hip abduction/adduction (small excursions)and lower trunk rotation; rolling R with hemi technique  -LLE hip extension in R sidelying using powder board for ant-gravity movement, mirror for feedback.  Pt unable to elicit any hip extension.  She communicated that she cannot feel at all what LLE is doing. -transfer via squat pivot with LLE bias for more = wt bearing -high mat> stand working on LLE extension  2nd tx neuromuscular re-education via forced use, VCs, tactile and manual cues for: -gait with Carley Hammed walker, +2 assist to control walker; pt >50%, wearing L AFO and L knee hyperext brace, strap for L hand x 26'.  Focus on trunk and LLE extension, L foot placement, wider BOS, slow cadence for improved control.     Therapy Documentation Precautions:  Precautions Precautions: Fall Precaution Comments: aphasic; LLE  flexor withdrawal at times Restrictions Weight Bearing Restrictions: No   Pain: Pain Assessment Pain Score: 1/10     Locomotion : Ambulation Ambulation/Gait Assistance: 1: +2 Total assist     See FIM for current functional status  Therapy/Group: Individual Therapy  Korrina Zern 07/08/2012, 12:57 PM

## 2012-07-08 NOTE — Progress Notes (Signed)
Occupational Therapy Session Note  Patient Details  Name: Margaret Hess MRN: [id number] Date of Birth: 02/20/1966  Today's Date: 07/08/2012 Time: 0802-0945 Time Calculation (min): 103 min  Short Term Goals: Week 1:  OT Short Term Goal 1 (Week 1): Pt will maintain dynamic sitting balance with mod assist during selfcare activities. OT Short Term Goal 1 - Progress (Week 1): Met OT Short Term Goal 2 (Week 1): Pt will position the LUE prior and after transitional movements with mod questioning cues. OT Short Term Goal 2 - Progress (Week 1): Met OT Short Term Goal 3 (Week 1): Pt will perform all bathing with mod assist sit to stand. OT Short Term Goal 3 - Progress (Week 1): Met OT Short Term Goal 4 (Week 1): Pt will perform UB dressing with min assist to donn pullover shirt. OT Short Term Goal 4 - Progress (Week 1): Met OT Short Term Goal 5 (Week 1): Pt will perform toilet transfers with mod assist to 3:1. Week 2:  OT Short Term Goal 1 (Week 2): Pt will be able to stand at sink with min assist to allow her to pull up her pants with min assist. OT Short Term Goal 2 (Week 2): Pt will bathe in shower with steady assist using long sponge. OT Short Term Goal 3 (Week 2): Pt will perform self ROM to LUE with min verbal cues. OT Short Term Goal 4 (Week 2): Pt will transfer to toilet with min assist.  Skilled Therapeutic Interventions/Progress Updates:      Pt seen for BADL retraining of toileting, bathing, and dressing with a focus on sit to stand, standing balance and LUE management with UB dressing. Pt was able to don a tank top with cues only demonstrating improved management of her LUE.  Pt stood several times for several minutes at sink while working on LB ADLs.  She needs facilitation through LLE to maintain extension in left leg.   Pt was then seen for LUE neuro re-ed in gym with a focus on trunk rotation to decrease tone, Prom exercises, weight bearing, and arom facilitation.  Pt has increasing  tone with movement or when she yawns in flexion, but does not have any active movement.  Pt states that she can not feel her arm move when the therapist passively moves it. She also c/o neuropathic pain intermittently.  Therapy Documentation Precautions:  Precautions Precautions: Fall Precaution Comments: aphasic Restrictions Weight Bearing Restrictions: No   Pain: Pain Assessment Pain Assessment: No/denies pain ADL:  See FIM for current functional status  Therapy/Group: Individual Therapy  Freida Nebel 07/08/2012, 10:21 AM

## 2012-07-08 NOTE — Progress Notes (Signed)
Speech Language Pathology Daily Session Note  Patient Details  Name: Jaeden Howser MRN: [id number] Date of Birth: 06-Jun-1966  Today's Date: 07/08/2012 Time: 4098-1191 Time Calculation (min): 60 min  Short Term Goals: Week 3: SLP Short Term Goal 1 (Week 3): Patient will verbally complete automatic sequences with supervision level assist phonemic cues. SLP Short Term Goal 2 (Week 3): Patient will verbally produce phrase level (3-4 word) utterances to describe situations, pictures, etc. with min assist verbal and written cues.  SLP Short Term Goal 3 (Week 3): Patient will self monitor accuracy of verbal expression with min assist verbal and non-verbal cues. SLP Short Term Goal 4 (Week 3): Patient will write by copying basic, biographical information with min assist multi-modal cues to self monitor and correct errors. SLP Short Term Goal 5 (Week 3): During non-structed verbal expression tasks patient with produce grammaticcaly correct phrases with max assist multi-modal cues.  Skilled Therapeutic Interventions: Treatment session addressed phrase level verbal expression to include articles, prepositions, pronouns and helping verbs with written template, visual and verbal cues with min assist cues to identify and self-correct errors.  Patient copied at the letter level with supervision cues to self-monitor and correct errors during written expression.  Patient also wrote first name and 50% of last name with the use of external aid (e.g. list of uppercase and lower case letters which clinician used with min assist cues to complete last name).  Overall, patient exhibited increased length of utterance during unstructured conversation; however, patient still exhibits incomplete phrases with inconsistent use of grammatical functors.    FIM:  Comprehension Comprehension Mode: Auditory Comprehension: 5-Follows basic conversation/direction: With extra time/assistive device Expression Expression Mode:  Verbal Expression: 3-Expresses basic 50 - 74% of the time/requires cueing 25 - 50% of the time. Needs to repeat parts of sentences. Social Interaction Social Interaction: 6-Interacts appropriately with others with medication or extra time (anti-anxiety, antidepressant). Problem Solving Problem Solving: 5-Solves basic 90% of the time/requires cueing < 10% of the time Memory Memory: 5-Recognizes or recalls 90% of the time/requires cueing < 10% of the time  Pain Pain Assessment Pain Assessment: No/denies pain Pain Score: 0-No pain Faces Pain Scale: No hurt  Therapy/Group: Individual Therapy  Jackalyn Lombard, Conrad Brazos  Graduate Clinician Speech Language Pathology   Satoya Feeley, Joni Reining 07/08/2012, 3:05 PM

## 2012-07-09 ENCOUNTER — Inpatient Hospital Stay (HOSPITAL_COMMUNITY): Payer: BC Managed Care – PPO | Admitting: Speech Pathology

## 2012-07-09 ENCOUNTER — Inpatient Hospital Stay (HOSPITAL_COMMUNITY): Payer: BC Managed Care – PPO | Admitting: Occupational Therapy

## 2012-07-09 ENCOUNTER — Inpatient Hospital Stay (HOSPITAL_COMMUNITY): Payer: BC Managed Care – PPO | Admitting: *Deleted

## 2012-07-09 ENCOUNTER — Inpatient Hospital Stay (HOSPITAL_COMMUNITY): Payer: BC Managed Care – PPO

## 2012-07-09 DIAGNOSIS — I69991 Dysphagia following unspecified cerebrovascular disease: Secondary | ICD-10-CM

## 2012-07-09 DIAGNOSIS — E119 Type 2 diabetes mellitus without complications: Secondary | ICD-10-CM

## 2012-07-09 DIAGNOSIS — G8110 Spastic hemiplegia affecting unspecified side: Secondary | ICD-10-CM

## 2012-07-09 LAB — GLUCOSE, CAPILLARY
Glucose-Capillary: 142 mg/dL — ABNORMAL HIGH (ref 70–99)
Glucose-Capillary: 97 mg/dL (ref 70–99)

## 2012-07-09 LAB — PROTIME-INR
INR: 2.3 — ABNORMAL HIGH (ref 0.00–1.49)
Prothrombin Time: 24.3 seconds — ABNORMAL HIGH (ref 11.6–15.2)

## 2012-07-09 MED ORDER — WARFARIN SODIUM 3 MG PO TABS
3.0000 mg | ORAL_TABLET | Freq: Once | ORAL | Status: AC
  Administered 2012-07-09: 3 mg via ORAL
  Filled 2012-07-09: qty 1

## 2012-07-09 NOTE — Progress Notes (Signed)
Social Work Patient ID: Margaret Hess, female   DOB: 1966-06-10, 46 y.o.   MRN: [id number] Met with pt to inform of team conference progression toward goals and plan.  She reports: " It scares me the thought of going home." She doesn't not want to burden her family and wants to be able to do much for herself.  She reports she has had been down this week but then Yesterday she could write her name and this was a hope sign for her.  She reports she is trying to focus on the small progress each day and not think about the huge progress she wants to make.  She is thankful for many things.  Will continue to provide support and assistance with the discharge plan.

## 2012-07-09 NOTE — Progress Notes (Signed)
Occupational Therapy Session Note  Patient Details  Name: Margaret Hess MRN: [id number] Date of Birth: 1966/04/07  Today's Date: 07/09/2012 Time:  [id number] and 4098-1191 Time Calculation (min): 60 min and 40 min  Short Term Goals: Week 1:  OT Short Term Goal 1 (Week 1): Pt will maintain dynamic sitting balance with mod assist during selfcare activities. OT Short Term Goal 1 - Progress (Week 1): Met OT Short Term Goal 2 (Week 1): Pt will position the LUE prior and after transitional movements with mod questioning cues. OT Short Term Goal 2 - Progress (Week 1): Met OT Short Term Goal 3 (Week 1): Pt will perform all bathing with mod assist sit to stand. OT Short Term Goal 3 - Progress (Week 1): Met OT Short Term Goal 4 (Week 1): Pt will perform UB dressing with min assist to donn pullover shirt. OT Short Term Goal 4 - Progress (Week 1): Met OT Short Term Goal 5 (Week 1): Pt will perform toilet transfers with mod assist to 3:1. Week 2:  OT Short Term Goal 1 (Week 2): Pt will be able to stand at sink with min assist to allow her to pull up her pants with min assist. OT Short Term Goal 2 (Week 2): Pt will bathe in shower with steady assist using long sponge. OT Short Term Goal 3 (Week 2): Pt will perform self ROM to LUE with min verbal cues. OT Short Term Goal 4 (Week 2): Pt will transfer to toilet with min assist.  Skilled Therapeutic Interventions/Progress Updates:      Visit 1: Pt seen for BADL retraining of toileting, bathing, and dressing with a focus on sit to stand and standing balance with LUE management.  Pt did well maintaining balance with LLE facilitation.  Visit 2:  Pt worked on trunk control and sitting balance with a focus on reaching to left side with LUE weight bearing.  In supine, knee rolling side to side, pelvic tilts, bridging to increase trunk strength.  Lue PROM.  Despite increased tone, pt has full PROM.  Therapy Documentation Precautions:  Precautions Precautions:  Fall Precaution Comments: aphasic; LLE flexor withdrawal at times Restrictions Weight Bearing Restrictions: No Pain: Pain Assessment Pain Assessment: No/denies pain ADL:  See FIM for current functional status  Therapy/Group: Individual Therapy  [name] 07/09/2012, 11:53 AM

## 2012-07-09 NOTE — Progress Notes (Signed)
The skilled treatment note has been reviewed and SLP is in agreement. Jinx Gilden, M.A., CCC-SLP [phone]  

## 2012-07-09 NOTE — Progress Notes (Signed)
Speech Language Pathology Daily Session Note  Patient Details  Name: Margaret Hess MRN: [id number] Date of Birth: 12/13/1965  Today's Date: 07/09/2012 Time: 1405-1505 Time Calculation (min): 60 min  Short Term Goals: Week 3: SLP Short Term Goal 1 (Week 3): Patient will verbally complete automatic sequences with supervision level assist phonemic cues. SLP Short Term Goal 2 (Week 3): Patient will verbally produce phrase level (3-4 word) utterances to describe situations, pictures, etc. with min assist verbal and written cues.  SLP Short Term Goal 3 (Week 3): Patient will self monitor accuracy of verbal expression with min assist verbal and non-verbal cues. SLP Short Term Goal 4 (Week 3): Patient will write by copying basic, biographical information with min assist multi-modal cues to self monitor and correct errors. SLP Short Term Goal 5 (Week 3): During non-structed verbal expression tasks patient with produce grammaticcaly correct phrases with max assist multi-modal cues.  Skilled Therapeutic Interventions: Treatment session addressed phrase level verbal expression to include articles, prepositions, pronouns and helping verbs with written template, visual and verbal cues with min assist faded to supervision cues to identify and self-correct errors.  SLP also facilitated the session with written fill in the blank sentences which patient verbally completed with mod faded to min assist visual and verbal cues.  Patient copied at the letter level with supervision cues, and at the word level for first and last name with mod faded to min assist to self-monitor and correct errors during written expression.    FIM:  Comprehension Comprehension Mode: Auditory Comprehension: 5-Follows basic conversation/direction: With extra time/assistive device Expression Expression Mode: Verbal Expression: 3-Expresses basic 50 - 74% of the time/requires cueing 25 - 50% of the time. Needs to repeat parts of  sentences. Social Interaction Social Interaction: 6-Interacts appropriately with others with medication or extra time (anti-anxiety, antidepressant). Problem Solving Problem Solving: 5-Solves basic 90% of the time/requires cueing < 10% of the time Memory Memory: 5-Recognizes or recalls 90% of the time/requires cueing < 10% of the time  Pain Pain Assessment Pain Assessment: No/denies pain  Therapy/Group: Individual Therapy  Jackalyn Lombard, Conrad Guilford  Graduate Clinician Speech Language Pathology   Kaysey Berndt, Joni Reining 07/09/2012, 4:40 PM

## 2012-07-09 NOTE — Progress Notes (Signed)
ANTICOAGULATION CONSULT NOTE - Follow Up Consult  Pharmacy Consult for Coumadin Indication: LV thrombus  No Known Allergies  Patient Measurements: Height: 5\' 6"  (167.6 cm) Weight: 172 lb 13.5 oz (78.4 kg) IBW/kg (Calculated) : 59.3   Vital Signs: Temp: 98.3 F (36.8 C) (11/07 0517) Temp src: Oral (11/07 0517) BP: 101/83 mmHg (11/07 0818) Pulse Rate: 100  (11/07 0818)  Labs:  Basename 07/09/12 0700 07/08/12 1532 07/08/12 0630 07/07/12 0720  HGB -- -- -- --  HCT -- -- -- --  PLT -- -- -- --  APTT -- -- -- --  LABPROT 24.3* -- 28.1* 25.6*  INR 2.30* -- 2.80* 2.47*  HEPARINUNFRC -- -- -- --  CREATININE -- 0.59 -- --  CKTOTAL -- -- -- --  CKMB -- -- -- --  TROPONINI -- -- -- --    Estimated Creatinine Clearance: 92.8 ml/min (by C-G formula based on Cr of 0.59).  Assessment: Margaret Hess discharged from [facility] on 10/18 on coumadin 5mg  daily and lovenox 80mg  q12 for new LV thrombus. INR 2.15 at time of discharge after only 2 doses of coumadin. Returns to the hospital that same night with difficulty speaking and L sided weakness. 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   Coumadin continued.  INR today has decreased to 2.3. Lower goal desired due to recent bleed.  Goal of Therapy:  INR 2-2.5 (lower goal secondary to hemorrhagic transformation of CVA)   Plan:  Coumadin 3mg  today Continue daily PT/INR checks  [name], Pharm.D., BCPS Clinical Pharmacist  Phone [phone] Pager [phone] 07/09/2012, 10:11 AM

## 2012-07-09 NOTE — Progress Notes (Signed)
Physical Therapy Weekly Progress Note  Patient Details  Name: Margaret Hess MRN: [id number] Date of Birth: [birth year]  Today's Date: 07/09/2012 Time: 0805-0900 Time Calculation (min): 55 min  Patient has met 3 of 5 short term goals.  Pt is extremely motivated and participates in therapy to the best of her ability, each session.  Standing and gait continue to be particularly challenging because pt has developed flexor synergy/withdrawal but very little extension in hip or knee.  She benefits from [facility], but this was insufficient to control hip and knee.  Knee hyperextension prevention brace was somewhat helpful.  Pt has now received a KI which will be particularly useful for static standing to promote full, prolonged wt bearing LLE.  Pt is also limited by significantly reduced or absent sensation LLE, as well as apraxia at times .  Patient continues to demonstrate the following deficits: strength, motor timing and sequencing, motor planning, balance, apraxia, awareness and therefore will continue to benefit from skilled PT intervention to enhance overall performance with activity tolerance, balance, postural control, ability to compensate for deficits, functional use of  left upper extremity and left lower extremity and awareness.  Patient progressing toward long term goals..  Continue plan of care.  PT Short Term Goals Week 2:  PT Short Term Goal 1 (Week 2): Pt will perform rolling R after  2 VCs with supervision. PT Short Term Goal 1 - Progress (Week 2): Met PT Short Term Goal 2 (Week 2): Pt will perform basic transfer with min assist. PT Short Term Goal 2 - Progress (Week 2): Met PT Short Term Goal 3 (Week 2): Pt will stand during functional activity x 10 minutes with mod assist for balance. PT Short Term Goal 3 - Progress (Week 2): Not met PT Short Term Goal 4 (Week 2): Pt will perform gait x 10' with LRAD, assist of 1 person. PT Short Term Goal 4 - Progress (Week 2): Partly met PT Short  Term Goal 5 (Week 2): Pt will propel w/c 150' with supervision, with 0-1 Vc for route finding. PT Short Term Goal 5 - Progress (Week 2): Met  Skilled Therapeutic Interventions/Progress Updates:   Pt with brighter affect today; she stated that she slept "all night long".  neuromuscular re-education via demo, VCs, forced use, tactile cues, for:  Bed mobility, rolling R in flat bed, with 1 VC for hemi technique; R sidelying> sit with min assist for LLE.  Pt sat bedside, feet supported, back unsupported, x 15 minutes, while self-feeding breakfast.  Pt participated verbally in decision-making for therapist to organize pt's personal items by category, including storage location in room.  Bed> w/c squat pivot transfer, using cues "low and slow" to decrease tendency for LLE flexor withdrawal.  Pt understood command and performed a very safe transfer to L, with min assist.  This technique written up on safety plan, and conveyed to [place], NT today.    W/c> toilet transfer, stand pivot with mod assist to L, using safety rail.  Pt required mod assist for balance during clothing management.  Pt required mod VCs and min tactile cues to attend to L side for clothing.  W/c mobility x  150' with supervision, 1 VC for problem solving for route finding.  Neuro re-ed as above, for:  Gait with EVA walker, wearing L AFO and L KI, L hand secured to handgrip, x 30' , +2 , one person to slow down and steer the EVA, and one for neuromuscular re-education during gait.  Focus on L hip extension, L pelvic activation, = wt bearing, wt shift> R before attempting to step with RLE.    Therapy Documentation Precautions:  Precautions Precautions: Fall Precaution Comments: aphasic; LLE flexor withdrawal at times Restrictions Weight Bearing Restrictions: No       See FIM for current functional status  Therapy/Group: Individual Therapy  Italia Wolfert 07/09/2012, 2:29 PM

## 2012-07-09 NOTE — Progress Notes (Signed)
Recreational Therapy Session Note  Patient Details  Name: Margaret Hess MRN: [id number] Date of Birth: Mar 12, 1966 Today's Date: 07/09/2012 Time:  1-145 Pain: no c/o Skilled Therapeutic Interventions/Progress Updates: Pt enjoys cooking and expressed desire to participate in cooking task.  Went to kitchen for meal planning, making a grocery list, and determining which items needed to be purchased seated w/c level.  Pt able to verbalize a list of ingredients needed for recipe with  Mod I, self correcting errors.    Therapy/Group: Individual Therapy  Tahirah Sara 07/09/2012, 4:04 PM

## 2012-07-09 NOTE — Progress Notes (Signed)
Patient ID: Margaret Hess, female   DOB: 1965-09-16, 46 y.o.   MRN: [id number]  Subjective/Complaints: Nerve pain L side , spasms Reviewed BPs Review of Systems  Unable to perform ROS: medical condition   Objective: Vital Signs: Blood pressure 101/83, pulse 100, temperature 98.3 F (36.8 C), temperature source Oral, resp. rate 18, height 5\' 6"  (1.676 m), weight 78.4 kg (172 lb 13.5 oz), SpO2 98.00%. No results found. Results for orders placed during the hospital encounter of 06/25/12 (from the past 72 hour(s))  GLUCOSE, CAPILLARY     Status: Abnormal   Collection Time   07/06/12 11:51 AM      Component Value Range Comment   Glucose-Capillary 134 (*) 70 - 99 mg/dL    Comment 1 Notify RN     GLUCOSE, CAPILLARY     Status: Abnormal   Collection Time   07/06/12  4:29 PM      Component Value Range Comment   Glucose-Capillary 152 (*) 70 - 99 mg/dL   GLUCOSE, CAPILLARY     Status: Abnormal   Collection Time   07/06/12  8:54 PM      Component Value Range Comment   Glucose-Capillary 132 (*) 70 - 99 mg/dL   PROTIME-INR     Status: Abnormal   Collection Time   07/07/12  7:20 AM      Component Value Range Comment   Prothrombin Time 25.6 (*) 11.6 - 15.2 seconds    INR 2.47 (*) 0.00 - 1.49   GLUCOSE, CAPILLARY     Status: Abnormal   Collection Time   07/07/12  7:23 AM      Component Value Range Comment   Glucose-Capillary 140 (*) 70 - 99 mg/dL    Comment 1 Notify RN     GLUCOSE, CAPILLARY     Status: Abnormal   Collection Time   07/07/12 11:15 AM      Component Value Range Comment   Glucose-Capillary 142 (*) 70 - 99 mg/dL    Comment 1 Notify RN     GLUCOSE, CAPILLARY     Status: Abnormal   Collection Time   07/07/12  4:50 PM      Component Value Range Comment   Glucose-Capillary 112 (*) 70 - 99 mg/dL   GLUCOSE, CAPILLARY     Status: Abnormal   Collection Time   07/07/12  9:10 PM      Component Value Range Comment   Glucose-Capillary 130 (*) 70 - 99 mg/dL   PROTIME-INR     Status:  Abnormal   Collection Time   07/08/12  6:30 AM      Component Value Range Comment   Prothrombin Time 28.1 (*) 11.6 - 15.2 seconds    INR 2.80 (*) 0.00 - 1.49   GLUCOSE, CAPILLARY     Status: Abnormal   Collection Time   07/08/12  7:48 AM      Component Value Range Comment   Glucose-Capillary 143 (*) 70 - 99 mg/dL    Comment 1 Notify RN     GLUCOSE, CAPILLARY     Status: Abnormal   Collection Time   07/08/12 11:07 AM      Component Value Range Comment   Glucose-Capillary 150 (*) 70 - 99 mg/dL    Comment 1 Notify RN     BASIC METABOLIC PANEL     Status: Abnormal   Collection Time   07/08/12  3:32 PM      Component Value Range Comment   Sodium 138  135 - 145 mEq/L    Potassium 4.8  3.5 - 5.1 mEq/L    Chloride 100  96 - 112 mEq/L    CO2 25  19 - 32 mEq/L    Glucose, Bld 149 (*) 70 - 99 mg/dL    BUN 18  6 - 23 mg/dL    Creatinine, Ser 2.13  0.50 - 1.10 mg/dL    Calcium 08.6  8.4 - 10.5 mg/dL    GFR calc non Af Amer >90  >90 mL/min    GFR calc Af Amer >90  >90 mL/min   GLUCOSE, CAPILLARY     Status: Abnormal   Collection Time   07/08/12  4:51 PM      Component Value Range Comment   Glucose-Capillary 154 (*) 70 - 99 mg/dL   GLUCOSE, CAPILLARY     Status: Abnormal   Collection Time   07/08/12  9:04 PM      Component Value Range Comment   Glucose-Capillary 154 (*) 70 - 99 mg/dL    Comment 1 Notify RN     PROTIME-INR     Status: Abnormal   Collection Time   07/09/12  7:00 AM      Component Value Range Comment   Prothrombin Time 24.3 (*) 11.6 - 15.2 seconds    INR 2.30 (*) 0.00 - 1.49   GLUCOSE, CAPILLARY     Status: Abnormal   Collection Time   07/09/12  7:24 AM      Component Value Range Comment   Glucose-Capillary 142 (*) 70 - 99 mg/dL    Comment 1 Notify RN        HEENT: normal Cardio: RRR Resp: CTA B/L GI: BS positive Extremity:  No Edema Skin:   Intact Neuro: Abnormal Sensory absent light touch and proprio, positive deep pain, Abnormal Motor 0/5 in LUE and LLE,  Abnormal FMC Tone  reduced on L side, Tone:  Hypotonia, Aphasic, Inattention and Apraxic Musc/Skel:  Swelling L dorsum hand ecchymosis and swelling-mild Increased L hamstrings and hip adductor tone--unchanged  Assessment/Plan: 1. Functional deficits secondary to R MCA infarct L HP, crossed aphasia,apraxia  which require 3+ hours per day of interdisciplinary therapy in a comprehensive inpatient rehab setting. Physiatrist is providing close team supervision and 24 hour management of active medical problems listed below. Physiatrist and rehab team continue to assess barriers to discharge/monitor patient progress toward functional and medical goals. FIM: FIM - Bathing Bathing Steps Patient Completed: Chest;Right Arm;Left Arm;Abdomen;Front perineal area;Right upper leg;Left upper leg;Right lower leg (including foot);Left lower leg (including foot) Bathing: 4: Min-Patient completes 8-9 71f 10 parts or 75+ percent  FIM - Upper Body Dressing/Undressing Upper body dressing/undressing steps patient completed: Thread/unthread right sleeve of pullover shirt/dresss;Thread/unthread left sleeve of pullover shirt/dress;Put head through opening of pull over shirt/dress;Pull shirt over trunk Upper body dressing/undressing: 5: Supervision: Safety issues/verbal cues FIM - Lower Body Dressing/Undressing Lower body dressing/undressing steps patient completed: Thread/unthread right underwear leg;Thread/unthread left underwear leg;Pull underwear up/down;Thread/unthread right pants leg;Thread/unthread left pants leg;Pull pants up/down;Don/Doff right shoe Lower body dressing/undressing: 3: Mod-Patient completed 50-74% of tasks  FIM - Toileting Toileting steps completed by patient: Performs perineal hygiene Toileting Assistive Devices: Grab bar or rail for support Toileting: 2: Max-Patient completed 1 of 3 steps  FIM - Diplomatic Services operational officer Devices: Grab bars Toilet Transfers: 4-To toilet/BSC:  Min A (steadying Pt. > 75%);4-From toilet/BSC: Min A (steadying Pt. > 75%)  FIM - Bed/Chair Transfer Bed/Chair Transfer Assistive Devices: Arm rests Bed/Chair  Transfer: 3: Bed > Chair or W/C: Mod A (lift or lower assist)  FIM - Locomotion: Wheelchair Locomotion: Wheelchair: 1: Total Assistance/staff pushes wheelchair (Pt<25%) FIM - Locomotion: Ambulation Locomotion: Ambulation Assistive Devices: Walker - Eva;Orthosis Ambulation/Gait Assistance: 1: +2 Total assist Locomotion: Ambulation: 1: Two helpers  Comprehension Comprehension Mode: Auditory Comprehension: 5-Follows basic conversation/direction: With extra time/assistive device  Expression Expression Mode: Verbal Expression: 3-Expresses basic 50 - 74% of the time/requires cueing 25 - 50% of the time. Needs to repeat parts of sentences.  Social Interaction Social Interaction Mode: Asleep Social Interaction: 6-Interacts appropriately with others with medication or extra time (anti-anxiety, antidepressant).  Problem Solving Problem Solving Mode: Asleep Problem Solving: 5-Solves basic 90% of the time/requires cueing < 10% of the time  Memory Memory Mode: Asleep Memory: 5-Recognizes or recalls 90% of the time/requires cueing < 10% of the time  Medical Problem List and Plan:  1. 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 DVT Prophylaxis/Anticoagulation: Chronic Coumadin therapy with a goal INR of 2-2.5 secondary hemorrhagic transformation  3. Neuropsych: This patient is not capable of making decisions on his/her own behalf.  4. Hypertension/nonischemic cardiomyopathy with grade 3 diastolic dysfunction.Reduce  Lisinopril to  2.5 mg twice a day, Lanoxin 0.125 mg daily. Monitor with increased mobility. Cardiology to follow up as needed(Dr. Bensimhon), TED hose when OOB  5. Diabetes mellitus. Patient currently on sliding scale insulin. Patient on Glucophage 500 mg twice a day prior  to admission and victoza 1.2 mL subcutaneously daily. Check blood sugars a.c. and at bedtime, Is on hi dose metformin 1000mg  BID with controlled CBGs.  If this fails then restart Victoza--improved control at present 6. Mood/depression. Patient on Wellbutrin prior to admission 75 mg daily. We'll discuss resuming this medication. Provide emotional support and positive reinforcement  7. Sensory dysesthesia related to parietal infarct and hemorrhagic transformation, increased gabapentin- appears ineffective at current dose, increase to 300mg  8.  Back pain mild/mod will use K pad and analgesic balm  -encouraged oob, lumbar roll as well 9.  Spasticity  started Zanaflex monitor sedation, ok thus far  LOS (Days) 14 A FACE TO FACE EVALUATION WAS PERFORMED  Hannah Crill E 07/09/2012, 8:55 AM

## 2012-07-10 ENCOUNTER — Inpatient Hospital Stay (HOSPITAL_COMMUNITY): Payer: BC Managed Care – PPO | Admitting: Speech Pathology

## 2012-07-10 ENCOUNTER — Encounter (HOSPITAL_COMMUNITY): Payer: BC Managed Care – PPO

## 2012-07-10 ENCOUNTER — Inpatient Hospital Stay (HOSPITAL_COMMUNITY): Payer: BC Managed Care – PPO | Admitting: Occupational Therapy

## 2012-07-10 LAB — BASIC METABOLIC PANEL
CO2: 26 mEq/L (ref 19–32)
Chloride: 96 mEq/L (ref 96–112)
Sodium: 135 mEq/L (ref 135–145)

## 2012-07-10 LAB — PROTIME-INR: INR: 2.03 — ABNORMAL HIGH (ref 0.00–1.49)

## 2012-07-10 LAB — GLUCOSE, CAPILLARY
Glucose-Capillary: 136 mg/dL — ABNORMAL HIGH (ref 70–99)
Glucose-Capillary: 182 mg/dL — ABNORMAL HIGH (ref 70–99)

## 2012-07-10 MED ORDER — GABAPENTIN 400 MG PO CAPS
400.0000 mg | ORAL_CAPSULE | Freq: Three times a day (TID) | ORAL | Status: DC
  Administered 2012-07-10 – 2012-07-13 (×9): 400 mg via ORAL
  Filled 2012-07-10 (×12): qty 1

## 2012-07-10 MED ORDER — WARFARIN SODIUM 3 MG PO TABS
3.0000 mg | ORAL_TABLET | Freq: Once | ORAL | Status: AC
  Administered 2012-07-10: 3 mg via ORAL
  Filled 2012-07-10: qty 1

## 2012-07-10 NOTE — Progress Notes (Signed)
Occupational Therapy Session Note  Patient Details  Name: Margaret Hess MRN: [id number] Date of Birth: 1965/10/02  Today's Date: 07/10/2012 Time: 0805-0900 and 1100-1130 Time Calculation (min): 55 min and 30 min  Short Term Goals: Week 1:  OT Short Term Goal 1 (Week 1): Pt will maintain dynamic sitting balance with mod assist during selfcare activities. OT Short Term Goal 1 - Progress (Week 1): Met OT Short Term Goal 2 (Week 1): Pt will position the LUE prior and after transitional movements with mod questioning cues. OT Short Term Goal 2 - Progress (Week 1): Met OT Short Term Goal 3 (Week 1): Pt will perform all bathing with mod assist sit to stand. OT Short Term Goal 3 - Progress (Week 1): Met OT Short Term Goal 4 (Week 1): Pt will perform UB dressing with min assist to donn pullover shirt. OT Short Term Goal 4 - Progress (Week 1): Met OT Short Term Goal 5 (Week 1): Pt will perform toilet transfers with mod assist to 3:1. Week 2:  OT Short Term Goal 1 (Week 2): Pt will be able to stand at sink with min assist to allow her to pull up her pants with min assist. OT Short Term Goal 2 (Week 2): Pt will bathe in shower with steady assist using long sponge. OT Short Term Goal 3 (Week 2): Pt will perform self ROM to LUE with min verbal cues. OT Short Term Goal 4 (Week 2): Pt will transfer to toilet with min assist.  Skilled Therapeutic Interventions/Progress Updates:    Visit 1:   Pt seen for BADL retraining of toileting, bathing, and dressing with a focus on standing balance and LLE control with assisted knee extension and hemi dressing techniques.  Pt was able to don clothing with less cuing today and was able to tolerate standing for longer periods of time. She has flexor tone in LUE but her arm is able to relax and achieve full PROM with massage and stretching.  Visit 2:  LUE neuro re-ed with PNF patterns, tapping, PROM, mirror therapy. No change today, pt continues to have no active  movement in arm. Unfortunatly, patient is not able to feel anything with her left arm or feel her arm being moved.  She does have significant neuropathic pain.  Therapy Documentation Precautions:  Precautions Precautions: Fall Precaution Comments: aphasic; LLE flexor withdrawal at times Restrictions Weight Bearing Restrictions: No   Vital Signs: Therapy Vitals Pulse Rate: 99  BP: 115/74 mmHg Pain: Pain Assessment Pain Assessment: 0-10 Pain Score:   6 Pain Type: Acute pain Pain Location: Back Pain Orientation: Lower Pain Descriptors: Aching Pain Frequency: Intermittent Pain Onset: Gradual Patients Stated Pain Goal: 2 Pain Intervention(s): Medication (See eMAR) ADL:  See FIM for current functional status  Therapy/Group: Individual Therapy  SAGUIER,JULIA 07/10/2012, 9:31 AM

## 2012-07-10 NOTE — Progress Notes (Signed)
ANTICOAGULATION CONSULT NOTE - Follow Up Consult  Pharmacy Consult for Coumadin Indication: LV thrombus  No Known Allergies  Patient Measurements: Height: 5\' 6"  (167.6 cm) Weight: 177 lb 7.5 oz (80.5 kg) IBW/kg (Calculated) : 59.3   Vital Signs: Temp: 98.3 F (36.8 C) (11/08 0500) Temp src: Oral (11/08 0500) BP: 115/74 mmHg (11/08 0833) Pulse Rate: 99  (11/08 0833)  Labs:  Basename 07/10/12 0520 07/09/12 0700 07/08/12 1532 07/08/12 0630  HGB -- -- -- --  HCT -- -- -- --  PLT -- -- -- --  APTT -- -- -- --  LABPROT 22.1* 24.3* -- 28.1*  INR 2.03* 2.30* -- 2.80*  HEPARINUNFRC -- -- -- --  CREATININE 0.75 -- 0.59 --  CKTOTAL -- -- -- --  CKMB -- -- -- --  TROPONINI -- -- -- --    Estimated Creatinine Clearance: 94 ml/min (by C-G formula based on Cr of 0.75).  Assessment: 45yof discharged from [facility] on 10/18 on coumadin 5mg  daily and lovenox 80mg  q12 for new LV thrombus. INR 2.15 at time of discharge after only 2 doses of coumadin. Returns to the hospital that same night with difficulty speaking and L sided weakness. 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   Coumadin continued.  INR today has decreased to 2.03, within therapeutic range. Lower goal desired due to recent bleed.  Dose was adjusted 11/7.  Goal of Therapy:  INR 2-2.5 (lower goal secondary to hemorrhagic transformation of CVA)   Plan:  Coumadin 3mg  today Continue daily PT/INR checks  [name], Pharm.D., BCPS Clinical Pharmacist  Phone [phone] Pager [phone] 07/10/2012, 10:07 AM

## 2012-07-10 NOTE — Progress Notes (Signed)
Occupational Therapy Weekly Progress Note  Patient Details  Name: Margaret Hess MRN: [id number] Date of Birth: [birth year]  Today's Date: 07/10/2012  Patient has met 3 of 4 short term goals.  Pt did not fully meet the goal of bathing in shower with steady assist as she still needs min assist to wash the left side of her bottom. Overall, she has been making good progress with her trunk control, balance, use of hemi dressing techniques, and expression.  Patient continues to demonstrate the following deficits: hemiplegia with sensory loss, apraxia, decreased balance, abnormal tone, and expressive aphasia and therefore will continue to benefit from skilled OT intervention to enhance overall performance with BADL.  Patient progressing toward long term goals. Continue plan of care.  OT Short Term Goals Week 1:  OT Short Term Goal 1 (Week 1): Pt will maintain dynamic sitting balance with mod assist during selfcare activities. OT Short Term Goal 1 - Progress (Week 1): Met OT Short Term Goal 2 (Week 1): Pt will position the LUE prior and after transitional movements with mod questioning cues. OT Short Term Goal 2 - Progress (Week 1): Met OT Short Term Goal 3 (Week 1): Pt will perform all bathing with mod assist sit to stand. OT Short Term Goal 3 - Progress (Week 1): Met OT Short Term Goal 4 (Week 1): Pt will perform UB dressing with min assist to donn pullover shirt. OT Short Term Goal 4 - Progress (Week 1): Met OT Short Term Goal 5 (Week 1): Pt will perform toilet transfers with mod assist to 3:1. Week 2:  OT Short Term Goal 1 (Week 2): Pt will be able to stand at sink with min assist to allow her to pull up her pants with min assist. OT Short Term Goal 1 - Progress (Week 2): Met OT Short Term Goal 2 (Week 2): Pt will bathe in shower with steady assist using long sponge. OT Short Term Goal 2 - Progress (Week 2): Partly met OT Short Term Goal 3 (Week 2): Pt will perform self ROM to LUE with min  verbal cues. OT Short Term Goal 3 - Progress (Week 2): Met OT Short Term Goal 4 (Week 2): Pt will transfer to toilet with min assist. OT Short Term Goal 4 - Progress (Week 2): Met Week 3:  OT Short Term Goal 1 (Week 3): Pt will be able to transfer to toilet with steady assist. OT Short Term Goal 1 - Progress (Week 3): Progressing toward goal OT Short Term Goal 2 (Week 3): Pt will be able to bathe with steady assist. OT Short Term Goal 2 - Progress (Week 3): Progressing toward goal OT Short Term Goal 3 (Week 3): Pt will be able to pull pants over hips with steady assist. OT Short Term Goal 3 - Progress (Week 3): Progressing toward goal  Skilled Therapeutic Interventions/Progress Updates: OT 5-7 days a week for 60-90 min a day for  Balance/vestibular training;Discharge planning;DME/adaptive equipment instruction;Functional mobility training;Neuromuscular re-education;Patient/family education;Self Care/advanced ADL retraining;UE/LE Strength taining/ROM;UE/LE Coordination activities;Visual/perceptual remediation/compensation;Therapeutic Activities;Therapeutic Exercise;Psychosocial support to maximize her level of independence with self care.  Therapy Documentation Precautions:  Precautions Precautions: Fall Precaution Comments: aphasic; LLE flexor withdrawal at times Restrictions Weight Bearing Restrictions: No Pain: Pain Assessment Pain Assessment: 0-10 Pain Score:   6 Pain Type: Acute pain Pain Location: Back Pain Orientation: Lower Pain Descriptors: Aching Pain Frequency: Intermittent Pain Onset: Gradual Patients Stated Pain Goal: 2 Pain Intervention(s): Medication (See eMAR) ADL:  See FIM for current functional  status  Therapy/Group: Individual Therapy  Margaret Hess 07/10/2012, 9:39 AM

## 2012-07-10 NOTE — Progress Notes (Signed)
Physical Therapy Session Note  Patient Details  Name: Margaret Hess MRN: [id number] Date of Birth: [birth year]  Today's Date: 07/10/2012 Time: 0905-1000 Time Calculation (min): 55 min  Short Term Goals: Week 3:  PT Short Term Goal 1 (Week 3): Pt will roll R with 1 visual cue. PT Short Term Goal 2 (Week 3): Pt will transfer bed>< w/c with supervision. PT Short Term Goal 3 (Week 3): Pt will stand x 5 minutes during functional task with mod assist for balance. PT Short Term Goal 4 (Week 3): Pt will perform gait x 25' with LRAD and assist of 1 person.  Skilled Therapeutic Interventions/Progress Updates:  neuromuscular re-education via forced used, demo, tactile and VCs for :  Basic squat pivot transfers- facilitating LLE activation Scooting on mat- facilitating translation of pelvis over femurs, forward wt shift, = wt bearing Standing- focusing on LLE stance control, = wt bearing, extended trunk Gait x 5' forward and backward +2 skilled assist without AD, wearing L AFO, focusing on LLE stance control before stepping with RLE  Pt able to verbalize that she is fearful of shifting her wt forward in sitting, and prefers to have therapist or a table in front of her if possible.     Therapy Documentation Precautions:  Precautions Precautions: Fall Precaution Comments: aphasic; LLE flexor withdrawal at times Restrictions Weight Bearing Restrictions: No     See FIM for current functional status  Therapy/Group: Individual Therapy  Margaret Hess 07/10/2012, 10:59 AM

## 2012-07-10 NOTE — Progress Notes (Signed)
Patient ID: Margaret Hess, female   DOB: 03-14-1966, 46 y.o.   MRN: [id number]  Subjective/Complaints: Nerve pain L side increaed , spasms decreased Reviewed BPs Review of Systems  Unable to perform ROS: medical condition   Objective: Vital Signs: Blood pressure 115/74, pulse 99, temperature 98.3 F (36.8 C), temperature source Oral, resp. rate 19, height 5\' 6"  (1.676 m), weight 80.5 kg (177 lb 7.5 oz), SpO2 97.00%. No results found. Results for orders placed during the hospital encounter of 06/25/12 (from the past 72 hour(s))  GLUCOSE, CAPILLARY     Status: Abnormal   Collection Time   07/07/12 11:15 AM      Component Value Range Comment   Glucose-Capillary 142 (*) 70 - 99 mg/dL    Comment 1 Notify RN     GLUCOSE, CAPILLARY     Status: Abnormal   Collection Time   07/07/12  4:50 PM      Component Value Range Comment   Glucose-Capillary 112 (*) 70 - 99 mg/dL   GLUCOSE, CAPILLARY     Status: Abnormal   Collection Time   07/07/12  9:10 PM      Component Value Range Comment   Glucose-Capillary 130 (*) 70 - 99 mg/dL   PROTIME-INR     Status: Abnormal   Collection Time   07/08/12  6:30 AM      Component Value Range Comment   Prothrombin Time 28.1 (*) 11.6 - 15.2 seconds    INR 2.80 (*) 0.00 - 1.49   GLUCOSE, CAPILLARY     Status: Abnormal   Collection Time   07/08/12  7:48 AM      Component Value Range Comment   Glucose-Capillary 143 (*) 70 - 99 mg/dL    Comment 1 Notify RN     GLUCOSE, CAPILLARY     Status: Abnormal   Collection Time   07/08/12 11:07 AM      Component Value Range Comment   Glucose-Capillary 150 (*) 70 - 99 mg/dL    Comment 1 Notify RN     BASIC METABOLIC PANEL     Status: Abnormal   Collection Time   07/08/12  3:32 PM      Component Value Range Comment   Sodium 138  135 - 145 mEq/L    Potassium 4.8  3.5 - 5.1 mEq/L    Chloride 100  96 - 112 mEq/L    CO2 25  19 - 32 mEq/L    Glucose, Bld 149 (*) 70 - 99 mg/dL    BUN 18  6 - 23 mg/dL    Creatinine, Ser  4.09  0.50 - 1.10 mg/dL    Calcium 81.1  8.4 - 10.5 mg/dL    GFR calc non Af Amer >90  >90 mL/min    GFR calc Af Amer >90  >90 mL/min   GLUCOSE, CAPILLARY     Status: Abnormal   Collection Time   07/08/12  4:51 PM      Component Value Range Comment   Glucose-Capillary 154 (*) 70 - 99 mg/dL   GLUCOSE, CAPILLARY     Status: Abnormal   Collection Time   07/08/12  9:04 PM      Component Value Range Comment   Glucose-Capillary 154 (*) 70 - 99 mg/dL    Comment 1 Notify RN     PROTIME-INR     Status: Abnormal   Collection Time   07/09/12  7:00 AM      Component Value Range Comment  Prothrombin Time 24.3 (*) 11.6 - 15.2 seconds    INR 2.30 (*) 0.00 - 1.49   GLUCOSE, CAPILLARY     Status: Abnormal   Collection Time   07/09/12  7:24 AM      Component Value Range Comment   Glucose-Capillary 142 (*) 70 - 99 mg/dL    Comment 1 Notify RN     GLUCOSE, CAPILLARY     Status: Abnormal   Collection Time   07/09/12 11:41 AM      Component Value Range Comment   Glucose-Capillary 232 (*) 70 - 99 mg/dL    Comment 1 Notify RN     GLUCOSE, CAPILLARY     Status: Abnormal   Collection Time   07/09/12  4:37 PM      Component Value Range Comment   Glucose-Capillary 118 (*) 70 - 99 mg/dL   GLUCOSE, CAPILLARY     Status: Normal   Collection Time   07/09/12  8:53 PM      Component Value Range Comment   Glucose-Capillary 97  70 - 99 mg/dL   PROTIME-INR     Status: Abnormal   Collection Time   07/10/12  5:20 AM      Component Value Range Comment   Prothrombin Time 22.1 (*) 11.6 - 15.2 seconds    INR 2.03 (*) 0.00 - 1.49   BASIC METABOLIC PANEL     Status: Abnormal   Collection Time   07/10/12  5:20 AM      Component Value Range Comment   Sodium 135  135 - 145 mEq/L    Potassium 4.6  3.5 - 5.1 mEq/L    Chloride 96  96 - 112 mEq/L    CO2 26  19 - 32 mEq/L    Glucose, Bld 136 (*) 70 - 99 mg/dL    BUN 25 (*) 6 - 23 mg/dL    Creatinine, Ser 1.61  0.50 - 1.10 mg/dL    Calcium 9.8  8.4 - 09.6 mg/dL     GFR calc non Af Amer >90  >90 mL/min    GFR calc Af Amer >90  >90 mL/min   GLUCOSE, CAPILLARY     Status: Abnormal   Collection Time   07/10/12  7:27 AM      Component Value Range Comment   Glucose-Capillary 136 (*) 70 - 99 mg/dL    Comment 1 Notify RN        HEENT: normal Cardio: RRR Resp: CTA B/L GI: BS positive Extremity:  No Edema Skin:   Intact Neuro: Abnormal Sensory absent light touch and proprio, positive deep pain, Abnormal Motor 0/5 in LUE and LLE, Abnormal FMC Tone  reduced on L side, Tone:  Hypotonia, Aphasic, Inattention and Apraxic Musc/Skel:  Swelling L dorsum hand ecchymosis and swelling-mild Increased L hamstrings and hip adductor tone--unchanged  Assessment/Plan: 1. Functional deficits secondary to R MCA infarct L HP, crossed aphasia,apraxia  which require 3+ hours per day of interdisciplinary therapy in a comprehensive inpatient rehab setting. Physiatrist is providing close team supervision and 24 hour management of active medical problems listed below. Physiatrist and rehab team continue to assess barriers to discharge/monitor patient progress toward functional and medical goals. FIM: FIM - Bathing Bathing Steps Patient Completed: Chest;Right Arm;Left Arm;Abdomen;Front perineal area;Right upper leg;Left upper leg;Right lower leg (including foot);Left lower leg (including foot) Bathing: 4: Min-Patient completes 8-9 68f 10 parts or 75+ percent  FIM - Upper Body Dressing/Undressing Upper body dressing/undressing steps patient completed: Thread/unthread right  sleeve of pullover shirt/dresss;Thread/unthread left sleeve of pullover shirt/dress;Put head through opening of pull over shirt/dress;Pull shirt over trunk Upper body dressing/undressing: 5: Supervision: Safety issues/verbal cues FIM - Lower Body Dressing/Undressing Lower body dressing/undressing steps patient completed: Thread/unthread right underwear leg;Thread/unthread left underwear leg;Pull underwear  up/down;Thread/unthread right pants leg;Thread/unthread left pants leg;Pull pants up/down;Don/Doff right shoe Lower body dressing/undressing: 3: Mod-Patient completed 50-74% of tasks  FIM - Toileting Toileting steps completed by patient: Performs perineal hygiene Toileting Assistive Devices: Grab bar or rail for support Toileting: 2: Max-Patient completed 1 of 3 steps  FIM - Diplomatic Services operational officer Devices: Grab bars Toilet Transfers: 3-To toilet/BSC: Mod A (lift or lower assist);4-From toilet/BSC: Min A (steadying Pt. > 75%)  FIM - Bed/Chair Transfer Bed/Chair Transfer Assistive Devices: Arm rests Bed/Chair Transfer: 4: Supine > Sit: Min A (steadying Pt. > 75%/lift 1 leg);4: Bed > Chair or W/C: Min A (steadying Pt. > 75%)  FIM - Locomotion: Wheelchair Locomotion: Wheelchair: 1: Total Assistance/staff pushes wheelchair (Pt<25%) FIM - Locomotion: Ambulation Locomotion: Ambulation Assistive Devices: Walker - Eva;Orthosis (L AFO and LKI) Ambulation/Gait Assistance: 1: +2 Total assist Locomotion: Ambulation: 1: Two helpers  Comprehension Comprehension Mode: Auditory Comprehension: 5-Follows basic conversation/direction: With no assist  Expression Expression Mode: Verbal Expression: 3-Expresses basic 50 - 74% of the time/requires cueing 25 - 50% of the time. Needs to repeat parts of sentences.  Social Interaction Social Interaction Mode: Asleep Social Interaction: 6-Interacts appropriately with others with medication or extra time (anti-anxiety, antidepressant).  Problem Solving Problem Solving Mode: Asleep Problem Solving: 5-Solves basic 90% of the time/requires cueing < 10% of the time  Memory Memory Mode: Asleep Memory: 5-Recognizes or recalls 90% of the time/requires cueing < 10% of the time  Medical Problem List and Plan:  1. 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 DVT  Prophylaxis/Anticoagulation: Chronic Coumadin therapy with a goal INR of 2-2.5 secondary hemorrhagic transformation  3. Neuropsych: This patient is not capable of making decisions on his/her own behalf.  4. Hypertension/nonischemic cardiomyopathy with grade 3 diastolic dysfunction.Reduce  Lisinopril to  2.5 mg twice a day, Lanoxin 0.125 mg daily. Monitor with increased mobility. Cardiology to follow up as needed(Dr. Bensimhon), TED hose when OOB  5. Diabetes mellitus. Patient currently on sliding scale insulin. Patient on Glucophage 500 mg twice a day prior to admission and victoza 1.2 mL subcutaneously daily. Check blood sugars a.c. and at bedtime, Is on hi dose metformin 1000mg  BID with controlled CBGs.  If this fails then restart Victoza--improved control at present 6. Mood/depression. Patient on Wellbutrin prior to admission 75 mg daily. We'll discuss resuming this medication. Provide emotional support and positive reinforcement  7. 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  Back pain mild/mod will use K pad and analgesic balm  -discussed Vonna Kotyk back cushion with PT 9.  Spasticity  started Zanaflex monitor sedation, ok thus far  LOS (Days) 15 A FACE TO FACE EVALUATION WAS PERFORMED  KIRSTEINS,ANDREW E 07/10/2012, 9:18 AM

## 2012-07-10 NOTE — Progress Notes (Signed)
Speech Language Pathology Daily Session Note  Patient Details  Name: Margaret Hess MRN: [id number] Date of Birth: 1966/04/21  Today's Date: 07/10/2012 Time: 1305-1400 Time Calculation (min): 55 min  Short Term Goals: Week 3: SLP Short Term Goal 1 (Week 3): Patient will verbally complete automatic sequences with supervision level assist phonemic cues. SLP Short Term Goal 2 (Week 3): Patient will verbally produce phrase level (3-4 word) utterances to describe situations, pictures, etc. with min assist verbal and written cues.  SLP Short Term Goal 3 (Week 3): Patient will self monitor accuracy of verbal expression with min assist verbal and non-verbal cues. SLP Short Term Goal 4 (Week 3): Patient will write by copying basic, biographical information with min assist multi-modal cues to self monitor and correct errors. SLP Short Term Goal 5 (Week 3): During non-structed verbal expression tasks patient with produce grammaticcaly correct phrases with max assist multi-modal cues.  Skilled Therapeutic Interventions: Treatment session addressed phrase level (3-4 words) verbal expression to include articles, prepositions, pronouns and helping verbs with written template, visual and verbal cues with min assist faded to supervision cues to identify and self-correct errors. SLP then transition task to describe what people were doing in pictures which appeared to increase spontaneous productions of functors.  Patient independently wrote first and last name with 1 error that she was able to identify and correct after 4 trials.      FIM:  Comprehension Comprehension Mode: Auditory Comprehension: 5-Follows basic conversation/direction: With no assist Expression Expression Mode: Verbal Expression: 4-Expresses basic 75 - 89% of the time/requires cueing 10 - 24% of the time. Needs helper to occlude trach/needs to repeat words. Social Interaction Social Interaction: 6-Interacts appropriately with others with  medication or extra time (anti-anxiety, antidepressant). Problem Solving Problem Solving: 5-Solves basic 90% of the time/requires cueing < 10% of the time Memory Memory: 5-Recognizes or recalls 90% of the time/requires cueing < 10% of the time  Pain Pain Assessment Pain Assessment: No/denies pain  Therapy/Group: Individual Therapy  Charlane Ferretti., CCC-SLP [phone]  Haiven Nardone 07/10/2012, 4:41 PM

## 2012-07-11 ENCOUNTER — Inpatient Hospital Stay (HOSPITAL_COMMUNITY): Payer: BC Managed Care – PPO | Admitting: *Deleted

## 2012-07-11 LAB — GLUCOSE, CAPILLARY
Glucose-Capillary: 108 mg/dL — ABNORMAL HIGH (ref 70–99)
Glucose-Capillary: 163 mg/dL — ABNORMAL HIGH (ref 70–99)

## 2012-07-11 MED ORDER — WARFARIN SODIUM 3 MG PO TABS
3.0000 mg | ORAL_TABLET | Freq: Once | ORAL | Status: AC
  Administered 2012-07-11: 3 mg via ORAL
  Filled 2012-07-11: qty 1

## 2012-07-11 NOTE — Progress Notes (Signed)
Patient ID: Margaret Hess, female   DOB: [birth year], 46 y.o.   MRN: [id number]  Subjective/Complaints: She states she has chronic back pain but that's relatively unchanged. She has no other complaints.  Objective: Vital Signs: Blood pressure 89/60, pulse 79, temperature 98.5 F (36.9 C), temperature source Oral, resp. rate 18, height 5\' 6"  (1.676 m), weight 166 lb 10.7 oz (75.6 kg), SpO2 97.00%.  CBG (last 3)   Basename 07/11/12 0735 07/10/12 2117 07/10/12 1641  GLUCAP 185* 132* 128*     No acute distress. Chest clear to auscultation cardiac exam S1-S2 are regular. Abdominal exam active bowel sounds, soft. Extremities no edema.  Assessment/Plan:  Medical Problem List and Plan:  1. 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 DVT Prophylaxis/Anticoagulation: Chronic Coumadin therapy with a goal INR of 2-2.5 secondary hemorrhagic transformation  3. Neuropsych: This patient is not capable of making decisions on his/her own behalf.  4. Hypertension/nonischemic cardiomyopathy with grade 3 diastolic dysfunction.Reduce  Lisinopril to  2.5 mg twice a day, Lanoxin 0.125 mg daily. Monitor with increased mobility. Cardiology to follow up as needed(Dr. Bensimhon), TED hose when OOB  Pressure has been somewhat low. Will hold medications for systolic pressure less than 95. 5. Diabetes mellitus. CBG (last 3)   Basename 07/11/12 0735 07/10/12 2117 07/10/12 1641  GLUCAP 185* 132* 128*  6. Mood/depression. Patient on Wellbutrin prior to admission 75 mg daily. We'll discuss resuming this medication. Provide emotional support and positive reinforcement  7. Sensory dysesthesia-continue current therapy. 8.  Back pain mild/mod-chronic problem. No new therapy at this time.  9.  Spasticity  started Zanaflex monitor sedation, ok thus far  LOS (Days) 16 A FACE TO FACE EVALUATION WAS PERFORMED  Derreon Consalvo HENRY 07/11/2012, 10:06 AM

## 2012-07-11 NOTE — Consult Note (Signed)
  07/10/12  PSYCHOSOCIAL NOTE - CONFIDENTIAL  Margaret Hess Inpatient Rehabilitation   Margaret Hess was seen on the [facility] Unit. She was admitted to the unit s/p "acute cardio-embolic infarct [in the] right middle cerebral artery secondary to known [left ventricular] mural thrombus." She was referred for neuropsychological consultation to provide her with education regarding her condition and to assess for emotional distress.   According to medical records, a CT head scan performed on 06/23/2012 reportedly revealed an acute hemorrhagic infarction on the right with similar distribution as seen on her prior brain MRI. There was increased edema and mass effect compared to that scan which was performed on 06/21/2012.  Subjective: Margaret Hess reported being in generally good spirits in light of her present medical status. However, she also admitted to having good and bad days regarding her mood. She said that she has been treated very well during her stay on the unit and had no complaints. In regard to cognition, Margaret Hess reported that her speech was most affected by the stroke. Otherwise, she was not aware of any major changes. Of note, the patient believes that her cognitive and motor symptoms have been improving over time, but her left upper and lower extremities are still difficult to fully control, particularly her left arm. She purportedly has a great social support system that will help her when she gets home.   Objective: Margaret Hess appeared well kempt given her situation. She was fully oriented to person, place and situation. She did not appear to be in marked distress. She exhibited word finding difficulties and expressive aphasia. With the exception of her speech, no blatant difficulties were observed.   Assessment: Margaret Hess is suffering from aphasia following her stroke. She is also likely experiencing other cognitive difficulties that she may not fully  recognize, particularly given the type of stroke she suffered. In addition, while no major signs of clinical psychopathology were noted, she seems to be experiencing a mild adjustment reaction which is not unexpected.   Plan:    Supportive therapy and ego support is warranted during her stay. Although not recommended at this time, note that Margaret Hess was in interested in taking any psychopharmacological medication. However, this can be considered in the future if her emotional symptoms do not abate.    Cognitive testing on the unit would be helpful to assess her strengths and weaknesses even though the testing would be somewhat limited given her aphasia.   Upon discharge, she should be referred for comprehensive neuropsychological evaluation. In addition, routine follow-up with a cardiologist and stroke specialist would be beneficial.   Greater than 50% of this visit was spent educating the patient about the possible diagnosis, prognosis, management plan, and in coordination of care.   REFERRING DIAGNOSIS: Cerebrovascular accident  FINAL DIAGNOSES:  Cerebrovascular accident Aphasia Adjustment disorder with depression and anxiety  Total professional time including medical record review and feedback equals 4 units [id number]).   Debbe Mounts, Psy.D.  Clinical Neuropsychologist

## 2012-07-11 NOTE — Progress Notes (Addendum)
ANTICOAGULATION CONSULT NOTE - Follow Up Consult  Pharmacy Consult for Coumadin Indication: LV thrombus  No Known Allergies  Patient Measurements: Height: 5\' 6"  (167.6 cm) Weight: 166 lb 10.7 oz (75.6 kg) IBW/kg (Calculated) : 59.3   Vital Signs: Temp: 98.5 F (36.9 C) (11/09 0542) Temp src: Oral (11/09 0542) BP: 89/60 mmHg (11/09 0838) Pulse Rate: 79  (11/09 0838)  Labs:  Basename 07/11/12 0647 07/10/12 0520 07/09/12 0700 07/08/12 1532  HGB -- -- -- --  HCT -- -- -- --  PLT -- -- -- --  APTT -- -- -- --  LABPROT 23.4* 22.1* 24.3* --  INR 2.19* 2.03* 2.30* --  HEPARINUNFRC -- -- -- --  CREATININE -- 0.75 -- 0.59  CKTOTAL -- -- -- --  CKMB -- -- -- --  TROPONINI -- -- -- --    Estimated Creatinine Clearance: 91.3 ml/min (by C-G formula based on Cr of 0.75).  Assessment: 45yof discharged from [facility] on 10/18 on coumadin 5mg  daily and lovenox 80mg  q12 for new LV thrombus. INR 2.15 at time of discharge after only 2 doses of coumadin. Returns to the hospital that same night with difficulty speaking and L sided weakness. 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 INR 2.19. Lower goal desired due to recent bleed.   Goal of Therapy:  INR 2-2.5 (lower goal secondary to hemorrhagic transformation of CVA)   Plan:  Coumadin 3mg  today Continue daily PT/INR checks  Semisi Biela S. Merilynn Finland, PharmD, BCPS Clinical Staff Pharmacist Pager [phone]   07/11/2012, 12:57 PM

## 2012-07-11 NOTE — Progress Notes (Signed)
Occupational Therapy Session Note  Patient Details  Name: Misako Roeder MRN: [id number] Date of Birth: 1966-01-13  Today's Date: 07/11/2012 Time:  - 1415-1530  (75 min)    Short Term Goals: Week 2:  OT Short Term Goal 1 (Week 2): Pt will be able to stand at sink with min assist to allow her to pull up her pants with min assist. OT Short Term Goal 1 - Progress (Week 2): Met OT Short Term Goal 2 (Week 2): Pt will bathe in shower with steady assist using long sponge. OT Short Term Goal 2 - Progress (Week 2): Partly met OT Short Term Goal 3 (Week 2): Pt will perform self ROM to LUE with min verbal cues. OT Short Term Goal 3 - Progress (Week 2): Met OT Short Term Goal 4 (Week 2): Pt will transfer to toilet with min assist. OT Short Term Goal 4 - Progress (Week 2): Met Week 3:  OT Short Term Goal 1 (Week 3): Pt will be able to transfer to toilet with steady assist. OT Short Term Goal 1 - Progress (Week 3): Progressing toward goal OT Short Term Goal 2 (Week 3): Pt will be able to bathe with steady assist. OT Short Term Goal 2 - Progress (Week 3): Progressing toward goal OT Short Term Goal 3 (Week 3): Pt will be able to pull pants over hips with steady assist. OT Short Term Goal 3 - Progress (Week 3): Progressing toward goal  Skilled Therapeutic Interventions/Progress Updates:    Addressed functional transfers to toilet at wc level.  Pt. Transferred to toilet going to left with mod assist with one LOB.  She was minimal assist with toileting needing verbal cues for body positioning and left foot placement.  She was minimal assist going to the right.  Instructed pt to look to left as she propelled wc down the hall with max cues to attend to the left.  She hit wall several times.  Transferred to mat.  Addressed LUE stretching with myotherapy to address tightness and pain in forearm region.  Pt reports she has nerve pain at night in her arm.  Educated her on correct positioning of arm when she is  sitting.  Did sit to half stand with weight bearing maintained on LUE and pushing with RUE from thigh.  Pt held for 5 seconds before sitting.  Returned to room and transferred pt to bed with emphasis on doing the transfer with increased time and proper set up.  Pt. Needed assist with footrests but transferred to right with minimal assist.  Left pt in bed with call bell and phone in reach  Therapy Documentation Precautions:  Precautions Precautions: Fall Precaution Comments: aphasic; LLE flexor withdrawal at times Restrictions Weight Bearing Restrictions: No      Pain:  Left elbow pain intermittently during session, but no need for pain meds.       See FIM for current functional status  Therapy/Group: Individual Therapy  Laurey, Salser 07/11/2012, 3:35 PM

## 2012-07-12 ENCOUNTER — Inpatient Hospital Stay (HOSPITAL_COMMUNITY): Payer: BC Managed Care – PPO | Admitting: Physical Therapy

## 2012-07-12 LAB — GLUCOSE, CAPILLARY
Glucose-Capillary: 137 mg/dL — ABNORMAL HIGH (ref 70–99)
Glucose-Capillary: 139 mg/dL — ABNORMAL HIGH (ref 70–99)

## 2012-07-12 LAB — PROTIME-INR
INR: 2.28 — ABNORMAL HIGH (ref 0.00–1.49)
Prothrombin Time: 24.1 seconds — ABNORMAL HIGH (ref 11.6–15.2)

## 2012-07-12 MED ORDER — WARFARIN SODIUM 2.5 MG PO TABS
2.5000 mg | ORAL_TABLET | Freq: Every day | ORAL | Status: DC
  Administered 2012-07-12 – 2012-07-21 (×10): 2.5 mg via ORAL
  Filled 2012-07-12 (×12): qty 1

## 2012-07-12 NOTE — Progress Notes (Addendum)
Physical Therapy Note  Patient Details  Name: Margaret Hess MRN: [id number] Date of Birth: [birth year] Today's Date: 07/12/2012  1030-1100  (30 minutes) individual Pain: no complaint of pain Focus of treatment: gait training Treatment: Pt in bed upon arrival requesting to use bathroom. Supine to sit using bed rail min assist LT LE; transfer- squat to stand /turn min/mod assist be to wc; wc to toilet using safety rail min/mod assist. ; gait 25 feet X 1 with left PFRW + LT AFO + knee immobilizer left knee mod assist for balance and to assist to advance LT LE approximately 50% of time secondary to foot drag.    1300-1325 (25 minutes) individual Pain: no complaint of pain Focus of treatment: Gait training with PFRW Treatment: Gait 30 feet X 2 with left PFRW mod assist with min/mod vcs for sequencing and tactile cues for LT terminal knee extension in stance; up/down 4 inch step LT LE for quad control/weight bearing LT LE mod assist.  [name],JIM 07/12/2012, 7:43 AM

## 2012-07-12 NOTE — Progress Notes (Signed)
Patient seen and examined with Ulyess Blossom, PA-C. We discussed all aspects of the encounter. I agree with the assessment and plan as stated above.

## 2012-07-12 NOTE — Progress Notes (Signed)
Patient ID: Margaret Hess, female   DOB: August 18, 1966, 46 y.o.   MRN: [id number]  Subjective/Complaints: Continues to have back pain. This is a chronic problem and unchanged since being in the hospital.  Review of systems: She denies chest pain, shortness of breath, neurologic deficits.  Objective: Vital Signs: Blood pressure 112/71, pulse 98, temperature 98.2 F (36.8 C), temperature source Oral, resp. rate 18, height 5\' 6"  (1.676 m), weight 168 lb 10.4 oz (76.5 kg), SpO2 100.00%.  CBG (last 3)   Basename 07/12/12 0746 07/11/12 2052 07/11/12 1627  GLUCAP 137* 110* 163*    She appears well. Chest is clear to auscultation cardiac exam S1-S2 are regular. Abdominal exam active bowel sounds, soft. Extremities no edema.  Assessment/Plan:  Medical Problem List and Plan:  1. 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 DVT Prophylaxis/Anticoagulation: Chronic Coumadin therapy with a goal INR of 2-2.5 secondary hemorrhagic transformation  3. Neuropsych: This patient is not capable of making decisions on his/her own behalf.  4. 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 Pressure has been somewhat low. Will hold medications for systolic pressure less than 95. 5. Diabetes mellitus. CBG (last 3)   Basename 07/12/12 0746 07/11/12 2052 07/11/12 1627  GLUCAP 137* 110* 163*  6. Mood/depression. Patient on Wellbutrin prior to admission 75 mg daily. We'll discuss resuming this medication. Provide emotional support and positive reinforcement  7. Sensory dysesthesia-continue current therapy. 8.  Back pain mild/mod-chronic problem. No new therapy at this time.  9.  Spasticity  started Zanaflex monitor sedation, ok thus far  LOS (Days) 17 A FACE TO FACE EVALUATION WAS PERFORMED  SWORDS,BRUCE HENRY 07/12/2012, 8:57 AM

## 2012-07-12 NOTE — Progress Notes (Signed)
ANTICOAGULATION CONSULT NOTE - Follow Up Consult  Pharmacy Consult for Coumadin Indication: LV thrombus  No Known Allergies  Patient Measurements: Height: 5\' 6"  (167.6 cm) Weight: 168 lb 10.4 oz (76.5 kg) IBW/kg (Calculated) : 59.3   Vital Signs: Temp: 98.2 F (36.8 C) (11/10 0540) Temp src: Oral (11/10 0540) BP: 119/70 mmHg (11/10 0911) Pulse Rate: 100  (11/10 0911)  Labs:  Basename 07/12/12 0657 07/11/12 0647 07/10/12 0520  HGB -- -- --  HCT -- -- --  PLT -- -- --  APTT -- -- --  LABPROT 24.1* 23.4* 22.1*  INR 2.28* 2.19* 2.03*  HEPARINUNFRC -- -- --  CREATININE -- -- 0.75  CKTOTAL -- -- --  CKMB -- -- --  TROPONINI -- -- --    Estimated Creatinine Clearance: 91.8 ml/min (by C-G formula based on Cr of 0.75).  Assessment: 45yof discharged from [facility] on 10/18 on coumadin 5mg  daily and lovenox 80mg  q12 for new LV thrombus. INR 2.15 at time of discharge after only 2 doses of coumadin. Returns to the hospital that same night with difficulty speaking and L sided weakness. 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 INR 2.28. Lower goal desired due to recent bleed.   Goal of Therapy:  INR 2-2.5 (lower goal secondary to hemorrhagic transformation of CVA)   Plan:  Try Coumadin 2.5mg  daily for maintenance. Continue daily PT/INR checks  Oluwatobi Visser S. Merilynn Finland, PharmD, BCPS Clinical Staff Pharmacist Pager [phone]   07/12/2012, 10:09 AM

## 2012-07-13 ENCOUNTER — Inpatient Hospital Stay (HOSPITAL_COMMUNITY): Payer: BC Managed Care – PPO | Admitting: Occupational Therapy

## 2012-07-13 ENCOUNTER — Inpatient Hospital Stay (HOSPITAL_COMMUNITY): Payer: BC Managed Care – PPO | Admitting: Speech Pathology

## 2012-07-13 ENCOUNTER — Inpatient Hospital Stay (HOSPITAL_COMMUNITY): Payer: BC Managed Care – PPO

## 2012-07-13 DIAGNOSIS — I6350 Cerebral infarction due to unspecified occlusion or stenosis of unspecified cerebral artery: Secondary | ICD-10-CM

## 2012-07-13 LAB — PROTIME-INR: Prothrombin Time: 24.3 seconds — ABNORMAL HIGH (ref 11.6–15.2)

## 2012-07-13 LAB — GLUCOSE, CAPILLARY: Glucose-Capillary: 166 mg/dL — ABNORMAL HIGH (ref 70–99)

## 2012-07-13 MED ORDER — GABAPENTIN 300 MG PO CAPS
600.0000 mg | ORAL_CAPSULE | Freq: Three times a day (TID) | ORAL | Status: DC
  Administered 2012-07-13 – 2012-07-22 (×26): 600 mg via ORAL
  Filled 2012-07-13 (×30): qty 2

## 2012-07-13 NOTE — Progress Notes (Signed)
ANTICOAGULATION CONSULT NOTE - Follow Up Consult  Pharmacy Consult for Coumadin Indication: LV thrombus  No Known Allergies  Patient Measurements: Height: 5\' 6"  (167.6 cm) Weight: 167 lb 12.3 oz (76.1 kg) IBW/kg (Calculated) : 59.3   Vital Signs: Temp: 97.8 F (36.6 C) (11/11 0440) Temp src: Oral (11/11 0440) BP: 121/85 mmHg (11/11 0753) Pulse Rate: 85  (11/11 0753)  Labs:  Basename 07/13/12 0640 07/12/12 0657 07/11/12 0647  HGB -- -- --  HCT -- -- --  PLT -- -- --  APTT -- -- --  LABPROT 24.3* 24.1* 23.4*  INR 2.30* 2.28* 2.19*  HEPARINUNFRC -- -- --  CREATININE -- -- --  CKTOTAL -- -- --  CKMB -- -- --  TROPONINI -- -- --    Estimated Creatinine Clearance: 91.6 ml/min (by C-G formula based on Cr of 0.75).  Assessment: 45yof discharged from [facility] on 10/18 on coumadin 5mg  daily and lovenox 80mg  q12 for new LV thrombus. INR 2.15 at time of discharge after only 2 doses of coumadin. Returns to the hospital that same night with difficulty speaking and L sided weakness. 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 INR 2.3. Lower goal desired due to recent bleed.   Goal of Therapy:  INR 2-2.5 (lower goal secondary to hemorrhagic transformation of CVA)   Plan:  Continue Coumadin 2.5mg  daily for maintenance. Continue daily PT/INR checks  Thank you for allowing pharmacy to be a part of this patients care team.  Lovenia Kim Pharm.D., BCPS Clinical Pharmacist 07/13/2012 8:08 AM Pager: [phone] Phone: [phone]

## 2012-07-13 NOTE — Progress Notes (Signed)
Speech Language Pathology Daily Session Note  Patient Details  Name: Margaret Hess MRN: [id number] Date of Birth: 15-Jul-1966  Today's Date: 07/13/2012 Time: 1450-1550 Time Calculation (min): 60 min  Short Term Goals: Week 3: SLP Short Term Goal 1 (Week 3): Patient will verbally complete automatic sequences with supervision level assist phonemic cues. SLP Short Term Goal 2 (Week 3): Patient will verbally produce phrase level (3-4 word) utterances to describe situations, pictures, etc. with min assist verbal and written cues.  SLP Short Term Goal 3 (Week 3): Patient will self monitor accuracy of verbal expression with min assist verbal and non-verbal cues. SLP Short Term Goal 4 (Week 3): Patient will write by copying basic, biographical information with min assist multi-modal cues to self monitor and correct errors. SLP Short Term Goal 5 (Week 3): During non-structed verbal expression tasks patient with produce grammaticcaly correct phrases with max assist multi-modal cues.  Skilled Therapeutic Interventions: Treatment session addressed verbally generating grammatically correct sentences that describe what people in pictures were doing.  SLP facilitated session with supervision verbal and written cues to accurately use articles and mod assist verbal and written cues to accurately use helping verbs.  Patient's length of utterance was expanded to 5-8 words per sentence today.  Patient was able to verbally communicate basic wants and needs such as pain and meal selections with supervision verbal cues to self-monitor accuracy of verbal expression (not accounting for grammar.   Pain Pain Assessment Pain Assessment: 0-10 Pain Score:   7 Pain Type: Acute pain Pain Location:  (all over) Pain Orientation: Left Pain Descriptors: Aching Patients Stated Pain Goal: 2 Pain Intervention(s): RN made aware Multiple Pain Sites: No  Therapy/Group: Individual Therapy  Charlane Ferretti.,  CCC-SLP [phone]  Margaret Hess 07/13/2012, 4:31 PM

## 2012-07-13 NOTE — Progress Notes (Addendum)
Advanced Heart Failure Rounding Note   Subjective:    46 yo woman with history of DM,  HTN, NICM,  EF <20%.  LV thrombus, and Grade 3 diastolic dysfunction.  Mod TR.   Discharged from [facility] 06/19/12 after being treated for acute systolic heart failure and LV thrombus. Discharged on loveneox and coumadin.   Presented to ED 06/20/12 with difficulty speaking and L sided weakness. CT of head negative. 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 Working with rehab. LLE strength and expressive aphasia improving some.   Last week Spironolactone 12.5 mg added. Most recent renal function has remained stable. Weight up 10 pounds over the last 2 days. INR 2.8.  07/07/12 ECHO EF 20-25% grade II diastolic dysfunction  Denies SOB/orthopnea/PND.  No edema.  Doing well with rehab but left side remains weak.  Objective:     Vital Signs:   Temp:  [97.8 F (36.6 C)-98.6 F (37 C)] 97.8 F (36.6 C) (11/11 0440) Pulse Rate:  [80-100] 85  (11/11 0753) Resp:  [16-18] 18  (11/11 0440) BP: (103-121)/(68-85) 121/85 mmHg (11/11 0753) SpO2:  [96 %-100 %] 96 % (11/11 0440) Weight:  [76.1 kg (167 lb 12.3 oz)] 76.1 kg (167 lb 12.3 oz) (11/11 0440) Last BM Date: 07/11/12  Weight change: Filed Weights   07/11/12 0546 07/12/12 0540 07/13/12 0440  Weight: 75.6 kg (166 lb 10.7 oz) 76.5 kg (168 lb 10.4 oz) 76.1 kg (167 lb 12.3 oz)    Intake/Output:   Intake/Output Summary (Last 24 hours) at 07/13/12 0810 Last data filed at 07/12/12 1834  Gross per 24 hour  Intake    480 ml  Output      0 ml  Net    480 ml     Physical Exam:            General:  Well appearing. No resp difficulty sitting in WC HEENT: L facial droop Neck: supple. JVP 6-7.   Carotids 2+ bilat; no bruits. No lymphadenopathy or thryomegaly appreciated. Cor: PMI nondisplaced. Regular.  No gallops, rubs,  or murmurs. Lungs: clear Abdomen: soft, nontender, nondistended. No hepatosplenomegaly. No bruits or masses. Good bowel sounds. Extremities: no cyanosis, clubbing, rash, edema L arm flaccid Neuro: still with expressive aphasia. Writing things down. Weak on L    Labs: Basic Metabolic Panel:  Lab 07/10/12 1610 07/08/12 1532  NA 135 138  K 4.6 4.8  CL 96 100  CO2 26 25  GLUCOSE 136* 149*  BUN 25* 18  CREATININE 0.75 0.59  CALCIUM 9.8 10.0  MG -- --  PHOS -- --    Liver Function Tests: No results found for this basename: AST:5,ALT:5,ALKPHOS:5,BILITOT:5,PROT:5,ALBUMIN:5 in the last 168 hours No results found for this basename: LIPASE:5,AMYLASE:5 in the last 168 hours No results found for this basename: AMMONIA:3 in the last 168 hours  CBC: No results found for this basename: WBC:5,NEUTROABS:5,HGB:5,HCT:5,MCV:5,PLT:5 in the last 168 hours  Cardiac Enzymes: No results found for this basename: CKTOTAL:5,CKMB:5,CKMBINDEX:5,TROPONINI:5 in the last 168 hours  BNP: BNP (last 3 results)  Basename 06/17/12 0455 06/15/12 1720  PROBNP 1930.0* 6932.0*       Imaging: No results found.   Medications:     Scheduled Medications:    . carvedilol  3.125 mg Oral BID WC  . digoxin  0.125 mg Oral Daily  . furosemide  40 mg Oral Daily  . gabapentin  400 mg Oral TID  . insulin aspart  0-15 Units Subcutaneous TID WC  . lisinopril  2.5 mg Oral BID  . metFORMIN  1,000 mg Oral BID WC  . pantoprazole  40 mg Oral QHS  . spironolactone  12.5 mg Oral Daily  . tiZANidine  2 mg Oral TID  . warfarin  2.5 mg Oral q1800  . Warfarin - Pharmacist Dosing Inpatient   Does not apply q1800    Infusions:    PRN Medications: acetaminophen, albuterol, Muscle Rub, ondansetron (ZOFRAN) IV, ondansetron, polyethylene glycol, sorbitol, traZODone   Assessment:   1. CVA, acute hemorrhagic R MCA    --c/b expressive aphasia and L-sided hemiparesis 2. 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 NICM, EF <20% 4.  LV thrombus  5. DM   6. Hypertension  Plan/Discussion:    Volume status looks great.  Will continue current diuretics.  Increase carvedilol 6.25 mg BID.  Continue to follow daily weights.  Will check BMET mid week.    Continue coumadin for LV thrombus although no longer visual on echo.  No bleeding.  Appreciate rehabs help.     Patient seen and examined with Ulyess Blossom, PA-C. We discussed all aspects of the encounter. I agree with the assessment and plan as stated above.   Doing well from a HF perspective. Volume status looks good. She continues with significant motor and language deficits. Appreciate Rehab's help. No new recs today. We did speak with pharmacy about the possibility of reducing the lab draws for INR checks.  Marvalene Barrett,MD 11:45 PM

## 2012-07-13 NOTE — Progress Notes (Signed)
Physical Therapy Session Note  Patient Details  Name: Margaret Hess MRN: [id number] Date of Birth: 1966/08/01  Today's Date: 07/13/2012 Time: 1110-1140 Time Calculation (min): 30 min  Short Term Goals: Week 2:  PT Short Term Goal 1 (Week 2): Pt will perform rolling R after  2 VCs with supervision. PT Short Term Goal 1 - Progress (Week 2): Met PT Short Term Goal 2 (Week 2): Pt will perform basic transfer with min assist. PT Short Term Goal 2 - Progress (Week 2): Met PT Short Term Goal 3 (Week 2): Pt will stand during functional activity x 10 minutes with mod assist for balance. PT Short Term Goal 3 - Progress (Week 2): Not met PT Short Term Goal 4 (Week 2): Pt will perform gait x 10' with LRAD, assist of 1 person. PT Short Term Goal 4 - Progress (Week 2): Partly met PT Short Term Goal 5 (Week 2): Pt will propel w/c 150' with supervision, with 0-1 Vc for route finding. PT Short Term Goal 5 - Progress (Week 2): Met  Skilled Therapeutic Interventions/Progress Updates:  Patient transferred wheelchair to mat with min assist. Patient primarily used right extremities and has little weight bearing through left LE. Patient worked on facilitation of left LE through sit to squats, sit to stands and weight shifting in standing. Patient complained of dizziness one time during session. Patient sit to supine with supervision. Patient performed bridging, 1-leg bridging and hip flexion exercises to facilitate left hip.  Patient transferred from mat to wheelchair with moderate assistance going to left. Patient ambulated with rolling walker/platform on left and left AFO 28 feet x 1 and 15 feet x 1 on level tile surfaces in controlled environment. Patient was able to progress left LE independently. Patient demonstrated decreased stance time on left. Patient required facilitation of knee to prevent hyperextension of knee and required assistance to keep walker straight. Patient propelled wheelchair using right  extremities about 30 feet. Patient reported "she was out of juice".   Therapy Documentation Precautions:  Precautions Precautions: Fall Precaution Comments: aphasic; LLE flexor withdrawal at times Restrictions Weight Bearing Restrictions: No  Pain: Pain Assessment Pain Assessment: No/denies pain  Locomotion : Ambulation Ambulation/Gait Assistance: 3: Mod assist   See FIM for current functional status  Therapy/Group: Individual Therapy  Arelia Longest M 07/13/2012, 12:09 PM

## 2012-07-13 NOTE — Progress Notes (Signed)
Patient ID: Margaret Hess, female   DOB: September 19, 1965, 46 y.o.   MRN: [id number]  Subjective/Complaints: Nerve pain L side increased , spasms decreased Reviewed BPs Review of Systems  Unable to perform ROS: medical condition   Objective: Vital Signs: Blood pressure 121/85, pulse 85, temperature 97.8 F (36.6 C), temperature source Oral, resp. rate 18, height 5\' 6"  (1.676 m), weight 76.1 kg (167 lb 12.3 oz), SpO2 96.00%. No results found. Results for orders placed during the hospital encounter of 06/25/12 (from the past 72 hour(s))  GLUCOSE, CAPILLARY     Status: Abnormal   Collection Time   07/10/12 11:15 AM      Component Value Range Comment   Glucose-Capillary 182 (*) 70 - 99 mg/dL    Comment 1 Notify RN     GLUCOSE, CAPILLARY     Status: Abnormal   Collection Time   07/10/12  4:41 PM      Component Value Range Comment   Glucose-Capillary 128 (*) 70 - 99 mg/dL   GLUCOSE, CAPILLARY     Status: Abnormal   Collection Time   07/10/12  9:17 PM      Component Value Range Comment   Glucose-Capillary 132 (*) 70 - 99 mg/dL   PROTIME-INR     Status: Abnormal   Collection Time   07/11/12  6:47 AM      Component Value Range Comment   Prothrombin Time 23.4 (*) 11.6 - 15.2 seconds    INR 2.19 (*) 0.00 - 1.49   GLUCOSE, CAPILLARY     Status: Abnormal   Collection Time   07/11/12  7:35 AM      Component Value Range Comment   Glucose-Capillary 185 (*) 70 - 99 mg/dL    Comment 1 Notify RN     GLUCOSE, CAPILLARY     Status: Abnormal   Collection Time   07/11/12 11:34 AM      Component Value Range Comment   Glucose-Capillary 108 (*) 70 - 99 mg/dL    Comment 1 Notify RN     GLUCOSE, CAPILLARY     Status: Abnormal   Collection Time   07/11/12  4:27 PM      Component Value Range Comment   Glucose-Capillary 163 (*) 70 - 99 mg/dL    Comment 1 Notify RN     GLUCOSE, CAPILLARY     Status: Abnormal   Collection Time   07/11/12  8:52 PM      Component Value Range Comment   Glucose-Capillary 110  (*) 70 - 99 mg/dL    Comment 1 Notify RN     PROTIME-INR     Status: Abnormal   Collection Time   07/12/12  6:57 AM      Component Value Range Comment   Prothrombin Time 24.1 (*) 11.6 - 15.2 seconds    INR 2.28 (*) 0.00 - 1.49   GLUCOSE, CAPILLARY     Status: Abnormal   Collection Time   07/12/12  7:46 AM      Component Value Range Comment   Glucose-Capillary 137 (*) 70 - 99 mg/dL    Comment 1 Notify RN     GLUCOSE, CAPILLARY     Status: Abnormal   Collection Time   07/12/12 11:42 AM      Component Value Range Comment   Glucose-Capillary 150 (*) 70 - 99 mg/dL    Comment 1 Notify RN     GLUCOSE, CAPILLARY     Status: Abnormal   Collection Time  07/12/12  4:32 PM      Component Value Range Comment   Glucose-Capillary 139 (*) 70 - 99 mg/dL    Comment 1 Notify RN     GLUCOSE, CAPILLARY     Status: Abnormal   Collection Time   07/12/12  8:58 PM      Component Value Range Comment   Glucose-Capillary 126 (*) 70 - 99 mg/dL    Comment 1 Notify RN     PROTIME-INR     Status: Abnormal   Collection Time   07/13/12  6:40 AM      Component Value Range Comment   Prothrombin Time 24.3 (*) 11.6 - 15.2 seconds    INR 2.30 (*) 0.00 - 1.49   GLUCOSE, CAPILLARY     Status: Abnormal   Collection Time   07/13/12  7:51 AM      Component Value Range Comment   Glucose-Capillary 163 (*) 70 - 99 mg/dL      HEENT: normal Cardio: RRR Resp: CTA B/L GI: BS positive Extremity:  No Edema Skin:   Intact Neuro: Abnormal Sensory absent light touch and proprio, positive deep pain, Abnormal Motor 0/5 in LUE and LLE, Abnormal FMC Tone  reduced on L side, Tone:  Hypotonia, Aphasic, Inattention and Apraxic Musc/Skel:  Swelling L dorsum hand ecchymosis and swelling-mild Increased L hamstrings and hip adductor tone--unchanged  Assessment/Plan: 1. Functional deficits secondary to R MCA infarct L HP, crossed aphasia,apraxia  which require 3+ hours per day of interdisciplinary therapy in a comprehensive  inpatient rehab setting. Physiatrist is providing close team supervision and 24 hour management of active medical problems listed below. Physiatrist and rehab team continue to assess barriers to discharge/monitor patient progress toward functional and medical goals. FIM: FIM - Bathing Bathing Steps Patient Completed: Chest;Right Arm;Left Arm;Abdomen;Front perineal area;Right upper leg;Left upper leg;Right lower leg (including foot);Left lower leg (including foot) Bathing: 4: Min-Patient completes 8-9 80f 10 parts or 75+ percent  FIM - Upper Body Dressing/Undressing Upper body dressing/undressing steps patient completed: Thread/unthread right sleeve of pullover shirt/dresss;Thread/unthread left sleeve of pullover shirt/dress;Put head through opening of pull over shirt/dress;Pull shirt over trunk Upper body dressing/undressing: 5: Supervision: Safety issues/verbal cues FIM - Lower Body Dressing/Undressing Lower body dressing/undressing steps patient completed: Thread/unthread right underwear leg;Thread/unthread left underwear leg;Thread/unthread right pants leg;Thread/unthread left pants leg;Don/Doff right shoe Lower body dressing/undressing: 3: Mod-Patient completed 50-74% of tasks  FIM - Toileting Toileting steps completed by patient: Performs perineal hygiene Toileting Assistive Devices: Grab bar or rail for support Toileting: 2: Max-Patient completed 1 of 3 steps  FIM - Diplomatic Services operational officer Devices: Grab bars Toilet Transfers: 4-To toilet/BSC: Min A (steadying Pt. > 75%);4-From toilet/BSC: Min A (steadying Pt. > 75%)  FIM - Bed/Chair Transfer Bed/Chair Transfer Assistive Devices: Arm rests Bed/Chair Transfer: 4: Supine > Sit: Min A (steadying Pt. > 75%/lift 1 leg);4: Bed > Chair or W/C: Min A (steadying Pt. > 75%)  FIM - Locomotion: Wheelchair Locomotion: Wheelchair: 1: Total Assistance/staff pushes wheelchair (Pt<25%) FIM - Locomotion: Ambulation Locomotion:  Ambulation Assistive Devices: Walker - Eva;Orthosis (L AFO and LKI) Ambulation/Gait Assistance: 1: +2 Total assist Locomotion: Ambulation: 1: Two helpers  Comprehension Comprehension Mode: Auditory Comprehension: 4-Understands basic 75 - 89% of the time/requires cueing 10 - 24% of the time  Expression Expression Mode: Verbal Expression: 4-Expresses basic 75 - 89% of the time/requires cueing 10 - 24% of the time. Needs helper to occlude trach/needs to repeat words.  Social Interaction Social Interaction Mode:  Asleep Social Interaction: 5-Interacts appropriately 90% of the time - Needs monitoring or encouragement for participation or interaction.  Problem Solving Problem Solving Mode: Asleep Problem Solving: 2-Solves basic 25 - 49% of the time - needs direction more than half the time to initiate, plan or complete simple activities  Memory Memory Mode: Asleep Memory: 2-Recognizes or recalls 25 - 49% of the time/requires cueing 51 - 75% of the time  Medical Problem List and Plan:  1. 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 DVT Prophylaxis/Anticoagulation: Chronic Coumadin therapy with a goal INR of 2-2.5 secondary hemorrhagic transformation  3. Neuropsych: This patient is not capable of making decisions on his/her own behalf.  4. Hypertension/nonischemic cardiomyopathy with grade 3 diastolic dysfunction.Reduce  Lisinopril to  2.5 mg twice a day, Lanoxin 0.125 mg daily. Monitor with increased mobility. Cardiology to follow up as needed(Dr. Bensimhon), TED hose when OOB Discussed with cardiology today no changes 5. Diabetes mellitus. Patient currently on sliding scale insulin. Patient on Glucophage 500 mg twice a day prior to admission and victoza 1.2 mL subcutaneously daily. Check blood sugars a.c. and at bedtime, Is on hi dose metformin 1000mg  BID with controlled CBGs.  If this fails then restart Victoza--improved control at  present 6. Mood/depression. Patient on Wellbutrin prior to admission 75 mg daily. We'll discuss resuming this medication. Provide emotional support and positive reinforcement  7. 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  Back pain mild/mod will use K pad and analgesic balm  -discussed Vonna Kotyk back cushion with PT 9.  Spasticity  started Zanaflex monitor sedation, ok thus far  LOS (Days) 18 A FACE TO FACE EVALUATION WAS PERFORMED  Jolette Lana E 07/13/2012, 10:20 AM

## 2012-07-13 NOTE — Progress Notes (Signed)
Occupational Therapy Session Note  Patient Details  Name: Margaret Hess MRN: [id number] Date of Birth: 04-May-1966  Today's Date: 07/13/2012 Time: 0805-0905 and 1110-1140 Time Calculation (min): 60 min and 30 min  Short Term Goals: Week 1:  OT Short Term Goal 1 (Week 1): Pt will maintain dynamic sitting balance with mod assist during selfcare activities. OT Short Term Goal 1 - Progress (Week 1): Met OT Short Term Goal 2 (Week 1): Pt will position the LUE prior and after transitional movements with mod questioning cues. OT Short Term Goal 2 - Progress (Week 1): Met OT Short Term Goal 3 (Week 1): Pt will perform all bathing with mod assist sit to stand. OT Short Term Goal 3 - Progress (Week 1): Met OT Short Term Goal 4 (Week 1): Pt will perform UB dressing with min assist to donn pullover shirt. OT Short Term Goal 4 - Progress (Week 1): Met OT Short Term Goal 5 (Week 1): Pt will perform toilet transfers with mod assist to 3:1. Week 2:  OT Short Term Goal 1 (Week 2): Pt will be able to stand at sink with min assist to allow her to pull up her pants with min assist. OT Short Term Goal 1 - Progress (Week 2): Met OT Short Term Goal 2 (Week 2): Pt will bathe in shower with steady assist using long sponge. OT Short Term Goal 2 - Progress (Week 2): Partly met OT Short Term Goal 3 (Week 2): Pt will perform self ROM to LUE with min verbal cues. OT Short Term Goal 3 - Progress (Week 2): Met OT Short Term Goal 4 (Week 2): Pt will transfer to toilet with min assist. OT Short Term Goal 4 - Progress (Week 2): Met Week 3:  OT Short Term Goal 1 (Week 3): Pt will be able to transfer to toilet with steady assist. OT Short Term Goal 1 - Progress (Week 3): Progressing toward goal OT Short Term Goal 2 (Week 3): Pt will be able to bathe with steady assist. OT Short Term Goal 2 - Progress (Week 3): Progressing toward goal OT Short Term Goal 3 (Week 3): Pt will be able to pull pants over hips with steady  assist. OT Short Term Goal 3 - Progress (Week 3): Progressing toward goal  Skilled Therapeutic Interventions/Progress Updates:    Visit 1:    Pt seen for BADL retraining of toileting, bathing at shower level, and dressing with a focus on sit to stand, transfers, standing balance, LUE management.  Pt well this am with LUE management to bathe around her arms and don shirt. She often needs to cues on how to begin a dressing task, even though she has worked on it multiple times.  Decreased tone in LUE, worked on weight bearing on sink in standing activities.  Visit 2:  Pt seen for LUE neuro re-ed with weight bearing activities in various positions.  Forward reaching and dynamic balance activities.  Sit to stand and static standing with mirror feedback.. Pt stated that she felt the mirror was very helpful to her in helping her to find her midline.    Therapy Documentation Precautions:  Precautions Precautions: Fall Precaution Comments: aphasic; LLE flexor withdrawal at times Restrictions Weight Bearing Restrictions: No   Pain: Pain Assessment Pain Assessment: No/denies pain ADL:   See FIM for current functional status  Therapy/Group: Individual Therapy  Feather Berrie 07/13/2012, 11:55 AM

## 2012-07-13 NOTE — Progress Notes (Addendum)
Recreational Therapy Session Note  Patient Details  Name: Margaret Hess MRN: [id number] Date of Birth: 12/12/1965 Today's Date: 07/13/2012 Time:  1350-1400; 4098-1191 Pain: no c/o Skilled Therapeutic Interventions/Progress Updates: Discussed purpose of community reintegration/outing and pt agreeable to participate in outing tomorrow.  Therapy/Group: Individual Therapy  Tavish Gettis 07/13/2012, 4:48 PM

## 2012-07-14 ENCOUNTER — Inpatient Hospital Stay (HOSPITAL_COMMUNITY): Payer: BC Managed Care – PPO | Admitting: Speech Pathology

## 2012-07-14 ENCOUNTER — Inpatient Hospital Stay (HOSPITAL_COMMUNITY): Payer: BC Managed Care – PPO | Admitting: Occupational Therapy

## 2012-07-14 ENCOUNTER — Inpatient Hospital Stay (HOSPITAL_COMMUNITY): Payer: BC Managed Care – PPO | Admitting: *Deleted

## 2012-07-14 DIAGNOSIS — E119 Type 2 diabetes mellitus without complications: Secondary | ICD-10-CM

## 2012-07-14 DIAGNOSIS — I69991 Dysphagia following unspecified cerebrovascular disease: Secondary | ICD-10-CM

## 2012-07-14 DIAGNOSIS — G8110 Spastic hemiplegia affecting unspecified side: Secondary | ICD-10-CM

## 2012-07-14 LAB — PROTIME-INR
INR: 2.38 — ABNORMAL HIGH (ref 0.00–1.49)
Prothrombin Time: 24.9 seconds — ABNORMAL HIGH (ref 11.6–15.2)

## 2012-07-14 NOTE — Progress Notes (Signed)
Speech Language Pathology Daily Session Note  Patient Details  Name: Margaret Hess MRN: [id number] Date of Birth: [birth year]  Today's Date: 07/14/2012 Time: 1005-1035 Time Calculation (min): 30 min  Short Term Goals: Week 3: SLP Short Term Goal 1 (Week 3): Patient will verbally complete automatic sequences with supervision level assist phonemic cues. SLP Short Term Goal 2 (Week 3): Patient will verbally produce phrase level (3-4 word) utterances to describe situations, pictures, etc. with min assist verbal and written cues.  SLP Short Term Goal 3 (Week 3): Patient will self monitor accuracy of verbal expression with min assist verbal and non-verbal cues. SLP Short Term Goal 4 (Week 3): Patient will write by copying basic, biographical information with min assist multi-modal cues to self monitor and correct errors. SLP Short Term Goal 5 (Week 3): During non-structed verbal expression tasks patient with produce grammaticcaly correct phrases with max assist multi-modal cues.  Skilled Therapeutic Interventions: Treatment session focused on addressing questions and concerns regarding outing and SLP educated paiten regarding use of multi-modal communicaiton strategies during moments of anomia while in the community.  SLP also facilitated session with discussion regarding discharge planning and answered patient questions regarding equipment, follow up and need for assist.  SLP informed patient that social worker assisted with most of these concerns as time gets closer to discharge.  [name] verbalized understanding and desire to be in the loop regarding desicions.    Pain Pain Assessment Pain Assessment: No/denies pain  Therapy/Group: Individual Therapy  Charlane Ferretti., CCC-SLP [phone]  Quitman Norberto 07/14/2012, 11:00 AM

## 2012-07-14 NOTE — Progress Notes (Signed)
ANTICOAGULATION CONSULT NOTE - Follow Up Consult  Pharmacy Consult for Coumadin Indication: LV thrombus/ secondary stroke prophylaxis  No Known Allergies  Patient Measurements: Height: 5\' 6"  (167.6 cm) Weight: 172 lb 2.9 oz (78.1 kg) IBW/kg (Calculated) : 59.3   Vital Signs: Temp: 98.2 F (36.8 C) (11/12 0500) Temp src: Oral (11/12 0500) BP: 110/78 mmHg (11/12 0506) Pulse Rate: 107  (11/12 0506)  Labs:  Basename 07/14/12 0651 07/13/12 0640 07/12/12 0657  HGB -- -- --  HCT -- -- --  PLT -- -- --  APTT -- -- --  LABPROT 24.9* 24.3* 24.1*  INR 2.38* 2.30* 2.28*  HEPARINUNFRC -- -- --  CREATININE -- -- --  CKTOTAL -- -- --  CKMB -- -- --  TROPONINI -- -- --    Estimated Creatinine Clearance: 92.7 ml/min (by C-G formula based on Cr of 0.75).  Assessment: 45yof discharged from [facility] on 10/18 on coumadin 5mg  daily and lovenox 80mg  q12 for new LV thrombus. INR 2.15 at time of discharge after only 2 doses of coumadin. Returns to the hospital that same night with difficulty speaking and L sided weakness. 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 INR therapeutic today. If remains therapeutic tomorrow can change lab draws to MWF. Lower goal desired due to recent bleed. No cbc since 10.25, will check in am.  Goal of Therapy:  INR 2-2.5 (lower goal secondary to hemorrhagic transformation of CVA)   Plan:  Continue Coumadin 2.5mg  daily for maintenance. Continue daily PT/INR checks, cbc in am.  Verlene Mayer, PharmD, BCPS Pager [phone] 07/14/2012 9:25 AM

## 2012-07-14 NOTE — Progress Notes (Signed)
Recreational Therapy Session Note  Patient Details  Name: Margaret Hess MRN: [id number] Date of Birth: 1966/03/22 Today's Date: 07/14/2012 Time:  1030-1300 Pain: no c/o pain Skilled Therapeutic Interventions/Progress Updates: Pt participated in community reintegration/outing to [employer]. Session focused on community mobility (w/c mobility), identification and negotiation of obstacles, accessing public restrooms, expressive communication with peers & staff, and coping.  Pt stated some fear about going out in public, but reports" it wasn't as bad as what I had in my head."  Pt reported a very positive experience and was very thankful to have addressed fears of going out in public. See goal sheet in shadow chart for further details.  Therapy/Group: [employer]   Cheryln Balcom 07/14/2012, 4:13 PM

## 2012-07-14 NOTE — Progress Notes (Signed)
Speech Language Pathology Daily Session Note  Patient Details  Name: Margaret Hess MRN: [id number] Date of Birth: 04-16-1966  Today's Date: 07/14/2012 Time: 1350-1420 Time Calculation (min): 30 min  Short Term Goals: Week 3: SLP Short Term Goal 1 (Week 3): Patient will verbally complete automatic sequences with supervision level assist phonemic cues. SLP Short Term Goal 2 (Week 3): Patient will verbally produce phrase level (3-4 word) utterances to describe situations, pictures, etc. with min assist verbal and written cues.  SLP Short Term Goal 3 (Week 3): Patient will self monitor accuracy of verbal expression with min assist verbal and non-verbal cues. SLP Short Term Goal 4 (Week 3): Patient will write by copying basic, biographical information with min assist multi-modal cues to self monitor and correct errors. SLP Short Term Goal 5 (Week 3): During non-structed verbal expression tasks patient with produce grammaticcaly correct phrases with max assist multi-modal cues.  Skilled Therapeutic Interventions: Treatment session focused on addressing verbal expression and cognition.  SLP facilitated session with discussion regarding outing and mod assist verbal cues to utilize grammatically correct sentence structure to describe events in unstructured activity.  SLP also facilitated session with an alternating attention task that required patient to scan between to fields and compare and contrast objects to identify similar items cues were faded from mod assist to supervision level after SLP educated regarding use of compensatory strategies.  Patient verbally expressed frustration with slowed thinking/processing which demonstrated increased awareness of deficits.      FIM:  Comprehension Comprehension Mode: Auditory Comprehension: 5-Understands basic 90% of the time/requires cueing < 10% of the time Expression Expression Mode: Verbal Expression: 4-Expresses basic 75 - 89% of the time/requires  cueing 10 - 24% of the time. Needs helper to occlude trach/needs to repeat words. Social Interaction Social Interaction: 5-Interacts appropriately 90% of the time - Needs monitoring or encouragement for participation or interaction. Problem Solving Problem Solving: 5-Solves basic 90% of the time/requires cueing < 10% of the time Memory Memory: 5-Recognizes or recalls 90% of the time/requires cueing < 10% of the time FIM - Eating Eating Activity: 5: Set-up assist for open containers  Pain Pain Assessment Pain Assessment: No/denies pain  Therapy/Group: Individual Therapy  Charlane Ferretti., CCC-SLP [phone]  Margaret Hess 07/14/2012, 4:12 PM

## 2012-07-14 NOTE — Plan of Care (Signed)
Problem: RH BLADDER ELIMINATION Goal: RH STG MANAGE BLADDER WITH ASSISTANCE STG Manage Bladder With mod independence (continent bladder and able to call for assistance appropriately)  Outcome: Progressing No incontinent episode reported

## 2012-07-14 NOTE — Progress Notes (Signed)
Physical Therapy Session Note  Patient Details  Name: Margaret Hess MRN: [id number] Date of Birth: 04-18-1966  Today's Date: 07/14/2012 Time: 1030-1300 Time Calculation (min): 150 min   Skilled Therapeutic Interventions/Progress Updates:  Pain: No c/o pain Co- therapy treatment with recreational therapist and one other pt present. Pt participated in community outing experience to [employer]. Pt met all PT/OT therapy goals, see goal sheet in shadow chart for details. Addressed community wheelchair mobility (level surfaces only), accessibility, public bathroom negotiation, safety in the community, addressing struggles of expressive aphasia when in community, and psychosocial aspects of going out in public.  Wheelchair squat pivot transfers with min assist to/from chair in [place] (x 2 reps), public toilet, and recliner during outing. Pt able to verbalize need to lock brakes however needed safe sequencing cues and cues for staying "slow and low" to avoid impulsive movements and Lt. Lean.  By end of outing pt reported a very positive experience and was very thankful to have addressed fears of going out in public.  Therapy Documentation Precautions:  Precautions Precautions: Fall Precaution Comments: aphasic; LLE flexor withdrawal at times Restrictions Weight Bearing Restrictions: No Pain: Pain Assessment Pain Assessment: No/denies pain  See FIM for current functional status  Therapy/Group: Co-Treatment  Wilhemina Bonito 07/14/2012, 5:26 PM

## 2012-07-14 NOTE — Progress Notes (Signed)
Occupational Therapy Session Note  Patient Details  Name: Margaret Hess MRN: [id number] Date of Birth: Apr 03, 1966  Today's Date: 07/14/2012 Time: 0805-0905 Time Calculation (min): 60 min  Short Term Goals: Week 1:  OT Short Term Goal 1 (Week 1): Pt will maintain dynamic sitting balance with mod assist during selfcare activities. OT Short Term Goal 1 - Progress (Week 1): Met OT Short Term Goal 2 (Week 1): Pt will position the LUE prior and after transitional movements with mod questioning cues. OT Short Term Goal 2 - Progress (Week 1): Met OT Short Term Goal 3 (Week 1): Pt will perform all bathing with mod assist sit to stand. OT Short Term Goal 3 - Progress (Week 1): Met OT Short Term Goal 4 (Week 1): Pt will perform UB dressing with min assist to donn pullover shirt. OT Short Term Goal 4 - Progress (Week 1): Met OT Short Term Goal 5 (Week 1): Pt will perform toilet transfers with mod assist to 3:1. Week 2:  OT Short Term Goal 1 (Week 2): Pt will be able to stand at sink with min assist to allow her to pull up her pants with min assist. OT Short Term Goal 1 - Progress (Week 2): Met OT Short Term Goal 2 (Week 2): Pt will bathe in shower with steady assist using long sponge. OT Short Term Goal 2 - Progress (Week 2): Partly met OT Short Term Goal 3 (Week 2): Pt will perform self ROM to LUE with min verbal cues. OT Short Term Goal 3 - Progress (Week 2): Met OT Short Term Goal 4 (Week 2): Pt will transfer to toilet with min assist. OT Short Term Goal 4 - Progress (Week 2): Met Week 3:  OT Short Term Goal 1 (Week 3): Pt will be able to transfer to toilet with steady assist. OT Short Term Goal 1 - Progress (Week 3): Progressing toward goal OT Short Term Goal 2 (Week 3): Pt will be able to bathe with steady assist. OT Short Term Goal 2 - Progress (Week 3): Progressing toward goal OT Short Term Goal 3 (Week 3): Pt will be able to pull pants over hips with steady assist. OT Short Term Goal 3  - Progress (Week 3): Progressing toward goal  Skilled Therapeutic Interventions/Progress Updates:      Pt seen for BADL retraining of toileting, bathing, and dressing with a focus on safety awareness with transfers, increasing independence with squat pivot transfers, hemi dressing techniques, standing and weight bearing tasks.  Pt stood up by herself and started to transfer prior to this clinician standing next to her and locking w/c brakes.  Pt was shocked and surprised that she did that but was fully aware that was not safe for her to do. Today patient demonstrated much improved standing balance in that she was able to stand with steady assist while she managed clothing over hips with steady assist. Patient was excited about this progress.  Therapy Documentation Precautions:  Precautions Precautions: Fall Precaution Comments: aphasic; LLE flexor withdrawal at times Restrictions Weight Bearing Restrictions: No  Pain: Pain Assessment Pain Assessment: No/denies pain ADL:  See FIM for current functional status  Therapy/Group: Individual Therapy  Margaret Hess 07/14/2012, 11:25 AM

## 2012-07-14 NOTE — Progress Notes (Signed)
Patient ID: Margaret Hess, female   DOB: 02-Apr-1966, 46 y.o.   MRN: [id number]  Subjective/Complaints: Continues with expressive aphasia Reviewed BPs Review of Systems  Unable to perform ROS: medical condition   Objective: Vital Signs: Blood pressure 110/78, pulse 107, temperature 98.2 F (36.8 C), temperature source Oral, resp. rate 19, height 5\' 6"  (1.676 m), weight 78.1 kg (172 lb 2.9 oz), SpO2 100.00%. No results found. Results for orders placed during the hospital encounter of 06/25/12 (from the past 72 hour(s))  GLUCOSE, CAPILLARY     Status: Abnormal   Collection Time   07/11/12 11:34 AM      Component Value Range Comment   Glucose-Capillary 108 (*) 70 - 99 mg/dL    Comment 1 Notify RN     GLUCOSE, CAPILLARY     Status: Abnormal   Collection Time   07/11/12  4:27 PM      Component Value Range Comment   Glucose-Capillary 163 (*) 70 - 99 mg/dL    Comment 1 Notify RN     GLUCOSE, CAPILLARY     Status: Abnormal   Collection Time   07/11/12  8:52 PM      Component Value Range Comment   Glucose-Capillary 110 (*) 70 - 99 mg/dL    Comment 1 Notify RN     PROTIME-INR     Status: Abnormal   Collection Time   07/12/12  6:57 AM      Component Value Range Comment   Prothrombin Time 24.1 (*) 11.6 - 15.2 seconds    INR 2.28 (*) 0.00 - 1.49   GLUCOSE, CAPILLARY     Status: Abnormal   Collection Time   07/12/12  7:46 AM      Component Value Range Comment   Glucose-Capillary 137 (*) 70 - 99 mg/dL    Comment 1 Notify RN     GLUCOSE, CAPILLARY     Status: Abnormal   Collection Time   07/12/12 11:42 AM      Component Value Range Comment   Glucose-Capillary 150 (*) 70 - 99 mg/dL    Comment 1 Notify RN     GLUCOSE, CAPILLARY     Status: Abnormal   Collection Time   07/12/12  4:32 PM      Component Value Range Comment   Glucose-Capillary 139 (*) 70 - 99 mg/dL    Comment 1 Notify RN     GLUCOSE, CAPILLARY     Status: Abnormal   Collection Time   07/12/12  8:58 PM      Component  Value Range Comment   Glucose-Capillary 126 (*) 70 - 99 mg/dL    Comment 1 Notify RN     PROTIME-INR     Status: Abnormal   Collection Time   07/13/12  6:40 AM      Component Value Range Comment   Prothrombin Time 24.3 (*) 11.6 - 15.2 seconds    INR 2.30 (*) 0.00 - 1.49   GLUCOSE, CAPILLARY     Status: Abnormal   Collection Time   07/13/12  7:51 AM      Component Value Range Comment   Glucose-Capillary 163 (*) 70 - 99 mg/dL   GLUCOSE, CAPILLARY     Status: Abnormal   Collection Time   07/13/12 11:35 AM      Component Value Range Comment   Glucose-Capillary 166 (*) 70 - 99 mg/dL    Comment 1 Notify RN     GLUCOSE, CAPILLARY     Status:  Abnormal   Collection Time   07/13/12  4:45 PM      Component Value Range Comment   Glucose-Capillary 157 (*) 70 - 99 mg/dL    Comment 1 Notify RN     GLUCOSE, CAPILLARY     Status: Abnormal   Collection Time   07/13/12  8:52 PM      Component Value Range Comment   Glucose-Capillary 126 (*) 70 - 99 mg/dL    Comment 1 Notify RN     PROTIME-INR     Status: Abnormal   Collection Time   07/14/12  6:51 AM      Component Value Range Comment   Prothrombin Time 24.9 (*) 11.6 - 15.2 seconds    INR 2.38 (*) 0.00 - 1.49   GLUCOSE, CAPILLARY     Status: Abnormal   Collection Time   07/14/12  7:23 AM      Component Value Range Comment   Glucose-Capillary 136 (*) 70 - 99 mg/dL    Comment 1 Notify RN        HEENT: normal Cardio: RRR Resp: CTA B/L GI: BS positive Extremity:  No Edema Skin:   Intact Neuro: Abnormal Sensory absent light touch and proprio, positive deep pain, Abnormal Motor 0/5 in LUE and LLE, Abnormal FMC Tone  reduced on L side, Tone:  Hypotonia, Aphasic, Inattention and Apraxic Musc/Skel:  Swelling L dorsum hand ecchymosis and swelling-mild Increased L hamstrings and hip adductor tone--unchanged  Assessment/Plan: 1. Functional deficits secondary to R MCA infarct L HP, crossed aphasia,apraxia  which require 3+ hours per day of  interdisciplinary therapy in a comprehensive inpatient rehab setting. Physiatrist is providing close team supervision and 24 hour management of active medical problems listed below. Physiatrist and rehab team continue to assess barriers to discharge/monitor patient progress toward functional and medical goals. FIM: FIM - Bathing Bathing Steps Patient Completed: Chest;Right Arm;Left Arm;Abdomen;Front perineal area;Buttocks;Right lower leg (including foot);Left upper leg;Right upper leg;Left lower leg (including foot) Bathing: 4: Steadying assist  FIM - Upper Body Dressing/Undressing Upper body dressing/undressing steps patient completed: Thread/unthread left sleeve of pullover shirt/dress;Put head through opening of pull over shirt/dress;Pull shirt over trunk;Thread/unthread right sleeve of pullover shirt/dresss Upper body dressing/undressing: 5: Supervision: Safety issues/verbal cues FIM - Lower Body Dressing/Undressing Lower body dressing/undressing steps patient completed: Thread/unthread right underwear leg;Thread/unthread left underwear leg;Thread/unthread right pants leg;Thread/unthread left pants leg;Don/Doff right shoe Lower body dressing/undressing: 3: Mod-Patient completed 50-74% of tasks  FIM - Toileting Toileting steps completed by patient: Adjust clothing prior to toileting;Performs perineal hygiene Toileting Assistive Devices: Grab bar or rail for support Toileting: 3: Mod-Patient completed 2 of 3 steps  FIM - Diplomatic Services operational officer Devices: Grab bars Toilet Transfers: 3-To toilet/BSC: Mod A (lift or lower assist)  FIM - Banker Devices: Arm rests Bed/Chair Transfer: 4: Supine > Sit: Min A (steadying Pt. > 75%/lift 1 leg);4: Sit > Supine: Min A (steadying pt. > 75%/lift 1 leg);4: Bed > Chair or W/C: Min A (steadying Pt. > 75%);4: Chair or W/C > Bed: Min A (steadying Pt. > 75%)  FIM - Locomotion:  Wheelchair Locomotion: Wheelchair: 2: Travels 50 - 149 ft with minimal assistance (Pt.>75%) FIM - Locomotion: Ambulation Locomotion: Ambulation Assistive Devices: Programmer, systems - Platform Ambulation/Gait Assistance: 3: Mod assist Locomotion: Ambulation: 1: Travels less than 50 ft with moderate assistance (Pt: 50 - 74%)  Comprehension Comprehension Mode: Auditory Comprehension: 4-Understands basic 75 - 89% of the time/requires cueing 10 -  24% of the time  Expression Expression Mode: Verbal Expression: 4-Expresses basic 75 - 89% of the time/requires cueing 10 - 24% of the time. Needs helper to occlude trach/needs to repeat words.  Social Interaction Social Interaction Mode: Asleep Social Interaction: 5-Interacts appropriately 90% of the time - Needs monitoring or encouragement for participation or interaction.  Problem Solving Problem Solving Mode: Asleep Problem Solving: 4-Solves basic 75 - 89% of the time/requires cueing 10 - 24% of the time  Memory Memory Mode: Asleep Memory: 4-Recognizes or recalls 75 - 89% of the time/requires cueing 10 - 24% of the time  Medical Problem List and Plan:  1. 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 DVT Prophylaxis/Anticoagulation: Chronic Coumadin therapy with a goal INR of 2-2.5 secondary hemorrhagic transformation  3. Neuropsych: This patient is not capable of making decisions on his/her own behalf.  4. Hypertension/nonischemic cardiomyopathy with grade 3 diastolic dysfunction.Reduce  Lisinopril to  2.5 mg twice a day, Lanoxin 0.125 mg daily. Monitor with increased mobility. Cardiology to follow up as needed(Dr. Bensimhon), TED hose when OOB Discussed with cardiology today no changes 5. Diabetes mellitus. Patient currently on sliding scale insulin. Patient on Glucophage 500 mg twice a day prior to admission and victoza 1.2 mL subcutaneously daily. Check blood sugars a.c. and at  bedtime, Is on hi dose metformin 1000mg  BID with controlled CBGs.  If this fails then restart Victoza--improved control at present 6. Mood/depression. Patient on Wellbutrin prior to admission 75 mg daily. We'll discuss resuming this medication. Provide emotional support and positive reinforcement  7. 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  Back pain mild/mod will use K pad and analgesic balm  -discussed Vonna Kotyk back cushion with PT 9.  Spasticity  started Zanaflex monitor sedation, ok thus far  LOS (Days) 19 A FACE TO FACE EVALUATION WAS PERFORMED  KIRSTEINS,ANDREW E 07/14/2012, 9:40 AM

## 2012-07-14 NOTE — Consult Note (Signed)
  07/13/12  PSYCHOSOCIAL NOTE - CONFIDENTIAL  [facility]   Mrs. Margaret Hess was seen on the [facility] for follow-up care regarding her stroke diagnosis. She was seen to provide her with education and coping strategies, as well as to provide ego support.    Subjective: Mrs. Margaret Hess described her current mood as "up." However, she continues to acknowledge experiencing "good and bad moments." She described her thoughts as being slow and scattered. All expressive speech has reportedly been affected. On a scale of 1-10, she said her pain was a 9. Mrs. Margaret Hess purportedly has not been sleeping well but and is working with her providers to improve this symptom. Of note, the patient experienced a fall recently that she felt was due to one particular nurse's incompetence. Mrs. Margaret Hess said she addressed the issue with the nursing supervisor and the issue is now resolved.   Objective: Mrs. Margaret Hess seemed relaxed and her mood was euthymic. She was friendly and rapport was easily established.  Given her pain, a nurse entered the room during this visit to administer a pain medication.  Assessment: Mrs. Margaret Hess seems to suffer from expressive aphasia similar to a Broca's aphasia, despite the fact that her stroke took place in the right hemisphere of the brain. However, there are a percentage of people who have language located within the right hemisphere even though ~97% of right-handers are left hemisphere language dominant.   Beck Depression Inventory-FastScreen (for medical patients) = 7 (suggests mild-to-moderate depression)  Plan:    Continue to provide education   Continue to provide ego support and supportive psychotherapy   Refer for neuropsychological testing upon discharge.   Greater than 50% of this visit was spent educating the patient about the possible diagnosis, prognosis, management plan, and in coordination of care.   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   Debbe Mounts, Psy.D.  Clinical Neuropsychologist

## 2012-07-14 NOTE — Plan of Care (Signed)
Problem: RH BOWEL ELIMINATION Goal: RH STG MANAGE BOWEL WITH ASSISTANCE STG Manage Bowel with mod independence (continent and only prn medications)  Outcome: Progressing No incontinent episode reported

## 2012-07-15 ENCOUNTER — Inpatient Hospital Stay (HOSPITAL_COMMUNITY): Payer: BC Managed Care – PPO

## 2012-07-15 ENCOUNTER — Inpatient Hospital Stay (HOSPITAL_COMMUNITY): Payer: BC Managed Care – PPO | Admitting: Occupational Therapy

## 2012-07-15 ENCOUNTER — Inpatient Hospital Stay (HOSPITAL_COMMUNITY): Payer: BC Managed Care – PPO | Admitting: *Deleted

## 2012-07-15 ENCOUNTER — Inpatient Hospital Stay (HOSPITAL_COMMUNITY): Payer: BC Managed Care – PPO | Admitting: Speech Pathology

## 2012-07-15 LAB — GLUCOSE, CAPILLARY
Glucose-Capillary: 152 mg/dL — ABNORMAL HIGH (ref 70–99)
Glucose-Capillary: 231 mg/dL — ABNORMAL HIGH (ref 70–99)
Glucose-Capillary: 136 mg/dL — ABNORMAL HIGH (ref 70–99)

## 2012-07-15 LAB — BASIC METABOLIC PANEL
CO2: 23 mEq/L (ref 19–32)
Chloride: 95 mEq/L — ABNORMAL LOW (ref 96–112)
GFR calc non Af Amer: >90 mL/min (ref >90)
Glucose, Bld: 253 mg/dL — ABNORMAL HIGH (ref 70–99)
Potassium: 4.2 mEq/L (ref 3.5–5.1)
Sodium: 135 mEq/L (ref 135–145)

## 2012-07-15 LAB — PROTIME-INR
INR: 2.2 — ABNORMAL HIGH (ref 0.00–1.49)
Prothrombin Time: 23.5 seconds — ABNORMAL HIGH (ref 11.6–15.2)

## 2012-07-15 LAB — CBC
Hemoglobin: 11.3 g/dL — ABNORMAL LOW (ref 12.0–15.0)
RBC: 3.84 MIL/uL — ABNORMAL LOW (ref 3.87–5.11)
WBC: 9.9 10*3/uL (ref 4.0–10.5)

## 2012-07-15 NOTE — Progress Notes (Signed)
Recreational Therapy Session Note  Patient Details  Name: Margaret Hess MRN: [id number] Date of Birth: 04/11/1966 Today's Date: 07/15/2012 Time:  1300-1345 Pain: no c/o Skilled Therapeutic Interventions/Progress Updates: pt seated in w/c upon entry to room.  Pt stated that she had had a rough day and was not able to participate in planned therapies as scheduled.  Session focused on reviewing community reintegration/outing yesterday.  Pt stated again today that it was a good experience and helped prepare her for going out after discharge.  Pt identified barriers to her independence during community pursuits both physical and for self expression and also verbalized strategies to help reduce her frustration with these tasks in the future given extra time and min cues.  At end of session, pt reported feeling bad, "a little dizzy" when questioned & requested going back to bed- squat pivot transfer w/c-->bed with min assist.  RN made aware given BP issue previously today.  RN at bed side and addressing complaints. Therapy/Group: Individual Therapy Tarin Navarez 07/15/2012, 2:58 PM

## 2012-07-15 NOTE — Progress Notes (Signed)
Social Work Patient ID: Margaret Hess, female   DOB: 1965-11-12, 46 y.o.   MRN: [id number] Spoke with sister who reports she will not be pt's caregiver, she thought pt's insurance paid for private duty caregivers. Informed her I thought she was coming back to stay with pt for a month and then decide from there what they would do.  Informed her Insurance may cover NHP but up to [facility] and no guarantee.  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 She reports it is not handicapped accessible nor could be made that way.  Unsure when discussing discharge with sister what she heard.  Sister to make some  Phone calls and get back with worker.  I will touch base with pt so she is on the same page.  Come up with a safe discharge plan.

## 2012-07-15 NOTE — Patient Care Conference (Signed)
Inpatient RehabilitationTeam Conference Note Date: 07/15/2012   Time: 11:15 AM    Patient Name: Margaret Hess      Medical Record Number: [id number]  Date of Birth: 1966/04/17 Sex: Female         Room/Bed: 4033/4033-01 Payor Info: Payor: BLUE CROSS BLUE SHIELD  Plan: [facility] HE[facility] PPO  Product Type: *No Product type*     Admitting Diagnosis: R BKA  Admit Date/Time:  06/25/2012  3:56 PM Admission Comments: No comment available   Primary Diagnosis:  CVA (cerebral infarction) Principal Problem: CVA (cerebral infarction)  Patient Active Problem List   Diagnosis Date Noted  . CVA (cerebral infarction) 06/26/2012  . Chronic systolic heart failure 06/23/2012  . Cardiomyopathy 06/21/2012  . Stroke, embolic 06/20/2012  . Nonischemic cardiomyopathy 06/17/2012  . LV (left ventricular) mural thrombus without MI 06/16/2012  . Chronic passive hepatic congestion 06/16/2012  . Acute respiratory failure with hypoxia 06/16/2012  . Positive D dimer 06/16/2012  . Abnormal coagulation profile 06/16/2012  . Diabetes mellitus 06/16/2012  . Leukocytosis 06/16/2012  . Pulmonary hypertension 06/16/2012  . Acute diastolic heart failure, NYHA class 3 06/16/2012  . Acute combined systolic and diastolic heart failure 06/16/2012  . Acute systolic CHF (congestive heart failure), NYHA class 4 06/15/2012  . HTN (hypertension) 06/15/2012  . Anxiety 06/15/2012  . Moderate Bilateral pleural effusion 06/15/2012  . Dyspnea on exertion 06/15/2012    Expected Discharge Date: Expected Discharge Date: 07/29/12  Team Members Present: Physician: Dr. Claudette Laws Social Worker Present: Dossie Der, LCSW Nurse Present: Other (comment) Charisse March Hicks-RN) PT Present: Edman Circle, Lillie Columbia, PT OT Present: Bretta Bang, Verlene Mayer, OT SLP Present: Fae Pippin, SLP     Current Status/Progress Goal Weekly Team Focus  Medical   tone reduced but not tolerating zanaflex  normallize tone  med  adjustment   Bowel/Bladder   Continent of bowel and baldder. LBM 07/12/12 per report  Continent of bowel and bladder  Continue with timed toileting q 2hrs   Swallow/Nutrition/ Hydration   regular textures, thin liquids   least restrictive p.o. intake  Mod I utilization of compensatory strategies for precautions    ADL's   supervision with shirt, min with LB dressing, steady assist bathing and toileting, min toilet transfers and static standing - good progress  min bathing, LB dressing, toileting, transfers; supervision UB dressing - goals just reached 11-12, will not upgrade yet but will work towards increased consistency with less verbal cues    ADL retraining, neuro re-ed, functional mobility, family education   Mobility   min assist squat pivot transfers; mod assist stand pivot; mod assist gait x 28' wearing LAFO with L PFRW;  superviison w/c x 30-100   min assist transfers; supervision w/c mobility x 150', mod assist gait x 50' and up/down 4 steps   transfers, w/c propulsion, LLE neuro re-ed, gait   Communication   Mod A- Min A  Min A  verbal expression of wants and needs, grammatically complete 8-10 word utterances, accurate decoding of written text    Safety/Cognition/ Behavioral Observations  Min A- supervision  supervision   increased self monitoring and correcting of errors during verbal expressin    Pain   Scheduled neurontin 600mg  and tylenol 650 q4hrs PRN-pain to mid lower back  less than 3   monitor pain and medicate as needed and prior to therapy   Skin   Bruising to abdomen and L arm  No additional skin breakdown  Monitor      *  See Interdisciplinary Assessment and Plan and progress notes for long and short-term goals  Barriers to Discharge: aphasia, 24/7 care    Possible Resolutions to Barriers:  SW to communicate with sister    Discharge Planning/Teaching Needs:  Home with her sister providing the majority of care and possibly hiring assistance.  Sister coming back  next Monday      Team Discussion:  Progressing well, may revise some goals to higher level.  Family education to begin with sister when returns.  BP issues this am d/c zanaflex.  Possible home eval prior to discharge.  Concern due to lack of carryover in therpaies  Revisions to Treatment Plan:  Extended discharge due to good progress   Continued Need for Acute Rehabilitation Level of Care: The patient requires daily medical management by a physician with specialized training in physical medicine and rehabilitation for the following conditions: Daily direction of a multidisciplinary physical rehabilitation program to ensure safe treatment while eliciting the highest outcome that is of practical value to the patient.: Yes Daily medical management of patient stability for increased activity during participation in an intensive rehabilitation regime.: Yes Daily analysis of laboratory values and/or radiology reports with any subsequent need for medication adjustment of medical intervention for : Neurological problems  Lucy Chris 07/15/2012, 3:54 PM

## 2012-07-15 NOTE — Progress Notes (Signed)
At 0935 speech therapy called nursing into room. Pt stated she felt "dizzy, weak, and seeing spots." Pt had pale skin tone. Pt was put into bed by staff and VS checked. BP 88/55 HR 62 at 0940. Notified Harvel Ricks, PA at that time Dr. Jodean Lima arrived to room to assess pt. New orders were made regarding d/c zanaflex. Orders were to continue to monitor pt. At 1020 BP 99/63 HR 68. Pt drowsy but arousable.  In bed resting with feet elevated with call bell in reach and bed alarm on.  Cont. To monitor pt.

## 2012-07-15 NOTE — Progress Notes (Signed)
Patient ID: Margaret Hess, female   DOB: 28-Mar-1966, 46 y.o.   MRN: [id number]  Subjective/Complaints: C/o dizziness in SLP Reviewed BPs Review of Systems  Unable to perform ROS: medical condition   Objective: Vital Signs: Blood pressure 88/55, pulse 62, temperature 99 F (37.2 C), temperature source Oral, resp. rate 19, height 5\' 6"  (1.676 m), weight 79.5 kg (175 lb 4.3 oz), SpO2 96.00%. No results found. Results for orders placed during the hospital encounter of 06/25/12 (from the past 72 hour(s))  GLUCOSE, CAPILLARY     Status: Abnormal   Collection Time   07/12/12 11:42 AM      Component Value Range Comment   Glucose-Capillary 150 (*) 70 - 99 mg/dL    Comment 1 Notify RN     GLUCOSE, CAPILLARY     Status: Abnormal   Collection Time   07/12/12  4:32 PM      Component Value Range Comment   Glucose-Capillary 139 (*) 70 - 99 mg/dL    Comment 1 Notify RN     GLUCOSE, CAPILLARY     Status: Abnormal   Collection Time   07/12/12  8:58 PM      Component Value Range Comment   Glucose-Capillary 126 (*) 70 - 99 mg/dL    Comment 1 Notify RN     PROTIME-INR     Status: Abnormal   Collection Time   07/13/12  6:40 AM      Component Value Range Comment   Prothrombin Time 24.3 (*) 11.6 - 15.2 seconds    INR 2.30 (*) 0.00 - 1.49   GLUCOSE, CAPILLARY     Status: Abnormal   Collection Time   07/13/12  7:51 AM      Component Value Range Comment   Glucose-Capillary 163 (*) 70 - 99 mg/dL   GLUCOSE, CAPILLARY     Status: Abnormal   Collection Time   07/13/12 11:35 AM      Component Value Range Comment   Glucose-Capillary 166 (*) 70 - 99 mg/dL    Comment 1 Notify RN     GLUCOSE, CAPILLARY     Status: Abnormal   Collection Time   07/13/12  4:45 PM      Component Value Range Comment   Glucose-Capillary 157 (*) 70 - 99 mg/dL    Comment 1 Notify RN     GLUCOSE, CAPILLARY     Status: Abnormal   Collection Time   07/13/12  8:52 PM      Component Value Range Comment   Glucose-Capillary  126 (*) 70 - 99 mg/dL    Comment 1 Notify RN     PROTIME-INR     Status: Abnormal   Collection Time   07/14/12  6:51 AM      Component Value Range Comment   Prothrombin Time 24.9 (*) 11.6 - 15.2 seconds    INR 2.38 (*) 0.00 - 1.49   GLUCOSE, CAPILLARY     Status: Abnormal   Collection Time   07/14/12  7:23 AM      Component Value Range Comment   Glucose-Capillary 136 (*) 70 - 99 mg/dL    Comment 1 Notify RN     GLUCOSE, CAPILLARY     Status: Abnormal   Collection Time   07/14/12  4:35 PM      Component Value Range Comment   Glucose-Capillary 118 (*) 70 - 99 mg/dL   GLUCOSE, CAPILLARY     Status: Abnormal   Collection Time   07/14/12  9:00 PM      Component Value Range Comment   Glucose-Capillary 118 (*) 70 - 99 mg/dL   PROTIME-INR     Status: Abnormal   Collection Time   07/15/12  6:10 AM      Component Value Range Comment   Prothrombin Time 23.5 (*) 11.6 - 15.2 seconds    INR 2.20 (*) 0.00 - 1.49   CBC     Status: Abnormal   Collection Time   07/15/12  6:10 AM      Component Value Range Comment   WBC 9.9  4.0 - 10.5 K/uL    RBC 3.84 (*) 3.87 - 5.11 MIL/uL    Hemoglobin 11.3 (*) 12.0 - 15.0 g/dL    HCT 40.9 (*) 81.1 - 46.0 %    MCV 86.2  78.0 - 100.0 fL    MCH 29.4  26.0 - 34.0 pg    MCHC 34.1  30.0 - 36.0 g/dL    RDW 91.4  78.2 - 95.6 %    Platelets 290  150 - 400 K/uL   GLUCOSE, CAPILLARY     Status: Abnormal   Collection Time   07/15/12  7:57 AM      Component Value Range Comment   Glucose-Capillary 152 (*) 70 - 99 mg/dL    Comment 1 Documented in Chart      Comment 2 Notify RN        HEENT: normal Cardio: RRR Resp: CTA B/L GI: BS positive Extremity:  No Edema Skin:   Intact Neuro: Abnormal Sensory absent light touch and proprio, positive deep pain, Abnormal Motor 0/5 in LUE and LLE, Abnormal FMC Tone  reduced on L side, Tone:  Hypotonia, Aphasic, Inattention and Apraxic Musc/Skel:  Swelling L dorsum hand ecchymosis and swelling-mild Increased L  hamstrings and hip adductor tone--unchanged  Assessment/Plan: 1. Functional deficits secondary to R MCA infarct L HP, crossed aphasia,apraxia  which require 3+ hours per day of interdisciplinary therapy in a comprehensive inpatient rehab setting. Physiatrist is providing close team supervision and 24 hour management of active medical problems listed below. Physiatrist and rehab team continue to assess barriers to discharge/monitor patient progress toward functional and medical goals. FIM: FIM - Bathing Bathing Steps Patient Completed: Chest;Right Arm;Left Arm;Abdomen;Front perineal area;Buttocks;Right lower leg (including foot);Left upper leg;Right upper leg;Left lower leg (including foot) Bathing: 4: Steadying assist  FIM - Upper Body Dressing/Undressing Upper body dressing/undressing steps patient completed: Thread/unthread left sleeve of pullover shirt/dress;Put head through opening of pull over shirt/dress;Pull shirt over trunk;Thread/unthread right sleeve of pullover shirt/dresss Upper body dressing/undressing: 5: Supervision: Safety issues/verbal cues FIM - Lower Body Dressing/Undressing Lower body dressing/undressing steps patient completed: Thread/unthread right underwear leg;Thread/unthread left underwear leg;Pull underwear up/down;Thread/unthread right pants leg;Thread/unthread left pants leg;Pull pants up/down;Don/Doff right shoe Lower body dressing/undressing: 4: Steadying Assist  FIM - Toileting Toileting steps completed by patient: Adjust clothing prior to toileting;Performs perineal hygiene Toileting Assistive Devices: Grab bar or rail for support Toileting: 3: Mod-Patient completed 2 of 3 steps (in public restroom)  FIM - Diplomatic Services operational officer Devices: Elevated toilet seat;Grab bars Toilet Transfers: 4-To toilet/BSC: Min A (steadying Pt. > 75%);4-From toilet/BSC: Min A (steadying Pt. > 75%)  FIM - Bed/Chair Transfer Bed/Chair Transfer Assistive  Devices: Arm rests Bed/Chair Transfer: 4: Supine > Sit: Min A (steadying Pt. > 75%/lift 1 leg);4: Sit > Supine: Min A (steadying pt. > 75%/lift 1 leg);4: Bed > Chair or W/C: Min A (steadying Pt. > 75%);4: Chair or W/C >  Bed: Min A (steadying Pt. > 75%)  FIM - Locomotion: Wheelchair Locomotion: Wheelchair: 1: Travels less than 50 ft with minimal assistance (Pt.>75%) FIM - Locomotion: Ambulation Locomotion: Ambulation Assistive Devices: Programmer, systems - Platform Ambulation/Gait Assistance: 3: Mod assist Locomotion: Ambulation: 0: Activity did not occur  Comprehension Comprehension Mode: Auditory Comprehension: 5-Understands basic 90% of the time/requires cueing < 10% of the time  Expression Expression Mode: Verbal Expression: 4-Expresses basic 75 - 89% of the time/requires cueing 10 - 24% of the time. Needs helper to occlude trach/needs to repeat words.  Social Interaction Social Interaction Mode: Asleep Social Interaction: 5-Interacts appropriately 90% of the time - Needs monitoring or encouragement for participation or interaction.  Problem Solving Problem Solving Mode: Asleep Problem Solving: 5-Solves basic 90% of the time/requires cueing < 10% of the time  Memory Memory Mode: Asleep Memory: 5-Recognizes or recalls 90% of the time/requires cueing < 10% of the time  Medical Problem List and Plan:  1. 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 DVT Prophylaxis/Anticoagulation: Chronic Coumadin therapy with a goal INR of 2-2.5 secondary hemorrhagic transformation  3. Neuropsych: This patient is not capable of making decisions on his/her own behalf.  4. Hypertension/nonischemic cardiomyopathy with grade 3 diastolic dysfunction.Reduce  Lisinopril to  2.5 mg twice a day, Lanoxin 0.125 mg daily. Monitor with increased mobility. Cardiology to follow up as needed(Dr. Bensimhon), TED hose when OOB Discussed with cardiology today  no changes 5. Diabetes mellitus. Patient currently on sliding scale insulin. Patient on Glucophage 500 mg twice a day prior to admission and victoza 1.2 mL subcutaneously daily. Check blood sugars a.c. and at bedtime, Is on hi dose metformin 1000mg  BID with controlled CBGs.  If this fails then restart Victoza--improved control at present 6. Mood/depression. Patient on Wellbutrin prior to admission 75 mg daily. We'll discuss resuming this medication. Provide emotional support and positive reinforcement  7. 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  Back pain mild/mod will use K pad and analgesic balm  -discussed Vonna Kotyk back cushion with PT 9.  Spasticity  started Zanaflex probable cause of orthostasis will d/c, CBC ok await bmet   LOS (Days) 20 A FACE TO FACE EVALUATION WAS PERFORMED  Karrie Fluellen E 07/15/2012, 9:44 AM

## 2012-07-15 NOTE — Progress Notes (Signed)
Physical Therapy Session Note  Patient Details  Name: Margaret Hess MRN: [id number] Date of Birth: Sep 10, 1965  Today's Date: 07/15/2012 Time:  -     Short Term Goals: Week 3:  PT Short Term Goal 1 (Week 3): Pt will roll R with 1 visual cue. PT Short Term Goal 2 (Week 3): Pt will transfer bed>< w/c with supervision. PT Short Term Goal 3 (Week 3): Pt will stand x 5 minutes during functional task with mod assist for balance. PT Short Term Goal 4 (Week 3): Pt will perform gait x 25' with LRAD and assist of 1 person.  Skilled Therapeutic Interventions/Progress Updates:  Pt experienced hypotension/dizziness x 2 today during other therapies.  She declined therapy, stating she was very tired, and needed to stay in bed and rest.     Therapy Documentation Precautions:  Precautions Precautions: Fall Precaution Comments: aphasic; LLE flexor withdrawal at times Restrictions Weight Bearing Restrictions: No General: Amount of Missed PT Time (min): 45 Minutes Missed Time Reason: Patient ill (comment)         See FIM for current functional status  Therapy/Group: Individual Therapy  Josefina Rynders 07/15/2012, 3:15 PM

## 2012-07-15 NOTE — Progress Notes (Signed)
Speech Language Pathology Weekly Progress Note  Patient Details  Name: Margaret Hess MRN: [id number] Date of Birth: [birth year]  Today's Date: 07/15/2012  Short Term Goals: Week 3: SLP Short Term Goal 1 (Week 3): Patient will verbally complete automatic sequences with supervision level assist phonemic cues. SLP Short Term Goal 1 - Progress (Week 3): Progressing toward goal SLP Short Term Goal 2 (Week 3): Patient will verbally produce phrase level (3-4 word) utterances to describe situations, pictures, etc. with min assist verbal and written cues.  SLP Short Term Goal 2 - Progress (Week 3): Met SLP Short Term Goal 3 (Week 3): Patient will self monitor accuracy of verbal expression with min assist verbal and non-verbal cues. SLP Short Term Goal 3 - Progress (Week 3): Progressing toward goal SLP Short Term Goal 4 (Week 3): Patient will write by copying basic, biographical information with min assist multi-modal cues to self monitor and correct errors. SLP Short Term Goal 4 - Progress (Week 3): Met SLP Short Term Goal 5 (Week 3): During non-structed verbal expression tasks patient with produce grammaticcaly correct phrases with max assist multi-modal cues. SLP Short Term Goal 5 - Progress (Week 3): Met Week 4: SLP Short Term Goal 1 (Week 4): Patient will verbally complete automatic sequences with supervision level assist phonemic cues. SLP Short Term Goal 2 (Week 4): Patient will expand sentences to include functors to describe/explain/comment on situations, pictures, etc. with min assist verbal and written cues. SLP Short Term Goal 3 (Week 4): Patient will self monitor accuracy of verbal expression with min assist verbal and non-verbal cues.  SLP Short Term Goal 4 (Week 4): Patient will generate written biographical information after looking at information with min assist multi-modal cues to self monitor and correct errors.  Weekly Progress Updates: Patient met 3 out of 5 short term objectives  this reporting period with gains in verball expression during structured and non-structured tasks as well as with written expression.  [name] continues to require min assist across all modes of communcation due to decreased self monitoring that SLP suspects is a result of impairments to her auditory feedback loop.  This week patient also participated in an outing which focused on advocating for herself and utilizing communication strategies in a community setting.  Overall, mixed aphasia persists however at this time SLP is addressing verbal and written expression since receptive abilites are more intact.  Patient would continue to benefit from skilled SLP services to [facility] her funcitonal communication ability prior to discharge.  SLP Frequency: 1-2 X/day, 30-60 minutes;5 out of 7 days Estimated Length of Stay: anticipated discharge 11/21  SLP Treatment/Interventions: Cognitive remediation/compensation;Cueing hierarchy;Environmental controls;Functional tasks;Internal/external aids;Patient/family education;Speech/Language facilitation;Therapeutic Activities;Therapeutic Exercise  Charlane Ferretti., CCC-SLP [phone]  Margaret Hess 07/15/2012, 10:17 AM

## 2012-07-15 NOTE — Progress Notes (Signed)
Social Work Patient ID: Margaret Hess, female   DOB: 20-Dec-1965, 46 y.o.   MRN: [id number] Met with pt and spoke with sister regarding team conference progression toward goals and discharge extension 11/27.  Pt wanted to go on the 21 but understands and wants to make As much progress as possible before discharge.  Discussed a home evaluation might be beneficial prior to discharge and would answer sister's numerous questions.  She will come back Whenever team needs her to be here.  Pt has numerous people who can assist and make adaptations to home if necessary.  Continue to work toward discharge date.

## 2012-07-15 NOTE — Progress Notes (Signed)
SLP Cancellation Note  Patient Details Name: Margaret Hess MRN: [id number] DOB: 08/19/1966   Cancelled treatment:        Patient missed 60 minutes of skilled SLP services due to report of dizziness and not feeling right.  Patient appeared pale and SLP notified RN and MD.  Assessment revealed low blood pressure and patient reported feeling fatigued and wanting to rest; as a result to SLP therapy cancelled.    Fae Pippin, M.A., CCC-SLP [phone]  [name] 07/15/2012, 10:24 AM

## 2012-07-15 NOTE — Progress Notes (Signed)
Occupational Therapy Session Note  Patient Details  Name: Margaret Hess MRN: [id number] Date of Birth: 1965/12/25  Today's Date: 07/15/2012 Time: 0805-0900 and 1100 Time Calculation (min): 55 min and 0 min (cancellation due to patient fatigue and c/o dizziness due to low blood pressure, pt declined participating)  Short Term Goals: Week 1:  OT Short Term Goal 1 (Week 1): Pt will maintain dynamic sitting balance with mod assist during selfcare activities. OT Short Term Goal 1 - Progress (Week 1): Met OT Short Term Goal 2 (Week 1): Pt will position the LUE prior and after transitional movements with mod questioning cues. OT Short Term Goal 2 - Progress (Week 1): Met OT Short Term Goal 3 (Week 1): Pt will perform all bathing with mod assist sit to stand. OT Short Term Goal 3 - Progress (Week 1): Met OT Short Term Goal 4 (Week 1): Pt will perform UB dressing with min assist to donn pullover shirt. OT Short Term Goal 4 - Progress (Week 1): Met OT Short Term Goal 5 (Week 1): Pt will perform toilet transfers with mod assist to 3:1. Week 2:  OT Short Term Goal 1 (Week 2): Pt will be able to stand at sink with min assist to allow her to pull up her pants with min assist. OT Short Term Goal 1 - Progress (Week 2): Met OT Short Term Goal 2 (Week 2): Pt will bathe in shower with steady assist using long sponge. OT Short Term Goal 2 - Progress (Week 2): Partly met OT Short Term Goal 3 (Week 2): Pt will perform self ROM to LUE with min verbal cues. OT Short Term Goal 3 - Progress (Week 2): Met OT Short Term Goal 4 (Week 2): Pt will transfer to toilet with min assist. OT Short Term Goal 4 - Progress (Week 2): Met Week 3:  OT Short Term Goal 1 (Week 3): Pt will be able to transfer to toilet with steady assist. OT Short Term Goal 1 - Progress (Week 3): Progressing toward goal OT Short Term Goal 2 (Week 3): Pt will be able to bathe with steady assist. OT Short Term Goal 2 - Progress (Week 3):  Progressing toward goal OT Short Term Goal 3 (Week 3): Pt will be able to pull pants over hips with steady assist. OT Short Term Goal 3 - Progress (Week 3): Progressing toward goal  Skilled Therapeutic Interventions/Progress Updates:    Visit 1:   Pt seen for BADL retraining of toileting, bathing, and dressing with a focus on transfers, standing balance, LUE management. Visit 2: Pt not feeling well and wanted to rest. Declined PROM exercises. Pt's arm positioned in bed with pillows.  Therapy Documentation Precautions:  Precautions Precautions: Fall Precaution Comments: aphasic; LLE flexor withdrawal at times Restrictions Weight Bearing Restrictions: No  Pain: Pain Assessment Pain Assessment: Faces Pain Score:   7 (with therapy) Faces Pain Scale: Hurts whole lot Pain Type: Chronic pain Pain Location: Back Pain Orientation: Lower Pain Descriptors: Aching Pain Onset: On-going Pain Intervention(s): Shower;Rest ADL:  See FIM for current functional status  Therapy/Group: Individual Therapy  Willie Plain 07/15/2012, 10:41 AM

## 2012-07-15 NOTE — Progress Notes (Signed)
ANTICOAGULATION CONSULT NOTE - Follow Up Consult  Pharmacy Consult for Coumadin Indication: LV thrombus/ secondary stroke prophylaxis  No Known Allergies  Patient Measurements: Height: 5\' 6"  (167.6 cm) Weight: 175 lb 4.3 oz (79.5 kg) IBW/kg (Calculated) : 59.3   Vital Signs: Temp: 99 F (37.2 C) (11/13 0500) Temp src: Oral (11/13 0500) BP: 102/66 mmHg (11/13 1205) Pulse Rate: 66  (11/13 1205)  Labs:  Basename 07/15/12 0847 07/15/12 0610 07/14/12 0651 07/13/12 0640  HGB -- 11.3* -- --  HCT -- 33.1* -- --  PLT -- 290 -- --  APTT -- -- -- --  LABPROT -- 23.5* 24.9* 24.3*  INR -- 2.20* 2.38* 2.30*  HEPARINUNFRC -- -- -- --  CREATININE 0.70 -- -- --  CKTOTAL -- -- -- --  CKMB -- -- -- --  TROPONINI -- -- -- --    Estimated Creatinine Clearance: 93.5 ml/min (by C-G formula based on Cr of 0.7).  Assessment: 45yof discharged from [facility] on 10/18 on coumadin 5mg  daily and lovenox 80mg  q12 for new LV thrombus. INR 2.15 at time of discharge after only 2 doses of coumadin. Returns to the hospital that same night with difficulty speaking and L sided weakness. 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 INR therapeutic today and stabile. Will change protime to MWF. Lower goal desired due to recent bleed. H/h continues to decrease. No bleeding reported. Goal of Therapy:  INR 2-2.5 (lower goal secondary to hemorrhagic transformation of CVA)   Plan:  Continue Coumadin 2.5mg  daily for maintenance. Change protime to MWF.  Verlene Mayer, PharmD, BCPS Pager [phone] 07/15/2012 12:59 PM

## 2012-07-16 ENCOUNTER — Inpatient Hospital Stay (HOSPITAL_COMMUNITY): Payer: BC Managed Care – PPO | Admitting: Speech Pathology

## 2012-07-16 ENCOUNTER — Inpatient Hospital Stay (HOSPITAL_COMMUNITY): Payer: BC Managed Care – PPO | Admitting: Occupational Therapy

## 2012-07-16 ENCOUNTER — Inpatient Hospital Stay (HOSPITAL_COMMUNITY): Payer: BC Managed Care – PPO | Admitting: *Deleted

## 2012-07-16 ENCOUNTER — Encounter (HOSPITAL_COMMUNITY): Payer: BC Managed Care – PPO

## 2012-07-16 ENCOUNTER — Inpatient Hospital Stay (HOSPITAL_COMMUNITY): Payer: BC Managed Care – PPO | Admitting: Physical Therapy

## 2012-07-16 DIAGNOSIS — G8110 Spastic hemiplegia affecting unspecified side: Secondary | ICD-10-CM

## 2012-07-16 DIAGNOSIS — I69991 Dysphagia following unspecified cerebrovascular disease: Secondary | ICD-10-CM

## 2012-07-16 DIAGNOSIS — E119 Type 2 diabetes mellitus without complications: Secondary | ICD-10-CM

## 2012-07-16 DIAGNOSIS — I69920 Aphasia following unspecified cerebrovascular disease: Secondary | ICD-10-CM

## 2012-07-16 LAB — GLUCOSE, CAPILLARY
Glucose-Capillary: 143 mg/dL — ABNORMAL HIGH (ref 70–99)
Glucose-Capillary: 124 mg/dL — ABNORMAL HIGH (ref 70–99)
Glucose-Capillary: 131 mg/dL — ABNORMAL HIGH (ref 70–99)

## 2012-07-16 MED ORDER — CARVEDILOL 6.25 MG PO TABS
6.2500 mg | ORAL_TABLET | Freq: Two times a day (BID) | ORAL | Status: DC
  Administered 2012-07-16 – 2012-07-22 (×12): 6.25 mg via ORAL
  Filled 2012-07-16 (×14): qty 1

## 2012-07-16 MED ORDER — POLYETHYLENE GLYCOL 3350 17 G PO PACK
17.0000 g | PACK | Freq: Once | ORAL | Status: DC
  Filled 2012-07-16: qty 1

## 2012-07-16 NOTE — Progress Notes (Signed)
Speech Language Pathology Daily Session Note  Patient Details  Name: Margaret Hess MRN: [id number] Date of Birth: 1965/09/24  Today's Date: 07/16/2012 Time: 0930-1030 Time Calculation (min): 60 min  Short Term Goals: Week 4: SLP Short Term Goal 1 (Week 4): Patient will verbally complete automatic sequences with supervision level assist phonemic cues. SLP Short Term Goal 2 (Week 4): Patient will expand sentences to include functors to describe/explain/comment on situations, pictures, etc. with min assist verbal and written cues. SLP Short Term Goal 3 (Week 4): Patient will self monitor accuracy of verbal expression with min assist verbal and non-verbal cues.  SLP Short Term Goal 4 (Week 4): Patient will generate written biographical information after looking at information with min assist multi-modal cues to self monitor and correct errors.  Skilled Therapeutic Interventions: Treatment session focused on addressing speech-language goals.  SLP facilitated writing biographical information by presenting information, fading it and then having patient write info; patient preformed task of writing name with 1 error and cue to recognize and correct.  To complete address patient identified that she needed to continue to copy information to be accurate.  SLP also facilitated session with verbal expression by requesting patient describe what people were doing in a task; patient produce 6-8 word sentences with max faded to mod assist cues to self-monitor and correct appropriate use of functors.     FIM:  Comprehension Comprehension Mode: Auditory Comprehension: 5-Follows basic conversation/direction: With extra time/assistive device Expression Expression Mode: Verbal Expression: 5-Expresses basic needs/ideas: With extra time/assistive device Social Interaction Social Interaction: 6-Interacts appropriately with others with medication or extra time (anti-anxiety, antidepressant). Problem  Solving Problem Solving: 5-Solves basic 90% of the time/requires cueing < 10% of the time Memory Memory: 5-Recognizes or recalls 90% of the time/requires cueing < 10% of the time FIM - Eating Eating Activity: 5: Set-up assist for open containers  Pain Pain Assessment Pain Assessment: No/denies pain  Therapy/Group: Individual Therapy  Charlane Ferretti., CCC-SLP [phone]  Alamin Mccuiston 07/16/2012, 11:53 AM

## 2012-07-16 NOTE — Progress Notes (Signed)
Recreational Therapy Session Note  Patient Details  Name: Margaret Hess MRN: [id number] Date of Birth: [birth year] Today's Date: 07/16/2012 Time:  4098-1191 Pain: no c/o Skilled Therapeutic Interventions/Progress Updates: pt seated in w/c upon entry to the room.  Pt discussed change in discharge plans is now to to [facility], session spent discussing this change.  At end of visit,  Pt stated she was not feeling well, a little dizzy, NT into check vitals and BP within normal limits.  Assisted pt back to bed, min assist transfer.    Neilan Rizzo 07/16/2012, 4:27 PM

## 2012-07-16 NOTE — Progress Notes (Addendum)
Advanced Heart Failure Rounding Note   Subjective:    46 yo woman with history of DM,  HTN, NICM,  EF <20%.  LV thrombus, and Grade 3 diastolic dysfunction.  Mod TR.   Discharged from [facility] 06/19/12 after being treated for acute systolic heart failure and LV thrombus. Discharged on loveneox and coumadin.   Presented to ED 06/20/12 with difficulty speaking and L sided weakness. CT of head negative. 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 Working with rehab. LLE strength and expressive aphasia improving some.   Last week Spironolactone 12.5 mg added. Most recent renal function has remained stable. Weight up 10 pounds over the last 2 days. INR 2.8.  07/07/12 ECHO EF 20-25% grade II diastolic dysfunction  Denies SOB/orthopnea/PND.  No edema.  Left side remains weak.  Weight increasing but using bed scale.    Objective:     Vital Signs:   Temp:  [98.6 F (37 C)] 98.6 F (37 C) (11/14 0552) Pulse Rate:  [62-93] 93  (11/14 0900) Resp:  [18] 18  (11/14 0552) BP: (88-123)/(55-79) 123/77 mmHg (11/14 0900) SpO2:  [96 %] 96 % (11/14 0552) Weight:  [82.6 kg (182 lb 1.6 oz)] 82.6 kg (182 lb 1.6 oz) (11/14 0552) Last BM Date: 07/14/12  Weight change: Filed Weights   07/14/12 0500 07/15/12 0500 07/16/12 0552  Weight: 78.1 kg (172 lb 2.9 oz) 79.5 kg (175 lb 4.3 oz) 82.6 kg (182 lb 1.6 oz)    Intake/Output:   Intake/Output Summary (Last 24 hours) at 07/16/12 0925 Last data filed at 07/15/12 1308  Gross per 24 hour  Intake    240 ml  Output      0 ml  Net    240 ml     Physical Exam:            General:  Well appearing. No resp difficulty sitting in WC HEENT: normal Neck: supple. JVP flat.   Carotids 2+ bilat; no bruits. No lymphadenopathy or thryomegaly appreciated. Cor: PMI nondisplaced. Regular.  No gallops, rubs, or murmurs. Lungs:  clear Abdomen: soft, nontender, nondistended. No hepatosplenomegaly. No bruits or masses. Good bowel sounds. Extremities: no cyanosis, clubbing, rash, edema L arm flaccid Neuro: still with expressive aphasia. Writing things down. Weak on L    Labs: Basic Metabolic Panel:  Lab 07/15/12 2130 07/10/12 0520  NA 135 135  K 4.2 4.6  CL 95* 96  CO2 23 26  GLUCOSE 253* 136*  BUN 21 25*  CREATININE 0.70 0.75  CALCIUM 9.4 9.8  MG -- --  PHOS -- --    Liver Function Tests: No results found for this basename: AST:5,ALT:5,ALKPHOS:5,BILITOT:5,PROT:5,ALBUMIN:5 in the last 168 hours No results found for this basename: LIPASE:5,AMYLASE:5 in the last 168 hours No results found for this basename: AMMONIA:3 in the last 168 hours  CBC:  Lab 07/15/12 0610  WBC 9.9  NEUTROABS --  HGB 11.3*  HCT 33.1*  MCV 86.2  PLT 290    Cardiac Enzymes: No results found for this basename: CKTOTAL:5,CKMB:5,CKMBINDEX:5,TROPONINI:5 in the last 168 hours  BNP: BNP (last 3 results)  Basename 06/17/12 0455 06/15/12 1720  PROBNP 1930.0* 6932.0*       Imaging: No results found.   Medications:     Scheduled Medications:    . carvedilol  3.125 mg Oral BID WC  . digoxin  0.125 mg Oral Daily  .  furosemide  40 mg Oral Daily  . gabapentin  600 mg Oral TID  . insulin aspart  0-15 Units Subcutaneous TID WC  . lisinopril  2.5 mg Oral BID  . metFORMIN  1,000 mg Oral BID WC  . pantoprazole  40 mg Oral QHS  . spironolactone  12.5 mg Oral Daily  . warfarin  2.5 mg Oral q1800  . Warfarin - Pharmacist Dosing Inpatient   Does not apply q1800  . [DISCONTINUED] tiZANidine  2 mg Oral TID    Infusions:    PRN Medications: acetaminophen, albuterol, Muscle Rub, ondansetron (ZOFRAN) IV, ondansetron, polyethylene glycol, sorbitol, traZODone   Assessment:   1. CVA, acute hemorrhagic R MCA    --c/b expressive aphasia and L-sided hemiparesis 2. 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 NICM, EF <20% 4.  LV thrombus  5. DM   6. Hypertension  Plan/Discussion:    Weights are increasing but clinically does not appear to have volume on board.  They have been using the bed scale.  Will have them reweigh on standing scale and ask that they use this scale daily.    Continue coumadin for LV thrombus although no longer visible on echo.  No bleeding.  No BM in 3 days will give miralax today.    Robbi Garter, [facility] 9:25 AM  Patient seen and examined with Ulyess Blossom, PA-C. We discussed all aspects of the encounter. I agree with the assessment and plan as stated above.   Weight is up today but volume doesn't look bad. Agree with reweighing. Staff also has said that she has been eating a lot. Continue current diuretics. Can check BNP. Will continue to follow.  Truman Hayward

## 2012-07-16 NOTE — Progress Notes (Signed)
Occupational Therapy Session Note  Patient Details  Name: Margaret Hess MRN: [id number] Date of Birth: Oct 04, 1965  Today's Date: 07/16/2012 Time: 0805-0905 and 1100-1130 Time Calculation (min): 60 min and 30 min Short Term Goals: Week 1:  OT Short Term Goal 1 (Week 1): Pt will maintain dynamic sitting balance with mod assist during selfcare activities. OT Short Term Goal 1 - Progress (Week 1): Met OT Short Term Goal 2 (Week 1): Pt will position the LUE prior and after transitional movements with mod questioning cues. OT Short Term Goal 2 - Progress (Week 1): Met OT Short Term Goal 3 (Week 1): Pt will perform all bathing with mod assist sit to stand. OT Short Term Goal 3 - Progress (Week 1): Met OT Short Term Goal 4 (Week 1): Pt will perform UB dressing with min assist to donn pullover shirt. OT Short Term Goal 4 - Progress (Week 1): Met OT Short Term Goal 5 (Week 1): Pt will perform toilet transfers with mod assist to 3:1. Week 2:  OT Short Term Goal 1 (Week 2): Pt will be able to stand at sink with min assist to allow her to pull up her pants with min assist. OT Short Term Goal 1 - Progress (Week 2): Met OT Short Term Goal 2 (Week 2): Pt will bathe in shower with steady assist using long sponge. OT Short Term Goal 2 - Progress (Week 2): Partly met OT Short Term Goal 3 (Week 2): Pt will perform self ROM to LUE with min verbal cues. OT Short Term Goal 3 - Progress (Week 2): Met OT Short Term Goal 4 (Week 2): Pt will transfer to toilet with min assist. OT Short Term Goal 4 - Progress (Week 2): Met Week 3:  OT Short Term Goal 1 (Week 3): Pt will be able to transfer to toilet with steady assist. OT Short Term Goal 1 - Progress (Week 3): Progressing toward goal OT Short Term Goal 2 (Week 3): Pt will be able to bathe with steady assist. OT Short Term Goal 2 - Progress (Week 3): Progressing toward goal OT Short Term Goal 3 (Week 3): Pt will be able to pull pants over hips with steady  assist. OT Short Term Goal 3 - Progress (Week 3): Progressing toward goal  Skilled Therapeutic Interventions/Progress Updates:      Visit 1: Pt seen for BADL retraining of toileting, bathing, and dressing with a focus on symmetrical sit to stand and stand pivot transfers.  Pt has become much more proficient with squat pivot transfers with steady assist and min to mod verbal cues, therefore she is ready to work on a higher level transfer.  Pt did well coming into stand with cues to maintain head in midline versus pushing to the right.   She was also able to advance left leg and position it with min assist to perform stand pivot transfers. Pt continues to do well with managing clothing over hips.  Visit 2: Therapy focused on trunk extension control activities to facilitate scapular movement of retraction.  Pt continues to have no active movement in LUE, but has some flexor tone.  Attempted to facilitate movement with weight bearing, gravity eliminated forward reaching and elbow flexion, hand over hand grasping of cones. Pt states that she can feel me moving her arm, but she can not feel anything.  She does have quite a bit of neuropathic pain, focused tx on scapular PROM and positioning.  Therapy Documentation Precautions:  Precautions Precautions: Fall Precaution Comments:  aphasic; LLE flexor withdrawal at times Restrictions Weight Bearing Restrictions: No    Vital Signs: Therapy Vitals Pulse Rate: 93  BP: 123/77 mmHg Pain: Pain Assessment Pain Assessment: No/denies pain Pain Score:   5 Pain Type: Neuropathic pain Pain Location: Arm (and leg) Pain Orientation: Left Pain Descriptors: Tingling ADL:  See FIM for current functional status  Therapy/Group: Individual Therapy  Vyla Pint 07/16/2012, 11:52 AM

## 2012-07-16 NOTE — Progress Notes (Signed)
Physical Therapy Session Note  Patient Details  Name: Margaret Hess MRN: [id number] Date of Birth: 1966-03-09  Today's Date: 07/16/2012 Time: 1400-1443 Time Calculation (min): 43 min  Short Term Goals: Week 2:  PT Short Term Goal 1 (Week 2): Pt will perform rolling R after  2 VCs with supervision. PT Short Term Goal 1 - Progress (Week 2): Met PT Short Term Goal 2 (Week 2): Pt will perform basic transfer with min assist. PT Short Term Goal 2 - Progress (Week 2): Met PT Short Term Goal 3 (Week 2): Pt will stand during functional activity x 10 minutes with mod assist for balance. PT Short Term Goal 3 - Progress (Week 2): Not met PT Short Term Goal 4 (Week 2): Pt will perform gait x 10' with LRAD, assist of 1 person. PT Short Term Goal 4 - Progress (Week 2): Partly met PT Short Term Goal 5 (Week 2): Pt will propel w/c 150' with supervision, with 0-1 Vc for route finding. PT Short Term Goal 5 - Progress (Week 2): Met  Skilled Therapeutic Interventions/Progress Updates:    Standing balance activities stacking cones with Rt. UE and reaching with rings with Lt. UE supported on elevated mat (PT to hold in place), PT to facilitate Lt. Knee control.  Pt dizzy post exercise BP 108/82, HR 107. Pt recovered with seated rest. Ambulation x 10' with platform walker, mod assist overall with max verbal cues for sequencing, Lt. UE needed frequent placement secondary to tone. Pt has poor Lt. Knee control and tends to hyperextend unless controlled by therapist. Pt limited by lightheadedness/"cloudiness" today. Wheelchair mobility negotiating tight turns and hallway, min assist needed secondary to decreased ability to avoid obstacles on Lt., tendency to veer Lt.   Therapy Documentation Precautions:  Precautions Precautions: Fall Precaution Comments: aphasic; LLE flexor withdrawal at times Restrictions Weight Bearing Restrictions: No Pain: Pain Assessment Pain Assessment: No/denies pain Locomotion  : Ambulation Ambulation/Gait Assistance: 3: Mod assist   See FIM for current functional status  Therapy/Group: Individual Therapy  Wilhemina Bonito 07/16/2012, 5:36 PM

## 2012-07-16 NOTE — Progress Notes (Signed)
Social Work Patient ID: Margaret Hess, female   DOB: August 26, 1966, 46 y.o.   MRN: [id number] Met with pt to discuss discharge options.  She also thought her insurance would cover care at home.  After our discussion she has decided The best option for her is NHP.  She is aware her insurance may not approve this but she will have her friend contact them.  She thought her Sister would come and help but needs assistance longer than 3-4 weeks.  Will begin NH process.

## 2012-07-16 NOTE — Progress Notes (Signed)
Patient ID: Horald Pollen, female   DOB: 05-Dec-1965, 46 y.o.   MRN: [id number]  Subjective/Complaints: No further dizziness Reviewed BPs Review of Systems  Unable to perform ROS: medical condition   Objective: Vital Signs: Blood pressure 118/79, pulse 89, temperature 98.6 F (37 C), temperature source Oral, resp. rate 18, height 5\' 6"  (1.676 m), weight 82.6 kg (182 lb 1.6 oz), SpO2 96.00%. No results found. Results for orders placed during the hospital encounter of 06/25/12 (from the past 72 hour(s))  GLUCOSE, CAPILLARY     Status: Abnormal   Collection Time   07/13/12 11:35 AM      Component Value Range Comment   Glucose-Capillary 166 (*) 70 - 99 mg/dL    Comment 1 Notify RN     GLUCOSE, CAPILLARY     Status: Abnormal   Collection Time   07/13/12  4:45 PM      Component Value Range Comment   Glucose-Capillary 157 (*) 70 - 99 mg/dL    Comment 1 Notify RN     GLUCOSE, CAPILLARY     Status: Abnormal   Collection Time   07/13/12  8:52 PM      Component Value Range Comment   Glucose-Capillary 126 (*) 70 - 99 mg/dL    Comment 1 Notify RN     PROTIME-INR     Status: Abnormal   Collection Time   07/14/12  6:51 AM      Component Value Range Comment   Prothrombin Time 24.9 (*) 11.6 - 15.2 seconds    INR 2.38 (*) 0.00 - 1.49   GLUCOSE, CAPILLARY     Status: Abnormal   Collection Time   07/14/12  7:23 AM      Component Value Range Comment   Glucose-Capillary 136 (*) 70 - 99 mg/dL    Comment 1 Notify RN     GLUCOSE, CAPILLARY     Status: Abnormal   Collection Time   07/14/12  4:35 PM      Component Value Range Comment   Glucose-Capillary 118 (*) 70 - 99 mg/dL   GLUCOSE, CAPILLARY     Status: Abnormal   Collection Time   07/14/12  9:00 PM      Component Value Range Comment   Glucose-Capillary 118 (*) 70 - 99 mg/dL   PROTIME-INR     Status: Abnormal   Collection Time   07/15/12  6:10 AM      Component Value Range Comment   Prothrombin Time 23.5 (*) 11.6 - 15.2 seconds    INR 2.20 (*) 0.00 - 1.49   CBC     Status: Abnormal   Collection Time   07/15/12  6:10 AM      Component Value Range Comment   WBC 9.9  4.0 - 10.5 K/uL    RBC 3.84 (*) 3.87 - 5.11 MIL/uL    Hemoglobin 11.3 (*) 12.0 - 15.0 g/dL    HCT 40.9 (*) 81.1 - 46.0 %    MCV 86.2  78.0 - 100.0 fL    MCH 29.4  26.0 - 34.0 pg    MCHC 34.1  30.0 - 36.0 g/dL    RDW 91.4  78.2 - 95.6 %    Platelets 290  150 - 400 K/uL   GLUCOSE, CAPILLARY     Status: Abnormal   Collection Time   07/15/12  7:57 AM      Component Value Range Comment   Glucose-Capillary 152 (*) 70 - 99 mg/dL    Comment  1 Documented in Chart      Comment 2 Notify RN     BASIC METABOLIC PANEL     Status: Abnormal   Collection Time   07/15/12  8:47 AM      Component Value Range Comment   Sodium 135  135 - 145 mEq/L    Potassium 4.2  3.5 - 5.1 mEq/L    Chloride 95 (*) 96 - 112 mEq/L    CO2 23  19 - 32 mEq/L    Glucose, Bld 253 (*) 70 - 99 mg/dL    BUN 21  6 - 23 mg/dL    Creatinine, Ser 1.61  0.50 - 1.10 mg/dL    Calcium 9.4  8.4 - 09.6 mg/dL    GFR calc non Af Amer >90  >90 mL/min    GFR calc Af Amer >90  >90 mL/min   GLUCOSE, CAPILLARY     Status: Abnormal   Collection Time   07/15/12  9:37 AM      Component Value Range Comment   Glucose-Capillary 231 (*) 70 - 99 mg/dL    Comment 1 Documented in Chart      Comment 2 Notify RN     GLUCOSE, CAPILLARY     Status: Abnormal   Collection Time   07/15/12 11:34 AM      Component Value Range Comment   Glucose-Capillary 142 (*) 70 - 99 mg/dL    Comment 1 Notify RN     GLUCOSE, CAPILLARY     Status: Abnormal   Collection Time   07/15/12  4:21 PM      Component Value Range Comment   Glucose-Capillary 136 (*) 70 - 99 mg/dL   GLUCOSE, CAPILLARY     Status: Abnormal   Collection Time   07/15/12  9:34 PM      Component Value Range Comment   Glucose-Capillary 128 (*) 70 - 99 mg/dL    Comment 1 Notify RN     GLUCOSE, CAPILLARY     Status: Abnormal   Collection Time   07/16/12   7:27 AM      Component Value Range Comment   Glucose-Capillary 155 (*) 70 - 99 mg/dL      HEENT: normal Cardio: RRR Resp: CTA B/L GI: BS positive Extremity:  No Edema Skin:   Intact Neuro: Abnormal Sensory absent light touch and proprio, positive deep pain, Abnormal Motor 0/5 in LUE and LLE, Abnormal FMC Tone  reduced on L side, Tone:  Hypotonia, Aphasic, Inattention and Apraxic Musc/Skel:  Swelling L dorsum hand ecchymosis and swelling-mild Increased L hamstrings and hip adductor tone--unchanged  Assessment/Plan: 1. Functional deficits secondary to R MCA infarct L HP, crossed aphasia,apraxia  which require 3+ hours per day of interdisciplinary therapy in a comprehensive inpatient rehab setting. Physiatrist is providing close team supervision and 24 hour management of active medical problems listed below. Physiatrist and rehab team continue to assess barriers to discharge/monitor patient progress toward functional and medical goals. FIM: FIM - Bathing Bathing Steps Patient Completed: Chest;Right Arm;Left Arm;Abdomen;Front perineal area;Buttocks;Right lower leg (including foot);Left upper leg;Right upper leg;Left lower leg (including foot) Bathing: 4: Steadying assist  FIM - Upper Body Dressing/Undressing Upper body dressing/undressing steps patient completed: Thread/unthread left sleeve of pullover shirt/dress;Put head through opening of pull over shirt/dress;Pull shirt over trunk;Thread/unthread right sleeve of pullover shirt/dresss Upper body dressing/undressing: 5: Supervision: Safety issues/verbal cues FIM - Lower Body Dressing/Undressing Lower body dressing/undressing steps patient completed: Thread/unthread right underwear leg;Thread/unthread left underwear leg;Pull underwear  up/down;Thread/unthread right pants leg;Thread/unthread left pants leg;Pull pants up/down;Don/Doff right shoe Lower body dressing/undressing: 4: Steadying Assist  FIM - Toileting Toileting steps completed  by patient: Adjust clothing prior to toileting;Performs perineal hygiene;Adjust clothing after toileting Toileting Assistive Devices: Grab bar or rail for support Toileting: 4: Steadying assist  FIM - Diplomatic Services operational officer Devices: Elevated toilet seat;Grab bars Toilet Transfers: 4-To toilet/BSC: Min A (steadying Pt. > 75%);4-From toilet/BSC: Min A (steadying Pt. > 75%)  FIM - Bed/Chair Transfer Bed/Chair Transfer Assistive Devices: Arm rests Bed/Chair Transfer: 0: Activity did not occur  FIM - Locomotion: Wheelchair Locomotion: Wheelchair: 0: Activity did not occur FIM - Locomotion: Ambulation Locomotion: Ambulation Assistive Devices: Programmer, systems - Platform Ambulation/Gait Assistance: 3: Mod assist Locomotion: Ambulation: 0: Activity did not occur  Comprehension Comprehension Mode: Auditory Comprehension: 5-Follows basic conversation/direction: With no assist  Expression Expression Mode: Verbal Expression: 5-Expresses basic needs/ideas: With extra time/assistive device  Social Interaction Social Interaction Mode: Asleep Social Interaction: 6-Interacts appropriately with others with medication or extra time (anti-anxiety, antidepressant).  Problem Solving Problem Solving Mode: Asleep Problem Solving: 5-Solves basic problems: With no assist  Memory Memory Mode: Asleep Memory: 5-Recognizes or recalls 90% of the time/requires cueing < 10% of the time  Medical Problem List and Plan:  1. 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 DVT Prophylaxis/Anticoagulation: Chronic Coumadin therapy with a goal INR of 2-2.5 secondary hemorrhagic transformation  3. Neuropsych: This patient is not capable of making decisions on his/her own behalf.  4. Hypertension/nonischemic cardiomyopathy with grade 3 diastolic dysfunction.Reduce  Lisinopril to  2.5 mg twice a day, Lanoxin 0.125 mg daily. Monitor with  increased mobility. Cardiology to follow up as needed(Dr. Bensimhon), TED hose when OOB Discussed with cardiology today no changes 5. Diabetes mellitus. Patient currently on sliding scale insulin. Patient on Glucophage 500 mg twice a day prior to admission and victoza 1.2 mL subcutaneously daily. Check blood sugars a.c. and at bedtime, Is on hi dose metformin 1000mg  BID with controlled CBGs.  If this fails then restart Victoza--improved control at present 6. Mood/depression. Patient on Wellbutrin prior to admission 75 mg daily. We'll discuss resuming this medication. Provide emotional support and positive reinforcement  7. 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  Back pain mild/mod will use K pad and analgesic balm  -discussed Vonna Kotyk back cushion with PT 9.  Spasticity  off Zanaflex probable cause of orthostasis, monitor consider baclofen if tone increased LOS (Days) 21 A FACE TO FACE EVALUATION WAS PERFORMED  KIRSTEINS,ANDREW E 07/16/2012, 7:59 AM

## 2012-07-17 ENCOUNTER — Inpatient Hospital Stay (HOSPITAL_COMMUNITY): Payer: BC Managed Care – PPO | Admitting: Occupational Therapy

## 2012-07-17 ENCOUNTER — Inpatient Hospital Stay (HOSPITAL_COMMUNITY): Payer: BC Managed Care – PPO | Admitting: Speech Pathology

## 2012-07-17 ENCOUNTER — Inpatient Hospital Stay (HOSPITAL_COMMUNITY): Payer: BC Managed Care – PPO

## 2012-07-17 LAB — GLUCOSE, CAPILLARY: Glucose-Capillary: 130 mg/dL — ABNORMAL HIGH (ref 70–99)

## 2012-07-17 LAB — PROTIME-INR: Prothrombin Time: 23.9 seconds — ABNORMAL HIGH (ref 11.6–15.2)

## 2012-07-17 MED ORDER — SPIRONOLACTONE 25 MG PO TABS
25.0000 mg | ORAL_TABLET | Freq: Every day | ORAL | Status: DC
  Administered 2012-07-18 – 2012-07-22 (×5): 25 mg via ORAL
  Filled 2012-07-17 (×7): qty 1

## 2012-07-17 MED ORDER — LISINOPRIL 2.5 MG PO TABS
2.5000 mg | ORAL_TABLET | Freq: Every day | ORAL | Status: DC
  Administered 2012-07-20 – 2012-07-22 (×3): 2.5 mg via ORAL
  Filled 2012-07-17 (×6): qty 1

## 2012-07-17 NOTE — Progress Notes (Signed)
Physical Therapy Note  Patient Details  Name: Margaret Hess MRN: [id number] Date of Birth: July 24, 1966 Today's Date: 07/17/2012 10:30 - 11:00 am 30 minutes Patient denies pain. Individual treatment session.  Patient to gym via wheelchair. Patient sit to stand and ambulated 55 feet x 2 with moderate assist. Patient requires assistance to maintain left UE positioned on platform and walker straight. Patient also requires facilitation of left LE for hip and knee control during stance. Patient is able to progress left LE independently but occasionally has difficulty with control of placement. Patient returned to room via wheelchair.    Arelia Longest M 07/17/2012, 12:58 PM

## 2012-07-17 NOTE — Progress Notes (Signed)
Social Work Patient ID: Margaret Hess, female   DOB: 08/14/1966, 46 y.o.   MRN: [id number] Have spoken with [employer] who reports the therapists need to write up justification regarding pt going NHP and need for less intensive therapies and her ability to continue To make progress there.  Awaiting team's written justification.  Have also sent out FL2 to area facilities to see who would be interested or could meet pt's needs.

## 2012-07-17 NOTE — Progress Notes (Signed)
Occupational Therapy Weekly Progress Note  Patient Details  Name: Margaret Hess MRN: [id number] Date of Birth: May 15, 1966  Today's Date: 07/17/2012 Time: 0804-0900 Time Calculation (min): 56 min  1:1   Pt seen for BADL retraining of toileting, bathing, and dressing with a focus on stand pivot transfers, dynamic sitting balance, hemi-dressing techniques.  Pt's blood pressure was low at start of session, but she was able to participate well.  She needed steadying assist with standing for clothing management and min assist with her stand pivot transfers with out UE support and steadying assist with UE support. Pt was able to doff shirt with 1 instructional cue and don shirt with 3-4 cues.  Weekly Progress: Patient has met 3 of 3 short term goals.  Pt has been making good progress with her trunk control, sitting and standing balance, awareness of left side and midline.  She is able to complete most of her self care with just steadying assist and verbal cues.  She demonstrates good potential to further improve her level of independence with her self care, mobility, and communication skills with further OT in a SNF setting to increase her potential to return to a home setting.  Patient continues to demonstrate the following deficits: left hemiplegia, apraxia, decreased standing balance, expressive aphasia and therefore will continue to benefit from skilled OT intervention to enhance overall performance with BADL.  Patient progressing toward long term goals.  Plan of care revisions: D/C cooking goal, due to D/C placement..  OT Short Term Goals Week 1:  OT Short Term Goal 1 (Week 1): Pt will maintain dynamic sitting balance with mod assist during selfcare activities. OT Short Term Goal 1 - Progress (Week 1): Met OT Short Term Goal 2 (Week 1): Pt will position the LUE prior and after transitional movements with mod questioning cues. OT Short Term Goal 2 - Progress (Week 1): Met OT Short Term Goal 3  (Week 1): Pt will perform all bathing with mod assist sit to stand. OT Short Term Goal 3 - Progress (Week 1): Met OT Short Term Goal 4 (Week 1): Pt will perform UB dressing with min assist to donn pullover shirt. OT Short Term Goal 4 - Progress (Week 1): Met OT Short Term Goal 5 (Week 1): Pt will perform toilet transfers with mod assist to 3:1. Week 2:  OT Short Term Goal 1 (Week 2): Pt will be able to stand at sink with min assist to allow her to pull up her pants with min assist. OT Short Term Goal 1 - Progress (Week 2): Met OT Short Term Goal 2 (Week 2): Pt will bathe in shower with steady assist using long sponge. OT Short Term Goal 2 - Progress (Week 2): Partly met OT Short Term Goal 3 (Week 2): Pt will perform self ROM to LUE with min verbal cues. OT Short Term Goal 3 - Progress (Week 2): Met OT Short Term Goal 4 (Week 2): Pt will transfer to toilet with min assist. OT Short Term Goal 4 - Progress (Week 2): Met Week 3:  OT Short Term Goal 1 (Week 3): Pt will be able to transfer to toilet with steady assist. OT Short Term Goal 1 - Progress (Week 3): Met OT Short Term Goal 2 (Week 3): Pt will be able to bathe with steady assist. OT Short Term Goal 2 - Progress (Week 3): Met OT Short Term Goal 3 (Week 3): Pt will be able to pull pants over hips with steady assist. OT Short  Term Goal 3 - Progress (Week 3): Met Week 4:  OT Short Term Goal 1 (Week 4): Pt will be able to perform stand pivot transfers to toilet with min assist (versus squat pivot). OT Short Term Goal 1 - Progress (Week 4): Progressing toward goal OT Short Term Goal 2 (Week 4): Pt will demonstrate improved static standing balance by standing at sink for 30 seconds with supervision. OT Short Term Goal 2 - Progress (Week 4): Progressing toward goal OT Short Term Goal 3 (Week 4): Pt will be able to don shirt with only 2-3 verbal cues versus moderate verbal cues. OT Short Term Goal 3 - Progress (Week 4): Progressing toward  goal  Skilled Therapeutic Interventions/Progress Updates: OT 5-7 days a week, 60-90 min a day for  Balance/vestibular training;Cognitive remediation/compensation;Discharge planning;DME/adaptive equipment instruction;Functional mobility training;Patient/family education;Pain management;Neuromuscular re-education;Self Care/advanced ADL retraining;Therapeutic Activities;Therapeutic Exercise;UE/LE Strength taining/ROM;UE/LE Coordination activities;Visual/perceptual remediation/compensation to maximize patient's level of independence.  Therapy Documentation Precautions:  Precautions Precautions: Fall Precaution Comments: aphasic; LLE flexor withdrawal at times Restrictions Weight Bearing Restrictions: No    Vital Signs: Therapy Vitals Pulse Rate: 91  BP: 116/66 mmHg Patient Position, if appropriate: Sitting Oxygen Therapy SpO2: 98 % O2 Device: None (Room air) Pain: Pain Assessment Pain Assessment: No/denies pain ADL:  See FIM for current functional status  Therapy/Group: Individual Therapy  Shauntae Reitman 07/17/2012, 12:02 PM

## 2012-07-17 NOTE — Progress Notes (Signed)
Physical Therapy Weekly Progress Note  Patient Details  Name: Margaret Hess MRN: [id number] Date of Birth: 1966/08/19  Today's Date: 07/17/2012 Time: 1400-1445 Time Calculation (min): 45 min  Patient has met 3 of 4 short term goals. Patient has demonstrated supervision with transfers going to right and is progressing towards supervision with transfers to left. Patient needs cueing and occasional minimal steady assist when transferring to left.   Patient continues to demonstrate the following deficits: left hemiplegia; decreased functional balance; dependency with bed mobility, transfers, wheelchair mobility; and gait and therefore will continue to benefit from skilled PT intervention to enhance overall performance with balance, ability to compensate for deficits, functional use of  left upper extremity and left lower extremity and coordination. Patient is making great steady gains in right LE movement and mobility. Patient has the potential to be a community ambulator with assistive device in the future with continued physical therapy.     Patient progressing toward long term goals. Continue plan of care.  PT Short Term Goals Week 3:  PT Short Term Goal 1 (Week 3): Pt will roll R with 1 visual cue. PT Short Term Goal 1 - Progress (Week 3): Met PT Short Term Goal 2 (Week 3): Pt will transfer bed>< w/c with supervision. PT Short Term Goal 2 - Progress (Week 3): Progressing toward goal PT Short Term Goal 3 (Week 3): Pt will stand x 5 minutes during functional task with mod assist for balance. PT Short Term Goal 3 - Progress (Week 3): Met PT Short Term Goal 4 (Week 3): Pt will perform gait x 25' with LRAD and assist of 1 person. PT Short Term Goal 4 - Progress (Week 3): Met Week 4:  PT Short Term Goal 1 (Week 4): Patient will perform supine to sit on mat with verbal cueing.  PT Short Term Goal 2 (Week 4): Patient will perform basic transfer with supervision. PT Short Term Goal 3 (Week 4):  Patient will ambulate 80 feet on level tile with rolling platform walker and minimal assist. PT Short Term Goal 4 (Week 4): Patient will propel wheelchair 100 feet in controlled environment using right extremities.  Skilled Therapeutic Interventions/Progress Updates:  Patient continues to make progress in all areas. Patient is able to perform supine to and from sitting with bed rails and head of bed raised. Patient requires minimal assistance for controlled rolling and supine to sit from right side on the mat with no rails and a flat surface. Patient is close to reaching supervision for transfers. Patient occasionally requires minimal steadying assistance when transferring to weak side. Patient sit to stand with minimal assistance. Patient is ambulating with rolling walker and platform on left up to 55 feet on level tile surfaces. Patient is able to progress left LE during gait independently. Patient has some difficulty with coordination of movement during swing. Patient has left hip and knee extension during stance but requires facilitation of knee control to prevent hyperextension. Patient is using Allard brace/AFO during gait and transfers. Patient propels wheelchair 60 feet on level tile surfaces using right extremities with moderate assistance to avoid objects on left.     Therapy Documentation Precautions:  Precautions Precautions: Fall Precaution Comments: aphasic; LLE flexor withdrawal at times Restrictions Weight Bearing Restrictions: No   Pain: Pain Assessment Pain Assessment: No/denies pain Mobility:   Locomotion : Ambulation Ambulation/Gait Assistance: 3: Mod assist Wheelchair Mobility Distance: 60 feet    Other Treatments: PM session consisted on neuromuscular reeducation of right LE focusing  on control with hip flexion as well as hip and knee extension. Patient reports she could "feel the muscles working." Patient also worked on controlled rolling and side lying to sit and  transfers going to both right and left sides.   See FIM for current functional status  Therapy/Group: Individual Therapy  Arelia Longest M 07/17/2012, 3:27 PM

## 2012-07-17 NOTE — Progress Notes (Signed)
Nursing Note: BP this am 86/51 p-88 the patient fully awake and alert and assyptomatic.A;Dan Anguilli,PA made aware.wbb

## 2012-07-17 NOTE — Progress Notes (Addendum)
Patient ID: Margaret Hess, female   DOB: [birth year], 46 y.o.   MRN: [id number]  Subjective/Complaints:  dizziness during orthostatic check Reviewed BPs Review of Systems  Unable to perform ROS: medical condition   Objective: Vital Signs: Blood pressure 110/60, pulse 80, temperature 98.1 F (36.7 C), temperature source Oral, resp. rate 19, height 5\' 6"  (1.676 m), weight 81.6 kg (179 lb 14.3 oz), SpO2 96.00%. No results found. Results for orders placed during the hospital encounter of 06/25/12 (from the past 72 hour(s))  GLUCOSE, CAPILLARY     Status: Abnormal   Collection Time   07/14/12  4:35 PM      Component Value Range Comment   Glucose-Capillary 118 (*) 70 - 99 mg/dL   GLUCOSE, CAPILLARY     Status: Abnormal   Collection Time   07/14/12  9:00 PM      Component Value Range Comment   Glucose-Capillary 118 (*) 70 - 99 mg/dL   PROTIME-INR     Status: Abnormal   Collection Time   07/15/12  6:10 AM      Component Value Range Comment   Prothrombin Time 23.5 (*) 11.6 - 15.2 seconds    INR 2.20 (*) 0.00 - 1.49   CBC     Status: Abnormal   Collection Time   07/15/12  6:10 AM      Component Value Range Comment   WBC 9.9  4.0 - 10.5 K/uL    RBC 3.84 (*) 3.87 - 5.11 MIL/uL    Hemoglobin 11.3 (*) 12.0 - 15.0 g/dL    HCT 29.5 (*) 62.1 - 46.0 %    MCV 86.2  78.0 - 100.0 fL    MCH 29.4  26.0 - 34.0 pg    MCHC 34.1  30.0 - 36.0 g/dL    RDW 30.8  65.7 - 84.6 %    Platelets 290  150 - 400 K/uL   GLUCOSE, CAPILLARY     Status: Abnormal   Collection Time   07/15/12  7:57 AM      Component Value Range Comment   Glucose-Capillary 152 (*) 70 - 99 mg/dL    Comment 1 Documented in Chart      Comment 2 Notify RN     BASIC METABOLIC PANEL     Status: Abnormal   Collection Time   07/15/12  8:47 AM      Component Value Range Comment   Sodium 135  135 - 145 mEq/L    Potassium 4.2  3.5 - 5.1 mEq/L    Chloride 95 (*) 96 - 112 mEq/L    CO2 23  19 - 32 mEq/L    Glucose, Bld 253 (*) 70 - 99  mg/dL    BUN 21  6 - 23 mg/dL    Creatinine, Ser 9.62  0.50 - 1.10 mg/dL    Calcium 9.4  8.4 - 95.2 mg/dL    GFR calc non Af Amer >90  >90 mL/min    GFR calc Af Amer >90  >90 mL/min   GLUCOSE, CAPILLARY     Status: Abnormal   Collection Time   07/15/12  9:37 AM      Component Value Range Comment   Glucose-Capillary 231 (*) 70 - 99 mg/dL    Comment 1 Documented in Chart      Comment 2 Notify RN     GLUCOSE, CAPILLARY     Status: Abnormal   Collection Time   07/15/12 11:34 AM      Component  Value Range Comment   Glucose-Capillary 142 (*) 70 - 99 mg/dL    Comment 1 Notify RN     GLUCOSE, CAPILLARY     Status: Abnormal   Collection Time   07/15/12  4:21 PM      Component Value Range Comment   Glucose-Capillary 136 (*) 70 - 99 mg/dL   GLUCOSE, CAPILLARY     Status: Abnormal   Collection Time   07/15/12  9:34 PM      Component Value Range Comment   Glucose-Capillary 128 (*) 70 - 99 mg/dL    Comment 1 Notify RN     GLUCOSE, CAPILLARY     Status: Abnormal   Collection Time   07/16/12  7:27 AM      Component Value Range Comment   Glucose-Capillary 155 (*) 70 - 99 mg/dL   GLUCOSE, CAPILLARY     Status: Abnormal   Collection Time   07/16/12 11:39 AM      Component Value Range Comment   Glucose-Capillary 143 (*) 70 - 99 mg/dL   GLUCOSE, CAPILLARY     Status: Abnormal   Collection Time   07/16/12  4:44 PM      Component Value Range Comment   Glucose-Capillary 124 (*) 70 - 99 mg/dL   GLUCOSE, CAPILLARY     Status: Abnormal   Collection Time   07/16/12  9:22 PM      Component Value Range Comment   Glucose-Capillary 131 (*) 70 - 99 mg/dL   PROTIME-INR     Status: Abnormal   Collection Time   07/17/12  5:40 AM      Component Value Range Comment   Prothrombin Time 23.9 (*) 11.6 - 15.2 seconds    INR 2.25 (*) 0.00 - 1.49   GLUCOSE, CAPILLARY     Status: Abnormal   Collection Time   07/17/12  7:25 AM      Component Value Range Comment   Glucose-Capillary 121 (*) 70 - 99 mg/dL       HEENT: normal Cardio: RRR Resp: CTA B/L GI: BS positive Extremity:  No Edema Skin:   Intact Neuro: Abnormal Sensory absent light touch and proprio, positive deep pain, Abnormal Motor 0/5 in LUE and LLE, Abnormal FMC Tone  reduced on L side, Tone:  Hypotonia, Aphasic, Inattention and Apraxic Musc/Skel:  Swelling L dorsum hand ecchymosis and swelling-mild Increased L hamstrings and hip adductor tone--unchanged  Assessment/Plan: 1. Functional deficits secondary to R MCA infarct L HP, crossed aphasia,apraxia  which require 3+ hours per day of interdisciplinary therapy in a comprehensive inpatient rehab setting. Physiatrist is providing close team supervision and 24 hour management of active medical problems listed below. Physiatrist and rehab team continue to assess barriers to discharge/monitor patient progress toward functional and medical goals. FIM: FIM - Bathing Bathing Steps Patient Completed: Chest;Right Arm;Left Arm;Abdomen;Front perineal area;Buttocks;Right lower leg (including foot);Left upper leg;Right upper leg;Left lower leg (including foot) Bathing: 4: Steadying assist  FIM - Upper Body Dressing/Undressing Upper body dressing/undressing steps patient completed: Thread/unthread left sleeve of pullover shirt/dress;Put head through opening of pull over shirt/dress;Pull shirt over trunk;Thread/unthread right sleeve of pullover shirt/dresss Upper body dressing/undressing: 5: Supervision: Safety issues/verbal cues FIM - Lower Body Dressing/Undressing Lower body dressing/undressing steps patient completed: Thread/unthread right underwear leg;Thread/unthread left underwear leg;Pull underwear up/down;Thread/unthread right pants leg;Thread/unthread left pants leg;Pull pants up/down;Don/Doff right shoe Lower body dressing/undressing: 4: Steadying Assist  FIM - Toileting Toileting steps completed by patient: Adjust clothing prior to toileting;Performs perineal hygiene;Adjust  clothing  after toileting Toileting Assistive Devices: Grab bar or rail for support Toileting: 4: Steadying assist  FIM - Diplomatic Services operational officer Devices: Elevated toilet seat;Grab bars Toilet Transfers: 4-To toilet/BSC: Min A (steadying Pt. > 75%);4-From toilet/BSC: Min A (steadying Pt. > 75%)  FIM - Bed/Chair Transfer Bed/Chair Transfer Assistive Devices: Arm rests Bed/Chair Transfer: 0: Activity did not occur  FIM - Locomotion: Wheelchair Locomotion: Wheelchair: 2: Travels 50 - 149 ft with maximal assistance (Pt: 25 - 49%) FIM - Locomotion: Ambulation Locomotion: Ambulation Assistive Devices: TEFL teacher Ambulation/Gait Assistance: 3: Mod assist Locomotion: Ambulation: 1: Travels less than 50 ft with moderate assistance (Pt: 50 - 74%)  Comprehension Comprehension Mode: Auditory Comprehension: 5-Follows basic conversation/direction: With extra time/assistive device  Expression Expression Mode: Verbal Expression: 5-Expresses basic needs/ideas: With extra time/assistive device  Social Interaction Social Interaction Mode: Asleep Social Interaction: 6-Interacts appropriately with others with medication or extra time (anti-anxiety, antidepressant).  Problem Solving Problem Solving Mode: Asleep Problem Solving: 5-Solves basic 90% of the time/requires cueing < 10% of the time  Memory Memory Mode: Asleep Memory: 5-Recognizes or recalls 90% of the time/requires cueing < 10% of the time  Medical Problem List and Plan:  1. 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 DVT Prophylaxis/Anticoagulation: Chronic Coumadin therapy with a goal INR of 2-2.5 secondary hemorrhagic transformation  3. Neuropsych: This patient is not capable of making decisions on his/her own behalf.  4. 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  Monitor with increased mobility. Cardiology to follow up as needed(Dr. Bensimhon), TED hose when OOB Discussed with cardiology today no changes 5. Diabetes mellitus. Patient currently on sliding scale insulin. Patient on Glucophage 500 mg twice a day prior to admission and victoza 1.2 mL subcutaneously daily. Check blood sugars a.c. and at bedtime, Is on hi dose metformin 1000mg  BID with controlled CBGs.  If this fails then restart Victoza--improved control at present 6. Mood/depression. Patient on Wellbutrin prior to admission 75 mg daily. We'll discuss resuming this medication. Provide emotional support and positive reinforcement  7. 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  Back pain mild/mod will use K pad and analgesic balm  -discussed Vonna Kotyk back cushion with PT 9.  Spasticity  off Zanaflex probable cause of orthostasis, monitor consider baclofen if tone increased LOS (Days) 22 A FACE TO FACE EVALUATION WAS PERFORMED  Cristo Ausburn E 07/17/2012, 8:15 AM

## 2012-07-17 NOTE — Progress Notes (Signed)
ANTICOAGULATION CONSULT NOTE - Follow Up Consult  Pharmacy Consult for Coumadin Indication: LV thrombus/ secondary stroke prophylaxis  No Known Allergies  Patient Measurements: Height: 5\' 6"  (167.6 cm) Weight: 179 lb 14.3 oz (81.6 kg) IBW/kg (Calculated) : 59.3   Vital Signs: Temp: 98.1 F (36.7 C) (11/15 0500) Temp src: Oral (11/15 0500) BP: 116/66 mmHg (11/15 0925) Pulse Rate: 91  (11/15 0925)  Labs:  Basename 07/17/12 0540 07/15/12 0847 07/15/12 0610  HGB -- -- 11.3*  HCT -- -- 33.1*  PLT -- -- 290  APTT -- -- --  LABPROT 23.9* -- 23.5*  INR 2.25* -- 2.20*  HEPARINUNFRC -- -- --  CREATININE -- 0.70 --  CKTOTAL -- -- --  CKMB -- -- --  TROPONINI -- -- --    Estimated Creatinine Clearance: 94.6 ml/min (by C-G formula based on Cr of 0.7).  Assessment: 46 y.o. F discharged from [facility] on 10/18 on coumadin 5mg  daily and lovenox 80mg  q12 for new LV thrombus. Returns to the hospital that same night with difficulty speaking and L sided weakness. CT head negative but MRI brain showed large R-MCA that later underwent hemorrhagic transformation   Warfarin resumed per recommendations of neuro/stroke team. INR today remains therapeutic (INR 2.25 << 2.2, goal of 2-3). No CBC today, no s/sx of bleeding noted  Goal of Therapy:  INR 2-2.5 (lower goal d/t hemorrhagic transformation of CVA)   Plan:  1. Continue warfarin 2.5 mg daily 2. Will continue to monitor for any signs/symptoms of bleeding and will follow up with PT/INR on MWF  Georgina Pillion, PharmD, BCPS Clinical Pharmacist Pager: [phone] 07/17/2012 1:36 PM

## 2012-07-17 NOTE — Progress Notes (Signed)
Speech Language Pathology Daily Session Note  Patient Details  Name: Margaret Hess MRN: [id number] Date of Birth: 02/20/1966  Today's Date: 07/17/2012 Time: 6578-4696 Time Calculation (min): 60 min  Short Term Goals: Week 4: SLP Short Term Goal 1 (Week 4): Patient will verbally complete automatic sequences with supervision level assist phonemic cues. SLP Short Term Goal 2 (Week 4): Patient will expand sentences to include functors to describe/explain/comment on situations, pictures, etc. with min assist verbal and written cues. SLP Short Term Goal 3 (Week 4): Patient will self monitor accuracy of verbal expression with min assist verbal and non-verbal cues.  SLP Short Term Goal 4 (Week 4): Patient will generate written biographical information after looking at information with min assist multi-modal cues to self monitor and correct errors.  Skilled Therapeutic Interventions: Treatment session focused on addressing speech-language goals. SLP facilitated writing biographical information by copying then fading to presenting information, covering it and then having patient write info.  Patient preformed task of writing name with 1 error and cue to recognize and correct. To complete address patient required max assist verbal, visual and tactile cues.  SLP also facilitated session with verbal expression by requesting patient describe what people were doing in a task; patient produce 4-5 word sentences with mod faded to min assist cues to self-monitor and correct appropriate use of functors.  Overall, patient continues to demonstrate inconsistent performance with tasks day to day due to decreased recall, self-monitoring and carryover.  Additionally, this week patient's ability to participate, due to blood pressure issues, also impacted her ability to consistently participate.  Therefore it is recommended that at this time patient receive follow-up skilled therapy in a less intensive environment.      FIM:  Comprehension Comprehension Mode: Auditory Comprehension: 5-Follows basic conversation/direction: With extra time/assistive device Expression Expression Mode: Verbal Expression: 5-Expresses basic 90% of the time/requires cueing < 10% of the time. Social Interaction Social Interaction: 5-Interacts appropriately 90% of the time - Needs monitoring or encouragement for participation or interaction. Problem Solving Problem Solving: 4-Solves basic 75 - 89% of the time/requires cueing 10 - 24% of the time Memory Memory: 4-Recognizes or recalls 75 - 89% of the time/requires cueing 10 - 24% of the time  Pain Pain Assessment Pain Assessment: No/denies pain  Therapy/Group: Individual Therapy  Charlane Ferretti., CCC-SLP [phone]  Margaret Hess 07/17/2012, 12:28 PM

## 2012-07-17 NOTE — Progress Notes (Signed)
  Lisinopril cut back today due to orthostasis. BUN/CR ratio 30:1. Likely dry despite weight gain. Will stop lasix. Increase spiro. (this was her home regimen).  We will continue to follow. Check BMET Monday.

## 2012-07-18 ENCOUNTER — Encounter (HOSPITAL_COMMUNITY): Payer: BC Managed Care – PPO | Admitting: *Deleted

## 2012-07-18 ENCOUNTER — Inpatient Hospital Stay (HOSPITAL_COMMUNITY): Payer: BC Managed Care – PPO | Admitting: Occupational Therapy

## 2012-07-18 ENCOUNTER — Inpatient Hospital Stay (HOSPITAL_COMMUNITY): Payer: BC Managed Care – PPO | Admitting: Speech Pathology

## 2012-07-18 LAB — GLUCOSE, CAPILLARY
Glucose-Capillary: 132 mg/dL — ABNORMAL HIGH (ref 70–99)
Glucose-Capillary: 120 mg/dL — ABNORMAL HIGH (ref 70–99)

## 2012-07-18 NOTE — Progress Notes (Signed)
Patient ID: Margaret Hess, female   DOB: December 12, 1965, 46 y.o.   MRN: [id number]  Subjective/Complaints:  Appreciate cardiology assist Reviewed BPs Review of Systems  Unable to perform ROS: medical condition   Objective: Vital Signs: Blood pressure 102/70, pulse 90, temperature 98.3 F (36.8 C), temperature source Oral, resp. rate 18, height 5\' 6"  (1.676 m), weight 76.7 kg (169 lb 1.5 oz), SpO2 95.00%. No results found. Results for orders placed during the hospital encounter of 06/25/12 (from the past 72 hour(s))  BASIC METABOLIC PANEL     Status: Abnormal   Collection Time   07/15/12  8:47 AM      Component Value Range Comment   Sodium 135  135 - 145 mEq/L    Potassium 4.2  3.5 - 5.1 mEq/L    Chloride 95 (*) 96 - 112 mEq/L    CO2 23  19 - 32 mEq/L    Glucose, Bld 253 (*) 70 - 99 mg/dL    BUN 21  6 - 23 mg/dL    Creatinine, Ser 3.29  0.50 - 1.10 mg/dL    Calcium 9.4  8.4 - 51.8 mg/dL    GFR calc non Af Amer >90  >90 mL/min    GFR calc Af Amer >90  >90 mL/min   GLUCOSE, CAPILLARY     Status: Abnormal   Collection Time   07/15/12  9:37 AM      Component Value Range Comment   Glucose-Capillary 231 (*) 70 - 99 mg/dL    Comment 1 Documented in Chart      Comment 2 Notify RN     GLUCOSE, CAPILLARY     Status: Abnormal   Collection Time   07/15/12 11:34 AM      Component Value Range Comment   Glucose-Capillary 142 (*) 70 - 99 mg/dL    Comment 1 Notify RN     GLUCOSE, CAPILLARY     Status: Abnormal   Collection Time   07/15/12  4:21 PM      Component Value Range Comment   Glucose-Capillary 136 (*) 70 - 99 mg/dL   GLUCOSE, CAPILLARY     Status: Abnormal   Collection Time   07/15/12  9:34 PM      Component Value Range Comment   Glucose-Capillary 128 (*) 70 - 99 mg/dL    Comment 1 Notify RN     GLUCOSE, CAPILLARY     Status: Abnormal   Collection Time   07/16/12  7:27 AM      Component Value Range Comment   Glucose-Capillary 155 (*) 70 - 99 mg/dL   GLUCOSE, CAPILLARY      Status: Abnormal   Collection Time   07/16/12 11:39 AM      Component Value Range Comment   Glucose-Capillary 143 (*) 70 - 99 mg/dL   GLUCOSE, CAPILLARY     Status: Abnormal   Collection Time   07/16/12  4:44 PM      Component Value Range Comment   Glucose-Capillary 124 (*) 70 - 99 mg/dL   GLUCOSE, CAPILLARY     Status: Abnormal   Collection Time   07/16/12  9:22 PM      Component Value Range Comment   Glucose-Capillary 131 (*) 70 - 99 mg/dL   PROTIME-INR     Status: Abnormal   Collection Time   07/17/12  5:40 AM      Component Value Range Comment   Prothrombin Time 23.9 (*) 11.6 - 15.2 seconds  INR 2.25 (*) 0.00 - 1.49   GLUCOSE, CAPILLARY     Status: Abnormal   Collection Time   07/17/12  7:25 AM      Component Value Range Comment   Glucose-Capillary 121 (*) 70 - 99 mg/dL   GLUCOSE, CAPILLARY     Status: Abnormal   Collection Time   07/17/12 11:39 AM      Component Value Range Comment   Glucose-Capillary 154 (*) 70 - 99 mg/dL   GLUCOSE, CAPILLARY     Status: Abnormal   Collection Time   07/17/12  4:36 PM      Component Value Range Comment   Glucose-Capillary 144 (*) 70 - 99 mg/dL   GLUCOSE, CAPILLARY     Status: Abnormal   Collection Time   07/17/12  9:25 PM      Component Value Range Comment   Glucose-Capillary 130 (*) 70 - 99 mg/dL   GLUCOSE, CAPILLARY     Status: Abnormal   Collection Time   07/18/12  7:24 AM      Component Value Range Comment   Glucose-Capillary 134 (*) 70 - 99 mg/dL    Comment 1 Notify RN        HEENT: normal Cardio: RRR Resp: CTA B/L GI: BS positive Extremity:  No Edema Skin:   Intact Neuro: Abnormal Sensory absent light touch and proprio, positive deep pain, Abnormal Motor 0/5 in LUE and LLE, Abnormal FMC Tone  reduced on L side, Tone:  Hypotonia, Aphasic, Inattention and Apraxic Musc/Skel:  Swelling L dorsum hand ecchymosis and swelling-mild Increased L hamstrings and hip adductor tone--unchanged  Assessment/Plan: 1. Functional  deficits secondary to R MCA infarct L HP, crossed aphasia,apraxia  which require 3+ hours per day of interdisciplinary therapy in a comprehensive inpatient rehab setting. Physiatrist is providing close team supervision and 24 hour management of active medical problems listed below. Physiatrist and rehab team continue to assess barriers to discharge/monitor patient progress toward functional and medical goals. FIM: FIM - Bathing Bathing Steps Patient Completed: Chest;Right Arm;Left Arm;Abdomen;Front perineal area;Buttocks;Right lower leg (including foot);Left upper leg;Right upper leg;Left lower leg (including foot) Bathing: 4: Steadying assist  FIM - Upper Body Dressing/Undressing Upper body dressing/undressing steps patient completed: Thread/unthread left sleeve of pullover shirt/dress;Put head through opening of pull over shirt/dress;Pull shirt over trunk;Thread/unthread right sleeve of pullover shirt/dresss Upper body dressing/undressing: 5: Supervision: Safety issues/verbal cues FIM - Lower Body Dressing/Undressing Lower body dressing/undressing steps patient completed: Thread/unthread right underwear leg;Thread/unthread left underwear leg;Pull underwear up/down;Thread/unthread right pants leg;Thread/unthread left pants leg;Pull pants up/down;Don/Doff right shoe Lower body dressing/undressing: 4: Steadying Assist  FIM - Toileting Toileting steps completed by patient: Adjust clothing prior to toileting;Performs perineal hygiene;Adjust clothing after toileting Toileting Assistive Devices: Grab bar or rail for support Toileting: 4: Steadying assist  FIM - Diplomatic Services operational officer Devices: Grab bars Toilet Transfers: 3-From toilet/BSC: Mod A (lift or lower assist);3-To toilet/BSC: Mod A (lift or lower assist)  FIM - Bed/Chair Transfer Bed/Chair Transfer Assistive Devices: HOB elevated;Arm rests;Bed rails Bed/Chair Transfer: 4: Supine > Sit: Min A (steadying Pt. > 75%/lift  1 leg);5: Sit > Supine: Supervision (verbal cues/safety issues);4: Bed > Chair or W/C: Min A (steadying Pt. > 75%);4: Chair or W/C > Bed: Min A (steadying Pt. > 75%)  FIM - Locomotion: Wheelchair Distance: 60 feet Locomotion: Wheelchair: 2: Travels 50 - 149 ft with moderate assistance (Pt: 50 - 74%) FIM - Locomotion: Ambulation Locomotion: Ambulation Assistive Devices: TEFL teacher;Walker - Rolling Ambulation/Gait  Assistance: 3: Mod assist Locomotion: Ambulation: 2: Travels 50 - 149 ft with moderate assistance (Pt: 50 - 74%)  Comprehension Comprehension Mode: Auditory Comprehension: 5-Follows basic conversation/direction: With no assist  Expression Expression Mode: Verbal Expression: 5-Expresses basic needs/ideas: With extra time/assistive device  Social Interaction Social Interaction Mode: Asleep Social Interaction: 6-Interacts appropriately with others with medication or extra time (anti-anxiety, antidepressant).  Problem Solving Problem Solving Mode: Asleep Problem Solving: 5-Solves basic 90% of the time/requires cueing < 10% of the time  Memory Memory Mode: Asleep Memory: 5-Recognizes or recalls 90% of the time/requires cueing < 10% of the time  Medical Problem List and Plan:  1. 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 DVT Prophylaxis/Anticoagulation: Chronic Coumadin therapy with a goal INR of 2-2.5 secondary hemorrhagic transformation  3. Neuropsych: This patient is not capable of making decisions on his/her own behalf.  4. 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 Monitor with increased mobility. Cardiology to follow up as needed(Dr. Bensimhon), TED hose when OOB Discussed with cardiology today no changes 5. Diabetes mellitus. Patient currently on sliding scale insulin. Patient on Glucophage 500 mg twice a day prior  to admission and victoza 1.2 mL subcutaneously daily. Check blood sugars a.c. and at bedtime, Is on hi dose metformin 1000mg  BID with controlled CBGs.  If this fails then restart Victoza--improved control at present 6. Mood/depression. Patient on Wellbutrin prior to admission 75 mg daily. We'll discuss resuming this medication. Provide emotional support and positive reinforcement  7. 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  Back pain mild/mod will use K pad and analgesic balm  -discussed Vonna Kotyk back cushion with PT 9.  Spasticity  off Zanaflex probable cause of orthostasis, monitor consider baclofen if tone increased LOS (Days) 23 A FACE TO FACE EVALUATION WAS PERFORMED  KIRSTEINS,ANDREW E 07/18/2012, 8:45 AM

## 2012-07-19 ENCOUNTER — Encounter (HOSPITAL_COMMUNITY): Payer: BC Managed Care – PPO | Admitting: *Deleted

## 2012-07-19 LAB — GLUCOSE, CAPILLARY
Glucose-Capillary: 143 mg/dL — ABNORMAL HIGH (ref 70–99)
Glucose-Capillary: 140 mg/dL — ABNORMAL HIGH (ref 70–99)
Glucose-Capillary: 103 mg/dL — ABNORMAL HIGH (ref 70–99)

## 2012-07-19 NOTE — Progress Notes (Signed)
Patient ID: Margaret Hess, female   DOB: 09/08/1965, 46 y.o.   MRN: [id number]  Subjective/Complaints: Mild nerve pain on L side L arm spasms upon awakening Reviewed BPs Review of Systems  Unable to perform ROS: medical condition   Objective: Vital Signs: Blood pressure 110/72, pulse 84, temperature 98.3 F (36.8 C), temperature source Oral, resp. rate 18, height 5\' 6"  (1.676 m), weight 76.7 kg (169 lb 1.5 oz), SpO2 97.00%. No results found. Results for orders placed during the hospital encounter of 06/25/12 (from the past 72 hour(s))  GLUCOSE, CAPILLARY     Status: Abnormal   Collection Time   07/16/12 11:39 AM      Component Value Range Comment   Glucose-Capillary 143 (*) 70 - 99 mg/dL   GLUCOSE, CAPILLARY     Status: Abnormal   Collection Time   07/16/12  4:44 PM      Component Value Range Comment   Glucose-Capillary 124 (*) 70 - 99 mg/dL   GLUCOSE, CAPILLARY     Status: Abnormal   Collection Time   07/16/12  9:22 PM      Component Value Range Comment   Glucose-Capillary 131 (*) 70 - 99 mg/dL   PROTIME-INR     Status: Abnormal   Collection Time   07/17/12  5:40 AM      Component Value Range Comment   Prothrombin Time 23.9 (*) 11.6 - 15.2 seconds    INR 2.25 (*) 0.00 - 1.49   GLUCOSE, CAPILLARY     Status: Abnormal   Collection Time   07/17/12  7:25 AM      Component Value Range Comment   Glucose-Capillary 121 (*) 70 - 99 mg/dL   GLUCOSE, CAPILLARY     Status: Abnormal   Collection Time   07/17/12 11:39 AM      Component Value Range Comment   Glucose-Capillary 154 (*) 70 - 99 mg/dL   GLUCOSE, CAPILLARY     Status: Abnormal   Collection Time   07/17/12  4:36 PM      Component Value Range Comment   Glucose-Capillary 144 (*) 70 - 99 mg/dL   GLUCOSE, CAPILLARY     Status: Abnormal   Collection Time   07/17/12  9:25 PM      Component Value Range Comment   Glucose-Capillary 130 (*) 70 - 99 mg/dL   GLUCOSE, CAPILLARY     Status: Abnormal   Collection Time   07/18/12  7:24 AM      Component Value Range Comment   Glucose-Capillary 134 (*) 70 - 99 mg/dL    Comment 1 Notify RN     GLUCOSE, CAPILLARY     Status: Abnormal   Collection Time   07/18/12 11:50 AM      Component Value Range Comment   Glucose-Capillary 132 (*) 70 - 99 mg/dL   GLUCOSE, CAPILLARY     Status: Abnormal   Collection Time   07/18/12  4:29 PM      Component Value Range Comment   Glucose-Capillary 120 (*) 70 - 99 mg/dL   GLUCOSE, CAPILLARY     Status: Abnormal   Collection Time   07/18/12  9:14 PM      Component Value Range Comment   Glucose-Capillary 155 (*) 70 - 99 mg/dL    Comment 1 Notify RN     GLUCOSE, CAPILLARY     Status: Abnormal   Collection Time   07/19/12  7:21 AM      Component Value Range  Comment   Glucose-Capillary 143 (*) 70 - 99 mg/dL      HEENT: normal Cardio: RRR Resp: CTA B/L GI: BS positive Extremity:  No Edema Skin:   Intact Neuro: Abnormal Sensory absent light touch and proprio, positive deep pain, Abnormal Motor 0/5 in LUE and LLE, Abnormal FMC Tone  reduced on L side, Tone:  Hypotonia, Aphasic, Inattention and Apraxic Musc/Skel:  Swelling L dorsum hand ecchymosis and swelling-mild Increased L hamstrings and hip adductor tone--unchanged  Assessment/Plan: 1. Functional deficits secondary to R MCA infarct L HP, crossed aphasia,apraxia  which require 3+ hours per day of interdisciplinary therapy in a comprehensive inpatient rehab setting. Physiatrist is providing close team supervision and 24 hour management of active medical problems listed below. Physiatrist and rehab team continue to assess barriers to discharge/monitor patient progress toward functional and medical goals. FIM: FIM - Bathing Bathing Steps Patient Completed: Chest;Right Arm;Left Arm;Abdomen;Front perineal area;Buttocks;Right lower leg (including foot);Left upper leg;Right upper leg;Left lower leg (including foot) Bathing: 4: Steadying assist  FIM - Upper Body  Dressing/Undressing Upper body dressing/undressing steps patient completed: Thread/unthread left sleeve of pullover shirt/dress;Put head through opening of pull over shirt/dress;Pull shirt over trunk;Thread/unthread right sleeve of pullover shirt/dresss Upper body dressing/undressing: 5: Supervision: Safety issues/verbal cues FIM - Lower Body Dressing/Undressing Lower body dressing/undressing steps patient completed: Thread/unthread right underwear leg;Thread/unthread left underwear leg;Pull underwear up/down;Thread/unthread right pants leg;Thread/unthread left pants leg;Pull pants up/down;Don/Doff right shoe Lower body dressing/undressing: 4: Steadying Assist  FIM - Toileting Toileting steps completed by patient: Adjust clothing prior to toileting;Performs perineal hygiene;Adjust clothing after toileting Toileting Assistive Devices: Grab bar or rail for support Toileting: 4: Steadying assist  FIM - Diplomatic Services operational officer Devices: Grab bars Toilet Transfers: 3-From toilet/BSC: Mod A (lift or lower assist);3-To toilet/BSC: Mod A (lift or lower assist)  FIM - Bed/Chair Transfer Bed/Chair Transfer Assistive Devices: HOB elevated;Arm rests;Bed rails Bed/Chair Transfer: 4: Supine > Sit: Min A (steadying Pt. > 75%/lift 1 leg);5: Sit > Supine: Supervision (verbal cues/safety issues);4: Bed > Chair or W/C: Min A (steadying Pt. > 75%);4: Chair or W/C > Bed: Min A (steadying Pt. > 75%)  FIM - Locomotion: Wheelchair Distance: 60 feet Locomotion: Wheelchair: 2: Travels 50 - 149 ft with moderate assistance (Pt: 50 - 74%) FIM - Locomotion: Ambulation Locomotion: Ambulation Assistive Devices: TEFL teacher;Walker - Rolling Ambulation/Gait Assistance: 3: Mod assist Locomotion: Ambulation: 2: Travels 50 - 149 ft with moderate assistance (Pt: 50 - 74%)  Comprehension Comprehension Mode: Auditory Comprehension: 5-Follows basic conversation/direction: With no  assist  Expression Expression Mode: Verbal Expression: 5-Expresses basic needs/ideas: With extra time/assistive device  Social Interaction Social Interaction Mode: Asleep Social Interaction: 6-Interacts appropriately with others with medication or extra time (anti-anxiety, antidepressant).  Problem Solving Problem Solving Mode: Asleep Problem Solving: 5-Solves basic 90% of the time/requires cueing < 10% of the time  Memory Memory Mode: Asleep Memory: 5-Recognizes or recalls 90% of the time/requires cueing < 10% of the time  Medical Problem List and Plan:  1. 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 DVT Prophylaxis/Anticoagulation: Chronic Coumadin therapy with a goal INR of 2-2.5 secondary hemorrhagic transformation  3. Neuropsych: This patient is not capable of making decisions on his/her own behalf.  4. 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 Monitor with increased mobility. Cardiology to follow up as needed(Dr. Bensimhon), TED hose when OOB Discussed  with cardiology today no changes 5. Diabetes mellitus. Patient currently on sliding scale insulin. Patient on Glucophage 500 mg twice a day prior to admission and victoza 1.2 mL subcutaneously daily. Check blood sugars a.c. and at bedtime, Is on hi dose metformin 1000mg  BID with controlled CBGs.  If this fails then restart Victoza--improved control at present 6. Mood/depression. Patient on Wellbutrin prior to admission 75 mg daily. We'll discuss resuming this medication. Provide emotional support and positive reinforcement  7. 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  Back pain mild/mod will use K pad and analgesic balm  -discussed Vonna Kotyk back  cushion with PT 9.  Spasticity  off Zanaflex probable cause of orthostasis, monitor consider baclofen if tone increased LOS (Days) 24 A FACE TO FACE EVALUATION WAS PERFORMED  KIRSTEINS,ANDREW E 07/19/2012, 8:26 AM

## 2012-07-19 NOTE — Progress Notes (Signed)
Occupational Therapy Note  Patient Details  Name: Margaret Hess MRN: [id number] Date of Birth: 11-17-1965 Today's Date: 07/19/2012  Time:  1030-1120  (50 min) Pain:  None Individual therapy  Performed bed mobility, transfers, sit to stand, dynamic sitting balance, static standing balance during bathing and dressing session.  Provided pt manual cues to sit EOB.  Transferred to wc, to toilet,  to shower bench.  Pt. Able to stand in shower with moderate assist for balance while washing peri area for 1 minute.  Pt. transferred to wc and sat at sink for dressing.  Needed manual cues for crossing legs to don socks.  Brushed teeth with min set up.  Transferred to recliner with minimal assist and left here at end of session.      [name], Avina 07/19/2012, 12:47 PM

## 2012-07-20 ENCOUNTER — Inpatient Hospital Stay (HOSPITAL_COMMUNITY): Payer: BC Managed Care – PPO | Admitting: Physical Therapy

## 2012-07-20 ENCOUNTER — Inpatient Hospital Stay (HOSPITAL_COMMUNITY): Payer: BC Managed Care – PPO

## 2012-07-20 ENCOUNTER — Encounter (HOSPITAL_COMMUNITY): Payer: BC Managed Care – PPO

## 2012-07-20 ENCOUNTER — Inpatient Hospital Stay (HOSPITAL_COMMUNITY): Payer: BC Managed Care – PPO | Admitting: Speech Pathology

## 2012-07-20 ENCOUNTER — Inpatient Hospital Stay (HOSPITAL_COMMUNITY): Payer: BC Managed Care – PPO | Admitting: Occupational Therapy

## 2012-07-20 DIAGNOSIS — I69920 Aphasia following unspecified cerebrovascular disease: Secondary | ICD-10-CM

## 2012-07-20 DIAGNOSIS — E119 Type 2 diabetes mellitus without complications: Secondary | ICD-10-CM

## 2012-07-20 DIAGNOSIS — I69991 Dysphagia following unspecified cerebrovascular disease: Secondary | ICD-10-CM

## 2012-07-20 DIAGNOSIS — G8110 Spastic hemiplegia affecting unspecified side: Secondary | ICD-10-CM

## 2012-07-20 LAB — GLUCOSE, CAPILLARY
Glucose-Capillary: 150 mg/dL — ABNORMAL HIGH (ref 70–99)
Glucose-Capillary: 180 mg/dL — ABNORMAL HIGH (ref 70–99)
Glucose-Capillary: 104 mg/dL — ABNORMAL HIGH (ref 70–99)
Glucose-Capillary: 139 mg/dL — ABNORMAL HIGH (ref 70–99)

## 2012-07-20 LAB — URINALYSIS, ROUTINE W REFLEX MICROSCOPIC
Ketones, ur: 15 mg/dL — AB
Protein, ur: NEGATIVE mg/dL
Urobilinogen, UA: 1 mg/dL (ref 0.0–1.0)

## 2012-07-20 LAB — BASIC METABOLIC PANEL
BUN: 14 mg/dL (ref 6–23)
Chloride: 99 mEq/L (ref 96–112)
GFR calc Af Amer: >90 mL/min (ref >90)
Potassium: 4.5 mEq/L (ref 3.5–5.1)

## 2012-07-20 LAB — PROTIME-INR: Prothrombin Time: 24.9 seconds — ABNORMAL HIGH (ref 11.6–15.2)

## 2012-07-20 NOTE — Progress Notes (Signed)
Occupational Therapy Session Note  Patient Details  Name: Zell Doucette MRN: [id number] Date of Birth: 1966/04/03  Today's Date: 07/20/2012 Time: 0804-0900 Time Calculation (min): 56 min  Short Term Goals: Week 1:  OT Short Term Goal 1 (Week 1): Pt will maintain dynamic sitting balance with mod assist during selfcare activities. OT Short Term Goal 1 - Progress (Week 1): Met OT Short Term Goal 2 (Week 1): Pt will position the LUE prior and after transitional movements with mod questioning cues. OT Short Term Goal 2 - Progress (Week 1): Met OT Short Term Goal 3 (Week 1): Pt will perform all bathing with mod assist sit to stand. OT Short Term Goal 3 - Progress (Week 1): Met OT Short Term Goal 4 (Week 1): Pt will perform UB dressing with min assist to donn pullover shirt. OT Short Term Goal 4 - Progress (Week 1): Met OT Short Term Goal 5 (Week 1): Pt will perform toilet transfers with mod assist to 3:1. Week 2:  OT Short Term Goal 1 (Week 2): Pt will be able to stand at sink with min assist to allow her to pull up her pants with min assist. OT Short Term Goal 1 - Progress (Week 2): Met OT Short Term Goal 2 (Week 2): Pt will bathe in shower with steady assist using long sponge. OT Short Term Goal 2 - Progress (Week 2): Partly met OT Short Term Goal 3 (Week 2): Pt will perform self ROM to LUE with min verbal cues. OT Short Term Goal 3 - Progress (Week 2): Met OT Short Term Goal 4 (Week 2): Pt will transfer to toilet with min assist. OT Short Term Goal 4 - Progress (Week 2): Met Week 3:  OT Short Term Goal 1 (Week 3): Pt will be able to transfer to toilet with steady assist. OT Short Term Goal 1 - Progress (Week 3): Met OT Short Term Goal 2 (Week 3): Pt will be able to bathe with steady assist. OT Short Term Goal 2 - Progress (Week 3): Met OT Short Term Goal 3 (Week 3): Pt will be able to pull pants over hips with steady assist. OT Short Term Goal 3 - Progress (Week 3): Met Week 4:  OT  Short Term Goal 1 (Week 4): Pt will be able to perform stand pivot transfers to toilet with min assist (versus squat pivot). OT Short Term Goal 1 - Progress (Week 4): Progressing toward goal OT Short Term Goal 2 (Week 4): Pt will demonstrate improved static standing balance by standing at sink for 30 seconds with supervision. OT Short Term Goal 2 - Progress (Week 4): Progressing toward goal OT Short Term Goal 3 (Week 4): Pt will be able to don shirt with only 2-3 verbal cues versus moderate verbal cues. OT Short Term Goal 3 - Progress (Week 4): Progressing toward goal  Skilled Therapeutic Interventions/Progress Updates:      Pt seen for BADL retraining of toileting, bathing, and dressing with a focus on stand pivot transfers, sit to stand in midline, and LUE management.  Pt did not complain of dizziness today with sit to stand, just some fatigue. She also only needed 1 cue today to pick up her arm.  She also needed fewer cues with donning clothing.  She stood in shower to wash bottom with steady assist.  At sink with donning clothing she was able to adjust pants over hips with steady assist.  Her standing balance was more controlled, she also came into standing  maintaining her trunk in midline with min cues.  Therapy Documentation Precautions:  Precautions Precautions: Fall Precaution Comments: aphasic; LLE flexor withdrawal at times Restrictions Weight Bearing Restrictions: No   Pain: Pain Assessment Pain Assessment:  (c/o neuropathic pain in LUE/LLE) ADL:  See FIM for current functional status  Therapy/Group: Individual Therapy  SAGUIER,JULIA 07/20/2012, 9:22 AM

## 2012-07-20 NOTE — Progress Notes (Signed)
Speech Language Pathology Daily Session Note  Patient Details  Name: Margaret Hess MRN: [id number] Date of Birth: 08/01/1966  Today's Date: 07/20/2012 Time: 1000-1100 Time Calculation (min): 60 min  Short Term Goals: Week 4: SLP Short Term Goal 1 (Week 4): Patient will verbally complete automatic sequences with supervision level assist phonemic cues. SLP Short Term Goal 2 (Week 4): Patient will expand sentences to include functors to describe/explain/comment on situations, pictures, etc. with min assist verbal and written cues. SLP Short Term Goal 3 (Week 4): Patient will self monitor accuracy of verbal expression with min assist verbal and non-verbal cues.  SLP Short Term Goal 4 (Week 4): Patient will generate written biographical information after looking at information with min assist multi-modal cues to self monitor and correct errors.  Skilled Therapeutic Interventions: Treatment session targeted speech-language goals.  SLP facilitated writing biographical information (e.g. name and address) by presenting information, covering it, and then having patient write information.  Patient exhibited more difficulty with numbers than letters; however, mod assist verbal cues of dictation at the letter level were helpful in improving patient's accuracy of written expression.  SLP also facilitated session with min assist verbal and visual cues for grammatically complete utterances (including functors) in a picture description task.  Patient generated 4-6 word utterances with written visual aid of fill in the blank sentences to include helping verbs and articles.  Patient self-monitored and corrected written and verbal expression with overall mod faded to min assist verbal cues.     FIM:  Comprehension Comprehension Mode: Auditory Comprehension: 5-Understands basic 90% of the time/requires cueing < 10% of the time Expression Expression Mode: Verbal Expression: 4-Expresses basic 75 - 89% of the  time/requires cueing 10 - 24% of the time. Needs helper to occlude trach/needs to repeat words. Social Interaction Social Interaction: 6-Interacts appropriately with others with medication or extra time (anti-anxiety, antidepressant). Problem Solving Problem Solving: 5-Solves basic 90% of the time/requires cueing < 10% of the time Memory Memory: 5-Recognizes or recalls 90% of the time/requires cueing < 10% of the time  Pain Pain Assessment Pain Assessment: No/denies pain  Therapy/Group: Individual Therapy  Jackalyn Lombard, Conrad Momence  Graduate Clinician Speech Language Pathology  Armin Yerger, Joni Reining 07/20/2012, 12:30 PM

## 2012-07-20 NOTE — Progress Notes (Signed)
Physical Therapy Session Note  Patient Details  Name: Margaret Hess MRN: [id number] Date of Birth: [birth year]  Today's Date: 07/20/2012 Time: 4098-1191 Time Calculation (min): 30 min  Short Term Goals: Week 4: PT Short Term Goal 1 (Week 4): Patient will perform supine to sit on mat with verbal cueing.  PT Short Term Goal 2 (Week 4): Patient will perform basic transfer with supervision.  PT Short Term Goal 3 (Week 4): Patient will ambulate 80 feet on level tile with rolling platform walker and minimal assist.  PT Short Term Goal 4 (Week 4): Patient will propel wheelchair 100 feet in controlled environment using right extremities.   Skilled Therapeutic Interventions/Progress Updates:   W/c> mat to R with min assist for squat pivot. neuromuscular re-education via demo, tactile and VCs for:  -components of bed mobility- lower trunk rotation, bil bridging,using strap to hold knees together, dycem under L foot;    L active assistive unilateral bridging; pt required 2 visual/VCs to remember sequence of rolling R before sitting up -gait with L PFRW, L [place]ard AFO x 25' with mod assist, focusing on L stance stability, feet within RW, = step length; gait distance limited by time constraints rather than fatigue  Therapy Documentation Precautions:  Precautions Precautions: Fall Precaution Comments: aphasic; LLE flexor withdrawal at times Restrictions Weight Bearing Restrictions: No   Pain: Pain Assessment Pain Assessment: No/denies pain   Locomotion : Ambulation Ambulation/Gait Assistance: 3: Mod assist       See FIM for current functional status  Therapy/Group: Individual Therapy  Arron Tetrault 07/20/2012, 12:56 PM

## 2012-07-20 NOTE — Progress Notes (Signed)
Advanced Heart Failure Rounding Note   Subjective:    46 yo woman with history of DM,  HTN, NICM,  EF <20%.  LV thrombus, and Grade 3 diastolic dysfunction.  Mod TR.   Discharged from [facility] 06/19/12 after being treated for acute systolic heart failure and LV thrombus. Discharged on loveneox and coumadin.   Presented to ED 06/20/12 with difficulty speaking and L sided weakness. CT of head negative. 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 She continues to work with therapy.    07/07/12 ECHO EF 20-25% grade II diastolic dysfunction  Last week lasix stopped and lisinopril reduced to to 2.5 mg daily due to orthostatic hypotension. Spironolactone increased to 25 mg daily.   Denies SOB/PND/ Orthopnea.     Objective:     Vital Signs:   Temp:  [98.3 F (36.8 C)-98.9 F (37.2 C)] 98.9 F (37.2 C) (11/18 0500) Pulse Rate:  [83-92] 92  (11/18 0505) Resp:  [18] 18  (11/18 0500) BP: (110-117)/(72-76) 117/76 mmHg (11/18 0505) SpO2:  [96 %-98 %] 96 % (11/18 0500) Weight:  [166 lb 14.2 oz (75.7 kg)] 166 lb 14.2 oz (75.7 kg) (11/18 0500) Last BM Date: 07/19/12  Weight change: Filed Weights   07/18/12 0654 07/19/12 0641 07/20/12 0500  Weight: 169 lb 1.5 oz (76.7 kg) 169 lb 1.5 oz (76.7 kg) 166 lb 14.2 oz (75.7 kg)    Intake/Output:   Intake/Output Summary (Last 24 hours) at 07/20/12 0746 Last data filed at 07/19/12 0842  Gross per 24 hour  Intake    240 ml  Output      0 ml  Net    240 ml     Physical Exam:            General:  Well appearing. No resp difficulty sitting in therapy HEENT: normal Neck: supple. JVP flat.   Carotids 2+ bilat; no bruits. No lymphadenopathy or thryomegaly appreciated. Cor: PMI nondisplaced. Regular.  No gallops, rubs, or murmurs. Lungs: clear Abdomen: soft, nontender, nondistended. No hepatosplenomegaly. No  bruits or masses. Good bowel sounds. Extremities: no cyanosis, clubbing, rash, edema L arm flaccid. Rand LLE ted hose in place Neuro: still with expressive aphasia. Writing things down. Weak on L    Labs: Basic Metabolic Panel:  Lab 07/20/12 4540 07/15/12 0847  NA 138 135  K 4.5 4.2  CL 99 95*  CO2 30 23  GLUCOSE 145* 253*  BUN 14 21  CREATININE 0.68 0.70  CALCIUM 9.4 9.4  MG -- --  PHOS -- --    Liver Function Tests: No results found for this basename: AST:5,ALT:5,ALKPHOS:5,BILITOT:5,PROT:5,ALBUMIN:5 in the last 168 hours No results found for this basename: LIPASE:5,AMYLASE:5 in the last 168 hours No results found for this basename: AMMONIA:3 in the last 168 hours  CBC:  Lab 07/15/12 0610  WBC 9.9  NEUTROABS --  HGB 11.3*  HCT 33.1*  MCV 86.2  PLT 290    Cardiac Enzymes: No results found for this basename: CKTOTAL:5,CKMB:5,CKMBINDEX:5,TROPONINI:5 in the last 168 hours  BNP: BNP (last 3 results)  Basename 07/20/12 0600 06/17/12 0455 06/15/12 1720  PROBNP 891.1* 1930.0* 6932.0*       Imaging: No results found.   Medications:     Scheduled Medications:    . carvedilol  6.25 mg Oral BID WC  . digoxin  0.125 mg Oral Daily  . gabapentin  600  mg Oral TID  . insulin aspart  0-15 Units Subcutaneous TID WC  . lisinopril  2.5 mg Oral Daily  . metFORMIN  1,000 mg Oral BID WC  . pantoprazole  40 mg Oral QHS  . polyethylene glycol  17 g Oral Once  . spironolactone  25 mg Oral Daily  . warfarin  2.5 mg Oral q1800  . Warfarin - Pharmacist Dosing Inpatient   Does not apply q1800    Infusions:    PRN Medications: acetaminophen, albuterol, Muscle Rub, ondansetron (ZOFRAN) IV, ondansetron, polyethylene glycol, sorbitol, traZODone   Assessment:   1. CVA, acute hemorrhagic R MCA    --c/b expressive aphasia and L-sided hemiparesis 2. 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 NICM, EF <20% 4.  LV thrombus  5. DM   6.  Hypertension  Plan/Discussion:    Volume status stable. Continue Spironolactone 25 mg daily. Potassium stable. Renal Function. Pro BNP down to 891. Continue current HF medications. Once discharged from rehab she will need follow up in HF clinic within 7 days.   Continue coumadin for LV thrombus although no longer visible on echo.  No bleeding. INR 2.38  CLEGG,AMY, NP-C 7:46 AM  Patient seen and examined with Tonye Becket, NP. We discussed all aspects of the encounter. I agree with the assessment and plan as stated above.   Stable from a HF standpoint. BP more stable. Still with significant speech and motor deficits. Appreciate all of Rehab's efforts.   Abdikadir Fohl,MD 2:26 PM

## 2012-07-20 NOTE — Progress Notes (Signed)
Physical Therapy Session Note  Patient Details  Name: Margaret Hess MRN: [id number] Date of Birth: 03/26/1966  Today's Date: 07/20/2012 Time: 4098-1191 Time Calculation (min): 45 min  Short Term Goals: Week 4:  PT Short Term Goal 1 (Week 4): Patient will perform supine to sit on mat with verbal cueing.  PT Short Term Goal 2 (Week 4): Patient will perform basic transfer with supervision. PT Short Term Goal 3 (Week 4): Patient will ambulate 80 feet on level tile with rolling platform walker and minimal assist. PT Short Term Goal 4 (Week 4): Patient will propel wheelchair 100 feet in controlled environment using right extremities.  Skilled Therapeutic Interventions/Progress Updates:    This session focused on WC mobility from her room to the gym mod assist.  Pt kept running into objects on her left side and became very frustrated with trying to steer the [facility], PT provided moderate assist to steer to the gym and pt was able to help propel with right upper and lower extremity.  WC to mat table on pt's right min guard assist stand pivot.  Sit to supine supervision with verbal cues to loop her left leg with her right to get it onto the table.  Table there-ex: bridges double and single leg bridges bil x 10, hip abduction L AAROM x 10, SLR left AAROM x 10.  Sit to supine mod assist to help pt boost her trunk up from her right side and to keep left arm in her lap, cues for progression of both legs over the side of the bed.  Gait with RW mod assist 25' x 2 (assist needed especially while turning or backing up due to increased unsteadiness and LOB/difficulty extending hip and lifting leg at the same time while lifting and managing LPFRW). Therapist provided manual assist at left knee to prevent hyperextension moment while ensuring the left knee would not buckle when slightly flexed, manual assist also needed to steer the RW.  Blue rocker AFO on her left foot to assist with foot control during gait.    Therapy  Documentation Precautions:  Precautions Precautions: Fall Precaution Comments: aphasic; LLE flexor withdrawal at times Restrictions Weight Bearing Restrictions: No General:   Vital Signs: Therapy Vitals Temp: 98 F (36.7 C) Temp src: Oral Pulse Rate: 83  Resp: 18  BP: 111/70 mmHg Patient Position, if appropriate: Lying Oxygen Therapy SpO2: 98 %    See FIM for current functional status  Therapy/Group: Individual Therapy  Ariel Dimitri, Claretta Fraise 07/20/2012, 5:00 PM

## 2012-07-20 NOTE — Progress Notes (Signed)
The skilled treatment note has been reviewed and SLP is in agreement. Audreana Hancox, M.A., CCC-SLP [phone]  

## 2012-07-20 NOTE — Progress Notes (Signed)
Social Work Patient ID: Margaret Hess, female   DOB: 1966-07-01, 46 y.o.   MRN: [id number] Met with pt to inform of two facilities who are interested in taking her.  [facility] and [facility] in [place].  She will talk with friends and sister And get back with worker.  She would like a private room and [facility] could offer this.  Pt's blood pressure more stable and she feels better.

## 2012-07-20 NOTE — Progress Notes (Signed)
ANTICOAGULATION CONSULT NOTE - Follow Up Consult  Pharmacy Consult for Coumadin Indication: LV thrombus/ secondary stroke prophylaxis  No Known Allergies  Patient Measurements: Height: 5\' 6"  (167.6 cm) Weight: 166 lb 14.2 oz (75.7 kg) IBW/kg (Calculated) : 59.3   Vital Signs: Temp: 98.9 F (37.2 C) (11/18 0500) Temp src: Oral (11/18 0500) BP: 117/76 mmHg (11/18 0505) Pulse Rate: 92  (11/18 0505)  Labs:  Basename 07/20/12 0600  HGB --  HCT --  PLT --  APTT --  LABPROT 24.9*  INR 2.38*  HEPARINUNFRC --  CREATININE 0.68  CKTOTAL --  CKMB --  TROPONINI --    Estimated Creatinine Clearance: 91.4 ml/min (by C-G formula based on Cr of 0.68).  Assessment: 46 y.o. F discharged [facility] on 10/18 on coumadin 5mg  daily and lovenox 80mg  q12 for new LV thrombus. Returns to the hospital that same night with difficulty speaking and L sided weakness. CT head negative but MRI brain showed large R-MCA that later underwent hemorrhagic transformation   Warfarin resumed per recommendations of neuro/stroke team. INR today remains therapeutic (INR 2.25 << 2.2, goal of 2-3). No CBC today, no s/sx of bleeding noted  Goal of Therapy:  INR 2-2.5 (lower goal d/t hemorrhagic transformation of CVA)   Plan:  1. Continue warfarin 2.5 mg daily 2. Will continue to monitor for any signs/symptoms of bleeding and will follow up with PT/INR on MWF   Thank you for allowing pharmacy to be a part of this patients care team.  Lovenia Kim Pharm.D., BCPS Clinical Pharmacist 07/20/2012 9:25 AM Pager: [phone] Phone: [phone]

## 2012-07-20 NOTE — Progress Notes (Signed)
Patient ID: Margaret Hess, female   DOB: [birth year], 46 y.o.   MRN: [id number] Burning with urination RN notes strong odor Subjective/Complaints: Moderate nerve pain on L side L arm spasms upon awakening Reviewed BPs Review of Systems  Unable to perform ROS: medical condition   Objective: Vital Signs: Blood pressure 117/76, pulse 92, temperature 98.9 F (37.2 C), temperature source Oral, resp. rate 18, height 5\' 6"  (1.676 m), weight 75.7 kg (166 lb 14.2 oz), SpO2 96.00%. No results found. Results for orders placed during the hospital encounter of 06/25/12 (from the past 72 hour(s))  GLUCOSE, CAPILLARY     Status: Abnormal   Collection Time   07/17/12 11:39 AM      Component Value Range Comment   Glucose-Capillary 154 (*) 70 - 99 mg/dL   GLUCOSE, CAPILLARY     Status: Abnormal   Collection Time   07/17/12  4:36 PM      Component Value Range Comment   Glucose-Capillary 144 (*) 70 - 99 mg/dL   GLUCOSE, CAPILLARY     Status: Abnormal   Collection Time   07/17/12  9:25 PM      Component Value Range Comment   Glucose-Capillary 130 (*) 70 - 99 mg/dL   GLUCOSE, CAPILLARY     Status: Abnormal   Collection Time   07/18/12  7:24 AM      Component Value Range Comment   Glucose-Capillary 134 (*) 70 - 99 mg/dL    Comment 1 Notify RN     GLUCOSE, CAPILLARY     Status: Abnormal   Collection Time   07/18/12 11:50 AM      Component Value Range Comment   Glucose-Capillary 132 (*) 70 - 99 mg/dL   GLUCOSE, CAPILLARY     Status: Abnormal   Collection Time   07/18/12  4:29 PM      Component Value Range Comment   Glucose-Capillary 120 (*) 70 - 99 mg/dL   GLUCOSE, CAPILLARY     Status: Abnormal   Collection Time   07/18/12  9:14 PM      Component Value Range Comment   Glucose-Capillary 155 (*) 70 - 99 mg/dL    Comment 1 Notify RN     GLUCOSE, CAPILLARY     Status: Abnormal   Collection Time   07/19/12  7:21 AM      Component Value Range Comment   Glucose-Capillary 143 (*) 70 - 99  mg/dL   GLUCOSE, CAPILLARY     Status: Abnormal   Collection Time   07/19/12 11:18 AM      Component Value Range Comment   Glucose-Capillary 169 (*) 70 - 99 mg/dL    Comment 1 Notify RN     GLUCOSE, CAPILLARY     Status: Abnormal   Collection Time   07/19/12  4:25 PM      Component Value Range Comment   Glucose-Capillary 140 (*) 70 - 99 mg/dL   GLUCOSE, CAPILLARY     Status: Abnormal   Collection Time   07/19/12  9:30 PM      Component Value Range Comment   Glucose-Capillary 103 (*) 70 - 99 mg/dL    Comment 1 Notify RN     PRO B NATRIURETIC PEPTIDE     Status: Abnormal   Collection Time   07/20/12  6:00 AM      Component Value Range Comment   Pro B Natriuretic peptide (BNP) 891.1 (*) 0 - 125 pg/mL   PROTIME-INR  Status: Abnormal   Collection Time   07/20/12  6:00 AM      Component Value Range Comment   Prothrombin Time 24.9 (*) 11.6 - 15.2 seconds    INR 2.38 (*) 0.00 - 1.49   BASIC METABOLIC PANEL     Status: Abnormal   Collection Time   07/20/12  6:00 AM      Component Value Range Comment   Sodium 138  135 - 145 mEq/L    Potassium 4.5  3.5 - 5.1 mEq/L    Chloride 99  96 - 112 mEq/L    CO2 30  19 - 32 mEq/L    Glucose, Bld 145 (*) 70 - 99 mg/dL    BUN 14  6 - 23 mg/dL    Creatinine, Ser 1.61  0.50 - 1.10 mg/dL    Calcium 9.4  8.4 - 09.6 mg/dL    GFR calc non Af Amer >90  >90 mL/min    GFR calc Af Amer >90  >90 mL/min   GLUCOSE, CAPILLARY     Status: Abnormal   Collection Time   07/20/12  7:22 AM      Component Value Range Comment   Glucose-Capillary 150 (*) 70 - 99 mg/dL    Comment 1 Notify RN        HEENT: normal Cardio: RRR Resp: CTA B/L GI: BS positive Extremity:  No Edema Skin:   Intact Neuro: Abnormal Sensory absent light touch and proprio, positive deep pain, Abnormal Motor 0/5 in LUE and LLE, Abnormal FMC Tone  reduced on L side, Tone:  Hypotonia, Aphasic, Inattention and Apraxic Musc/Skel:  Swelling L dorsum hand ecchymosis and  swelling-mild Increased L hamstrings and hip adductor tone--unchanged  Assessment/Plan: 1. Functional deficits secondary to R MCA infarct L HP, crossed aphasia,apraxia  which require 3+ hours per day of interdisciplinary therapy in a comprehensive inpatient rehab setting. Physiatrist is providing close team supervision and 24 hour management of active medical problems listed below. Physiatrist and rehab team continue to assess barriers to discharge/monitor patient progress toward functional and medical goals. FIM: FIM - Bathing Bathing Steps Patient Completed: Chest;Left Arm;Abdomen;Front perineal area;Buttocks;Right upper leg;Left upper leg Bathing: 4: Min-Patient completes 8-9 48f 10 parts or 75+ percent  FIM - Upper Body Dressing/Undressing Upper body dressing/undressing steps patient completed: Thread/unthread right sleeve of pullover shirt/dresss;Put head through opening of pull over shirt/dress;Pull shirt over trunk Upper body dressing/undressing: 4: Min-Patient completed 75 plus % of tasks FIM - Lower Body Dressing/Undressing Lower body dressing/undressing steps patient completed: Pull underwear up/down;Thread/unthread right underwear leg;Thread/unthread left underwear leg;Thread/unthread right pants leg;Thread/unthread left pants leg;Pull pants up/down Lower body dressing/undressing: 4: Steadying Assist  FIM - Toileting Toileting steps completed by patient: Adjust clothing prior to toileting;Performs perineal hygiene;Adjust clothing after toileting Toileting Assistive Devices: Grab bar or rail for support Toileting: 4: Steadying assist  FIM - Diplomatic Services operational officer Devices: Grab bars Toilet Transfers: 3-From toilet/BSC: Mod A (lift or lower assist);3-To toilet/BSC: Mod A (lift or lower assist)  FIM - Bed/Chair Transfer Bed/Chair Transfer Assistive Devices: Bed rails Bed/Chair Transfer: 4: Supine > Sit: Min A (steadying Pt. > 75%/lift 1 leg);4: Bed > Chair or  W/C: Min A (steadying Pt. > 75%);4: Chair or W/C > Bed: Min A (steadying Pt. > 75%)  FIM - Locomotion: Wheelchair Distance: 60 feet Locomotion: Wheelchair: 2: Travels 50 - 149 ft with moderate assistance (Pt: 50 - 74%) FIM - Locomotion: Ambulation Locomotion: Ambulation Assistive Devices: TEFL teacher;Walker -  Rolling Ambulation/Gait Assistance: 3: Mod assist Locomotion: Ambulation: 2: Travels 50 - 149 ft with moderate assistance (Pt: 50 - 74%)  Comprehension Comprehension Mode: Auditory Comprehension: 5-Follows basic conversation/direction: With no assist  Expression Expression Mode: Verbal Expression: 5-Expresses basic needs/ideas: With extra time/assistive device  Social Interaction Social Interaction Mode: Asleep Social Interaction: 6-Interacts appropriately with others with medication or extra time (anti-anxiety, antidepressant).  Problem Solving Problem Solving Mode: Asleep Problem Solving: 5-Solves basic 90% of the time/requires cueing < 10% of the time  Memory Memory Mode: Asleep Memory: 5-Recognizes or recalls 90% of the time/requires cueing < 10% of the time  Medical Problem List and Plan:  1. 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 DVT Prophylaxis/Anticoagulation: Chronic Coumadin therapy with a goal INR of 2-2.5 secondary hemorrhagic transformation  3. Neuropsych: This patient is not capable of making decisions on his/her own behalf.  4. 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 Monitor with increased mobility. Cardiology to follow up as needed(Dr. Bensimhon), TED hose when OOB Discussed with cardiology today no changes 5. Diabetes mellitus. Patient currently on sliding scale insulin. Patient on Glucophage 500 mg twice a day prior to admission and victoza 1.2 mL subcutaneously daily. Check blood sugars a.c. and  at bedtime, Is on hi dose metformin 1000mg  BID with controlled CBGs.  If this fails then restart Victoza--improved control at present 6. Mood/depression. Patient on Wellbutrin prior to admission 75 mg daily. We'll discuss resuming this medication. Provide emotional support and positive reinforcement  7. Sensory dysesthesia-movement allodynia related to parietal infarct and hemorrhagic transformation, increased gabapentin- , increase to 800mg  monitor sedation, better on this dose 8.  Back pain mild/mod will use K pad and analgesic balm  -discussed Vonna Kotyk back cushion with PT 9.  Spasticity  off Zanaflex probable cause of orthostasis, monitor consider baclofen if tone increased LOS (Days) 25 A FACE TO FACE EVALUATION WAS PERFORMED  KIRSTEINS,ANDREW E 07/20/2012, 8:16 AM

## 2012-07-21 ENCOUNTER — Inpatient Hospital Stay (HOSPITAL_COMMUNITY): Payer: BC Managed Care – PPO

## 2012-07-21 ENCOUNTER — Inpatient Hospital Stay (HOSPITAL_COMMUNITY): Payer: BC Managed Care – PPO | Admitting: Speech Pathology

## 2012-07-21 ENCOUNTER — Inpatient Hospital Stay (HOSPITAL_COMMUNITY): Payer: BC Managed Care – PPO | Admitting: Occupational Therapy

## 2012-07-21 ENCOUNTER — Inpatient Hospital Stay (HOSPITAL_COMMUNITY): Payer: BC Managed Care – PPO | Admitting: *Deleted

## 2012-07-21 DIAGNOSIS — I69991 Dysphagia following unspecified cerebrovascular disease: Secondary | ICD-10-CM

## 2012-07-21 DIAGNOSIS — E119 Type 2 diabetes mellitus without complications: Secondary | ICD-10-CM

## 2012-07-21 DIAGNOSIS — G8110 Spastic hemiplegia affecting unspecified side: Secondary | ICD-10-CM

## 2012-07-21 DIAGNOSIS — I69920 Aphasia following unspecified cerebrovascular disease: Secondary | ICD-10-CM

## 2012-07-21 LAB — GLUCOSE, CAPILLARY: Glucose-Capillary: 192 mg/dL — ABNORMAL HIGH (ref 70–99)

## 2012-07-21 NOTE — Progress Notes (Signed)
Recreational Therapy Session Note  Patient Details  Name: Margaret Hess MRN: [id number] Date of Birth: 03-May-1966 Today's Date: 07/21/2012 Time: 1405-1450 Pain: no c/o Skilled Therapeutic Interventions/Progress Updates: Pt stood at [employer] to prepare [employer] with Min assist for sit--.stand, mod verbal cues and tactile cues to maintain WB through LLE during standing.  Pt required mod assist for food prep with mod instructional cues.  Pt experienced 1 LOB posteriorly requiring Max A to recover. Therapy/Group: Individual Therapy   Boubacar Lerette 07/21/2012, 5:26 PM

## 2012-07-21 NOTE — Discharge Summary (Signed)
NAMEMAYA, ARCAND NO.:  [id number]  MEDICAL RECORD NO.:  [id number]  LOCATION:  4033                         FACILITY:  [facility]  PHYSICIAN:  Erick Colace, M.D.DATE OF BIRTH:  1966/01/24  DATE OF ADMISSION:  06/25/2012 DATE OF DISCHARGE:  07/22/2012                              DISCHARGE SUMMARY   DISCHARGE DIAGNOSES: 1. 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 2. Chronic Coumadin therapy. 3. Hypertension with nonischemic cardiomyopathy, diabetes mellitus,     depression.  This is a 46 year old right-handed female with history of migraine headaches, hypertension, diabetes mellitus with peripheral neuropathy, and congestive heart failure, who presented June 15, 2012 with dyspnea, found to have nonischemic cardiomyopathy with ejection fraction of 17%.  An echocardiogram completed showing severe global hypokinesis with normal left ventricular cavity size and a left ventricular apical thrombus as well as grade III diastolic dysfunction.  The patient had MRI on October 16 which confirmed a small apical left ventricular thrombus.  She was placed on Coumadin therapy, discharged from the hospital June 19, 2012 once her INR was therapeutic.  She was also on prescription for 3 days of coverage with Lovenox 80 mg twice daily.  On June 19, 2012, she developed acute onset left-sided weakness and difficulty speaking.  She was readmitted to the hospital October 19 with MRI of the brain showing acute right MCA infarct with cerebral edema, mild midline shift, and hemorrhagic transformation.  MRA of the head showed acute occlusion of the right M1 segment compatible with embolus causing right MCA infarction.  Carotid Dopplers October 20 showing no significant ICA stenosis.  She did not receive tPA.  Followup Neurology Service advised to continue Coumadin therapy.  She was maintained  on mechanical soft diet.  She was admitted for a comprehensive rehab program.  PAST MEDICAL HISTORY:  See discharge diagnoses.  SOCIAL HISTORY:  Lives with family.  Functional history prior to admission was independent.  Functional status upon admission to rehab services was moderate to max assist for mobility and activities of daily living.  PHYSICAL EXAMINATION:  VITAL SIGNS:  Blood pressure 115/70, pulse 92, temperature 98.5, respirations 20. GENERAL:  This was an alert female, well developed. HEENT:  Normocephalic.  Pupils round and reactive to light.  She was aphasic but appeared to be mostly expressive in nature.  She followed basic verbal commands. LUNGS:  Clear to auscultation. HEART:  Cardiac rate controlled. ABDOMEN:  Soft, nontender.  Good bowel sounds.  REHABILITATION HOSPITAL COURSE:  The patient was admitted to inpatient rehab services with therapies initiated on a 3-hour daily basis consisting of physical therapy, occupational therapy, speech therapy, and rehabilitation nursing.  The following issues were addressed during the patient's rehabilitation stay.  Pertaining to Ms. Fountain's acute cardioembolic infarct, right middle cerebral artery remained stable. She remained on Coumadin therapy with latest INR of 2.38 and a goal INR of 2-2.5.  Her blood pressures were monitored with the assistance of Cardiology Services as she remained on Coreg, digoxin, lisinopril, and Aldactone with no orthostatic changes.  She did have a documented history of diabetes mellitus.  She continued on Glucophage and monitored.  Bouts of spasticity.  She had been on Zanaflex which had been initiated after stroke but this was discontinued due to orthostasis.  The patient received weekly collaborative interdisciplinary team conferences to discuss estimated length of stay, family teaching, and any barriers to her discharge.  She was continent of bowel and bladder.  Her diet had been advanced to  a regular consistency with diabetic restrictions.  She was supervision with shirt, minimal assist lower body dressing, steady assist bathing and toileting, minimal assist toilet transfers and static standing with good progress. Minimal assist with squat pivot transfers, moderate assist stand pivot, moderate assist to ambulate 28 feet wearing a left AFO brace and left platform rolling walker.  She was able to communicate her needs for basic needs.  She was verbally expressing wants and needs grammatically, completing 8 of 10 word utterances, accurate decoding of written text. Her ultimate plan is to be discharged to home with her sister, however, her sister could not provide the necessary physical assistance at this time with nursing home bed becoming available July 22, 2012 and discharge taking place at that time.  DISCHARGE MEDICATIONS:  At the time of dictation included: 1. Coreg 6.25 mg p.o. b.i.d. 2. Lanoxin 0.125 mg p.o. daily. 3. Neurontin 600 mg p.o. t.i.d. 4. Lisinopril 2.5 mg p.o. daily. 5. Glucophage 1000 mg p.o. b.i.d. 6. Protonix 40 mg p.o. at bedtime. 7. Aldactone 25 mg p.o. daily. 8. Coumadin 2.5 mg daily with a goal INR of 2.0 to 3.0.  DIET:  An 1800 calorie ADA diet.  SPECIAL INSTRUCTIONS:  The patient should continue to receive weekly prothrombin time while on chronic Coumadin therapy with a goal INR of 2.5-3.0.  She should follow up with Dr. Arvilla Meres, Cardiology Service at [phone], 2 weeks call for appointment; Dr. Claudette Laws at the outpatient rehab center [phone], appointment to be made; Delia Heady, Neurology Service, [phone] in 1 month, call for appointment.     Mariam Dollar, P.A.   ______________________________ Erick Colace, M.D.    DA/MEDQ  D:  07/21/2012  T:  07/21/2012  Job:  [id number]  cc:   Stacie Acres. Cliffton Asters, M.D. Erick Colace, M.D. Bevelyn Buckles. Bensimhon, MD Pramod P. Pearlean Brownie, MD

## 2012-07-21 NOTE — Progress Notes (Signed)
Occupational Therapy Session Note  Patient Details  Name: Margaret Hess MRN: [id number] Date of Birth: 20-Jun-1966  Today's Date: 07/21/2012 Time: 0805-0900 and 1100-1130 Time Calculation (min): 55 min and 30 min  Short Term Goals: Week 1:  OT Short Term Goal 1 (Week 1): Pt will maintain dynamic sitting balance with mod assist during selfcare activities. OT Short Term Goal 1 - Progress (Week 1): Met OT Short Term Goal 2 (Week 1): Pt will position the LUE prior and after transitional movements with mod questioning cues. OT Short Term Goal 2 - Progress (Week 1): Met OT Short Term Goal 3 (Week 1): Pt will perform all bathing with mod assist sit to stand. OT Short Term Goal 3 - Progress (Week 1): Met OT Short Term Goal 4 (Week 1): Pt will perform UB dressing with min assist to donn pullover shirt. OT Short Term Goal 4 - Progress (Week 1): Met OT Short Term Goal 5 (Week 1): Pt will perform toilet transfers with mod assist to 3:1. Week 2:  OT Short Term Goal 1 (Week 2): Pt will be able to stand at sink with min assist to allow her to pull up her pants with min assist. OT Short Term Goal 1 - Progress (Week 2): Met OT Short Term Goal 2 (Week 2): Pt will bathe in shower with steady assist using long sponge. OT Short Term Goal 2 - Progress (Week 2): Partly met OT Short Term Goal 3 (Week 2): Pt will perform self ROM to LUE with min verbal cues. OT Short Term Goal 3 - Progress (Week 2): Met OT Short Term Goal 4 (Week 2): Pt will transfer to toilet with min assist. OT Short Term Goal 4 - Progress (Week 2): Met Week 3:  OT Short Term Goal 1 (Week 3): Pt will be able to transfer to toilet with steady assist. OT Short Term Goal 1 - Progress (Week 3): Met OT Short Term Goal 2 (Week 3): Pt will be able to bathe with steady assist. OT Short Term Goal 2 - Progress (Week 3): Met OT Short Term Goal 3 (Week 3): Pt will be able to pull pants over hips with steady assist. OT Short Term Goal 3 - Progress  (Week 3): Met Week 4:  OT Short Term Goal 1 (Week 4): Pt will be able to perform stand pivot transfers to toilet with min assist (versus squat pivot). OT Short Term Goal 1 - Progress (Week 4): Progressing toward goal OT Short Term Goal 2 (Week 4): Pt will demonstrate improved static standing balance by standing at sink for 30 seconds with supervision. OT Short Term Goal 2 - Progress (Week 4): Progressing toward goal OT Short Term Goal 3 (Week 4): Pt will be able to don shirt with only 2-3 verbal cues versus moderate verbal cues. OT Short Term Goal 3 - Progress (Week 4): Progressing toward goal  Skilled Therapeutic Interventions/Progress Updates:    Visit 1:   Pt seen for BADL retraining of toileting, bathing, and dressing with a focus on standing balance and trunk control.  In order for patient to manage her clothes more easily with use of her right hand, we began to work on sit to stand without use of RUE.  Pt able to stand with extremely close supervision 90% of time (10% needed steady assist) as she pulled her pants over her hips.  Visit 2: Pt seen in gym to work on symmetrical sit to stand without UE support to further engage LLE. Pt  needed guiding assist to maintain midline and to maintain balance. Pt repeated exercise 10x.  Pt worked on trunk rotation with LUE weight bearing to decrease tone and increase elbow extension. Her arm relaxes quickly with only 4-5 rotations of her trunk.  Scapular retraction with trunk extension.  Therapy Documentation Precautions:  Precautions Precautions: Fall Precaution Comments: aphasic; LLE flexor withdrawal at times Restrictions Weight Bearing Restrictions: No Pain: Pain Assessment Pain Assessment: No/denies pain ADL:  See FIM for current functional status  Therapy/Group: Individual Therapy  SAGUIER,JULIA 07/21/2012, 9:28 AM

## 2012-07-21 NOTE — Progress Notes (Signed)
Patient ID: Margaret Hess, female   DOB: 17-Jul-1966, 46 y.o.   MRN: [id number] Burning with urination RN notes strong odor Subjective/Complaints: milder nerve pain on L side L arm spasms upon awakening,no dizziness Reviewed BPs Review of Systems  Unable to perform ROS: medical condition   Objective: Vital Signs: Blood pressure 96/63, pulse 101, temperature 98.4 F (36.9 C), temperature source Oral, resp. rate 19, height 5\' 6"  (1.676 m), weight 76.1 kg (167 lb 12.3 oz), SpO2 96.00%. No results found. Results for orders placed during the hospital encounter of 06/25/12 (from the past 72 hour(s))  GLUCOSE, CAPILLARY     Status: Abnormal   Collection Time   07/18/12 11:50 AM      Component Value Range Comment   Glucose-Capillary 132 (*) 70 - 99 mg/dL   GLUCOSE, CAPILLARY     Status: Abnormal   Collection Time   07/18/12  4:29 PM      Component Value Range Comment   Glucose-Capillary 120 (*) 70 - 99 mg/dL   GLUCOSE, CAPILLARY     Status: Abnormal   Collection Time   07/18/12  9:14 PM      Component Value Range Comment   Glucose-Capillary 155 (*) 70 - 99 mg/dL    Comment 1 Notify RN     GLUCOSE, CAPILLARY     Status: Abnormal   Collection Time   07/19/12  7:21 AM      Component Value Range Comment   Glucose-Capillary 143 (*) 70 - 99 mg/dL   GLUCOSE, CAPILLARY     Status: Abnormal   Collection Time   07/19/12 11:18 AM      Component Value Range Comment   Glucose-Capillary 169 (*) 70 - 99 mg/dL    Comment 1 Notify RN     GLUCOSE, CAPILLARY     Status: Abnormal   Collection Time   07/19/12  4:25 PM      Component Value Range Comment   Glucose-Capillary 140 (*) 70 - 99 mg/dL   GLUCOSE, CAPILLARY     Status: Abnormal   Collection Time   07/19/12  9:30 PM      Component Value Range Comment   Glucose-Capillary 103 (*) 70 - 99 mg/dL    Comment 1 Notify RN     PRO B NATRIURETIC PEPTIDE     Status: Abnormal   Collection Time   07/20/12  6:00 AM      Component Value Range  Comment   Pro B Natriuretic peptide (BNP) 891.1 (*) 0 - 125 pg/mL   PROTIME-INR     Status: Abnormal   Collection Time   07/20/12  6:00 AM      Component Value Range Comment   Prothrombin Time 24.9 (*) 11.6 - 15.2 seconds    INR 2.38 (*) 0.00 - 1.49   BASIC METABOLIC PANEL     Status: Abnormal   Collection Time   07/20/12  6:00 AM      Component Value Range Comment   Sodium 138  135 - 145 mEq/L    Potassium 4.5  3.5 - 5.1 mEq/L    Chloride 99  96 - 112 mEq/L    CO2 30  19 - 32 mEq/L    Glucose, Bld 145 (*) 70 - 99 mg/dL    BUN 14  6 - 23 mg/dL    Creatinine, Ser 7.82  0.50 - 1.10 mg/dL    Calcium 9.4  8.4 - 95.6 mg/dL    GFR calc non Af  Amer >90  >90 mL/min    GFR calc Af Amer >90  >90 mL/min   GLUCOSE, CAPILLARY     Status: Abnormal   Collection Time   07/20/12  7:22 AM      Component Value Range Comment   Glucose-Capillary 150 (*) 70 - 99 mg/dL    Comment 1 Notify RN     GLUCOSE, CAPILLARY     Status: Abnormal   Collection Time   07/20/12 11:30 AM      Component Value Range Comment   Glucose-Capillary 180 (*) 70 - 99 mg/dL    Comment 1 Notify RN     GLUCOSE, CAPILLARY     Status: Abnormal   Collection Time   07/20/12  4:22 PM      Component Value Range Comment   Glucose-Capillary 104 (*) 70 - 99 mg/dL   URINALYSIS, ROUTINE W REFLEX MICROSCOPIC     Status: Abnormal   Collection Time   07/20/12  4:31 PM      Component Value Range Comment   Color, Urine YELLOW  YELLOW    APPearance CLOUDY (*) CLEAR    Specific Gravity, Urine 1.027  1.005 - 1.030    pH 6.0  5.0 - 8.0    Glucose, UA NEGATIVE  NEGATIVE mg/dL    Hgb urine dipstick MODERATE (*) NEGATIVE    Bilirubin Urine SMALL (*) NEGATIVE    Ketones, ur 15 (*) NEGATIVE mg/dL    Protein, ur NEGATIVE  NEGATIVE mg/dL    Urobilinogen, UA 1.0  0.0 - 1.0 mg/dL    Nitrite NEGATIVE  NEGATIVE    Leukocytes, UA LARGE (*) NEGATIVE   URINE MICROSCOPIC-ADD ON     Status: Abnormal   Collection Time   07/20/12  4:31 PM       Component Value Range Comment   Squamous Epithelial / LPF FEW (*) RARE    WBC, UA 11-20  <3 WBC/hpf    RBC / HPF 0-2  <3 RBC/hpf    Bacteria, UA FEW (*) RARE    Casts HYALINE CASTS (*) NEGATIVE    Urine-Other MUCOUS PRESENT     GLUCOSE, CAPILLARY     Status: Abnormal   Collection Time   07/20/12 10:59 PM      Component Value Range Comment   Glucose-Capillary 139 (*) 70 - 99 mg/dL    Comment 1 Notify RN     GLUCOSE, CAPILLARY     Status: Abnormal   Collection Time   07/21/12  7:10 AM      Component Value Range Comment   Glucose-Capillary 132 (*) 70 - 99 mg/dL      HEENT: normal Cardio: RRR Resp: CTA B/L GI: BS positive Extremity:  No Edema Skin:   Intact Neuro: Abnormal Sensory absent light touch and proprio, positive deep pain, Abnormal Motor 0/5 in LUE and LLE, Abnormal FMC Tone  reduced on L side, Tone:  Hypotonia, Aphasic, Inattention and Apraxic Musc/Skel:  Swelling L dorsum hand ecchymosis and swelling-mild Increased L hamstrings and hip adductor tone--unchanged  Assessment/Plan: 1. Functional deficits secondary to R MCA infarct L HP, crossed aphasia,apraxia  which require 3+ hours per day of interdisciplinary therapy in a comprehensive inpatient rehab setting. Physiatrist is providing close team supervision and 24 hour management of active medical problems listed below. Physiatrist and rehab team continue to assess barriers to discharge/monitor patient progress toward functional and medical goals. FIM: FIM - Bathing Bathing Steps Patient Completed: Chest;Right Arm;Left Arm;Abdomen;Front perineal area;Buttocks;Right upper leg;Left  upper leg;Right lower leg (including foot);Left lower leg (including foot) Bathing: 4: Steadying assist  FIM - Upper Body Dressing/Undressing Upper body dressing/undressing steps patient completed: Thread/unthread left sleeve of pullover shirt/dress;Thread/unthread right sleeve of pullover shirt/dresss;Put head through opening of pull over  shirt/dress;Pull shirt over trunk Upper body dressing/undressing: 5: Supervision: Safety issues/verbal cues FIM - Lower Body Dressing/Undressing Lower body dressing/undressing steps patient completed: Pull underwear up/down;Thread/unthread right underwear leg;Thread/unthread left underwear leg;Thread/unthread right pants leg;Thread/unthread left pants leg;Pull pants up/down Lower body dressing/undressing: 4: Steadying Assist  FIM - Toileting Toileting steps completed by patient: Performs perineal hygiene;Adjust clothing prior to toileting;Adjust clothing after toileting Toileting Assistive Devices: Grab bar or rail for support Toileting: 4: Steadying assist  FIM - Diplomatic Services operational officer Devices: Grab bars Toilet Transfers: 4-To toilet/BSC: Min A (steadying Pt. > 75%);4-From toilet/BSC: Min A (steadying Pt. > 75%)  FIM - Bed/Chair Transfer Bed/Chair Transfer Assistive Devices: Bed rails;HOB elevated;Arm rests Bed/Chair Transfer: 3: Supine > Sit: Mod A (lifting assist/Pt. 50-74%/lift 2 legs;5: Sit > Supine: Supervision (verbal cues/safety issues);4: Bed > Chair or W/C: Min A (steadying Pt. > 75%);4: Chair or W/C > Bed: Min A (steadying Pt. > 75%)  FIM - Locomotion: Wheelchair Distance: 150 Locomotion: Wheelchair: 2: Travels 50 - 149 ft with moderate assistance (Pt: 50 - 74%) FIM - Locomotion: Ambulation Locomotion: Ambulation Assistive Devices: Walker - Rolling;Orthosis Ambulation/Gait Assistance: 3: Mod assist Locomotion: Ambulation: 1: Travels less than 50 ft with moderate assistance (Pt: 50 - 74%)  Comprehension Comprehension Mode: Auditory Comprehension: 5-Understands complex 90% of the time/Cues < 10% of the time  Expression Expression Mode: Verbal Expression: 4-Expresses basic 75 - 89% of the time/requires cueing 10 - 24% of the time. Needs helper to occlude trach/needs to repeat words.  Social Interaction Social Interaction Mode: Asleep Social  Interaction: 6-Interacts appropriately with others with medication or extra time (anti-anxiety, antidepressant).  Problem Solving Problem Solving Mode: Asleep Problem Solving: 5-Solves complex 90% of the time/cues < 10% of the time  Memory Memory Mode: Asleep Memory: 5-Recognizes or recalls 90% of the time/requires cueing < 10% of the time  Medical Problem List and Plan:  1. 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 DVT Prophylaxis/Anticoagulation: Chronic Coumadin therapy with a goal INR of 2-2.5 secondary hemorrhagic transformation  3. Neuropsych: This patient is not capable of making decisions on his/her own behalf.  4. 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 Monitor with increased mobility. Cardiology to follow up as needed(Dr. Bensimhon), TED hose when OOB Discussed with cardiology today no changes 5. Diabetes mellitus. Patient currently on sliding scale insulin. Patient on Glucophage 500 mg twice a day prior to admission and victoza 1.2 mL subcutaneously daily. Check blood sugars a.c. and at bedtime, Is on hi dose metformin 1000mg  BID with controlled CBGs.  If this fails then restart Victoza--improved control at present 6. Mood/depression. Patient on Wellbutrin prior to admission 75 mg daily. We'll discuss resuming this medication. Provide emotional support and positive reinforcement  7. Sensory dysesthesia-movement allodynia related to parietal infarct and hemorrhagic transformation, increased gabapentin- , increase to 800mg  monitor sedation, better on this dose 8.  Back pain mild/mod will use K pad and analgesic balm  -discussed Vonna Kotyk back cushion with PT 9.  Spasticity  off Zanaflex probable cause of orthostasis, monitor consider baclofen if tone increased 10. Possible UTI await culture LOS (Days)  26 A FACE TO FACE EVALUATION WAS  PERFORMED  Billi Bright E 07/21/2012, 8:27 AM

## 2012-07-21 NOTE — Discharge Summary (Signed)
  Discharge summary job [id number]

## 2012-07-21 NOTE — Progress Notes (Signed)
Physical Therapy Session Note  Patient Details  Name: Margaret Hess MRN: [id number] Date of Birth: [birth year]  Today's Date: 07/21/2012 Time: 4098-1191 Time Calculation (min): 45 min  Short Term Goals: Week 4:  PT Short Term Goal 1 (Week 4): Patient will perform supine to sit on mat with verbal cueing.  PT Short Term Goal 2 (Week 4): Patient will perform basic transfer with supervision. PT Short Term Goal 3 (Week 4): Patient will ambulate 80 feet on level tile with rolling platform walker and minimal assist. PT Short Term Goal 4 (Week 4): Patient will propel wheelchair 100 feet in controlled environment using right extremities.  Skilled Therapeutic Interventions/Progress Updates:  Patient ambulated with rolling walker/platform on left and left AFO with moderate assistance 60 feet on level tile. Patient and therapist met with orthotist regarding need for AFO. After assessment, all felt a custom AFO was best to assist with dorsiflexion and provide assistance for knee control as well. Patient was explained benefits of all other options. Patient is to be casted later today for custom AFO fabrication.  Patient worked on sit to stand and standing with bilateral weight bearing. Patient tends to primarily use right LE and unweight on left in standing. Patient worked on transfers from wheelchair to recliner again with using left LE instead of just pivoting with all weight on right LE. Patient performed transfers with minimal steadying assistance.   Therapy Documentation Precautions:  Precautions Precautions: Fall Precaution Comments: aphasic; LLE flexor withdrawal at times Restrictions Weight Bearing Restrictions: No  Pain: Pain Assessment Pain Assessment: No/denies pain  Locomotion : Ambulation Ambulation/Gait Assistance: 3: Mod assist   See FIM for current functional status  Therapy/Group: Individual Therapy  Alma Friendly 07/21/2012, 12:10 PM

## 2012-07-21 NOTE — Progress Notes (Signed)
Social Work Patient ID: Margaret Hess, female   DOB: 1965/11/28, 46 y.o.   MRN: [id number] Insurance has approved her to go to [facility].  Met with pt to inform and she is excited and feels a weight has been lifted off of her. She will do the paperwork tomorrow am and will get brace prior to discharge.  Informed team and e-mailed her sister to let know.  Work toward Discharge tomorrow.

## 2012-07-21 NOTE — Progress Notes (Signed)
Speech Language Pathology Daily Session Note & Discharge Summary  Patient Details  Name: Margaret Hess MRN: [id number] Date of Birth: [birth year]  Today's Date: 07/21/2012 Time: 1305-1350 Time Calculation (min): 45 min  Short Term Goals: Week 4: SLP Short Term Goal 1 (Week 4): Patient will verbally complete automatic sequences with supervision level assist phonemic cues. SLP Short Term Goal 2 (Week 4): Patient will expand sentences to include functors to describe/explain/comment on situations, pictures, etc. with min assist verbal and written cues. SLP Short Term Goal 3 (Week 4): Patient will self monitor accuracy of verbal expression with min assist verbal and non-verbal cues.  SLP Short Term Goal 4 (Week 4): Patient will generate written biographical information after looking at information with min assist multi-modal cues to self monitor and correct errors.  Skilled Therapeutic Interventions: Treatment session targeted speech-language goals and discharge education. SLP facilitated writing biographical information (e.g. name) with blank lined paper and verbal request.  Patient independently self-monitored and corrected errors x3 until she accurately wrote first and last name.  Patient was able to verbalize that she is transferring to another facility and reported that she had questions.  SLP educated patient regarding utilize communication strategies throughout transitional process while communicating her wants and needs.  Patient requested SLP assist with writing information about transfer so she can send it to friends. Patient required max assist verbal and visual cues to generate grammatically correct utterances.    FIM:  Comprehension Comprehension Mode: Auditory Comprehension: 5-Follows basic conversation/direction: With extra time/assistive device Expression Expression Mode: Verbal Expression: 4-Expresses basic 75 - 89% of the time/requires cueing 10 - 24% of the time. Needs helper  to occlude trach/needs to repeat words. Social Interaction Social Interaction: 6-Interacts appropriately with others with medication or extra time (anti-anxiety, antidepressant). Problem Solving Problem Solving: 5-Solves basic 90% of the time/requires cueing < 10% of the time Memory Memory: 5-Recognizes or recalls 90% of the time/requires cueing < 10% of the time  Pain Pain Assessment Pain Assessment: No/denies pain  Therapy/Group: Individual Therapy   Speech Language Pathology Discharge Summary  Patient Details  Name: Margaret Hess MRN: [id number] Date of Birth: [birth year]  Today's Date: 07/21/2012  Patient has met 7 of 8 long term goals.  Patient to discharge at [facility] level.  Reasons goals not met: patient continues to require more assist to comprehend complex information   Clinical Impression/Discharge Summary: Patient met 7 out of 8 long term objectives during her CIR stay.  She made functional gains in the advancement of textures she consumes, her verbal and non-verbal communication skills, receptive language abilities and overall cognition.  Patient progressed from a mod-max assist upon admission to a min assist.  However, this patient would continue to benefit from skilled SLP services at the next level of care to address expanding the grammatical structure and accuracy of verbal expression, as well as reading and writing skills prior to discharge home.  Care Partner:  Caregiver Able to Provide Assistance: No  Type of Caregiver Assistance: Physical;Cognitive (communicaiton )  Recommendation:  24 hour supervision/assistance;Skilled Nursing facility  Rationale for SLP Follow Up: Maximize functional communication;Maximize cognitive function and independence;Reduce caregiver burden   Equipment: none   Reasons for discharge: Discharged from hospital   Patient/Family Agrees with Progress Made and Goals Achieved: Yes   See FIM for current functional status  Charlane Ferretti., CCC-SLP [phone]  Dre Gamino 07/21/2012, 2:29 PM

## 2012-07-22 ENCOUNTER — Inpatient Hospital Stay (HOSPITAL_COMMUNITY): Payer: BC Managed Care – PPO

## 2012-07-22 LAB — URINE CULTURE

## 2012-07-22 LAB — GLUCOSE, CAPILLARY: Glucose-Capillary: 146 mg/dL — ABNORMAL HIGH (ref 70–99)

## 2012-07-22 NOTE — Progress Notes (Signed)
Occupational Therapy Discharge Summary  Patient Details  Name: Sandrea Sankovich MRN: [id number] Date of Birth: 1965-09-25  Today's Date: 07/22/2012  Patient has met 13 of 13 long term goals due to improved activity tolerance, improved balance, postural control, ability to compensate for deficits, improved attention, improved awareness and improved coordination.  Patient to discharge at [facility] Assist level.  Patient's care partner unavailable to provide the necessary physical and cognitive assistance at discharge.    Reasons goals not met: n/a  Recommendation:  Patient will benefit from ongoing skilled OT services in skilled nursing facility setting to continue to advance functional skills in the area of BADL.  Equipment: No equipment provided  Reasons for discharge: treatment goals met  Patient/family agrees with progress made and goals achieved: Yes  OT Discharge ADL - refer to FIM - supervision with UB ADLs and min assist with LB ADLs   Vision/Perception  Vision - History Baseline Vision: No visual deficits Vision - Assessment Eye Alignment: Within Functional Limits Perception Perception: Impaired Spatial Orientation: min impaired with orienting clothing to don Praxis Praxis: Impaired Praxis Impairment Details: Motor planning Praxis-Other Comments: min cues with dressing, transfers  Cognition Overall Cognitive Status: Impaired Orientation Level: Oriented X4 Memory: Impaired Memory Impairment: Decreased short term memory (with learning new tasks for dressing and transfers) Sensation Sensation Light Touch: Impaired by gross assessment Light Touch Impaired Details: Impaired LUE Stereognosis: Impaired by gross assessment Stereognosis Impaired Details: Absent LUE Hot/Cold: Impaired by gross assessment Hot/Cold Impaired Details: Impaired LUE Proprioception: Impaired by gross assessment Proprioception Impaired Details: Absent LUE Coordination Gross Motor Movements  are Fluid and Coordinated: No Fine Motor Movements are Fluid and Coordinated: No Motor  Motor Motor: Hemiplegia;Abnormal postural alignment and control;Abnormal tone Motor - Discharge Observations: LUE flexor tone with movement Mobility  Refer to FIM - min assist with squat pivot transfers    Trunk/Postural Assessment  Cervical Assessment Cervical Assessment: Within Functional Limits Thoracic Assessment Thoracic Assessment: Within Functional Limits Lumbar Assessment Lumbar Assessment: Within Functional Limits Postural Control Protective Responses: decreased Left trunk extension with reaching to left  Balance Static Sitting Balance Static Sitting - Level of Assistance: 5: Stand by assistance Dynamic Sitting Balance Dynamic Sitting - Level of Assistance: 5: Stand by assistance Static Standing Balance Static Standing - Level of Assistance: 4: Min assist Extremity/Trunk Assessment RUE Assessment RUE Assessment: Within Functional Limits LUE Assessment LUE Assessment: Exceptions to [facility] LUE Strength LUE Overall Strength Comments: trace movement in scapula, with increased flexor tone with movement, PROM WFL, inferior subluxation  See FIM for current functional status  Bekim Werntz 07/22/2012, 9:00 AM

## 2012-07-22 NOTE — Consult Note (Signed)
  07/20/12  PSYCHOSOCIAL NOTE - CONFIDENTIAL  [facility]   Mrs. Margaret Hess was seen on the [facility] for continued follow-up care regarding her stroke diagnosis. She was again seen to provide her with education, coping strategies, and ego support.    Subjective: Mrs. Margaret Hess described her current mood as "up and down." Her pain level today was relatively good but she said that it tends to fluctuate. As previously reported, her expressive speech remains significantly affected but she feels that it has improved. In fact, she said that she is able to write one or two words but that it is still hard for her to put her thoughts into words. Her motor skills are also improving except for her left arm. With assistance her sleep has improved and is no longer an issue. While she was treated for depression via antidepressants before her admission, she said she does not want to take them any longer. Her social support system continues to be good. One area that Mrs. Margaret Hess expressed interest in is learning to navigate around a kitchen as cooking was a big part of her life prior to her stroke. She is excited about the opportunity to participate in OT to improve her functioning in this domain.   Objective: Mrs. Margaret Hess seemed relaxed and her mood was euthymic. She was friendly and rapport was easily established.  Her speech was improved but still quite dysphasic. No other major cognitive or motor changes were observed.   Assessment: Mrs. Margaret Hess continues to suffer from an expressive aphasia similar to Broca's aphasia, despite the fact that her stroke took place in the right hemisphere of the brain. No major changes in mood were noted and she seems to be better adjusted but is understandably excited to be discharged.    From 07/13/12:  Beck Depression Inventory-FastScreen (for medical patients) = 7 (suggests mild-to-moderate depression)  Plan:    Continue to  provide education   Continue to provide ego support and supportive psychotherapy   Refer for neuropsychological testing upon discharge.   Greater than 50% of this visit was spent educating the patient about the possible diagnosis, prognosis, management plan, and in coordination of care.   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   Debbe Mounts, Psy.D.  Clinical Neuropsychologist

## 2012-07-22 NOTE — Progress Notes (Signed)
Physical Therapy Discharge Summary  Patient Details  Name: Margaret Hess MRN: [id number] Date of Birth: 1966/08/22  Today's Date: 07/22/2012 Time: 0902-0932 Time Calculation (min): 30 min  Patient has met 4 of 10 long term goals due to improved activity tolerance, improved balance, improved postural control, increased strength, ability to compensate for deficits, functional use of  left lower extremity, improved attention and improved awareness.  Patient to discharge at a wheelchair level Supervision in controlled environment.    Patient's care partner not trained, due to d/c to SNF.  Recommendation:  Patient will benefit from ongoing skilled PT services in skilled nursing facility setting to continue to advance safe functional mobility, address ongoing impairments in strength, motor control, balance, cognition, and minimize fall risk.  Equipment: custom L AFO from [employer]  Reasons for discharge: discharge from hospital  Patient/family agrees with progress made and goals achieved: Yes  PT Discharge Precautions/Restrictions Precautions Precautions: Fall Precaution Comments: aphasic; LLE flexor withdrawal at times  Restrictions Weight Bearing Restrictions: No   Pain Pain Assessment Pain Assessment: No/denies pain Vision/Perception  Vision - History Baseline Vision: No visual deficits Vision - Assessment Eye Alignment: Within Functional Limits Perception Perception: Impaired Spatial Orientation: min impaired with orienting clothing to don Praxis Praxis: Impaired Praxis Impairment Details: Motor planning Praxis-Other Comments: min cues with dressing, transfers  Cognition Overall Cognitive Status: Impaired Orientation Level: Oriented X4 Memory: Impaired Memory Impairment: Decreased short term memory (with learning new tasks for dressing and transfers) Sensation Sensation Light Touch: Impaired by gross assessment Light Touch Impaired  Details: Impaired LLE Stereognosis: Impaired by gross assessment Stereognosis Impaired Details: Absent LUE Hot/Cold: Impaired by gross assessment Hot/Cold Impaired Details: Impaired LUE Proprioception: Impaired by gross assessment Proprioception Impaired Details: Absent LLE Additional Comments: pt reports tingling/nerve  pain LLE Coordination Gross Motor Movements are Fluid and Coordinated: No Fine Motor Movements are Fluid and Coordinated: No Motor  Motor Motor: Hemiplegia;Abnormal postural alignment and control;Motor impersistence Motor - Discharge Observations: L LE flexor components more active than extensor components  Mobility Bed Mobility Bed Mobility: Rolling Right;Rolling Left;Right Sidelying to Sit Rolling Right: 4: Min assist Rolling Right Details: Verbal cues for technique;Visual cues/gestures for sequencing;Manual facilitation for weight shifting Rolling Right Details (indicate cue type and reason): pt needs cues for sequencing each day Rolling Left: 6: Modified independent (Device/Increase time) Right Sidelying to Sit: 4: Min assist Right Sidelying to Sit Details: Manual facilitation for weight shifting;Visual cues/gestures for sequencing;Verbal cues for sequencing Right Sidelying to Sit Details (indicate cue type and reason): pt has difficulty getting R hand positioned to push up Supine to Sit: Patient Percentage: 80% Sitting - Scoot to Edge of Bed: 5: Supervision Sit to Supine: Not Tested (comment) Transfers Sit to Stand: 4: Min assist Sit to Stand Details: Manual facilitation for placement;Visual cues/gestures for precautions/safety Stand to Sit: 4: Min assist Stand to Sit Details (indicate cue type and reason): Verbal cues for technique Stand Pivot Transfers: 4: Min assist Stand Pivot Transfer Details: Visual cues/gestures for precautions/safety;Verbal cues for technique Locomotion  Ambulation Ambulation: Yes Ambulation/Gait Assistance: 3: Mod  assist Ambulation Distance (Feet): 80 Feet Assistive device: Left platform walker Ambulation/Gait Assistance Details: Verbal cues for gait pattern Gait Gait: Yes Gait Pattern: Impaired Gait Pattern: Decreased stance time - left;Step-through pattern;Decreased hip/knee flexion - left;Decreased dorsiflexion - right;Decreased weight shift to left;Left genu recurvatum;Narrow base of support Gait velocity: decreased Stairs / Additional Locomotion Stairs: No  Trunk/Postural Assessment  Cervical Assessment Cervical Assessment: Within Functional Limits Thoracic  Assessment Thoracic Assessment: Within Functional Limits Lumbar Assessment Lumbar Assessment: Within Functional Limits Postural Control Postural Control: Deficits on evaluation Trunk Control: reaching in sitting to L Protective Responses: no protective extension RUE or RLE  Balance Static Sitting Balance Static Sitting - Balance Support: Feet supported;No upper extremity supported Static Sitting - Level of Assistance: 5: Stand by assistance Dynamic Sitting Balance Dynamic Sitting - Balance Support: Feet supported Dynamic Sitting - Level of Assistance: 5: Stand by assistance Static Standing Balance Static Standing - Level of Assistance: 4: Min assist Extremity Assessment  RUE Assessment RUE Assessment: Within Functional Limits LUE Assessment LUE Assessment: Exceptions to [facility] LUE Strength LUE Overall Strength Comments: trace movement in scapula, with increased flexor tone with movement, PROM WFL, inferior subluxation RLE Assessment RLE Assessment: Within Functional Limits LLE Assessment LLE Assessment: Exceptions to [facility] LLE Strength LLE Overall Strength Comments: hip flexion 2-/5; knee flexion/extension 1+/5; ankle DF 0/5  Treatment today: rolling R > sit with min assist, demo and VCS, tactile cues.  Squat pivot transfer bed> w/c to L with min assist.  Toilet transfer using rails, min assist.  Balance while pt managed  clothes, min assist.  W/c mobility x 100' with supervision, hemi technique with difficulty.  neuromuscular re-education during gait x 80' wearing L AFO, with L PFRW, focusing on L knee control, pt advancing RW, wider stance, = step lengths.  Pt to receive custom L AFO before d/c to SNF.  See FIM for current functional status  Margaret Hess 07/22/2012, 12:27 PM

## 2012-07-22 NOTE — Progress Notes (Signed)
Social Work Discharge Note Discharge Note  The overall goal for the admission was met for:   Discharge location: Yes-[facility]  Length of Stay: Yes-27 DAYS  Discharge activity level: Yes-MIN LEVEL  Home/community participation: Yes  Services provided included: MD, RD, PT, OT, SLP, RN, TR, Pharmacy, Neuropsych and SW  Financial Services: Private Insurance: [place] [employer]  Follow-up services arranged: Other: SHORT TERM NHP  Comments (or additional information):PT VERY EXCITED TO BE LEAVING AND GOING TO THE NEXT VENUE FRIEND HERE TO ASSIST WITH HER TRANSFER  Patient/Family verbalized understanding of follow-up arrangements: Yes  Individual responsible for coordination of the follow-up plan: SELF & LYNN-SISTER  Confirmed correct DME delivered: Lucy Chris 07/22/2012    Lucy Chris

## 2012-07-22 NOTE — Progress Notes (Signed)
Patient discharged to [facility] via EMS at 1200. All questions from family and patient answered, understanding verbalized. No complaints of pain. Report called to [facility]. Hedy Camara

## 2012-07-22 NOTE — Progress Notes (Signed)
Recreational Therapy Discharge Summary Patient Details  Name: Margaret Hess MRN: [id number] Date of Birth: [birth year] Today's Date: 07/22/2012  Long term goals set: 2  Long term goals met: 1  Comments on progress toward goals: Pt is being discharged today to SNF for continued therapies and 24 hour care.  Pt requires min assist for simple TR tasks seated or standing along with verbal cuing to complete tasks safely.  Cueing needed for self expression, safety of LUE during functional tasks and for WBing through LLE when standing.  Pt did participate in community reintegration at w/c level with Min assist on level community surfaces.   Reasons for discharge: discharge from hospital   Follow-up: encourage participation in activities program at [facility]  Patient/family agrees with progress made and goals achieved: Yes  Parrish Bonn 07/22/2012, 4:58 PM

## 2012-07-22 NOTE — Progress Notes (Signed)
Patient ID: Margaret Hess, female   DOB: [birth year], 46 y.o.   MRN: [id number]  Subjective/Complaints: milder nerve pain on L side L arm spasms upon awakening,no dizziness UA C and S negative, still with urgency Reviewed BPs Review of Systems  Unable to perform ROS: medical condition   Objective: Vital Signs: Blood pressure 115/71, pulse 101, temperature 97.9 F (36.6 C), temperature source Oral, resp. rate 20, height 5\' 6"  (1.676 m), weight 77.9 kg (171 lb 11.8 oz), SpO2 97.00%. No results found. Results for orders placed during the hospital encounter of 06/25/12 (from the past 72 hour(s))  GLUCOSE, CAPILLARY     Status: Abnormal   Collection Time   07/19/12 11:18 AM      Component Value Range Comment   Glucose-Capillary 169 (*) 70 - 99 mg/dL    Comment 1 Notify RN     GLUCOSE, CAPILLARY     Status: Abnormal   Collection Time   07/19/12  4:25 PM      Component Value Range Comment   Glucose-Capillary 140 (*) 70 - 99 mg/dL   GLUCOSE, CAPILLARY     Status: Abnormal   Collection Time   07/19/12  9:30 PM      Component Value Range Comment   Glucose-Capillary 103 (*) 70 - 99 mg/dL    Comment 1 Notify RN     PRO B NATRIURETIC PEPTIDE     Status: Abnormal   Collection Time   07/20/12  6:00 AM      Component Value Range Comment   Pro B Natriuretic peptide (BNP) 891.1 (*) 0 - 125 pg/mL   PROTIME-INR     Status: Abnormal   Collection Time   07/20/12  6:00 AM      Component Value Range Comment   Prothrombin Time 24.9 (*) 11.6 - 15.2 seconds    INR 2.38 (*) 0.00 - 1.49   BASIC METABOLIC PANEL     Status: Abnormal   Collection Time   07/20/12  6:00 AM      Component Value Range Comment   Sodium 138  135 - 145 mEq/L    Potassium 4.5  3.5 - 5.1 mEq/L    Chloride 99  96 - 112 mEq/L    CO2 30  19 - 32 mEq/L    Glucose, Bld 145 (*) 70 - 99 mg/dL    BUN 14  6 - 23 mg/dL    Creatinine, Ser 4.09  0.50 - 1.10 mg/dL    Calcium 9.4  8.4 - 81.1 mg/dL    GFR calc non Af Amer >90  >90  mL/min    GFR calc Af Amer >90  >90 mL/min   GLUCOSE, CAPILLARY     Status: Abnormal   Collection Time   07/20/12  7:22 AM      Component Value Range Comment   Glucose-Capillary 150 (*) 70 - 99 mg/dL    Comment 1 Notify RN     GLUCOSE, CAPILLARY     Status: Abnormal   Collection Time   07/20/12 11:30 AM      Component Value Range Comment   Glucose-Capillary 180 (*) 70 - 99 mg/dL    Comment 1 Notify RN     GLUCOSE, CAPILLARY     Status: Abnormal   Collection Time   07/20/12  4:22 PM      Component Value Range Comment   Glucose-Capillary 104 (*) 70 - 99 mg/dL   URINALYSIS, ROUTINE W REFLEX MICROSCOPIC  Status: Abnormal   Collection Time   07/20/12  4:31 PM      Component Value Range Comment   Color, Urine YELLOW  YELLOW    APPearance CLOUDY (*) CLEAR    Specific Gravity, Urine 1.027  1.005 - 1.030    pH 6.0  5.0 - 8.0    Glucose, UA NEGATIVE  NEGATIVE mg/dL    Hgb urine dipstick MODERATE (*) NEGATIVE    Bilirubin Urine SMALL (*) NEGATIVE    Ketones, ur 15 (*) NEGATIVE mg/dL    Protein, ur NEGATIVE  NEGATIVE mg/dL    Urobilinogen, UA 1.0  0.0 - 1.0 mg/dL    Nitrite NEGATIVE  NEGATIVE    Leukocytes, UA LARGE (*) NEGATIVE   URINE CULTURE     Status: Normal   Collection Time   07/20/12  4:31 PM      Component Value Range Comment   Specimen Description URINE, CLEAN CATCH      Special Requests NONE      Culture  Setup Time 07/20/2012 18:18      Colony Count 30,000 COLONIES/ML      Culture        Value: Multiple bacterial morphotypes present, none predominant. Suggest appropriate recollection if clinically indicated.   Report Status 07/22/2012 FINAL     URINE MICROSCOPIC-ADD ON     Status: Abnormal   Collection Time   07/20/12  4:31 PM      Component Value Range Comment   Squamous Epithelial / LPF FEW (*) RARE    WBC, UA 11-20  <3 WBC/hpf    RBC / HPF 0-2  <3 RBC/hpf    Bacteria, UA FEW (*) RARE    Casts HYALINE CASTS (*) NEGATIVE    Urine-Other MUCOUS PRESENT       GLUCOSE, CAPILLARY     Status: Abnormal   Collection Time   07/20/12 10:59 PM      Component Value Range Comment   Glucose-Capillary 139 (*) 70 - 99 mg/dL    Comment 1 Notify RN     GLUCOSE, CAPILLARY     Status: Abnormal   Collection Time   07/21/12  7:10 AM      Component Value Range Comment   Glucose-Capillary 132 (*) 70 - 99 mg/dL   GLUCOSE, CAPILLARY     Status: Abnormal   Collection Time   07/21/12 11:32 AM      Component Value Range Comment   Glucose-Capillary 192 (*) 70 - 99 mg/dL    Comment 1 Notify RN     GLUCOSE, CAPILLARY     Status: Abnormal   Collection Time   07/21/12  4:16 PM      Component Value Range Comment   Glucose-Capillary 109 (*) 70 - 99 mg/dL    Comment 1 Notify RN     GLUCOSE, CAPILLARY     Status: Abnormal   Collection Time   07/21/12  9:39 PM      Component Value Range Comment   Glucose-Capillary 129 (*) 70 - 99 mg/dL   PROTIME-INR     Status: Abnormal   Collection Time   07/22/12  5:58 AM      Component Value Range Comment   Prothrombin Time 22.6 (*) 11.6 - 15.2 seconds    INR 2.09 (*) 0.00 - 1.49   GLUCOSE, CAPILLARY     Status: Abnormal   Collection Time   07/22/12  7:28 AM      Component Value Range Comment  Glucose-Capillary 146 (*) 70 - 99 mg/dL    Comment 1 Notify RN        HEENT: normal Cardio: RRR Resp: CTA B/L GI: BS positive Extremity:  No Edema Skin:   Intact Neuro: Abnormal Sensory absent light touch and proprio, positive deep pain, Abnormal Motor 0/5 in LUE and LLE, Abnormal FMC Tone  reduced on L side, Tone:  Hypotonia, Aphasic, Inattention and Apraxic Musc/Skel:  Swelling L dorsum hand ecchymosis and swelling-mild Increased L hamstrings and hip adductor tone--unchanged  Assessment/Plan: 1. Functional deficits secondary to R MCA infarct L HP, crossed aphasia,apraxia  Stable for D/C  Ordered L AFO PMR f/u 3weeks  MD at SNF to follow    FIM: FIM - Bathing Bathing Steps Patient Completed: Chest;Right Arm;Left  Arm;Abdomen;Front perineal area;Buttocks;Right upper leg;Left upper leg;Right lower leg (including foot);Left lower leg (including foot) Bathing: 4: Steadying assist  FIM - Upper Body Dressing/Undressing Upper body dressing/undressing steps patient completed: Thread/unthread left sleeve of pullover shirt/dress;Thread/unthread right sleeve of pullover shirt/dresss;Put head through opening of pull over shirt/dress;Pull shirt over trunk Upper body dressing/undressing: 5: Supervision: Safety issues/verbal cues FIM - Lower Body Dressing/Undressing Lower body dressing/undressing steps patient completed: Pull underwear up/down;Thread/unthread right underwear leg;Thread/unthread left underwear leg;Thread/unthread right pants leg;Thread/unthread left pants leg;Pull pants up/down Lower body dressing/undressing: 4: Steadying Assist  FIM - Toileting Toileting steps completed by patient: Performs perineal hygiene;Adjust clothing prior to toileting;Adjust clothing after toileting Toileting Assistive Devices: Grab bar or rail for support Toileting: 4: Steadying assist  FIM - Diplomatic Services operational officer Devices: Grab bars Toilet Transfers: 4-To toilet/BSC: Min A (steadying Pt. > 75%);4-From toilet/BSC: Min A (steadying Pt. > 75%)  FIM - Bed/Chair Transfer Bed/Chair Transfer Assistive Devices: Bed rails;Arm rests Bed/Chair Transfer: 4: Supine > Sit: Min A (steadying Pt. > 75%/lift 1 leg);4: Sit > Supine: Min A (steadying pt. > 75%/lift 1 leg);4: Bed > Chair or W/C: Min A (steadying Pt. > 75%);4: Chair or W/C > Bed: Min A (steadying Pt. > 75%)  FIM - Locomotion: Wheelchair Distance: 150 Locomotion: Wheelchair: 2: Travels 50 - 149 ft with minimal assistance (Pt.>75%) FIM - Locomotion: Ambulation Locomotion: Ambulation Assistive Devices: Programmer, systems - Platform Ambulation/Gait Assistance: 3: Mod assist Locomotion: Ambulation: 2: Travels 50 - 149 ft with moderate assistance (Pt: 50  - 74%)  Comprehension Comprehension Mode: Auditory Comprehension: 5-Understands complex 90% of the time/Cues < 10% of the time  Expression Expression Mode: Verbal Expression: 4-Expresses basic 75 - 89% of the time/requires cueing 10 - 24% of the time. Needs helper to occlude trach/needs to repeat words.  Social Interaction Social Interaction Mode: Asleep Social Interaction: 6-Interacts appropriately with others with medication or extra time (anti-anxiety, antidepressant).  Problem Solving Problem Solving Mode: Asleep Problem Solving: 5-Solves basic 90% of the time/requires cueing < 10% of the time  Memory Memory Mode: Asleep Memory: 5-Recognizes or recalls 90% of the time/requires cueing < 10% of the time  Medical Problem List and Plan:  1. 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 DVT Prophylaxis/Anticoagulation: Chronic Coumadin therapy with a goal INR of 2-2.5 secondary hemorrhagic transformation  3. Neuropsych: This patient is not capable of making decisions on his/her own behalf.  4. 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 Monitor with increased mobility. Cardiology to follow up as needed(Dr. Bensimhon), TED hose when OOB Discussed with cardiology today no changes  5. Diabetes mellitus. Patient currently on sliding scale insulin. Patient on Glucophage 500 mg twice a day prior to admission and victoza 1.2 mL subcutaneously daily. Check blood sugars a.c. and at bedtime, Is on hi dose metformin 1000mg  BID with controlled CBGs.  If this fails then restart Victoza--improved control at present 6. Mood/depression. Patient on Wellbutrin prior to admission 75 mg daily. We'll discuss resuming this medication. Provide emotional support and positive reinforcement  7. Sensory dysesthesia-movement allodynia related to parietal infarct and  hemorrhagic transformation, increased gabapentin- , increase to 800mg  monitor sedation, better on this dose 8.  Back pain mild/mod will use K pad and analgesic balm  -discussed Vonna Kotyk back cushion with PT 9.  Spasticity  off Zanaflex probable cause of orthostasis, monitor consider baclofen if tone increased 10.Urinary freq, Urine cx neg, prob spastic bladder from CVA  LOS (Days) 27 A FACE TO FACE EVALUATION WAS PERFORMED  Lynel Forester E 07/22/2012, 8:10 AM

## 2012-07-22 NOTE — Progress Notes (Signed)
ANTICOAGULATION CONSULT NOTE - Follow Up Consult  Pharmacy Consult for Coumadin Indication: LV thrombus/ secondary stroke prophylaxis  No Known Allergies  Patient Measurements: Height: 5\' 6"  (167.6 cm) Weight: 171 lb 11.8 oz (77.9 kg) IBW/kg (Calculated) : 59.3   Vital Signs: Temp: 97.9 F (36.6 C) (11/20 0604) Temp src: Oral (11/20 0604) BP: 115/71 mmHg (11/20 0609) Pulse Rate: 101  (11/20 0609)  Labs:  Basename 07/22/12 0558 07/20/12 0600  HGB -- --  HCT -- --  PLT -- --  APTT -- --  LABPROT 22.6* 24.9*  INR 2.09* 2.38*  HEPARINUNFRC -- --  CREATININE -- 0.68  CKTOTAL -- --  CKMB -- --  TROPONINI -- --    Estimated Creatinine Clearance: 92.5 ml/min (by C-G formula based on Cr of 0.68).  Assessment: 46 y.o. F discharged from [facility] on 10/18 on coumadin 5mg  daily and lovenox 80mg  q12 for new LV thrombus. Returns to the hospital that same night with difficulty speaking and L sided weakness. CT head negative but MRI brain showed large R-MCA that later underwent hemorrhagic transformation   Warfarin resumed per recommendations of neuro/stroke team. INR today remains therapeutic. No CBC today, no s/sx of bleeding noted  Goal of Therapy:  INR 2-2.5 (lower goal d/t hemorrhagic transformation of CVA)   Plan:  1. Continue warfarin 2.5 mg daily 2. Will continue to monitor for any signs/symptoms of bleeding and will follow up with PT/INR on MWF  Lysle Pearl, PharmD, BCPS Pager # [phone] 07/22/2012 11:26 AM

## 2012-07-29 ENCOUNTER — Encounter (HOSPITAL_COMMUNITY): Payer: Self-pay

## 2012-07-29 ENCOUNTER — Ambulatory Visit (HOSPITAL_COMMUNITY)
Admit: 2012-07-29 | Discharge: 2012-07-29 | Disposition: A | Discharge status: Home / Self Care | Payer: BC Managed Care – PPO | Attending: Internal Medicine | Admitting: Internal Medicine

## 2012-07-29 VITALS — BP 138/84 | HR 100 | Wt 166.0 lb

## 2012-07-29 DIAGNOSIS — I5022 Chronic systolic (congestive) heart failure: Secondary | ICD-10-CM

## 2012-07-29 DIAGNOSIS — I240 Acute coronary thrombosis not resulting in myocardial infarction: Secondary | ICD-10-CM

## 2012-07-29 DIAGNOSIS — I5189 Other ill-defined heart diseases: Secondary | ICD-10-CM

## 2012-07-29 MED ORDER — CARVEDILOL 6.25 MG PO TABS: 9.3750 mg | ORAL_TABLET | Freq: Two times a day (BID) | ORAL | Status: DC

## 2012-07-29 NOTE — Progress Notes (Signed)
Patient ID: Margaret Hess, female   DOB: 13-Jan-1966, 46 y.o.   MRN: [id number] HPI: 46 yo woman with history of DM, HTN, NICM, EF <20%. LV thrombus, and Grade 3 diastolic dysfunction. Mod TR. Discharged from [facility] 06/19/12 after being treated for acute systolic heart failure and LV thrombus. Discharged on loveneox and coumadin.   Presented to ED 06/20/12 with difficulty speaking and L sided weakness. CT of head negative. 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 Discharged from acute care to [facility] rehab. Later discharged to [facility] for ongoing rehab 07/21/2012. Discharge weight 171 pounds.   07/07/12 ECHO EF 20-25% grade II diastolic dysfunction   She returns for post hospital follow up. Feeling ok with intermittent dizziness. Denies SOB/PND/Orthopnea. Currently resides at [facility] for rehab. Weights not provided by [employer]. She continues on coumadin daily. Most recent INR 2.0.      ROS: All systems negative except as listed in HPI, PMH and Problem List.  Past Medical History  Diagnosis Date  . Migraines   . Hypertension   . Diabetes mellitus     Current Outpatient Prescriptions  Medication Sig Dispense Refill  . carvedilol (COREG) 6.25 MG tablet Take 1.5 tablets (9.375 mg total) by mouth 2 (two) times daily with a meal.  60 tablet  4  . ciprofloxacin (CIPRO) 500 MG tablet Take 500 mg by mouth 2 (two) times daily.      . digoxin (LANOXIN) 0.25 MG tablet Take 1 tablet (0.25 mg total) by mouth daily.  30 tablet  0  . gabapentin (NEURONTIN) 600 MG tablet Take 600 mg by mouth 3 (three) times daily.      Marland Kitchen lisinopril (PRINIVIL,ZESTRIL) 10 MG tablet Take 2.5 mg by mouth daily.      . metFORMIN (GLUCOPHAGE) 500 MG tablet Take 1,000 mg by mouth 2 (two) times daily with a meal.       . pantoprazole (PROTONIX) 40 MG tablet Take 40 mg by mouth  daily.      Marland Kitchen saccharomyces boulardii (FLORASTOR) 250 MG capsule Take 250 mg by mouth 2 (two) times daily.      Marland Kitchen spironolactone (ALDACTONE) 25 MG tablet Take 1 tablet (25 mg total) by mouth daily.  30 tablet  0  . zolpidem (AMBIEN) 10 MG tablet Take 5 mg by mouth at bedtime as needed. For sleep      . [DISCONTINUED] carvedilol (COREG) 3.125 MG tablet Take 1 tablet (3.125 mg total) by mouth 2 (two) times daily with a meal.  30 tablet  0  . [DISCONTINUED] carvedilol (COREG) 3.125 MG tablet Take 6.25 mg by mouth 2 (two) times daily with a meal.      . [DISCONTINUED] lisinopril (PRINIVIL,ZESTRIL) 10 MG tablet Take 1 tablet (10 mg total) by mouth 2 (two) times daily.  30 tablet  0  . buPROPion (WELLBUTRIN) 75 MG tablet Take 75 mg by mouth daily.       Marland Kitchen docusate sodium (COLACE) 100 MG capsule Take 1 capsule (100 mg total) by mouth 2 (two) times daily.  10 capsule  0  . DULoxetine (CYMBALTA) 20 MG capsule Take 20 mg by mouth daily.      Marland Kitchen enoxaparin (LOVENOX) 80 MG/0.8ML injection Inject 80mg  SQ q 12 hours for 5 doses.  5 Syringe  0  . warfarin (COUMADIN) 5 MG tablet Take 1 tablet (5 mg  total) by mouth daily.  2 tablet  0     PHYSICAL EXAM: Filed Vitals:   07/29/12 1358  BP: 138/84  Pulse: 100   Weight change:  General:  Well appearing. No resp difficulty in wheelchair HEENT: normal Neck: supple. JVP flat. Carotids 2+ bilaterally; no bruits. No lymphadenopathy or thryomegaly appreciated. Cor: PMI normal. Regular rate & rhythm. No rubs, gallops or murmurs. Lungs: clear Abdomen: soft, nontender, nondistended. No hepatosplenomegaly. No bruits or masses. Good bowel sounds. Extremities: no cyanosis, clubbing, rash, edema. RLE brace in place Neuro: alert & orientedx3, cranial nerves grossly intact. Moves all 4 extremities w/o difficulty. Affect pleasant.      ASSESSMENT & PLAN:

## 2012-07-29 NOTE — Assessment & Plan Note (Addendum)
Difficult to determine functional class as she is currently in wheelchair and she has ongoing difficulty finding words. Volume status stable. Increase carvedilol 9.375 mg twice a day. Requested SNF weight and record daily and fax weight charts to HF clinic every Monday. Ace inhibitor not titrated as she had problems with orthostatic hypertension and it was later reduced. Follow up in 1 month and continue to titrate HF medications as she tolerates.   Patient seen and examined with Tonye Becket, NP. We discussed all aspects of the encounter. I agree with the assessment and plan as stated above.Still with significant expressive aphasia and L-sided weakness. Volume status looks good. BP soft. Agree with increasing cardvedilol as tolerated. Communicated with SNF about HF management.

## 2012-07-29 NOTE — Patient Instructions (Addendum)
Follow up in 1 month  Take Carvedilol 9.375 mg twice a day  Do the following things EVERYDAY: 1) Weigh yourself in the morning before breakfast. Write it down and keep it in a log. 2) Take your medicines as prescribed 3) Eat low salt foods-Limit salt (sodium) to 2000 mg per day.  4) Stay as active as you can everyday 5) Limit all fluids for the day to less than 2 liters

## 2012-08-01 NOTE — Assessment & Plan Note (Signed)
Attending: LV thrombus not apparent on last echo. Given persistent severe LV dysfunction and recent CVA will continue coumadin.

## 2012-08-10 ENCOUNTER — Encounter: Payer: BC Managed Care – PPO | Attending: Physical Medicine & Rehabilitation

## 2012-08-10 ENCOUNTER — Encounter: Payer: Self-pay | Admitting: Physical Medicine & Rehabilitation

## 2012-08-10 ENCOUNTER — Ambulatory Visit (HOSPITAL_BASED_OUTPATIENT_CLINIC_OR_DEPARTMENT_OTHER): Payer: BC Managed Care – PPO | Admitting: Physical Medicine & Rehabilitation

## 2012-08-10 VITALS — BP 125/64 | HR 84 | Resp 14 | Ht 66.0 in | Wt 167.0 lb

## 2012-08-10 DIAGNOSIS — I10 Essential (primary) hypertension: Secondary | ICD-10-CM | POA: Insufficient documentation

## 2012-08-10 DIAGNOSIS — E1149 Type 2 diabetes mellitus with other diabetic neurological complication: Secondary | ICD-10-CM | POA: Insufficient documentation

## 2012-08-10 DIAGNOSIS — G8110 Spastic hemiplegia affecting unspecified side: Secondary | ICD-10-CM

## 2012-08-10 DIAGNOSIS — I639 Cerebral infarction, unspecified: Secondary | ICD-10-CM

## 2012-08-10 DIAGNOSIS — I69959 Hemiplegia and hemiparesis following unspecified cerebrovascular disease affecting unspecified side: Secondary | ICD-10-CM | POA: Insufficient documentation

## 2012-08-10 DIAGNOSIS — I69920 Aphasia following unspecified cerebrovascular disease: Secondary | ICD-10-CM | POA: Insufficient documentation

## 2012-08-10 DIAGNOSIS — I6340 Cerebral infarction due to embolism of unspecified cerebral artery: Secondary | ICD-10-CM

## 2012-08-10 DIAGNOSIS — E1142 Type 2 diabetes mellitus with diabetic polyneuropathy: Secondary | ICD-10-CM | POA: Insufficient documentation

## 2012-08-10 NOTE — Progress Notes (Signed)
Subjective:    Patient ID: Margaret Hess, female    DOB: [birth year], 46 y.o.   MRN: [id number] This is a 46 year old right-handed female with history of migraine  headaches, hypertension, diabetes mellitus with peripheral neuropathy,  and congestive heart failure, who presented June 15, 2012 with  dyspnea, found to have nonischemic cardiomyopathy with ejection fraction  of 17%. An echocardiogram completed showing severe global hypokinesis  with normal left ventricular cavity size and a left ventricular apical  thrombus as well as grade III diastolic dysfunction. The patient had  MRI on October 16 which confirmed a small apical left ventricular  thrombus. She was placed on Coumadin therapy, discharged from the  hospital June 19, 2012 once her INR was therapeutic. She was also on  prescription for 3 days of coverage with Lovenox 80 mg twice daily. On  June 19, 2012, she developed acute onset left-sided weakness and  difficulty speaking. She was readmitted to the hospital October 19 with  MRI of the brain showing acute right MCA infarct with cerebral edema,  mild midline shift, and hemorrhagic transformation. MRA of the head  showed acute occlusion of the right M1 segment compatible with embolus  causing right MCA infarction. Carotid Dopplers October 20 showing no  significant ICA stenosis.  DATE OF ADMISSION: 06/25/2012  DATE OF DISCHARGE: 07/22/2012  HPI [name] 5 days of therapy per week Pain Inventory Average Pain 3 Pain Right Now 1 My pain is dull  In the last 24 hours, has pain interfered with the following? General activity 3 Relation with others 2 Enjoyment of life 6 What TIME of day is your pain at its worst? night Sleep (in general) Fair  Pain is worse with: unsure Pain improves with: medication Relief from Meds: 6  Mobility use a cane how many minutes can you walk? 10 ability to climb steps?  yes do you drive?   no transfers alone Do you have any goals in this area?  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  yes  Neuro/Psych bladder control problems bowel control problems weakness trouble walking spasms confusion  Prior Studies Any changes since last visit?  no  Physicians involved in your care Any changes since last visit?  no   Family History  Problem Relation Age of Onset  . Multiple myeloma Mother   . Heart disease Father   . Hypertension Father    History   Social History  . Marital Status: Single    Spouse Name: N/A    Number of Children: N/A  . Years of Education: N/A   Social History Main Topics  . Smoking status: Never Smoker   . Smokeless tobacco: Never Used  . Alcohol Use: Yes     Comment: 1 mixed drink every 6 months  . Drug Use: No  . Sexually Active: Yes    Birth Control/ Protection: Inserts   Other Topics Concern  . None   Social History Narrative  . None   Past Surgical History  Procedure Date  . Wisdom tooth extraction    Past Medical History  Diagnosis Date  . Migraines   . Hypertension   . Diabetes mellitus   . Stroke    BP 125/64  Pulse 84  Resp 14  Ht 5\' 6"  (  1.676 m)  Wt 167 lb (75.751 kg)  BMI 26.95 kg/m2  SpO2 99%     Review of Systems  Gastrointestinal: Positive for diarrhea.  Musculoskeletal: Positive for gait problem.  Neurological: Positive for weakness.  Psychiatric/Behavioral: Positive for confusion.  All other systems reviewed and are negative.       Objective:   Physical Exam  Constitutional: She is oriented to person, place, and time. She appears well-developed and well-nourished.  HENT:  Head: Normocephalic and atraumatic.  Eyes: Conjunctivae normal and EOM are normal. Pupils are equal,  round, and reactive to light.  Musculoskeletal:       Left shoulder: She exhibits decreased range of motion, deformity and pain.       1 finger breath subluxation Pain with range of motion mainly with adduction  Neurological: She is alert and oriented to person, place, and time. A sensory deficit is present. She exhibits abnormal muscle tone. Coordination and gait abnormal.       Motor strength is 0/5 in the left deltoid, biceps, triceps, grip 0/5 in the left ankle dorsiflexor 2 minus at the hip flexor and knee extensor  Ashworth grade 3 at the Finger and wrist flexor on the left side as well as thumb flexor   Psychiatric: She has a normal mood and affect.          Assessment & Plan:  1. Right MCA infarct in a right-hand dominant patient do to thromboembolic stroke. Has aphasia and left spastic hemiparesis In regards to her spasticity will set her up for Botox injection 200 units into the wrist and finger flexors Continue skilled nursing facility level PT OT and speech  In regards to shoulder pain continue strengthening may need to do shoulder injection at some point, may benefit from FES

## 2012-08-20 ENCOUNTER — Telehealth: Payer: Self-pay | Admitting: *Deleted

## 2012-08-20 ENCOUNTER — Encounter (HOSPITAL_COMMUNITY): Payer: Self-pay

## 2012-08-20 ENCOUNTER — Ambulatory Visit (HOSPITAL_COMMUNITY)
Admission: RE | Admit: 2012-08-20 | Discharge: 2012-08-20 | Disposition: A | Discharge status: Home / Self Care | Payer: BC Managed Care – PPO | Source: Ambulatory Visit | Attending: Internal Medicine | Admitting: Internal Medicine

## 2012-08-20 ENCOUNTER — Encounter: Payer: Self-pay | Admitting: Physical Medicine & Rehabilitation

## 2012-08-20 VITALS — BP 122/86 | HR 98 | Wt 162.0 lb

## 2012-08-20 DIAGNOSIS — I5022 Chronic systolic (congestive) heart failure: Secondary | ICD-10-CM | POA: Insufficient documentation

## 2012-08-20 DIAGNOSIS — I5189 Other ill-defined heart diseases: Secondary | ICD-10-CM

## 2012-08-20 DIAGNOSIS — R002 Palpitations: Secondary | ICD-10-CM

## 2012-08-20 DIAGNOSIS — I240 Acute coronary thrombosis not resulting in myocardial infarction: Secondary | ICD-10-CM

## 2012-08-20 HISTORY — DX: Palpitations: R00.2

## 2012-08-20 MED ORDER — LISINOPRIL 2.5 MG PO TABS: 2.5000 mg | ORAL_TABLET | Freq: Two times a day (BID) | ORAL | Status: DC

## 2012-08-20 NOTE — Assessment & Plan Note (Addendum)
NYHA II-III. Volume status stable. Reviewed most recent lab work from [facility]. Renal function stable. Add 2.5 mg lisinopril at night. SNF to notify if she is hypotensive. Follow up in 1 month.  Patient seen and examined with Tonye Becket, NP. We discussed all aspects of the encounter. I agree with the assessment and plan as stated above. She is doing fairly well. Functional status hard to assess due to major CVA. Volume status looks OK. Will try to titrate lisinopril as BP tolerates.

## 2012-08-20 NOTE — Progress Notes (Signed)
Patient ID: Margaret Hess, female   DOB: May 17, 1966, 46 y.o.   MRN: [id number] HPI: 46 yo woman with history of DM, HTN, NICM, EF <20%. LV thrombus, and Grade 3 diastolic dysfunction. Mod TR. Discharged from [facility] 06/19/12 after being treated for acute systolic heart failure and LV thrombus. Discharged on loveneox and coumadin.   Presented to ED 06/20/12 with difficulty speaking and L sided weakness. CT of head negative. 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 Discharged from acute care to [facility] rehab. Later discharged to [facility] for ongoing rehab 07/21/2012. Discharge weight 171 pounds.   07/07/12 ECHO EF 20-25% grade II diastolic dysfunction  08/18/12 CXR No cardiomegaly, congestion, or effusion.  08/18/12 Potassium 4.0 Cratinine 0.41   She returns for follow up. Last visit carvedilol increased to 9.375 mg twice a dad. On 08/18/12 she complained of fatigue and palpitations. Ongoing loose bowel movements 3 times a day. decreaed appetite. Denies SOB/PND/Orthopnea. Complains of on going dizziness when she walks. Currently resides at [facility] for rehab. Weights not provided by [employer]. She continues on coumadin daily. No problems with bleeding.     ROS: All systems negative except as listed in HPI, PMH and Problem List.  Past Medical History  Diagnosis Date  . Migraines   . Hypertension   . Diabetes mellitus   . Stroke     Current Outpatient Prescriptions  Medication Sig Dispense Refill  . carvedilol (COREG) 6.25 MG tablet Take 1.5 tablets (9.375 mg total) by mouth 2 (two) times daily with a meal.  60 tablet  4  . digoxin (LANOXIN) 0.25 MG tablet Take 0.125 mg by mouth daily.      Marland Kitchen gabapentin (NEURONTIN) 600 MG tablet Take 600 mg by mouth 3 (three) times daily.      . insulin detemir (LEVEMIR) 100 UNIT/ML injection Inject into the  skin at bedtime.      Marland Kitchen lisinopril (PRINIVIL,ZESTRIL) 2.5 MG tablet Take 2.5 mg by mouth daily.      . metFORMIN (GLUCOPHAGE) 500 MG tablet Take 1,000 mg by mouth 2 (two) times daily with a meal.       . ONE TOUCH ULTRA TEST test strip       . pantoprazole (PROTONIX) 40 MG tablet Take 40 mg by mouth daily.      Marland Kitchen spironolactone (ALDACTONE) 25 MG tablet Take 1 tablet (25 mg total) by mouth daily.  30 tablet  0  . warfarin (COUMADIN) 5 MG tablet Take 1 tablet (5 mg total) by mouth daily.  2 tablet  0  . zolpidem (AMBIEN) 10 MG tablet Take 5 mg by mouth at bedtime as needed. For sleep         PHYSICAL EXAM: Filed Vitals:   08/20/12 1526  BP: 122/86  Pulse: 98   Weight change:  General:  Well appearing. No resp difficulty in wheelchair (friend present) HEENT: normal Neck: supple. JVP flat. Carotids 2+ bilaterally; no bruits. No lymphadenopathy or thryomegaly appreciated. Cor: PMI normal. Regular rate & rhythm. No rubs, gallops or murmurs. Lungs: clear Abdomen: soft, nontender, nondistended. No hepatosplenomegaly. No bruits or masses. Good bowel sounds. Extremities: no cyanosis, clubbing, rash, edema. RLE brace in place  Ted hose present bilaterally Neuro: alert & orientedx3, cranial nerves grossly intact. Moves all 4 extremities w/o difficulty. Affect pleasant.  EKG: NSR 95 bpm    ASSESSMENT &  PLAN:

## 2012-08-20 NOTE — Assessment & Plan Note (Signed)
Continue coumadin.  

## 2012-08-20 NOTE — Telephone Encounter (Signed)
Message was received from my chart from Ms Billig requesting her MRI images be sent to her computer. The request encounter was closed inadvertently.  Is this possible?

## 2012-08-20 NOTE — Assessment & Plan Note (Addendum)
Daily palpitations. Place Holter monitor for 48 hours.  Attending: I am concerned she may be having NSVT. However she did not have much ectopy while in the hospital. Will place 48-hour holter. May need to consider ICD if EF not improving.

## 2012-08-21 NOTE — Addendum Note (Signed)
Encounter addended by: Deitra Mayo on: 08/21/2012  8:12 AM<BR>     Documentation filed: Charges VN

## 2012-08-21 NOTE — Telephone Encounter (Signed)
Please advise MRI results. 

## 2012-08-24 NOTE — Telephone Encounter (Signed)
Ok to release.  I am not sure how to do this however

## 2012-08-27 NOTE — Telephone Encounter (Signed)
Tried contacting patient regarding her request.  I have sent and email to find out if her MRI results can be posted to her my chart.

## 2012-08-28 NOTE — Telephone Encounter (Signed)
Left message for patient to call office regarding MRI request.

## 2012-08-31 ENCOUNTER — Encounter (INDEPENDENT_AMBULATORY_CARE_PROVIDER_SITE_OTHER): Payer: BC Managed Care – PPO

## 2012-08-31 ENCOUNTER — Telehealth: Payer: Self-pay | Admitting: *Deleted

## 2012-08-31 DIAGNOSIS — R002 Palpitations: Secondary | ICD-10-CM

## 2012-08-31 NOTE — Telephone Encounter (Signed)
48 hr Holter monitor placed on Pt. 08/31/12

## 2012-09-21 ENCOUNTER — Ambulatory Visit (HOSPITAL_COMMUNITY)
Admission: RE | Admit: 2012-09-21 | Discharge: 2012-09-21 | Disposition: A | Discharge status: Home / Self Care | Payer: BC Managed Care – PPO | Source: Ambulatory Visit | Attending: Internal Medicine | Admitting: Internal Medicine

## 2012-09-21 VITALS — BP 130/72 | HR 72 | Wt 155.5 lb

## 2012-09-21 DIAGNOSIS — I240 Acute coronary thrombosis not resulting in myocardial infarction: Secondary | ICD-10-CM

## 2012-09-21 DIAGNOSIS — I5022 Chronic systolic (congestive) heart failure: Secondary | ICD-10-CM

## 2012-09-21 DIAGNOSIS — I5189 Other ill-defined heart diseases: Secondary | ICD-10-CM

## 2012-09-21 DIAGNOSIS — I10 Essential (primary) hypertension: Secondary | ICD-10-CM

## 2012-09-21 LAB — BASIC METABOLIC PANEL
BUN: 11 mg/dL (ref 6–23)
Calcium: 9.8 mg/dL (ref 8.4–10.5)
Creatinine, Ser: 0.41 mg/dL — ABNORMAL LOW (ref 0.50–1.10)
GFR calc non Af Amer: >90 mL/min (ref >90)
Glucose, Bld: 143 mg/dL — ABNORMAL HIGH (ref 70–99)

## 2012-09-21 MED ORDER — LISINOPRIL 10 MG PO TABS: 5.0000 mg | ORAL_TABLET | Freq: Every day | ORAL | Status: DC

## 2012-09-21 NOTE — Progress Notes (Signed)
PCP: Dr. Piedad Climes   HPI: 47 yo woman with history of DM, HTN, NICM, EF <20%. LV thrombus, and Grade 3 diastolic dysfunction. Mod TR. Discharged from [facility] 06/19/12 after being treated for acute systolic heart failure and LV thrombus on coumadin.   Presented to ED 06/20/12 with difficulty speaking and L sided weakness. CT of head negative. 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 Discharged from acute care to [facility] rehab. Later discharged to [facility] for ongoing rehab 07/21/2012. Discharge weight 171 pounds.   07/07/12 ECHO EF 20-25% grade II diastolic dysfunction   She returns for follow up. She is feeling well.  Lives at home.  Working with PT/OT and speech 4 days a week.  She denies edema.  No dizziness/syncope.  No chest pain.  No more palpitations.   Holter monitor reviewed, SR with PVCs.    ROS: All systems negative except as listed in HPI, PMH and Problem List.  Past Medical History  Diagnosis Date  . Migraines   . Hypertension   . Diabetes mellitus   . Stroke     Current Outpatient Prescriptions  Medication Sig Dispense Refill  . carvedilol (COREG) 6.25 MG tablet Take 1.5 tablets (9.375 mg total) by mouth 2 (two) times daily with a meal.  60 tablet  4  . citalopram (CELEXA) 10 MG tablet Take 10 mg by mouth daily.      . clonazePAM (KLONOPIN) 0.5 MG tablet Take 0.5 mg by mouth 2 (two) times daily as needed.      . digoxin (LANOXIN) 0.25 MG tablet Take 0.125 mg by mouth daily.      Marland Kitchen gabapentin (NEURONTIN) 600 MG tablet Take 600 mg by mouth 3 (three) times daily.      Marland Kitchen HYDROcodone-acetaminophen (NORCO/VICODIN) 5-325 MG per tablet Take 1 tablet by mouth every 6 (six) hours as needed.      . insulin detemir (LEVEMIR) 100 UNIT/ML injection Inject 10 Units into the skin at bedtime.       Marland Kitchen lisinopril (PRINIVIL,ZESTRIL) 2.5 MG tablet Take 2.5  mg by mouth daily.      . metFORMIN (GLUCOPHAGE) 500 MG tablet Take 1,000 mg by mouth 2 (two) times daily with a meal.       . ONE TOUCH ULTRA TEST test strip       . pantoprazole (PROTONIX) 40 MG tablet Take 40 mg by mouth daily.      Marland Kitchen spironolactone (ALDACTONE) 25 MG tablet Take 1 tablet (25 mg total) by mouth daily.  30 tablet  0  . warfarin (COUMADIN) 5 MG tablet Take 1 tablet (5 mg total) by mouth daily.  2 tablet  0  . zolpidem (AMBIEN) 10 MG tablet Take 5 mg by mouth at bedtime as needed. For sleep         PHYSICAL EXAM: Filed Vitals:   09/21/12 1422  BP: 130/72  Pulse: 72  Weight: 155 lb 8 oz (70.534 kg)  SpO2: 98%    General:  Well appearing. No resp difficulty in wheelchair (friend present) HEENT: normal Neck: supple. JVP flat. Carotids 2+ bilaterally; no bruits. No lymphadenopathy or thryomegaly appreciated. Cor: PMI normal. Regular rate & rhythm. No rubs, gallops or murmurs. Lungs: clear Abdomen: soft, nontender, nondistended. No hepatosplenomegaly. No bruits or masses. Good bowel sounds. Extremities: no cyanosis, clubbing, rash, edema. RLE brace in place  Ted hose present  bilaterally Neuro: alert & orientedx3, cranial nerves grossly intact. Moves all 4 extremities w/o difficulty. Affect pleasant.      ASSESSMENT & PLAN:

## 2012-09-21 NOTE — Patient Instructions (Addendum)
Increase lisinopril 5 mg (0.5 tab) daily.    Labs today.  Follow up 3-4 weeks with echo.

## 2012-09-22 ENCOUNTER — Emergency Department (HOSPITAL_BASED_OUTPATIENT_CLINIC_OR_DEPARTMENT_OTHER)
Admission: EM | Admit: 2012-09-22 | Discharge: 2012-09-22 | Disposition: A | Discharge status: Home / Self Care | Payer: BC Managed Care – PPO | Attending: Emergency Medicine | Admitting: Emergency Medicine

## 2012-09-22 ENCOUNTER — Encounter (HOSPITAL_BASED_OUTPATIENT_CLINIC_OR_DEPARTMENT_OTHER): Payer: Self-pay | Admitting: *Deleted

## 2012-09-22 ENCOUNTER — Telehealth (HOSPITAL_COMMUNITY): Payer: Self-pay | Admitting: Cardiology

## 2012-09-22 DIAGNOSIS — D689 Coagulation defect, unspecified: Secondary | ICD-10-CM | POA: Insufficient documentation

## 2012-09-22 DIAGNOSIS — Z79899 Other long term (current) drug therapy: Secondary | ICD-10-CM | POA: Insufficient documentation

## 2012-09-22 DIAGNOSIS — I10 Essential (primary) hypertension: Secondary | ICD-10-CM | POA: Insufficient documentation

## 2012-09-22 DIAGNOSIS — Z7901 Long term (current) use of anticoagulants: Secondary | ICD-10-CM | POA: Insufficient documentation

## 2012-09-22 DIAGNOSIS — E119 Type 2 diabetes mellitus without complications: Secondary | ICD-10-CM | POA: Insufficient documentation

## 2012-09-22 DIAGNOSIS — Z794 Long term (current) use of insulin: Secondary | ICD-10-CM | POA: Insufficient documentation

## 2012-09-22 DIAGNOSIS — Z8679 Personal history of other diseases of the circulatory system: Secondary | ICD-10-CM | POA: Insufficient documentation

## 2012-09-22 DIAGNOSIS — Z8673 Personal history of transient ischemic attack (TIA), and cerebral infarction without residual deficits: Secondary | ICD-10-CM | POA: Insufficient documentation

## 2012-09-22 MED ORDER — FUROSEMIDE 20 MG PO TABS: 20.0000 mg | ORAL_TABLET | Freq: Every day | ORAL | Status: DC | PRN

## 2012-09-22 NOTE — ED Notes (Signed)
Pt sent in by home health nurse for report of "PT 1.8/ INR 18.2"- pt has recently been through rehab for stroke and has residual left side weakness- cao x 4- denies pain, dizziness, new symptoms. Reports she was instructed to come to ed for her blood being "too thin"

## 2012-09-22 NOTE — ED Provider Notes (Signed)
History     CSN: [id number]  Arrival date & time 09/22/12  1409   None     Chief Complaint  Patient presents with  . Coagulation Disorder    (Consider location/radiation/quality/duration/timing/severity/associated sxs/prior treatment) HPI Comments: Pt sent here by [facility] for evaluation.   Pt reports she was told her INR was 18.  Home health nurse called Pt's physician and was told to come here.  Pt was told that she needed a shot of vitamin K.   Pt has 1.8 and 18.2 written down.  Pt denies any complaints.  No shortness of breath.  No chest pain.  No swelling.  Pt did have 3 pd wt gain but ate pizza last pm   The history is provided by the spouse. No language interpreter was used.    Past Medical History  Diagnosis Date  . Migraines   . Hypertension   . Diabetes mellitus   . Stroke     Past Surgical History  Procedure Date  . Wisdom tooth extraction     Family History  Problem Relation Age of Onset  . Multiple myeloma Mother   . Heart disease Father   . Hypertension Father     History  Substance Use Topics  . Smoking status: Never Smoker   . Smokeless tobacco: Never Used  . Alcohol Use: Yes     Comment: 1 mixed drink every 6 months    OB History    Grav Para Term Preterm Abortions TAB SAB Ect Mult Living                  Review of Systems  All other systems reviewed and are negative.    Allergies  Review of patient's allergies indicates no known allergies.  Home Medications   Current Outpatient Rx  Name  Route  Sig  Dispense  Refill  . CARVEDILOL 6.25 MG PO TABS   Oral   Take 1.5 tablets (9.375 mg total) by mouth 2 (two) times daily with a meal.   60 tablet   4   . CITALOPRAM HYDROBROMIDE 10 MG PO TABS   Oral   Take 10 mg by mouth daily.         Marland Kitchen CLONAZEPAM 0.5 MG PO TABS   Oral   Take 0.5 mg by mouth 2 (two) times daily as needed.         Marland Kitchen DIGOXIN 0.25 MG PO TABS   Oral   Take 0.125 mg by mouth daily.         . FUROSEMIDE 20 MG PO TABS   Oral   Take 1 tablet (20 mg total) by mouth daily as needed.   15 tablet   3   . GABAPENTIN 600 MG PO TABS   Oral   Take 600 mg by mouth 3 (three) times daily.         Marland Kitchen HYDROCODONE-ACETAMINOPHEN 5-325 MG PO TABS   Oral   Take 1 tablet by mouth every 6 (six) hours as needed.         . INSULIN DETEMIR 100 UNIT/ML [place] SOLN   Subcutaneous   Inject 10 Units into the skin at bedtime.          Marland Kitchen LISINOPRIL 10 MG PO TABS   Oral   Take 0.5 tablets (5 mg total) by mouth daily.         Marland Kitchen METFORMIN HCL 500 MG PO TABS   Oral   Take 1,000 mg  by mouth 2 (two) times daily with a meal.          . ONETOUCH ULTRA BLUE VI STRP               . PANTOPRAZOLE SODIUM 40 MG PO TBEC   Oral   Take 40 mg by mouth daily.         Marland Kitchen SPIRONOLACTONE 25 MG PO TABS   Oral   Take 1 tablet (25 mg total) by mouth daily.   30 tablet   0   . WARFARIN SODIUM 5 MG PO TABS   Oral   Take 1 tablet (5 mg total) by mouth daily.   2 tablet   0   . ZOLPIDEM TARTRATE 10 MG PO TABS   Oral   Take 5 mg by mouth at bedtime as needed. For sleep           BP 125/64  Pulse 77  Temp 97.9 F (36.6 C) (Oral)  Resp 18  SpO2 97%  Physical Exam  Nursing note and vitals reviewed. Constitutional: She is oriented to person, place, and time. She appears well-developed and well-nourished.  HENT:  Head: Normocephalic and atraumatic.  Eyes: Pupils are equal, round, and reactive to light.  Neck: Normal range of motion.  Cardiovascular: Normal rate.   Pulmonary/Chest: Effort normal.  Abdominal: Soft.  Musculoskeletal: Normal range of motion.  Neurological: She is alert and oriented to person, place, and time. She has normal reflexes.  Skin: Skin is warm.    ED Course  Procedures (including critical care time)  Labs Reviewed - No data to display No results found.   1. Coagulation disorder       MDM   Results for orders placed during the hospital encounter  of 09/22/12  PROTIME-INR      Component Value Range   Prothrombin Time 20.5 (*) 11.6 - 15.2 seconds   INR 1.83 (*) 0.00 - 1.49   No results found.  I spoke to Dr. Riley Nearing who reviewed labs,  INR and Pt reversed.    Pt advised no injection needed      Lonia Skinner [place], [place] 09/22/12 1558

## 2012-09-22 NOTE — Telephone Encounter (Signed)
Per Ulyess Blossom, PA have pt take 1 dose of lasix, called sister, Larita Fife, and left mess for her to call back

## 2012-09-22 NOTE — Telephone Encounter (Signed)
Spoke w/pt's sister Larita Fife, she is aware and will get lasix, rx sent into [facility], she states they are in the ER now for a vitamin K shot

## 2012-09-22 NOTE — ED Notes (Signed)
States her home health nurse sent her here for a PT/INR of 1.8, 18.2. She takes Coumadin.

## 2012-09-22 NOTE — ED Notes (Signed)
Pt d/c home with sister to drive

## 2012-09-22 NOTE — Telephone Encounter (Signed)
Home health nurse called with concerns about overnight weight gain Weight at home 1/20 = 153.2     1/21= 157.8  Denies sob or swelling Just weight gain  Ok to call sister with med changes @ [phone]

## 2012-09-23 NOTE — Assessment & Plan Note (Signed)
Controlled but as above will increase lisinopril 5 mg for afterload reduction.

## 2012-09-23 NOTE — Assessment & Plan Note (Signed)
Continue coumadin.  Recheck echo 1 month.

## 2012-09-23 NOTE — Assessment & Plan Note (Signed)
Volume status looks good.  Will titrate lisinopril to 5 mg daily.  Continue prn lasix, digoxin, carvedilol, and spiro.  Will recheck echo in 1 month.

## 2012-09-24 NOTE — ED Provider Notes (Signed)
Medical screening examination/treatment/procedure(s) were performed by non-physician practitioner and as supervising physician I was immediately available for consultation/collaboration.   Dameian Crisman, MD 09/24/12 0706 

## 2012-10-22 ENCOUNTER — Ambulatory Visit (HOSPITAL_COMMUNITY)
Admission: RE | Admit: 2012-10-22 | Discharge: 2012-10-22 | Disposition: A | Discharge status: Home / Self Care | Payer: BC Managed Care – PPO | Source: Ambulatory Visit | Attending: Internal Medicine | Admitting: Internal Medicine

## 2012-10-22 ENCOUNTER — Ambulatory Visit (HOSPITAL_BASED_OUTPATIENT_CLINIC_OR_DEPARTMENT_OTHER)
Admission: RE | Admit: 2012-10-22 | Discharge: 2012-10-22 | Disposition: A | Discharge status: Home / Self Care | Payer: BC Managed Care – PPO | Source: Ambulatory Visit | Attending: Internal Medicine | Admitting: Internal Medicine

## 2012-10-22 VITALS — BP 110/66 | HR 76 | Wt 149.5 lb

## 2012-10-22 DIAGNOSIS — I519 Heart disease, unspecified: Secondary | ICD-10-CM

## 2012-10-22 DIAGNOSIS — I5022 Chronic systolic (congestive) heart failure: Secondary | ICD-10-CM

## 2012-10-22 DIAGNOSIS — I509 Heart failure, unspecified: Secondary | ICD-10-CM | POA: Insufficient documentation

## 2012-10-22 DIAGNOSIS — R0989 Other specified symptoms and signs involving the circulatory and respiratory systems: Secondary | ICD-10-CM | POA: Insufficient documentation

## 2012-10-22 DIAGNOSIS — I10 Essential (primary) hypertension: Secondary | ICD-10-CM | POA: Insufficient documentation

## 2012-10-22 DIAGNOSIS — E119 Type 2 diabetes mellitus without complications: Secondary | ICD-10-CM | POA: Insufficient documentation

## 2012-10-22 DIAGNOSIS — Z8673 Personal history of transient ischemic attack (TIA), and cerebral infarction without residual deficits: Secondary | ICD-10-CM | POA: Insufficient documentation

## 2012-10-22 DIAGNOSIS — R0609 Other forms of dyspnea: Secondary | ICD-10-CM | POA: Insufficient documentation

## 2012-10-22 MED ORDER — CARVEDILOL 12.5 MG PO TABS: 12.5000 mg | ORAL_TABLET | Freq: Two times a day (BID) | ORAL | Status: DC

## 2012-10-22 MED ORDER — CARVEDILOL 6.25 MG PO TABS: 6.2500 mg | ORAL_TABLET | Freq: Two times a day (BID) | ORAL | Status: DC

## 2012-10-22 NOTE — Progress Notes (Signed)
*  PRELIMINARY RESULTS* Echocardiogram 2D Echocardiogram has been performed.  Margaret Hess 10/22/2012, 12:56 PM

## 2012-10-22 NOTE — Patient Instructions (Addendum)
Follow up in 1 month  Take Carvedilol 6.25 mg twice a day  Do the following things EVERYDAY: 1) Weigh yourself in the morning before breakfast. Write it down and keep it in a log. 2) Take your medicines as prescribed 3) Eat low salt foods-Limit salt (sodium) to 2000 mg per day.  4) Stay as active as you can everyday 5) Limit all fluids for the day to less than 2 liters

## 2012-10-22 NOTE — Assessment & Plan Note (Addendum)
NYHA II. Volume status stable. Dr Gala Romney discussed and reviewed ECHO results. EF improving. Increase carvedilol to 6.25 mg twice a day. Provided a list of personal care services companies for [facility]. Follow up in 1 month.   Patient seen and examined with Tonye Becket, NP. We discussed all aspects of the encounter. I agree with the assessment and plan as stated above. Clinically, HF is much improved, though she remains quite debilitated from her CVA. I reviewed echo personally and EF improving. Will not need ICD at this point. Titrate meds as above. We spent an extensive amount of time with her and her sister reviewing options for home care as their finances are a major issues. We discussed with SW here and also provided contact info for [facility] network.

## 2012-10-22 NOTE — Progress Notes (Signed)
Patient ID: Margaret Hess, female   DOB: 1966-04-19, 47 y.o.   MRN: [id number] PCP: Dr. Piedad Climes  Neurologist: Dr Pearlean Brownie  HPI: 47 yo woman with history of DM, HTN, NICM, EF <20%. LV thrombus, and Grade 3 diastolic dysfunction. Mod TR. Discharged from [facility] 06/19/12 after being treated for acute systolic heart failure and LV thrombus on coumadin.   Presented to ED 06/20/12 with difficulty speaking and L sided weakness. CT of head negative. 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 Discharged from acute care to [facility] rehab. Later discharged to [facility] for ongoing rehab 07/21/2012. Discharge weight 171 pounds.   07/07/12 ECHO EF 20-25% grade II diastolic dysfunction  10/21/12 ECHO EF 30-35% personally reviewed.   She returns for follow up with her sister. Denies SOB/PND/Orthopnea. Complains of L shoulder pain.  Lives at home with sister. Plan to move to [facility] with her sister. Weight at home 155 pounds. Appetite great. Disability approved. Ambulates with 4 prong cane. Compliant with medications. She requires assistance with ADLs. Home health no longer following. Follow up with Dr Pearlean Brownie, November 03, 2012.     ROS: All systems negative except as listed in HPI, PMH and Problem List.  Past Medical History  Diagnosis Date  . Migraines   . Hypertension   . Diabetes mellitus   . Stroke     Current Outpatient Prescriptions  Medication Sig Dispense Refill  . carvedilol (COREG) 6.25 MG tablet Take 1.5 tablets (9.375 mg total) by mouth 2 (two) times daily with a meal.  60 tablet  4  . citalopram (CELEXA) 10 MG tablet Take 10 mg by mouth daily.      . clonazePAM (KLONOPIN) 0.5 MG tablet Take 0.5 mg by mouth 2 (two) times daily as needed.      . digoxin (LANOXIN) 0.25 MG tablet Take 0.125 mg by mouth daily.      . furosemide (LASIX) 20 MG tablet Take 1 tablet  (20 mg total) by mouth daily as needed.  15 tablet  3  . gabapentin (NEURONTIN) 600 MG tablet Take 600 mg by mouth 3 (three) times daily.      Marland Kitchen HYDROcodone-acetaminophen (NORCO/VICODIN) 5-325 MG per tablet Take 1 tablet by mouth every 6 (six) hours as needed.      . insulin detemir (LEVEMIR) 100 UNIT/ML injection Inject 10 Units into the skin at bedtime.       Marland Kitchen lisinopril (PRINIVIL,ZESTRIL) 2.5 MG tablet Take 2.5 mg by mouth daily.      . metFORMIN (GLUCOPHAGE) 500 MG tablet Take 1,000 mg by mouth 2 (two) times daily with a meal.       . ONE TOUCH ULTRA TEST test strip       . pantoprazole (PROTONIX) 40 MG tablet Take 40 mg by mouth daily.      Marland Kitchen spironolactone (ALDACTONE) 25 MG tablet Take 1 tablet (25 mg total) by mouth daily.  30 tablet  0  . warfarin (COUMADIN) 5 MG tablet Take 1 tablet (5 mg total) by mouth daily.  2 tablet  0  . zolpidem (AMBIEN) 10 MG tablet Take 5 mg by mouth at bedtime as needed. For sleep       No current facility-administered medications for this encounter.     PHYSICAL EXAM: Filed Vitals:   10/22/12 1244  BP: 110/66  Pulse: 76  Weight: 149 lb 8  oz (67.813 kg)  SpO2: 100%    General:  Well appearing. No resp difficulty in wheelchair (friend present) HEENT: normal Neck: supple. JVP flat. Carotids 2+ bilaterally; no bruits. No lymphadenopathy or thryomegaly appreciated. Cor: PMI normal. Regular rate & rhythm. No rubs, gallops or murmurs. Lungs: clear Abdomen: soft, nontender, nondistended. No hepatosplenomegaly. No bruits or masses. Good bowel sounds. Extremities: no cyanosis, clubbing, rash, edema. RLE brace in place. LUE sling   Neuro: alert & orientedx3, cranial nerves grossly intact. Moves all 4 extremities w/o difficulty. Affect pleasant.      ASSESSMENT & PLAN:

## 2012-11-11 ENCOUNTER — Ambulatory Visit (HOSPITAL_COMMUNITY)
Admission: RE | Admit: 2012-11-11 | Discharge: 2012-11-11 | Disposition: A | Discharge status: Home / Self Care | Payer: BC Managed Care – PPO | Source: Ambulatory Visit | Attending: Internal Medicine | Admitting: Internal Medicine

## 2012-11-11 ENCOUNTER — Ambulatory Visit (INDEPENDENT_AMBULATORY_CARE_PROVIDER_SITE_OTHER): Payer: BC Managed Care – PPO | Admitting: *Deleted

## 2012-11-11 VITALS — BP 120/74 | HR 76 | Wt 145.5 lb

## 2012-11-11 DIAGNOSIS — I6340 Cerebral infarction due to embolism of unspecified cerebral artery: Secondary | ICD-10-CM

## 2012-11-11 DIAGNOSIS — I5022 Chronic systolic (congestive) heart failure: Secondary | ICD-10-CM

## 2012-11-11 DIAGNOSIS — Z7901 Long term (current) use of anticoagulants: Secondary | ICD-10-CM | POA: Insufficient documentation

## 2012-11-11 DIAGNOSIS — I5189 Other ill-defined heart diseases: Secondary | ICD-10-CM

## 2012-11-11 DIAGNOSIS — I639 Cerebral infarction, unspecified: Secondary | ICD-10-CM

## 2012-11-11 DIAGNOSIS — I509 Heart failure, unspecified: Secondary | ICD-10-CM | POA: Insufficient documentation

## 2012-11-11 DIAGNOSIS — I240 Acute coronary thrombosis not resulting in myocardial infarction: Secondary | ICD-10-CM

## 2012-11-11 HISTORY — DX: Long term (current) use of anticoagulants: Z79.01

## 2012-11-11 LAB — POCT INR: INR: 2

## 2012-11-11 MED ORDER — LISINOPRIL 2.5 MG PO TABS: 2.5000 mg | ORAL_TABLET | Freq: Two times a day (BID) | ORAL | Status: DC

## 2012-11-11 NOTE — Progress Notes (Signed)
PCP: Dr. Piedad Climes  Neurologist: Dr Pearlean Brownie  HPI: 47 yo woman with history of DM, HTN, NICM, EF <20%. LV thrombus, and Grade 3 diastolic dysfunction. Mod TR. Discharged from [facility] 06/19/12 after being treated for acute systolic heart failure and LV thrombus on coumadin.   Presented to ED 06/20/12 with difficulty speaking and L sided weakness. CT of head negative. 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 Discharged from acute care to [facility] rehab. Later discharged to [facility] for ongoing rehab 07/21/2012.   07/07/12 ECHO EF 20-25% grade II diastolic dysfunction  10/21/12 ECHO EF 35-40%  She returns for follow up with her sister. Denies SOB/PND/Orthopnea.  She says weight is stable at home.  She has lasix but does not take it.  She is unable to complete all ADLs and has plans to hopefully move in to a rehab center so her sister can go back home.  She denies dizziness or syncope.  She has just started [facility] again with [facility].    ROS: All systems negative except as listed in HPI, PMH and Problem List.  Past Medical History  Diagnosis Date  . Migraines   . Hypertension   . Diabetes mellitus   . Stroke     Current Outpatient Prescriptions  Medication Sig Dispense Refill  . carvedilol (COREG) 6.25 MG tablet Take 1 tablet (6.25 mg total) by mouth 2 (two) times daily with a meal.  60 tablet  6  . citalopram (CELEXA) 20 MG tablet Take 20 mg by mouth daily.      . digoxin (LANOXIN) 0.25 MG tablet Take 0.125 mg by mouth daily.      . furosemide (LASIX) 20 MG tablet Take 1 tablet (20 mg total) by mouth daily as needed.  15 tablet  3  . gabapentin (NEURONTIN) 600 MG tablet Take 600 mg by mouth 3 (three) times daily.      Marland Kitchen HYDROcodone-acetaminophen (NORCO/VICODIN) 5-325 MG per tablet Take 1 tablet by mouth every 6 (six) hours as needed.      . insulin detemir  (LEVEMIR) 100 UNIT/ML injection Inject 25 Units into the skin at bedtime.       Marland Kitchen lisinopril (PRINIVIL,ZESTRIL) 2.5 MG tablet Take 2.5 mg by mouth daily.      . metFORMIN (GLUCOPHAGE) 500 MG tablet Take 1,000 mg by mouth 2 (two) times daily with a meal.       . ONE TOUCH ULTRA TEST test strip       . pantoprazole (PROTONIX) 40 MG tablet Take 40 mg by mouth daily.      Marland Kitchen spironolactone (ALDACTONE) 25 MG tablet Take 1 tablet (25 mg total) by mouth daily.  30 tablet  0  . warfarin (COUMADIN) 2.5 MG tablet Take by mouth daily. Take as directed by coumadin clinic      . zolpidem (AMBIEN) 10 MG tablet Take 5 mg by mouth at bedtime as needed. For sleep       No current facility-administered medications for this encounter.     PHYSICAL EXAM: Filed Vitals:   11/11/12 1503  BP: 120/74  Pulse: 76  Weight: 145 lb 8 oz (65.998 kg)  SpO2: 100%    General:  Well appearing. No resp difficulty in wheelchair  HEENT: normal Neck: supple. JVP flat. Carotids 2+ bilaterally; no bruits. No lymphadenopathy or thryomegaly appreciated. Cor: PMI normal. Regular rate & rhythm.  No rubs, gallops or murmurs. Lungs: clear Abdomen: soft, nontender, nondistended. No hepatosplenomegaly. No bruits or masses. Good bowel sounds. Extremities: no cyanosis, clubbing, rash, edema. RLE brace in place. LUE sling   Neuro: alert & orientedx3, cranial nerves grossly intact. Moves all 4 extremities w/o difficulty. Affect pleasant.      ASSESSMENT & PLAN:

## 2012-11-11 NOTE — Patient Instructions (Signed)
A full discussion of the nature of anticoagulants has been carried out.  A benefit risk analysis has been presented to the patient, so that they understand the justification for choosing anticoagulation at this time. The need for frequent and regular monitoring, precise dosage adjustment and compliance is stressed.  Side effects of potential bleeding are discussed.  The patient should avoid any OTC items containing aspirin or ibuprofen, and should avoid great swings in general diet.  Avoid alcohol consumption.  Call if any signs of abnormal bleeding.  Next PT/INR test in one week.  

## 2012-11-11 NOTE — Patient Instructions (Addendum)
Increase lisinopril 2.5 mg twice daily.  Follow up 1 month.

## 2012-11-12 NOTE — Assessment & Plan Note (Signed)
EF slowly improving per last echo.  She currently does not demonstrate HF symptoms and is not needing to use lasix.  Will continue sliding scale lasix.  Will further titrate heart failure medication with lisinopril 2.5 mg BID.  Continue to follow regularly in the HF clinic.

## 2012-11-12 NOTE — Assessment & Plan Note (Signed)
No signs of bleeding, continue coumadin.

## 2012-11-18 ENCOUNTER — Ambulatory Visit (INDEPENDENT_AMBULATORY_CARE_PROVIDER_SITE_OTHER): Payer: BC Managed Care – PPO | Admitting: Cardiology

## 2012-11-18 DIAGNOSIS — I5189 Other ill-defined heart diseases: Secondary | ICD-10-CM

## 2012-11-18 DIAGNOSIS — I639 Cerebral infarction, unspecified: Secondary | ICD-10-CM

## 2012-11-18 DIAGNOSIS — Z7901 Long term (current) use of anticoagulants: Secondary | ICD-10-CM

## 2012-11-18 DIAGNOSIS — I6340 Cerebral infarction due to embolism of unspecified cerebral artery: Secondary | ICD-10-CM

## 2012-11-18 DIAGNOSIS — I240 Acute coronary thrombosis not resulting in myocardial infarction: Secondary | ICD-10-CM

## 2012-11-18 LAB — POCT INR: INR: 1.6

## 2012-11-20 ENCOUNTER — Encounter: Payer: Self-pay | Admitting: Physical Medicine & Rehabilitation

## 2012-11-26 ENCOUNTER — Encounter: Payer: Self-pay | Admitting: Physical Medicine & Rehabilitation

## 2012-11-26 ENCOUNTER — Ambulatory Visit (INDEPENDENT_AMBULATORY_CARE_PROVIDER_SITE_OTHER): Payer: BC Managed Care – PPO | Admitting: *Deleted

## 2012-11-26 ENCOUNTER — Ambulatory Visit (HOSPITAL_BASED_OUTPATIENT_CLINIC_OR_DEPARTMENT_OTHER): Payer: BC Managed Care – PPO | Admitting: Physical Medicine & Rehabilitation

## 2012-11-26 ENCOUNTER — Encounter: Payer: BC Managed Care – PPO | Attending: Physical Medicine & Rehabilitation

## 2012-11-26 VITALS — BP 117/62 | HR 70 | Resp 14 | Ht 65.0 in | Wt 140.0 lb

## 2012-11-26 DIAGNOSIS — I240 Acute coronary thrombosis not resulting in myocardial infarction: Secondary | ICD-10-CM

## 2012-11-26 DIAGNOSIS — I5189 Other ill-defined heart diseases: Secondary | ICD-10-CM

## 2012-11-26 DIAGNOSIS — I639 Cerebral infarction, unspecified: Secondary | ICD-10-CM

## 2012-11-26 DIAGNOSIS — G8110 Spastic hemiplegia affecting unspecified side: Secondary | ICD-10-CM

## 2012-11-26 DIAGNOSIS — Z7901 Long term (current) use of anticoagulants: Secondary | ICD-10-CM

## 2012-11-26 DIAGNOSIS — I6340 Cerebral infarction due to embolism of unspecified cerebral artery: Secondary | ICD-10-CM

## 2012-11-26 LAB — POCT INR: INR: 2.2

## 2012-11-26 NOTE — Patient Instructions (Signed)
OnabotulinumtoxinA injection (Medical Use) What is this medicine? ONABOTULINUMTOXINA is a neuro-muscular blocker. This medicine is used to treat crossed eyes, eyelid spasms, severe neck muscle spasms, and elbow, wrist, and finger muscle spasms. It is also used to treat excessive underarm sweating, to prevent chronic migraine headaches, and to treat loss of bladder control due to neurologic conditions such as multiple sclerosis or spinal cord injury. This medicine may be used for other purposes; ask your health care provider or pharmacist if you have questions. What should I tell my health care provider before I take this medicine? They need to know if you have any of these conditions: -breathing problems -cerebral palsy spasms -difficulty urinating -heart problems -history of surgery where this medicine is going to be used -infection at the site where this medicine is going to be used -myasthenia gravis or other neurologic disease -nerve or muscle disease -surgery plans -take medicines that treat or prevent blood clots -thyroid problems -an unusual or allergic reaction to botulinum toxin, albumin, other medicines, foods, dyes, or preservatives -pregnant or trying to get pregnant -breast-feeding How should I use this medicine? This medicine is for injection into a muscle. It is given by a health care professional in a hospital or clinic setting. Talk to your pediatrician regarding the use of this medicine in children. While this drug may be prescribed for children as young as 43 years old for selected conditions, precautions do apply. Overdosage: If you think you have taken too much of this medicine contact a poison control center or emergency room at once. NOTE: This medicine is only for you. Do not share this medicine with others. What if I miss a dose? This does not apply. What may interact with this medicine? -aminoglycoside antibiotics like gentamicin, neomycin, tobramycin -muscle  relaxants -other botulinum toxin injections This list may not describe all possible interactions. Give your health care provider a list of all the medicines, herbs, non-prescription drugs, or dietary supplements you use. Also tell them if you smoke, drink alcohol, or use illegal drugs. Some items may interact with your medicine. What should I watch for while using this medicine? Visit your doctor for regular check ups. This medicine will cause weakness in the muscle where it is injected. Tell your doctor if you feel unusually weak in other muscles. Get medical help right away if you have problems with breathing, swallowing, or talking. This medicine might make your eyelids droop or make you see blurry or double. If you have weak muscles or trouble seeing do not drive a car, use machinery, or do other dangerous activities. This medicine contains albumin from human blood. It may be possible to pass an infection in this medicine, but no cases have been reported. Talk to your doctor about the risks and benefits of this medicine. If your activities have been limited by your condition, go back to your regular routine slowly after treatment with this medicine. What side effects may I notice from receiving this medicine? Side effects that you should report to your doctor or health care professional as soon as possible: -allergic reactions like skin rash, itching or hives, swelling of the face, lips, or tongue -breathing problems -changes in vision -chest pain or tightness -eye irritation, pain -fast, irregular heartbeat -infection -numbness -speech problems -swallowing problems -unusual weakness Side effects that usually do not require medical attention (report to your doctor or health care professional if they continue or are bothersome): -bruising or pain at site where injected -drooping eyelid -dry eyes  or mouth -headache -muscles aches, pains -sensitivity to light -tearing This list may not  describe all possible side effects. Call your doctor for medical advice about side effects. You may report side effects to FDA at 1-800-FDA-1088. Where should I keep my medicine? This drug is given in a hospital or clinic and will not be stored at home. NOTE: This sheet is a summary. It may not cover all possible information. If you have questions about this medicine, talk to your doctor, pharmacist, or health care provider.  2013, [employer]vier/[employer]. (06/26/2010 8:06:56 AM)

## 2012-11-26 NOTE — Progress Notes (Signed)
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 Spasticity not responding to conservative care such as medication and therapy. Informed consent was obtained after describing risks and benefits of the procedure the patient patient has aphasia sister who is HCPOA helped with consent process. Patient placed in a seated position the areas marked and prepped with Betadine and alcohol.50 mm 26-gauge needle under EMG guidance 25 units injected into PL 50 units injection to left FDS 50 units injected into the left FCR 25 units injected into the left PT 50 units injected into the left FDP Patient tolerated procedure well. Post procedure instructions given.

## 2012-12-03 ENCOUNTER — Ambulatory Visit (INDEPENDENT_AMBULATORY_CARE_PROVIDER_SITE_OTHER): Payer: BC Managed Care – PPO | Admitting: *Deleted

## 2012-12-03 DIAGNOSIS — I6340 Cerebral infarction due to embolism of unspecified cerebral artery: Secondary | ICD-10-CM

## 2012-12-03 DIAGNOSIS — I5189 Other ill-defined heart diseases: Secondary | ICD-10-CM

## 2012-12-03 DIAGNOSIS — Z7901 Long term (current) use of anticoagulants: Secondary | ICD-10-CM

## 2012-12-03 DIAGNOSIS — I639 Cerebral infarction, unspecified: Secondary | ICD-10-CM

## 2012-12-03 DIAGNOSIS — I240 Acute coronary thrombosis not resulting in myocardial infarction: Secondary | ICD-10-CM

## 2012-12-07 ENCOUNTER — Other Ambulatory Visit: Payer: Self-pay | Admitting: Internal Medicine

## 2012-12-21 ENCOUNTER — Ambulatory Visit (HOSPITAL_BASED_OUTPATIENT_CLINIC_OR_DEPARTMENT_OTHER): Payer: BC Managed Care – PPO | Admitting: Physical Medicine & Rehabilitation

## 2012-12-21 ENCOUNTER — Ambulatory Visit (HOSPITAL_COMMUNITY)
Admission: RE | Admit: 2012-12-21 | Discharge: 2012-12-21 | Disposition: A | Discharge status: Home / Self Care | Payer: BC Managed Care – PPO | Source: Ambulatory Visit | Attending: Internal Medicine | Admitting: Internal Medicine

## 2012-12-21 ENCOUNTER — Encounter: Payer: Self-pay | Admitting: Physical Medicine & Rehabilitation

## 2012-12-21 ENCOUNTER — Encounter (HOSPITAL_COMMUNITY): Payer: Self-pay

## 2012-12-21 ENCOUNTER — Encounter (HOSPITAL_COMMUNITY): Payer: Self-pay | Admitting: Physician Assistant

## 2012-12-21 ENCOUNTER — Encounter: Payer: BC Managed Care – PPO | Attending: Physical Medicine & Rehabilitation

## 2012-12-21 ENCOUNTER — Encounter (HOSPITAL_COMMUNITY): Payer: Self-pay | Admitting: Cardiology

## 2012-12-21 VITALS — BP 112/64 | HR 75 | Wt 137.4 lb

## 2012-12-21 VITALS — BP 91/59 | HR 77 | Resp 14 | Wt 136.0 lb

## 2012-12-21 DIAGNOSIS — Z794 Long term (current) use of insulin: Secondary | ICD-10-CM | POA: Insufficient documentation

## 2012-12-21 DIAGNOSIS — I69920 Aphasia following unspecified cerebrovascular disease: Secondary | ICD-10-CM | POA: Insufficient documentation

## 2012-12-21 DIAGNOSIS — I240 Acute coronary thrombosis not resulting in myocardial infarction: Secondary | ICD-10-CM

## 2012-12-21 DIAGNOSIS — I5189 Other ill-defined heart diseases: Secondary | ICD-10-CM | POA: Insufficient documentation

## 2012-12-21 DIAGNOSIS — I5022 Chronic systolic (congestive) heart failure: Secondary | ICD-10-CM

## 2012-12-21 DIAGNOSIS — Z8673 Personal history of transient ischemic attack (TIA), and cerebral infarction without residual deficits: Secondary | ICD-10-CM | POA: Insufficient documentation

## 2012-12-21 DIAGNOSIS — M25519 Pain in unspecified shoulder: Secondary | ICD-10-CM | POA: Insufficient documentation

## 2012-12-21 DIAGNOSIS — E119 Type 2 diabetes mellitus without complications: Secondary | ICD-10-CM | POA: Insufficient documentation

## 2012-12-21 DIAGNOSIS — I6350 Cerebral infarction due to unspecified occlusion or stenosis of unspecified cerebral artery: Secondary | ICD-10-CM

## 2012-12-21 DIAGNOSIS — I639 Cerebral infarction, unspecified: Secondary | ICD-10-CM

## 2012-12-21 DIAGNOSIS — I10 Essential (primary) hypertension: Secondary | ICD-10-CM | POA: Insufficient documentation

## 2012-12-21 DIAGNOSIS — Z7901 Long term (current) use of anticoagulants: Secondary | ICD-10-CM | POA: Insufficient documentation

## 2012-12-21 DIAGNOSIS — M25512 Pain in left shoulder: Secondary | ICD-10-CM

## 2012-12-21 DIAGNOSIS — G43909 Migraine, unspecified, not intractable, without status migrainosus: Secondary | ICD-10-CM | POA: Insufficient documentation

## 2012-12-21 DIAGNOSIS — I428 Other cardiomyopathies: Secondary | ICD-10-CM

## 2012-12-21 DIAGNOSIS — I69959 Hemiplegia and hemiparesis following unspecified cerebrovascular disease affecting unspecified side: Secondary | ICD-10-CM | POA: Insufficient documentation

## 2012-12-21 DIAGNOSIS — Z79899 Other long term (current) drug therapy: Secondary | ICD-10-CM | POA: Insufficient documentation

## 2012-12-21 DIAGNOSIS — R937 Abnormal findings on diagnostic imaging of other parts of musculoskeletal system: Secondary | ICD-10-CM | POA: Insufficient documentation

## 2012-12-21 LAB — BASIC METABOLIC PANEL
BUN: 16 mg/dL (ref 6–23)
Chloride: 103 mEq/L (ref 96–112)
Creatinine, Ser: 0.61 mg/dL (ref 0.50–1.10)
GFR calc Af Amer: >90 mL/min (ref >90)
GFR calc non Af Amer: >90 mL/min (ref >90)
Glucose, Bld: 82 mg/dL (ref 70–99)

## 2012-12-21 MED ORDER — LISINOPRIL 5 MG PO TABS: 5.0000 mg | ORAL_TABLET | Freq: Two times a day (BID) | ORAL | Status: DC

## 2012-12-21 NOTE — Assessment & Plan Note (Signed)
EF 35-40% but patient exhibits no signs of HF.  Will continue to titrate HF meds with lisinopril 5 mg BID.  Will follow up in 1 month in hopes to titrate carvedilol.  Will repeat echo in 2 months.  Continue sliding scale lasix.

## 2012-12-21 NOTE — Patient Instructions (Signed)
You can make an appointment to see your eye doctor next month. Asked your eye doctor to do visual field testing to make sure you don't have blind spots on the left side  Your shoulder exam showed mild arthritis. No evidence of rotator cuff tear. 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 We discussed do not use the sling unless you are walking We discussed that a series of 3 injections may be beneficial  We will order outpatient therapy in one month when I see you back Continue speech therapy until that time

## 2012-12-21 NOTE — Assessment & Plan Note (Signed)
Continue speech therapy and will restart PT once speech finished.

## 2012-12-21 NOTE — Progress Notes (Signed)
PCP: Dr. Piedad Climes  Neurologist: Dr Pearlean Brownie  HPI: 47 yo woman with history of DM, HTN, NICM, EF <20%. LV thrombus, and Grade 3 diastolic dysfunction. Mod TR. Discharged from [facility] 06/19/12 after being treated for acute systolic heart failure and LV thrombus on coumadin.   Presented to ED 06/20/12 with difficulty speaking and L sided weakness. CT of head negative. 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 Discharged from acute care to [facility] rehab. Later discharged to [facility] for ongoing rehab 07/21/2012.   07/07/12 ECHO EF 20-25% grade II diastolic dysfunction  10/21/12 ECHO EF 35-40% grade 1 diastolic dysfunction  She returns for follow up with friend, caregiver.  Her sister Larita Fife left last week to go back to [place].  She has help almost everyday but lives at home alone.  Using wheelchair when she is out. Ambulates at home with brace and cane.  Speech therapy 2x/week.  PT on hold while getting speech therapy.  Not using lasix at all.  Weight stable.  Continues with HH with [facility].    ROS: All systems negative except as listed in HPI, PMH and Problem List.  Past Medical History  Diagnosis Date  . Migraines   . Hypertension   . Diabetes mellitus   . Stroke     Current Outpatient Prescriptions  Medication Sig Dispense Refill  . B-D ULTRAFINE III SHORT PEN 31G X 8 MM MISC USE AS DIRECTED  100 each  0  . carvedilol (COREG) 6.25 MG tablet Take 1 tablet (6.25 mg total) by mouth 2 (two) times daily with a meal.  60 tablet  6  . citalopram (CELEXA) 20 MG tablet Take 20 mg by mouth daily.      . digoxin (LANOXIN) 0.25 MG tablet Take 0.125 mg by mouth daily.      . furosemide (LASIX) 20 MG tablet Take 1 tablet (20 mg total) by mouth daily as needed.  15 tablet  3  . gabapentin (NEURONTIN) 600 MG tablet Take 600 mg by mouth 3 (three) times daily.      Marland Kitchen  HYDROcodone-acetaminophen (NORCO/VICODIN) 5-325 MG per tablet Take 1 tablet by mouth every 6 (six) hours as needed.      . insulin detemir (LEVEMIR) 100 UNIT/ML injection Inject 25 Units into the skin at bedtime.       Marland Kitchen lisinopril (PRINIVIL,ZESTRIL) 2.5 MG tablet Take 1 tablet (2.5 mg total) by mouth 2 (two) times daily.  60 tablet  6  . metFORMIN (GLUCOPHAGE) 500 MG tablet Take 1,000 mg by mouth 2 (two) times daily with a meal.       . ONE TOUCH ULTRA TEST test strip       . ONETOUCH DELICA LANCETS 33G MISC       . pantoprazole (PROTONIX) 40 MG tablet Take 40 mg by mouth daily.      Marland Kitchen spironolactone (ALDACTONE) 25 MG tablet Take 1 tablet (25 mg total) by mouth daily.  30 tablet  0  . warfarin (COUMADIN) 2.5 MG tablet Take by mouth daily. Take as directed by coumadin clinic      . zolpidem (AMBIEN) 10 MG tablet Take 5 mg by mouth at bedtime as needed. For sleep       No current facility-administered medications for this encounter.     PHYSICAL EXAM: Filed Vitals:   12/21/12 1412  BP: 112/64  Pulse:  75  Weight: 137 lb 6.4 oz (62.324 kg)  SpO2: 99%    General:  Well appearing. No resp difficulty in wheelchair  HEENT: normal Neck: supple. JVP flat. Carotids 2+ bilaterally; no bruits. No lymphadenopathy or thryomegaly appreciated. Cor: PMI normal. Regular rate & rhythm. No rubs, gallops or murmurs. Lungs: clear Abdomen: soft, nontender, nondistended. No hepatosplenomegaly. No bruits or masses. Good bowel sounds. Extremities: no cyanosis, clubbing, rash, edema. LLE brace in place. LUE sling   Neuro: alert & orientedx3, cranial nerves grossly intact. Moves all 4 extremities w/o difficulty. Affect pleasant.      ASSESSMENT & PLAN:

## 2012-12-21 NOTE — Assessment & Plan Note (Addendum)
Will continue coumadin until EF has recovered.  The patient has expressed interest in NOAC agents but there is no indication for NOAC agents in patients with LV thrombus.  Have discussed with the patient and she voices understanding.  Repeat echo in 2 months.

## 2012-12-21 NOTE — Progress Notes (Signed)
Left shoulder ultrasound Indication: Post stroke shoulder pain left 12 Hz linear transducer  Patient in seated position Left biceps tendon,Reduced tendon thickness LAX2 mm  Left supraspinatus tendon 3.2 mm LA X. View Reduced tendon thickness,No evidence of tendon tears   Left infraspinatus tendon SA exit and LAX views showed no evidence of tear. Mild irregularity of the humeral articular surface  Subscapularis tendon no evidence of tears or calcifications, increased tendon thickness  A.c. Joint showed no evidence of capsular disruption no evidence of joint surface irregularity.  Impression:  1. Abnormal study  2. Evidence of articular surface irregularity humeral head 3. Decreased tendon thickness, minimal fluid around the biceps tendon 4. No evidence of rotator cuff tear. There is reduced tendon thickness supraspinatus but increased tendon thickness subscapularis.

## 2012-12-21 NOTE — Patient Instructions (Addendum)
Increase lisinopril 5 mg (2 tabs) twice daily until you pick up new prescription and then it will be 1 tab twice daily.    Follow up 1 month with Dr. Gala Romney.  Labs today.

## 2012-12-28 ENCOUNTER — Telehealth: Payer: Self-pay

## 2012-12-28 NOTE — Telephone Encounter (Signed)
Pt's caregiver states pt is a 6 on pain scale, x5 days, nothing alleviates. Requesting earlier appt. Advsd to seek urgent, emergency, or PCP care if sx(s) became worse, will forward message to Dr. Marlis Edelson medical asst., to be placed on cancellation list. Caregiver agreed.

## 2012-12-28 NOTE — Telephone Encounter (Signed)
Message copied by Doree Barthel on Mon Dec 28, 2012 11:04 AM ------      Message from: Wallace Going B      Created: Mon Dec 28, 2012 10:20 AM      Contact: RANDI KEMMLER-HER MEDICAL COORDINATOR       PT HAS APPT WITH DR. Pearlean Brownie 02-12-13 BUT NEEDS TO BE SEEN SOONER-HAVING PAIN-PLEASE CALL-RANDI KEMMLER-[phone]. ------

## 2013-01-02 ENCOUNTER — Other Ambulatory Visit: Payer: Self-pay | Admitting: Internal Medicine

## 2013-01-12 ENCOUNTER — Encounter: Payer: Self-pay | Admitting: Physical Medicine & Rehabilitation

## 2013-01-12 ENCOUNTER — Ambulatory Visit (HOSPITAL_BASED_OUTPATIENT_CLINIC_OR_DEPARTMENT_OTHER): Payer: BC Managed Care – PPO | Admitting: Physical Medicine & Rehabilitation

## 2013-01-12 ENCOUNTER — Encounter: Payer: BC Managed Care – PPO | Attending: Physical Medicine & Rehabilitation

## 2013-01-12 VITALS — BP 88/48 | HR 79 | Resp 14 | Ht 65.0 in | Wt 134.0 lb

## 2013-01-12 DIAGNOSIS — G8110 Spastic hemiplegia affecting unspecified side: Secondary | ICD-10-CM

## 2013-01-12 DIAGNOSIS — I69320 Aphasia following cerebral infarction: Secondary | ICD-10-CM

## 2013-01-12 DIAGNOSIS — I69920 Aphasia following unspecified cerebrovascular disease: Secondary | ICD-10-CM

## 2013-01-12 NOTE — Progress Notes (Signed)
Subjective:    Patient ID: Margaret Hess, female    DOB: [birth year], 47 y.o.   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 Spasticity not responding to conservative care such as medication and therapy. Informed consent was obtained after describing risks and benefits of the procedure the patient patient has aphasia sister who is HCPOA helped with consent process. Patient placed in a seated position the areas marked and prepped with Betadine and alcohol.50 mm 26-gauge needle under EMG guidance 25 units injected into PL 50 units injection to left FDS 50 units injected into the left FCR 25 units injected into the left PT 50 units injected into the left FDP Patient tolerated procedure well. Post procedure instructions given.  Shoulder ultrasound in April 2014 demonstrated humeral head irregularity consistent with osteoarthritis. No evidence of rotator cuff tear. Thinning of the biceps tendon but otherwise no abnormalities.  Pain over entire left leg. No trauma. Has had no similar symptoms in the left arm. The left leg sometimes feels like its tingling and waking but this is also painful. Pain Inventory Average Pain 6 Pain Right Now 6 My pain is burning, dull and tingling  In the last 24 hours, has pain interfered with the following? General activity 7 Relation with others 3 Enjoyment of life 6 What TIME of day is your pain at its worst? varies Sleep (in general) Good  Pain is worse with: some activites Pain improves with: pacing activities and medication Relief from Meds: 3  Mobility walk with assistance use a cane how many minutes can you walk? 5 ability to climb steps?  yes do you drive?  no needs help with transfers transfers alone  Function retired  Neuro/Psych No problems in this area  Prior Studies Any changes since last visit?   yes  Physicians involved in your care Any changes since last visit?  no   Family History  Problem Relation Age of Onset  . Multiple myeloma Mother   . Heart disease Father   . Hypertension Father    History   Social History  . Marital Status: Single    Spouse Name: N/A    Number of Children: N/A  . Years of Education: N/A   Social History Main Topics  . Smoking status: Never Smoker   . Smokeless tobacco: Never Used  . Alcohol Use: Yes     Comment: 1 mixed drink every 6 months  . Drug Use: No  . Sexually Active: Yes    Birth Control/ Protection: Inserts   Other Topics Concern  . None   Social History Narrative  . None   Past Surgical History  Procedure Laterality Date  . Wisdom tooth extraction     Past Medical History  Diagnosis Date  . Migraines   . Hypertension   . Diabetes mellitus   . Stroke    BP 88/48  Pulse 79  Resp 14  Ht 5\' 5"  (1.651 m)  Wt 134 lb (60.782 kg)  BMI 22.3 kg/m2  SpO2 98%     Review of Systems  All other systems reviewed and are negative.       Objective:   Physical Exam  No active movement left upper extremity. Her tone is Ashworth grade 0 at the finger and wrist flexors. Limited range of motion left shoulder with the extra rotation. Left lower extremity has 05 ankle strength but 2 minus  to 3 minus knee extensor hip extensor synergy strength.      Assessment & Plan:  1. 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 Will recheck in 6 weeks to assess for reinjection. 2. Left shoulder pain likely multifactorial he does have evidence of arthritis. She also has limited range of motion consistent with adhesive capsulitis. May benefit from injections however she really does not want to pursue this at this time. We'll continue home health speech therapy., After she completes this will switch to outpatient PT OT  and speech 3. Painful dysesthesias left lower extremity likely related to the parietal location of CVA. Will increase gabapentin from 300 3 times a day to 600 3 times a day. If she becomes overly sedated on this higher dose. Would recommend 300 4 times a day instead

## 2013-01-12 NOTE — Patient Instructions (Addendum)
Increase gabapentin to 300 mg 2 tablets 3 times per day. If you become drowsy on this dose reduce it to one tablet 4 times a day If this is not helpful the next time I see you we may order some back x-rays.  Please talk to your heart doctor about your low blood pressure. They may need to adjust your blood pressure medications.

## 2013-01-13 ENCOUNTER — Other Ambulatory Visit: Payer: Self-pay | Admitting: Geriatric Medicine

## 2013-01-13 MED ORDER — DIGOXIN 250 MCG PO TABS: 0.2500 mg | ORAL_TABLET | Freq: Every day | ORAL | Status: DC

## 2013-01-18 ENCOUNTER — Ambulatory Visit (HOSPITAL_COMMUNITY): Admission: RE | Admit: 2013-01-18 | Payer: BC Managed Care – PPO | Source: Ambulatory Visit

## 2013-02-01 ENCOUNTER — Encounter (HOSPITAL_COMMUNITY): Payer: BC Managed Care – PPO

## 2013-02-04 ENCOUNTER — Telehealth: Payer: Self-pay | Admitting: *Deleted

## 2013-02-04 DIAGNOSIS — I6320 Cerebral infarction due to unspecified occlusion or stenosis of unspecified precerebral arteries: Secondary | ICD-10-CM

## 2013-02-04 DIAGNOSIS — I5189 Other ill-defined heart diseases: Secondary | ICD-10-CM

## 2013-02-04 DIAGNOSIS — I5022 Chronic systolic (congestive) heart failure: Secondary | ICD-10-CM

## 2013-02-04 DIAGNOSIS — G8110 Spastic hemiplegia affecting unspecified side: Secondary | ICD-10-CM

## 2013-02-04 NOTE — Telephone Encounter (Signed)
Needs order for op PT/OT and it needs to be in [place]. PT requesting referrals notified they have been placed.

## 2013-02-09 ENCOUNTER — Encounter (HOSPITAL_COMMUNITY): Payer: Self-pay

## 2013-02-09 ENCOUNTER — Ambulatory Visit (HOSPITAL_COMMUNITY)
Admission: RE | Admit: 2013-02-09 | Discharge: 2013-02-09 | Disposition: A | Discharge status: Home / Self Care | Payer: BC Managed Care – PPO | Source: Ambulatory Visit | Attending: Internal Medicine | Admitting: Internal Medicine

## 2013-02-09 VITALS — BP 90/62 | HR 86 | Wt 129.8 lb

## 2013-02-09 DIAGNOSIS — I5022 Chronic systolic (congestive) heart failure: Secondary | ICD-10-CM

## 2013-02-09 MED ORDER — LISINOPRIL 5 MG PO TABS: 5.0000 mg | ORAL_TABLET | Freq: Every day | ORAL | Status: DC

## 2013-02-09 NOTE — Assessment & Plan Note (Addendum)
NYHA II. Volume status stable. Continue lasix as needed. Stop digoxin. Cut lisinopril back to 5 mg at bed time. Follow up in 3 months with an ECHO.   Patient seen and examined with Tonye Becket, NP. We discussed all aspects of the encounter. I agree with the assessment and plan as stated above. She is much improved.No overt HF. Still struggling with hypotension though. Will cut lisinopril back. Stop digoxin. Reinforced need for daily weights and reviewed use of sliding scale diuretics. Return in 3 months with echo. Suspect EF will recover completely.

## 2013-02-09 NOTE — Progress Notes (Signed)
Patient ID: Margaret Hess, female   DOB: 08-02-1966, 47 y.o.   MRN: [id number] PCP: Dr. Piedad Climes  Neurologist: Dr Pearlean Brownie Pain: Dr Wynn Banker  HPI: 47 yo woman with history of DM, HTN, NICM, EF <20%. LV thrombus, and Grade 3 diastolic dysfunction. Mod TR. Discharged from [facility] 06/19/12 after being treated for acute systolic heart failure and LV thrombus on coumadin.   Presented to ED 06/20/12 with difficulty speaking and L sided weakness. CT of head negative. 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 Discharged from acute care to [facility] rehab. Later discharged to [facility] for ongoing rehab 07/21/2012.   07/07/12 ECHO EF 20-25% grade II diastolic dysfunction  10/21/12 ECHO EF 35-40% grade 1 diastolic dysfunction  She returns for follow up with friend, caregiver.  Last visit with Dr Wynn Banker 01/12/13 and he increased neurontin to 600 mg tid. Complains of fatigue performing ADLs. Denies SOB/PND/Orthopnea. Intermittent dizziness. Lives at home alone but has care giver 4 days a week for 4 hours at a time. Weight at home 125-130 pounds. She does not require lasix.  Ambulates at home with brace and cane.  She will start outpatient ST/OT/PT next week. Weight stable.  HH completed.     ROS: All systems negative except as listed in HPI, PMH and Problem List.  Past Medical History  Diagnosis Date  . Migraines   . Hypertension   . Diabetes mellitus   . Stroke     Current Outpatient Prescriptions  Medication Sig Dispense Refill  . acetaminophen (TYLENOL) 500 MG tablet Take 500 mg by mouth every 6 (six) hours as needed for pain.      . B-D ULTRAFINE III SHORT PEN 31G X 8 MM MISC USE AS DIRECTED  100 each  0  . carvedilol (COREG) 6.25 MG tablet Take 1 tablet (6.25 mg total) by mouth 2 (two) times daily with a meal.  60 tablet  6  . chlorzoxazone (PARAFON) 500 MG  tablet Take 500 mg by mouth 3 (three) times daily as needed for muscle spasms.      . citalopram (CELEXA) 20 MG tablet Take 20 mg by mouth daily.      . digoxin (DIGOX) 0.25 MG tablet Take 1 tablet (0.25 mg total) by mouth daily. Every morning.  30 tablet  3  . DULoxetine (CYMBALTA) 30 MG capsule Take 30 mg by mouth daily.      . furosemide (LASIX) 20 MG tablet Take 1 tablet (20 mg total) by mouth daily as needed.  15 tablet  3  . gabapentin (NEURONTIN) 600 MG tablet Take 600 mg by mouth 3 (three) times daily.      Marland Kitchen HYDROcodone-acetaminophen (NORCO/VICODIN) 5-325 MG per tablet Take 1 tablet by mouth every 6 (six) hours as needed.      . insulin detemir (LEVEMIR) 100 UNIT/ML injection Inject 25 Units into the skin at bedtime.       Marland Kitchen lisinopril (PRINIVIL,ZESTRIL) 5 MG tablet Take 1 tablet (5 mg total) by mouth 2 (two) times daily.  60 tablet  6  . metFORMIN (GLUCOPHAGE) 500 MG tablet Take 1,000 mg by mouth 2 (two) times daily with a meal.       . ONE TOUCH ULTRA TEST test strip       . ONETOUCH DELICA LANCETS 33G MISC       . spironolactone (ALDACTONE) 25 MG tablet Take  1 tablet (25 mg total) by mouth daily.  30 tablet  0  . warfarin (COUMADIN) 2.5 MG tablet Take by mouth daily. Take as directed by coumadin clinic      . zolpidem (AMBIEN) 10 MG tablet Take 5 mg by mouth at bedtime as needed. For sleep      . pantoprazole (PROTONIX) 40 MG tablet Take 40 mg by mouth daily.       No current facility-administered medications for this encounter.     PHYSICAL EXAM: Filed Vitals:   02/09/13 1200  BP: 90/62  Pulse: 86  Weight: 129 lb 12.8 oz (58.877 kg)  SpO2: 98%    General:  Well appearing. No resp difficulty. Ambulated with cane.  HEENT: normal Neck: supple. JVP flat. Carotids 2+ bilaterally; no bruits. No lymphadenopathy or thryomegaly appreciated. Cor: PMI normal. Regular rate & rhythm. No rubs, gallops or murmurs. Lungs: clear Abdomen: soft, nontender, nondistended. No  hepatosplenomegaly. No bruits or masses. Good bowel sounds. Extremities: no cyanosis, clubbing, rash, edema. LLE brace in place.    Neuro: alert & orientedx3, cranial nerves grossly intact. Moves all 4 extremities w/o difficulty. Affect pleasant.      ASSESSMENT & PLAN:

## 2013-02-09 NOTE — Patient Instructions (Addendum)
Follow up in 2-3  months with an ECHO  Stop digoxin  Take lisinopril 5 mg at bed time  Do the following things EVERYDAY: 1) Weigh yourself in the morning before breakfast. Write it down and keep it in a log. 2) Take your medicines as prescribed 3) Eat low salt foods-Limit salt (sodium) to 2000 mg per day.  4) Stay as active as you can everyday 5) Limit all fluids for the day to less than 2 liters

## 2013-02-12 ENCOUNTER — Ambulatory Visit: Payer: Self-pay | Admitting: Nurse Practitioner

## 2013-02-23 ENCOUNTER — Encounter: Payer: BC Managed Care – PPO | Attending: Physical Medicine & Rehabilitation

## 2013-02-23 ENCOUNTER — Ambulatory Visit (HOSPITAL_BASED_OUTPATIENT_CLINIC_OR_DEPARTMENT_OTHER): Payer: BC Managed Care – PPO | Admitting: Physical Medicine & Rehabilitation

## 2013-02-23 ENCOUNTER — Encounter: Payer: Self-pay | Admitting: Physical Medicine & Rehabilitation

## 2013-02-23 VITALS — BP 97/62 | HR 82 | Resp 16 | Wt 128.0 lb

## 2013-02-23 DIAGNOSIS — G890 Central pain syndrome: Secondary | ICD-10-CM

## 2013-02-23 DIAGNOSIS — M19012 Primary osteoarthritis, left shoulder: Secondary | ICD-10-CM

## 2013-02-23 DIAGNOSIS — M7502 Adhesive capsulitis of left shoulder: Secondary | ICD-10-CM

## 2013-02-23 DIAGNOSIS — M19019 Primary osteoarthritis, unspecified shoulder: Secondary | ICD-10-CM

## 2013-02-23 DIAGNOSIS — G8110 Spastic hemiplegia affecting unspecified side: Secondary | ICD-10-CM | POA: Insufficient documentation

## 2013-02-23 DIAGNOSIS — M7500 Adhesive capsulitis of unspecified shoulder: Secondary | ICD-10-CM

## 2013-02-23 NOTE — Patient Instructions (Addendum)
Corticosteroid injection left glenohumeral joint Depo-Medrol 40 mg, lidocaine 1%  Botox injection left forearm next month

## 2013-02-23 NOTE — Progress Notes (Signed)
Subjective:    Patient ID: Margaret Hess, female    DOB: 1965-09-28, 47 y.o.   MRN: [id number]  HPI Remains a phasic but speaks at phrase level Complaints of shoulder pain on the left Also complains of left entire arm and left entire lower limb deep pain Partial relief with gabapentin. Has seen a pain specialist in [facility] but cannot remember the name Pain Inventory Average Pain 8 Pain Right Now 7 My pain is constant, stabbing and aching  In the last 24 hours, has pain interfered with the following? General activity 4 Relation with others 3 Enjoyment of life 7 What TIME of day is your pain at its worst? morning,day Sleep (in general) Good  Pain is worse with: sitting and inactivity Pain improves with: heat/ice and medication Relief from Meds: 6  Mobility use a cane how many minutes can you walk? yes ability to climb steps?  yes do you drive?  yes Do you have any goals in this area?  no  Function retired I need assistance with the following:  meal prep, household duties and shopping  Neuro/Psych trouble walking dizziness confusion depression  Prior Studies Any changes since last visit?  no  Physicians involved in your care Any changes since last visit?  no   Family History  Problem Relation Age of Onset  . Multiple myeloma Mother   . Heart disease Father   . Hypertension Father    History   Social History  . Marital Status: Single    Spouse Name: N/A    Number of Children: N/A  . Years of Education: N/A   Social History Main Topics  . Smoking status: Never Smoker   . Smokeless tobacco: Never Used  . Alcohol Use: Yes     Comment: 1 mixed drink every 6 months  . Drug Use: No  . Sexually Active: Yes    Birth Control/ Protection: Inserts   Other Topics Concern  . None   Social History Narrative  . None   Past Surgical History  Procedure Laterality Date  . Wisdom tooth extraction     Past Medical History  Diagnosis Date  . Migraines    . Hypertension   . Diabetes mellitus   . Stroke    BP 97/62  Pulse 82  Resp 16  Wt 128 lb (58.06 kg)  BMI 21.3 kg/m2  SpO2 98%      Review of Systems  Constitutional: Positive for appetite change and unexpected weight change.  Musculoskeletal: Positive for gait problem.  Neurological: Positive for dizziness.  Psychiatric/Behavioral: Positive for confusion and dysphoric mood.  All other systems reviewed and are negative.       Objective:   Physical Exam  Pain with left shoulder range of motion particularly abduction as well as external rotation. Motor strength is 0/5 in the left deltoid, biceps, triceps, grip Hip and knee extensor synergy 3/5 Ankle dorsiflexor strength is 0/5 AFO is now poorly fitting with obvious loss of Circumference at the proximal leg Ambulates with a quad cane. Also uses an AFO. Short step length. Swing through gait. Right upper extremity normal Right lower extremity normal Left upper extremity has no hypersensitivity to touch or erythema or swelling Left lower extremity has no hypersensitivity to touch or erythema or swelling    Assessment & Plan:  1.  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  Informed consent was obtained after describing risks and benefits of the procedure with the patient, this includes bleeding, bruising, infection and medication side effects. The patient wishes to proceed and has given written consent. Patient was placed in a seated position. The left shoulder was marked and prepped with betadine in the subacromial area. A 25-gauge 1-1/2 inch needle was inserted into the subacromial area. After negative draw back for blood, a solution containing 1 mL of 40 mg per ML  depomedrol and 3 mL of 1% lidocaine was injected. A band aid was applied. The patient tolerated the procedure well. Post procedure instructions were given.  2. 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 Botox is starting to wear off. Last injection approximately 3 months ago. We'll schedule for next month  3. 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 She is responding to gabapentin but not as much as she would like. I upper to increase the dose however she states she is seeing some type of pain Dr. in [facility] and that she may try some other medicines. We did discuss this could be a long-term issue

## 2013-03-25 ENCOUNTER — Other Ambulatory Visit: Payer: Self-pay | Admitting: Internal Medicine

## 2013-03-30 ENCOUNTER — Telehealth (HOSPITAL_COMMUNITY): Payer: Self-pay | Admitting: *Deleted

## 2013-03-30 NOTE — Telephone Encounter (Signed)
BP running low for while.  70/50 BP Per Dr Gala Romney - pt to stop Lisinopril  appt scheduled for 8/4 to follow up.  They will notify the patient

## 2013-04-05 ENCOUNTER — Emergency Department (HOSPITAL_COMMUNITY): Payer: BC Managed Care – PPO

## 2013-04-05 ENCOUNTER — Encounter (HOSPITAL_COMMUNITY): Payer: Self-pay

## 2013-04-05 ENCOUNTER — Ambulatory Visit (HOSPITAL_BASED_OUTPATIENT_CLINIC_OR_DEPARTMENT_OTHER)
Admission: RE | Admit: 2013-04-05 | Discharge: 2013-04-05 | Disposition: A | Discharge status: Home / Self Care | Payer: BC Managed Care – PPO | Source: Ambulatory Visit | Attending: Internal Medicine | Admitting: Internal Medicine

## 2013-04-05 ENCOUNTER — Observation Stay (HOSPITAL_COMMUNITY)
Admission: EM | Admit: 2013-04-05 | Discharge: 2013-04-06 | Disposition: A | Discharge status: Home / Self Care | Payer: BC Managed Care – PPO | Attending: Internal Medicine | Admitting: Internal Medicine

## 2013-04-05 VITALS — BP 68/1 | HR 106 | Wt 122.8 lb

## 2013-04-05 DIAGNOSIS — I5041 Acute combined systolic (congestive) and diastolic (congestive) heart failure: Secondary | ICD-10-CM

## 2013-04-05 DIAGNOSIS — R791 Abnormal coagulation profile: Secondary | ICD-10-CM

## 2013-04-05 DIAGNOSIS — I5189 Other ill-defined heart diseases: Secondary | ICD-10-CM | POA: Insufficient documentation

## 2013-04-05 DIAGNOSIS — I2789 Other specified pulmonary heart diseases: Secondary | ICD-10-CM | POA: Insufficient documentation

## 2013-04-05 DIAGNOSIS — I10 Essential (primary) hypertension: Secondary | ICD-10-CM | POA: Diagnosis present

## 2013-04-05 DIAGNOSIS — I509 Heart failure, unspecified: Secondary | ICD-10-CM | POA: Insufficient documentation

## 2013-04-05 DIAGNOSIS — I959 Hypotension, unspecified: Principal | ICD-10-CM | POA: Insufficient documentation

## 2013-04-05 DIAGNOSIS — I639 Cerebral infarction, unspecified: Secondary | ICD-10-CM

## 2013-04-05 DIAGNOSIS — I5022 Chronic systolic (congestive) heart failure: Secondary | ICD-10-CM

## 2013-04-05 DIAGNOSIS — Z7901 Long term (current) use of anticoagulants: Secondary | ICD-10-CM | POA: Insufficient documentation

## 2013-04-05 DIAGNOSIS — I272 Pulmonary hypertension, unspecified: Secondary | ICD-10-CM | POA: Diagnosis present

## 2013-04-05 DIAGNOSIS — I6350 Cerebral infarction due to unspecified occlusion or stenosis of unspecified cerebral artery: Secondary | ICD-10-CM

## 2013-04-05 DIAGNOSIS — E869 Volume depletion, unspecified: Secondary | ICD-10-CM

## 2013-04-05 DIAGNOSIS — E119 Type 2 diabetes mellitus without complications: Secondary | ICD-10-CM | POA: Diagnosis present

## 2013-04-05 DIAGNOSIS — E1165 Type 2 diabetes mellitus with hyperglycemia: Secondary | ICD-10-CM | POA: Diagnosis present

## 2013-04-05 DIAGNOSIS — Z8673 Personal history of transient ischemic attack (TIA), and cerebral infarction without residual deficits: Secondary | ICD-10-CM | POA: Insufficient documentation

## 2013-04-05 DIAGNOSIS — Z79899 Other long term (current) drug therapy: Secondary | ICD-10-CM | POA: Insufficient documentation

## 2013-04-05 HISTORY — DX: Heart failure, unspecified: I50.9

## 2013-04-05 LAB — URINE MICROSCOPIC-ADD ON

## 2013-04-05 LAB — CBC
HCT: 36.8 % (ref 36.0–46.0)
MCHC: 34.8 g/dL (ref 30.0–36.0)
MCV: 87.8 fL (ref 78.0–100.0)
RDW: 13.1 % (ref 11.5–15.5)
WBC: 9.3 10*3/uL (ref 4.0–10.5)

## 2013-04-05 LAB — BASIC METABOLIC PANEL
BUN: 26 mg/dL — ABNORMAL HIGH (ref 6–23)
Chloride: 96 mEq/L (ref 96–112)
Creatinine, Ser: 0.82 mg/dL (ref 0.50–1.10)
GFR calc Af Amer: >90 mL/min (ref >90)

## 2013-04-05 LAB — URINALYSIS, ROUTINE W REFLEX MICROSCOPIC
Bilirubin Urine: NEGATIVE
Ketones, ur: NEGATIVE mg/dL
Nitrite: NEGATIVE
Specific Gravity, Urine: 1.008 (ref 1.005–1.030)
Urobilinogen, UA: 0.2 mg/dL (ref 0.0–1.0)

## 2013-04-05 LAB — LACTIC ACID, PLASMA: Lactic Acid, Venous: 4.5 mmol/L — ABNORMAL HIGH (ref 0.5–2.2)

## 2013-04-05 MED ORDER — SODIUM CHLORIDE 0.9 % IV BOLUS (SEPSIS)
500.0000 mL | Freq: Once | INTRAVENOUS | Status: AC
  Administered 2013-04-05: 500 mL via INTRAVENOUS

## 2013-04-05 MED ORDER — ACETAMINOPHEN 325 MG PO TABS
650.0000 mg | ORAL_TABLET | Freq: Once | ORAL | Status: AC
  Administered 2013-04-05: 650 mg via ORAL
  Filled 2013-04-05: qty 2

## 2013-04-05 MED ORDER — SODIUM CHLORIDE 0.9 % IV SOLN: 250.0000 mL | INTRAVENOUS | Status: DC | PRN

## 2013-04-05 MED ORDER — GABAPENTIN 400 MG PO CAPS
800.0000 mg | ORAL_CAPSULE | Freq: Two times a day (BID) | ORAL | Status: DC
  Administered 2013-04-06 (×2): 800 mg via ORAL
  Filled 2013-04-05 (×3): qty 2

## 2013-04-05 MED ORDER — SODIUM CHLORIDE 0.9 % IJ SOLN
3.0000 mL | Freq: Two times a day (BID) | INTRAMUSCULAR | Status: DC
  Administered 2013-04-05 – 2013-04-06 (×2): 3 mL via INTRAVENOUS

## 2013-04-05 MED ORDER — SODIUM CHLORIDE 0.9 % IV SOLN
1000.0000 mL | Freq: Once | INTRAVENOUS | Status: AC
  Administered 2013-04-05: 1000 mL via INTRAVENOUS

## 2013-04-05 MED ORDER — GABAPENTIN 800 MG PO TABS: 800.0000 mg | ORAL_TABLET | Freq: Three times a day (TID) | ORAL | Status: DC

## 2013-04-05 MED ORDER — SODIUM CHLORIDE 0.9 % IJ SOLN: 3.0000 mL | INTRAMUSCULAR | Status: DC | PRN

## 2013-04-05 MED ORDER — PANTOPRAZOLE SODIUM 40 MG PO TBEC
40.0000 mg | DELAYED_RELEASE_TABLET | Freq: Every morning | ORAL | Status: DC
  Administered 2013-04-06: 40 mg via ORAL
  Filled 2013-04-05: qty 1

## 2013-04-05 MED ORDER — GABAPENTIN 400 MG PO CAPS
1600.0000 mg | ORAL_CAPSULE | Freq: Every day | ORAL | Status: DC
  Administered 2013-04-05: 1600 mg via ORAL
  Filled 2013-04-05 (×2): qty 4

## 2013-04-05 MED ORDER — ACETAMINOPHEN 500 MG PO TABS
500.0000 mg | ORAL_TABLET | Freq: Four times a day (QID) | ORAL | Status: DC | PRN
  Filled 2013-04-05: qty 1

## 2013-04-05 MED ORDER — DULOXETINE HCL 60 MG PO CPEP
60.0000 mg | ORAL_CAPSULE | Freq: Every day | ORAL | Status: DC
  Administered 2013-04-06: 60 mg via ORAL
  Filled 2013-04-05: qty 1

## 2013-04-05 MED ORDER — HYDROCODONE-ACETAMINOPHEN 5-325 MG PO TABS: 1.0000 | ORAL_TABLET | Freq: Two times a day (BID) | ORAL | Status: DC | PRN

## 2013-04-05 MED ORDER — SODIUM CHLORIDE 0.9 % IV SOLN
1000.0000 mL | INTRAVENOUS | Status: DC
  Administered 2013-04-05: 1000 mL via INTRAVENOUS

## 2013-04-05 MED ORDER — CITALOPRAM HYDROBROMIDE 40 MG PO TABS
40.0000 mg | ORAL_TABLET | Freq: Every day | ORAL | Status: DC
  Administered 2013-04-06: 40 mg via ORAL
  Filled 2013-04-05: qty 1

## 2013-04-05 MED ORDER — INSULIN ASPART 100 UNIT/ML ~~LOC~~ SOLN: 0.0000 [IU] | Freq: Three times a day (TID) | SUBCUTANEOUS | Status: DC

## 2013-04-05 MED ORDER — CHLORZOXAZONE 500 MG PO TABS
500.0000 mg | ORAL_TABLET | Freq: Three times a day (TID) | ORAL | Status: DC | PRN
  Filled 2013-04-05: qty 1

## 2013-04-05 MED ORDER — ZOLPIDEM TARTRATE 5 MG PO TABS: 5.0000 mg | ORAL_TABLET | Freq: Every evening | ORAL | Status: DC | PRN

## 2013-04-05 MED ORDER — CITALOPRAM HYDROBROMIDE 40 MG PO TABS
40.0000 mg | ORAL_TABLET | ORAL | Status: DC
  Filled 2013-04-05: qty 1

## 2013-04-05 MED ORDER — WARFARIN - PHARMACIST DOSING INPATIENT
Freq: Every day | Status: DC
  Administered 2013-04-06: 18:00:00

## 2013-04-05 NOTE — Progress Notes (Signed)
Patient ID: Margaret Hess, female   DOB: Apr 28, 1966, 47 y.o.   MRN: [id number] PCP: Dr. Piedad Climes  Neurologist: Dr Pearlean Brownie Pain: Dr Wynn Banker  HPI: 47 yo woman with history of DM, HTN, NICM, EF <20%. 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   07/07/12 ECHO EF 20-25% grade II diastolic dysfunction  10/21/12 ECHO EF 35-40% grade 1 diastolic dysfunction  Follow up: Presents with her friend. Since last week has been having hypotension, BP 70/50. Her lisinopril was discontinued and has continued to have dizziness and now nausea. Very fatigued.Denies SOB/PND/Orthopnea. Lives at home alone but has care giver 4 days a week for 4 hours at a time. Poor appetite. Losing weigh - down 30 pounds. Weight at home 122-125 pounds.  Ambulates at home with brace and cane. Has started outpatient ST/OT/PT.  ROS: All systems negative except as listed in HPI, PMH and Problem List.  Past Medical History  Diagnosis Date  . Migraines   . Hypertension   . Diabetes mellitus   . Stroke     Current Outpatient Prescriptions  Medication Sig Dispense Refill  . acetaminophen (TYLENOL) 500 MG tablet Take 500 mg by mouth every 6 (six) hours as needed for pain.      . B-D ULTRAFINE III SHORT PEN 31G X 8 MM MISC [place] AS DIRECTED  100 each  0  . carvedilol (COREG) 6.25 MG tablet Take 1 tablet (6.25 mg total) by mouth 2 (two) times daily with a meal.  60 tablet  6  . chlorzoxazone (PARAFON) 500 MG tablet Take 500 mg by mouth 3 (three) times daily as needed for muscle spasms.      . citalopram (CELEXA) 20 MG tablet Take 20 mg by mouth daily.      Marland Kitchen DIGOX 250 MCG tablet       . DULoxetine (CYMBALTA) 30 MG capsule Take 30 mg by mouth daily.      . furosemide (LASIX) 20 MG tablet Take 1 tablet (20 mg total) by mouth daily as needed.  15 tablet  3  . gabapentin (NEURONTIN) 600 MG tablet Take 600 mg by mouth 3 (three) times daily.      Marland Kitchen HYDROcodone-acetaminophen (NORCO/VICODIN) 5-325 MG per tablet Take 1  tablet by mouth every 6 (six) hours as needed.      . insulin detemir (LEVEMIR) 100 UNIT/ML injection Inject 25 Units into the skin at bedtime.       . metFORMIN (GLUCOPHAGE) 500 MG tablet Take 1,000 mg by mouth 2 (two) times daily with a meal.       . ondansetron (ZOFRAN) 4 MG tablet       . ONE TOUCH ULTRA TEST test strip       . ONETOUCH DELICA LANCETS 33G MISC       . pantoprazole (PROTONIX) 40 MG tablet Take 40 mg by mouth daily.      Marland Kitchen spironolactone (ALDACTONE) 25 MG tablet Take 1 tablet (25 mg total) by mouth daily.  30 tablet  0  . warfarin (COUMADIN) 2.5 MG tablet Take by mouth daily. Take as directed by coumadin clinic      . zolpidem (AMBIEN) 10 MG tablet Take 5 mg by mouth at bedtime as needed. For sleep       No current facility-administered medications for this encounter.    PHYSICAL EXAM: Filed Vitals:   04/05/13 1504  BP: 68/1  Pulse: 106  Weight: 122 lb 12.8 oz (55.702 kg)  SpO2:  100%   General:  Ill appearing. No resp difficulty. Caregiver present.  HEENT: normal Neck: supple. JVP flat. Carotids 2+ bilaterally; no bruits. No lymphadenopathy or thryomegaly appreciated. Cor: PMI normal. Irregular rate & rhythm. No rubs, gallops or murmurs. Lungs: clear Abdomen: soft, nontender, nondistended. No hepatosplenomegaly. No bruits or masses. Good bowel sounds. Extremities: no cyanosis, clubbing, rash, edema. LLE brace in place.  Neuro: alert & orientedx3, cranial nerves grossly intact. Moves all 4 extremities w/o difficulty. Affect pleasant.   ASSESSMENT & PLAN:  1) Chronic systolic HF; NICM EF 35-40% - BP 68 doppler, will stop carvedilol, spironolactone, lisinopril and lasix. - Will send to ED for evaluation and more lab work  2) Hypotension  --suspect volume depletion  3) CVA, with residual expressive aphasia and L-sided weakness - Will stop meds as above, admit to ED    Patient seen and examined with Ulla Potash, NP. We discussed all aspects of the  encounter. I agree with the assessment and plan as stated above.   Attending: She is markedly hypotensive and weak. SBP is 68 by Doppler. Will send to ER for IV hydration. Hold all HF meds. May also be reasonable to check TSH aside from standard labs. Can f/u in HF Clinic in 2 weeks.   Lonza Shimabukuro,MD 4:24 PM

## 2013-04-05 NOTE — ED Notes (Signed)
Received report from [place] and given to [employer]

## 2013-04-05 NOTE — Progress Notes (Signed)
Patient admitted to 4E via stretcher from the ED.  Patient A&Ox4. Patient denies pain.  Patient is currently in no acute distress and resting comfortably at bedside. RN will continue to monitor. Louretta Parma, RN

## 2013-04-05 NOTE — Patient Instructions (Addendum)
Stop taking lisinopril, spironolactone, carvedilol and lasix.  Will follow up in 2 weeks with ECHO.

## 2013-04-05 NOTE — ED Notes (Signed)
Pt. Was at [facility] today and sent to them for hypotension.  Her BP is 71/52 upon arrival to Ed.  Pt. Is weak and nauseated and dizzy.

## 2013-04-05 NOTE — H&P (Signed)
[facility] History and Physical  Keiarra Charon ZOX:[id number] DOB: 12/20/1965 DOA: 04/05/2013  Referring physician: ED PCP: Rodney Booze  Specialists: [facility] (Cards)  Chief Complaint: Hypotension  HPI: Margaret Hess is a 47 y.o. female who presents with complaint of generalized weakness following decreased PO intake and nausea over the past few days.  She did have some NBNB vomiting over the weekend.  She was seen in the CHF clinic today and her SBP was noted to be 68.  She was sent to the ED for rehydration and further evaluation.  In the ED she was felt to be dehydrated, but other than a lactate of 4.5 her labwork was mostly unremarkable.  In the ED she was given 1.5L bolus and her BP came up to 92/63 (her SBP typically runs about 90 it seems).  She is otherwise without complaints.  ED has asked that she be admitted for overnight observation given the hypotension.  Review of Systems: 12 systems reviewed and otherwise negative.  Past Medical History  Diagnosis Date  . Migraines   . Hypertension   . Diabetes mellitus   . Stroke   . CHF (congestive heart failure)    Past Surgical History  Procedure Laterality Date  . Wisdom tooth extraction     Social History:  reports that she has never smoked. She has never used smokeless tobacco. She reports that  drinks alcohol. She reports that she does not use illicit drugs.   No Known Allergies  Family History  Problem Relation Age of Onset  . Multiple myeloma Mother   . Heart disease Father   . Hypertension Father     Prior to Admission medications   Medication Sig Start Date End Date Taking? Authorizing Provider  acetaminophen (TYLENOL) 500 MG tablet Take 500 mg by mouth every 6 (six) hours as needed for pain.   Yes Historical Provider, MD  carvedilol (COREG) 6.25 MG tablet Take 6.25 mg by mouth 2 (two) times daily with a meal.   Yes Historical Provider, MD  chlorzoxazone (PARAFON) 500 MG tablet Take 500 mg  by mouth 3 (three) times daily as needed for muscle spasms.   Yes Historical Provider, MD  citalopram (CELEXA) 20 MG tablet Take 40 mg by mouth every morning.    Yes Historical Provider, MD  DULoxetine (CYMBALTA) 60 MG capsule Take 60 mg by mouth daily.   Yes Historical Provider, MD  gabapentin (NEURONTIN) 800 MG tablet Take 800-1,600 mg by mouth 3 (three) times daily. Takes 1 tablet in morning, 1 tablet at lunch, and 2 tablets at bedtime   Yes Historical Provider, MD  HYDROcodone-acetaminophen (NORCO/VICODIN) 5-325 MG per tablet Take 1 tablet by mouth 2 (two) times daily.    Yes Historical Provider, MD  insulin detemir (LEVEMIR) 100 UNIT/ML injection Inject 10 Units into the skin at bedtime.    Yes Historical Provider, MD  lisinopril (PRINIVIL,ZESTRIL) 5 MG tablet Take 5 mg by mouth 2 (two) times daily.   Yes Historical Provider, MD  metFORMIN (GLUCOPHAGE) 1000 MG tablet Take 1,000 mg by mouth 2 (two) times daily with a meal.   Yes Historical Provider, MD  pantoprazole (PROTONIX) 40 MG tablet Take 40 mg by mouth every morning.    Yes Historical Provider, MD  spironolactone (ALDACTONE) 25 MG tablet Take 25 mg by mouth daily.   Yes Historical Provider, MD  warfarin (COUMADIN) 2.5 MG tablet Take 2.5-5 mg by mouth daily. Takes 5mg  all days of week, except Mondays-takes 2.5mg .   Yes Historical  Provider, MD  zolpidem (AMBIEN) 5 MG tablet Take 5 mg by mouth at bedtime as needed for sleep.   Yes Historical Provider, MD   Physical Exam: Filed Vitals:   04/05/13 2030  BP: 92/64  Pulse: 81  Temp:   Resp: 10    General:  NAD, resting comfortably in bed, pale complexion (chronic apparently per Dr. Gala Romney) Eyes: PEERLA EOMI ENT: mucous membranes moist Neck: supple w/o JVD Cardiovascular: RRR w/o MRG Respiratory: CTA B Abdomen: soft, nt, nd, bs+ Skin: no rash nor lesion Musculoskeletal: MAE, full ROM all 4 extremities Psychiatric: normal tone and affect Neurologic: AAOx3, grossly  non-focal  Labs on Admission:  Basic Metabolic Panel:  Recent Labs Lab 04/05/13 1533  NA 134*  K 4.4  CL 96  CO2 23  GLUCOSE 97  BUN 26*  CREATININE 0.82  CALCIUM 10.1   Liver Function Tests: No results found for this basename: AST, ALT, ALKPHOS, BILITOT, PROT, ALBUMIN,  in the last 168 hours No results found for this basename: LIPASE, AMYLASE,  in the last 168 hours No results found for this basename: AMMONIA,  in the last 168 hours CBC:  Recent Labs Lab 04/05/13 1542  WBC 9.3  HGB 12.8  HCT 36.8  MCV 87.8  PLT 367   Cardiac Enzymes:  Recent Labs Lab 04/05/13 1749  TROPONINI <0.30    BNP (last 3 results)  Recent Labs  06/15/12 1720 06/17/12 0455 07/20/12 0600  PROBNP 6932.0* 1930.0* 891.1*   CBG: No results found for this basename: GLUCAP,  in the last 168 hours  Radiological Exams on Admission: Dg Chest Portable 1 View  04/05/2013   *RADIOLOGY REPORT*  Clinical Data: Hypotension, abdominal pain, history CHF, diabetes  PORTABLE CHEST - 1 VIEW  Comparison: Portable exam 1854 hours compared to 06/21/2012  Findings: Normal heart size, mediastinal contours, and pulmonary vascularity. Minimal chronic peribronchial thickening. No pulmonary infiltrate, pleural effusion or pneumothorax. Numerous EKG leads project over chest. No acute osseous findings.  IMPRESSION: No acute abnormalities.   Original Report Authenticated By: Ulyses Southward, M.D.    EKG: Independently reviewed.  Assessment/Plan Principal Problem:   Hypotension Active Problems:   Diabetes mellitus   Chronic systolic heart failure   Long term (current) use of anticoagulants   1. Hypotension - appears to have been due to dehydration and resolved with 2L of fluid or at least back to her baseline BP.  Holding all BP meds at this time, additionally recheck BMP in AM to ensure no AKI develops, also checking a random cortisol level to eval for possible addison's / adrenal crisis with the N/V, but from  history it sounds as though her BP is always running in the 90s in recent months.  Dr. Gala Romney will see patient tomorrow, would wait to restart any BP meds until he can evaluate. 2. DM - holding metformin and home insulin and putting patient on SSI AC/HS while here 3. Chronic systolic CHF - most recent EF was 40% improved from the 20% last October, was going to order a 2D Echo on this admission but it appears as though Dr. Gala Romney has already ordered one during today's office visit (may have already done in office or scheduled this) so will hold off on ordering this inpatient at this time. 4. Long term coumadin use - continue coumadin per pharm consult (suspect they will hold it today / tomorrow at least, as her INR is supratheraputic at 3.95 today).    Code Status: Full Code (must  indicate code status--if unknown or must be presumed, indicate so) Family Communication: No family in room (indicate person spoken with, if applicable, with phone number if by telephone) Disposition Plan: Admit to obs (indicate anticipated LOS)  Time spent: 70 min  Khristie Sak M. [facility] Pager [phone]  If 7PM-7AM, please contact night-coverage [email or web address] Password [facility] 04/05/2013, 8:51 PM

## 2013-04-05 NOTE — ED Provider Notes (Signed)
CSN: [id number]     Arrival date & time 04/05/13  1631 History     First MD Initiated Contact with Patient 04/05/13 1712     Chief Complaint  Patient presents with  . Hypotension    HPI Patient reports decreased oral intake over the past several days.  She's had nausea.  She denies vomiting today.  She did have some nonbloody nonbilious vomiting to the weekend.  She denies diarrhea.  No melena or hematochezia.  No chest pain shortness of breath.  No abdominal pain.  She's being seen in the heart failure clinic today and her blood pressure is noted to be 68 systolic and that she was in the emergency apartment for additional evaluation.  She denies fevers and chills.  She otherwise without complaints.  She does report that she feels slightly weak.   Past Medical History  Diagnosis Date  . Migraines   . Hypertension   . Diabetes mellitus   . Stroke   . CHF (congestive heart failure)    Past Surgical History  Procedure Laterality Date  . Wisdom tooth extraction     Family History  Problem Relation Age of Onset  . Multiple myeloma Mother   . Heart disease Father   . Hypertension Father    History  Substance Use Topics  . Smoking status: Never Smoker   . Smokeless tobacco: Never Used  . Alcohol Use: Yes     Comment: 1 mixed drink every 6 months   OB History   Grav Para Term Preterm Abortions TAB SAB Ect Mult Living                 Review of Systems  All other systems reviewed and are negative.    Allergies  Review of patient's allergies indicates no known allergies.  Home Medications   Current Outpatient Rx  Name  Route  Sig  Dispense  Refill  . acetaminophen (TYLENOL) 500 MG tablet   Oral   Take 500 mg by mouth every 6 (six) hours as needed for pain.         . carvedilol (COREG) 6.25 MG tablet   Oral   Take 6.25 mg by mouth 2 (two) times daily with a meal.         . chlorzoxazone (PARAFON) 500 MG tablet   Oral   Take 500 mg by mouth 3 (three) times  daily as needed for muscle spasms.         . citalopram (CELEXA) 20 MG tablet   Oral   Take 40 mg by mouth every morning.          . DULoxetine (CYMBALTA) 60 MG capsule   Oral   Take 60 mg by mouth daily.         Marland Kitchen gabapentin (NEURONTIN) 800 MG tablet   Oral   Take 800-1,600 mg by mouth 3 (three) times daily. Takes 1 tablet in morning, 1 tablet at lunch, and 2 tablets at bedtime         . HYDROcodone-acetaminophen (NORCO/VICODIN) 5-325 MG per tablet   Oral   Take 1 tablet by mouth 2 (two) times daily.          . insulin detemir (LEVEMIR) 100 UNIT/ML injection   Subcutaneous   Inject 10 Units into the skin at bedtime.          Marland Kitchen lisinopril (PRINIVIL,ZESTRIL) 5 MG tablet   Oral   Take 5 mg by mouth 2 (two) times daily.         Marland Kitchen  metFORMIN (GLUCOPHAGE) 1000 MG tablet   Oral   Take 1,000 mg by mouth 2 (two) times daily with a meal.         . pantoprazole (PROTONIX) 40 MG tablet   Oral   Take 40 mg by mouth every morning.          Marland Kitchen spironolactone (ALDACTONE) 25 MG tablet   Oral   Take 25 mg by mouth daily.         Marland Kitchen warfarin (COUMADIN) 2.5 MG tablet   Oral   Take 2.5-5 mg by mouth daily. Takes 5mg  all days of week, except Mondays-takes 2.5mg .         . zolpidem (AMBIEN) 5 MG tablet   Oral   Take 5 mg by mouth at bedtime as needed for sleep.          BP 81/52  Pulse 88  Temp(Src) 97.6 F (36.4 C) (Oral)  Resp 17  SpO2 100% Physical Exam  Nursing note and vitals reviewed. Constitutional: She is oriented to person, place, and time. She appears well-developed and well-nourished. No distress.  HENT:  Head: Normocephalic and atraumatic.  Eyes: EOM are normal.  Neck: Normal range of motion.  Cardiovascular: Normal rate, regular rhythm and normal heart sounds.   Pulmonary/Chest: Effort normal and breath sounds normal.  Abdominal: Soft. She exhibits no distension. There is no tenderness.  Musculoskeletal: Normal range of motion.   Neurological: She is alert and oriented to person, place, and time.  Skin: Skin is warm and dry.  Psychiatric: She has a normal mood and affect. Judgment normal.    ED Course   Procedures    Date: 04/05/2013  Rate: 84  Rhythm: normal sinus rhythm  QRS Axis: normal  Intervals: normal  ST/T Wave abnormalities: normal  Conduction Disutrbances: none  Narrative Interpretation:   Old EKG Reviewed: No significant changes noted     Labs Reviewed  PROTIME-INR - Abnormal; Notable for the following:    Prothrombin Time 37.1 (*)    INR 3.95 (*)    All other components within normal limits  LACTIC ACID, PLASMA - Abnormal; Notable for the following:    Lactic Acid, Venous 4.5 (*)    All other components within normal limits  URINALYSIS, ROUTINE W REFLEX MICROSCOPIC - Abnormal; Notable for the following:    APPearance CLOUDY (*)    Hgb urine dipstick LARGE (*)    Leukocytes, UA TRACE (*)    All other components within normal limits  DIGOXIN LEVEL - Abnormal; Notable for the following:    Digoxin Level <0.3 (*)    All other components within normal limits  URINE MICROSCOPIC-ADD ON - Abnormal; Notable for the following:    Squamous Epithelial / LPF FEW (*)    Bacteria, UA FEW (*)    Casts HYALINE CASTS (*)    All other components within normal limits  CULTURE, BLOOD (ROUTINE X 2)  CULTURE, BLOOD (ROUTINE X 2)  URINE CULTURE  TROPONIN I   Dg Chest Portable 1 View  04/05/2013   *RADIOLOGY REPORT*  Clinical Data: Hypotension, abdominal pain, history CHF, diabetes  PORTABLE CHEST - 1 VIEW  Comparison: Portable exam 1854 hours compared to 06/21/2012  Findings: Normal heart size, mediastinal contours, and pulmonary vascularity. Minimal chronic peribronchial thickening. No pulmonary infiltrate, pleural effusion or pneumothorax. Numerous EKG leads project over chest. No acute osseous findings.  IMPRESSION: No acute abnormalities.   Original Report Authenticated By: Ulyses Southward, M.D.   I  personally  reviewed the imaging tests through PACS system I reviewed available ER/hospitalization records through the EMR  1. Volume depletion   2. Hypotension     MDM  Patient's blood pressure increased to 1.5 L of IV fluids.  Systolic up to 90.  He seems to be around her baseline.  Patient will benefit from observational admission.  Her failure team.  I see her tomorrow.  Her EF is around 40% per the heart failure physician  Lyanne Co, MD 04/05/13 2023

## 2013-04-05 NOTE — Progress Notes (Signed)
ANTICOAGULATION CONSULT NOTE - Initial Consult  Pharmacy Consult for coumadin Indication: history LV thrombus, CVA  No Known Allergies  Patient Measurements: Wt= 56kg  Vital Signs: Temp: 97.6 F (36.4 C) (08/04 1834) Temp src: Oral (08/04 1834) BP: 92/64 mmHg (08/04 2030) Pulse Rate: 81 (08/04 2030)  Labs:  Recent Labs  04/05/13 1533 04/05/13 1542 04/05/13 1749  HGB  --  12.8  --   HCT  --  36.8  --   PLT  --  367  --   LABPROT  --   --  37.1*  INR  --   --  3.95*  CREATININE 0.82  --   --   TROPONINI  --   --  <0.30    The CrCl is unknown because both a height and weight (above a minimum accepted value) are required for this calculation.   Medical History: Past Medical History  Diagnosis Date  . Migraines   . Hypertension   . Diabetes mellitus   . Stroke   . CHF (congestive heart failure)     Assessment: 47 yo female here with hypotension. She is on coumadin PTA for history of LV thrombus and CVA to continue therapy while admitted.  INR today= 3.95  Home coumadin dose: 5mg /day except 2.5mg  on M (last dose taken 04/05/13)  Goal of Therapy:  INR 2-3 Monitor platelets by anticoagulation protocol: Yes   Plan:  -Hold coumadin for now -Daily PT/INR  Harland German, Pharm D 04/05/2013 9:01 PM

## 2013-04-06 DIAGNOSIS — R791 Abnormal coagulation profile: Secondary | ICD-10-CM

## 2013-04-06 DIAGNOSIS — I10 Essential (primary) hypertension: Secondary | ICD-10-CM

## 2013-04-06 DIAGNOSIS — I6350 Cerebral infarction due to unspecified occlusion or stenosis of unspecified cerebral artery: Secondary | ICD-10-CM

## 2013-04-06 DIAGNOSIS — Z7901 Long term (current) use of anticoagulants: Secondary | ICD-10-CM

## 2013-04-06 DIAGNOSIS — I5041 Acute combined systolic (congestive) and diastolic (congestive) heart failure: Secondary | ICD-10-CM

## 2013-04-06 DIAGNOSIS — E119 Type 2 diabetes mellitus without complications: Secondary | ICD-10-CM

## 2013-04-06 DIAGNOSIS — I517 Cardiomegaly: Secondary | ICD-10-CM

## 2013-04-06 LAB — GLUCOSE, CAPILLARY
Glucose-Capillary: 102 mg/dL — ABNORMAL HIGH (ref 70–99)
Glucose-Capillary: 90 mg/dL (ref 70–99)

## 2013-04-06 LAB — COMPREHENSIVE METABOLIC PANEL
AST: 12 U/L (ref 0–37)
CO2: 24 mEq/L (ref 19–32)
Calcium: 8.8 mg/dL (ref 8.4–10.5)
Creatinine, Ser: 0.72 mg/dL (ref 0.50–1.10)
GFR calc non Af Amer: >90 mL/min (ref >90)

## 2013-04-06 LAB — CBC
Hemoglobin: 10 g/dL — ABNORMAL LOW (ref 12.0–15.0)
MCH: 31.1 pg (ref 26.0–34.0)
MCV: 89.1 fL (ref 78.0–100.0)
Platelets: 285 10*3/uL (ref 150–400)
RBC: 3.22 MIL/uL — ABNORMAL LOW (ref 3.87–5.11)
WBC: 8 10*3/uL (ref 4.0–10.5)

## 2013-04-06 LAB — PROTIME-INR
INR: 4.92 — ABNORMAL HIGH (ref 0.00–1.49)
INR: 4.62 — ABNORMAL HIGH (ref 0.00–1.49)
Prothrombin Time: 43.9 seconds — ABNORMAL HIGH (ref 11.6–15.2)
Prothrombin Time: 41.8 seconds — ABNORMAL HIGH (ref 11.6–15.2)

## 2013-04-06 LAB — BASIC METABOLIC PANEL
CO2: 23 mEq/L (ref 19–32)
Chloride: 106 mEq/L (ref 96–112)
Creatinine, Ser: 0.76 mg/dL (ref 0.50–1.10)
Glucose, Bld: 106 mg/dL — ABNORMAL HIGH (ref 70–99)
Sodium: 137 mEq/L (ref 135–145)

## 2013-04-06 MED ORDER — SODIUM CHLORIDE 0.9 % IV SOLN
INTRAVENOUS | Status: DC
  Administered 2013-04-06: 1000 mL via INTRAVENOUS

## 2013-04-06 NOTE — Progress Notes (Signed)
Pt requesting to know if d/c papers are ready.  Noted that orders are not yet ready when chart checked.  Pt made aware and Dr. Joseph Art is aware that pt's ride is here and waiting  To be d/c'd.   Amanda Pea, Charity fundraiser.

## 2013-04-06 NOTE — Progress Notes (Signed)
Advanced Heart Failure Rounding Note   Subjective:    Yesterday she was admitted from [facility] ED after being seen in the HF clinic with hypotension.   HF meds stopped. IV fluid given.   ECHO today reviewed and discussed by Dr Gala Romney  - EF 40%   Overall feels better. Denies SOB/PND/Orthopnea    Objective:   Weight Range:  Vital Signs:   Temp:  [97.3 F (36.3 C)-97.6 F (36.4 C)] 97.4 F (36.3 C) (08/05 0509) Pulse Rate:  [64-107] 80 (08/05 0509) Resp:  [10-22] 19 (08/05 0509) BP: (68-93)/(1-64) 93/53 mmHg (08/05 0509) SpO2:  [99 %-100 %] 99 % (08/05 0509) Weight:  [122 lb 12.8 oz (55.702 kg)-127 lb 4.8 oz (57.743 kg)] 122 lb 14.4 oz (55.747 kg) (08/05 0509) Last BM Date: 04/05/13  Weight change: Filed Weights   04/05/13 2218 04/06/13 0509  Weight: 127 lb 4.8 oz (57.743 kg) 122 lb 14.4 oz (55.747 kg)    Intake/Output:   Intake/Output Summary (Last 24 hours) at 04/06/13 1206 Last data filed at 04/06/13 1031  Gross per 24 hour  Intake    360 ml  Output   1150 ml  Net   -790 ml     Physical Exam: General:  Well appearing. No resp difficulty Sitting chair  HEENT: normal Neck: supple. JVP flat . Carotids 2+ bilat; no bruits. No lymphadenopathy or thryomegaly appreciated. Cor: PMI nondisplaced. Regular rate & rhythm. No rubs, gallops or murmurs. Lungs: clear Abdomen: soft, nontender, nondistended. No hepatosplenomegaly. No bruits or masses. Good bowel sounds. Extremities: no cyanosis, clubbing, rash, edema LLE brace  Neuro: alert & orientedx3, cranial nerves grossly intact. moves all 4 extremities w/o difficulty. Affect pleasant   Labs: Basic Metabolic Panel:  Recent Labs Lab 04/05/13 1533 04/06/13 0500  NA 134* 137  K 4.4 3.8  CL 96 106  CO2 23 23  GLUCOSE 97 106*  BUN 26* 21  CREATININE 0.82 0.76  CALCIUM 10.1 8.5    Liver Function Tests: No results found for this basename: AST, ALT, ALKPHOS, BILITOT, PROT, ALBUMIN,  in the last 168 hours No  results found for this basename: LIPASE, AMYLASE,  in the last 168 hours No results found for this basename: AMMONIA,  in the last 168 hours  CBC:  Recent Labs Lab 04/05/13 1542 04/06/13 0500  WBC 9.3 8.0  HGB 12.8 10.0*  HCT 36.8 28.7*  MCV 87.8 89.1  PLT 367 285    Cardiac Enzymes:  Recent Labs Lab 04/05/13 1749  TROPONINI <0.30    BNP: BNP (last 3 results)  Recent Labs  06/15/12 1720 06/17/12 0455 07/20/12 0600  PROBNP 6932.0* 1930.0* 891.1*     Other results:     Imaging: Dg Chest Portable 1 View  04/05/2013   *RADIOLOGY REPORT*  Clinical Data: Hypotension, abdominal pain, history CHF, diabetes  PORTABLE CHEST - 1 VIEW  Comparison: Portable exam 1854 hours compared to 06/21/2012  Findings: Normal heart size, mediastinal contours, and pulmonary vascularity. Minimal chronic peribronchial thickening. No pulmonary infiltrate, pleural effusion or pneumothorax. Numerous EKG leads project over chest. No acute osseous findings.  IMPRESSION: No acute abnormalities.   Original Report Authenticated By: Ulyses Southward, M.D.      Medications:     Scheduled Medications: . citalopram  40 mg Oral Daily  . DULoxetine  60 mg Oral Daily  . gabapentin  1,600 mg Oral QHS  . gabapentin  800 mg Oral BID WC  . insulin aspart  0-15 Units Subcutaneous TID  WC  . pantoprazole  40 mg Oral q morning - 10a  . sodium chloride  3 mL Intravenous Q12H  . sodium chloride  3 mL Intravenous Q12H  . Warfarin - Pharmacist Dosing Inpatient   Does not apply q1800     Infusions: . sodium chloride       PRN Medications:  sodium chloride, acetaminophen, chlorzoxazone, HYDROcodone-acetaminophen, sodium chloride, zolpidem   Assessment  1. Hypotension 2. 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 BP  improved but SBP < 100.    Dr Gala Romney reviewed and discussed ECHO. EF 40-45%.  BP improved but SBP < 100.  Continue to hold all HF meds. Follow up in HF clinic in 2 weeks.   Length of Stay: 1  CLEGG,AMY 04/06/2013, 12:06 PM  Patient seen and examined with Tonye Becket, NP. We discussed all aspects of the encounter. I agree with the assessment and plan as stated above.   BP improved but still quite soft. EF better on echo. Will hold all HF meds. OK for d/c home today. Will f/u in HF Clinic in 2 weeks and attempt to restart low-dose b-blocker. Appreciate [facility] care.   Advanced Heart Failure Team Pager [phone] (M-F; 7a - 4p)  Please contact [facility] for night-coverage after hours (4p -7a ) and weekends on [email or web address]

## 2013-04-06 NOTE — Progress Notes (Signed)
ANTICOAGULATION CONSULT NOTE - Follow Up Consult  Pharmacy Consult for Coumadin Indication: history LV thrombus, CVA  No Known Allergies  Patient Measurements: Height: 5\' 3"  (160 cm) Weight: 122 lb 14.4 oz (55.747 kg) (Scale A) IBW/kg (Calculated) : 52.4  Vital Signs: Temp: 97.4 F (36.3 C) (08/05 0509) Temp src: Oral (08/05 0509) BP: 93/53 mmHg (08/05 0509) Pulse Rate: 80 (08/05 0509)  Labs:  Recent Labs  04/05/13 1533 04/05/13 1542 04/05/13 1749 04/06/13 0500  HGB  --  12.8  --  10.0*  HCT  --  36.8  --  28.7*  PLT  --  367  --  285  LABPROT  --   --  37.1* 43.9*  INR  --   --  3.95* 4.92*  CREATININE 0.82  --   --  0.76  TROPONINI  --   --  <0.30  --     Estimated Creatinine Clearance: 72.7 ml/min (by C-G formula based on Cr of 0.76).  Assessment: 47 y/o female admitted with hypotension. She takes chronic Coumadin for history LV thrombus and CVA. INR remains supratherapeutic this morning. No bleeding noted, H/H lower than admit but may be dilutional, platelets wnl.  Goal of Therapy:  INR 2-3 Monitor platelets by anticoagulation protocol: Yes   Plan:  -No Coumadin tonight -INR daily -Monitor for signs/symptoms of bleeding  [facility], [address].D., BCPS Clinical Pharmacist Pager: [phone] 04/06/2013 9:49 AM

## 2013-04-06 NOTE — Evaluation (Signed)
Physical Therapy Evaluation Patient Details Name: Margaret Hess MRN: [id number] DOB: Nov 28, 1965 Today's Date: 04/06/2013 Time: 4098-1191 PT Time Calculation (min): 26 min  PT Assessment / Plan / Recommendation History of Present Illness  47 y.o. female admitted to [facility] for syncope and hypotension.  She has a recent h/o stroke with left sided hemiperesis and aphasia.  She is currently going to OP PT/OT/ST at [facility].    Clinical Impression  The pt is moving well and is likely close to her baseline PTA.  BP continues to be low, but she was asymptomatic with gait for me.  She was receiving out patient physical, occupational, and speech therapy prior to admission and will need a new prescription from the MD stating it is ok for her to resume PT/OT/SLP.      PT Assessment  Patient needs continued PT services    Follow Up Recommendations  Outpatient PT;Other (comment) (resume all OP therapies PT/OT/SLP)    Does the patient have the potential to tolerate intense rehabilitation     Yes  Barriers to Discharge   None None    Equipment Recommendations  None recommended by PT    Recommendations for Other Services   None  Frequency Min 3X/week    Precautions / Restrictions Precautions Precautions: Fall Precaution Comments: left hemiperesis Required Braces or Orthoses: Other Brace/Splint Other Brace/Splint: L leg AFO   Pertinent Vitals/Pain See vitals flow sheet.      Mobility  Bed Mobility Bed Mobility: Not assessed (pt seated on EOB) Transfers Transfers: Sit to Stand;Stand to Sit Sit to Stand: 5: Supervision;With upper extremity assist;From bed;From toilet Stand to Sit: 5: Supervision;With upper extremity assist;With armrests;To chair/3-in-1;To toilet Details for Transfer Assistance: supervision for safety due to syncope with this admission to the hospital and continued low BPs Ambulation/Gait Ambulation/Gait Assistance: 4: Min assist Ambulation Distance (Feet): 100  Feet Assistive device: Small based quad cane Ambulation/Gait Assistance Details: min assist for one LOB during gait when distracted by talking and looking to her left.  Otherwise, supervision overall with gait.  AFO donned by the pt assist needed to get brace in shoe on left due to she did not have her shoe horn with her here in the hospital.   Gait Pattern: Step-to pattern;Decreased hip/knee flexion - left;Decreased dorsiflexion - left;Left circumduction Gait velocity: less than 1.8 ft/sec putting her at risk for recurrent falls General Gait Details: no report of lightheadedness during gait.          PT Diagnosis: Difficulty walking;Abnormality of gait;Generalized weakness;Hemiplegia non-dominant side  PT Problem List: Decreased strength;Decreased activity tolerance;Decreased balance;Decreased mobility;Decreased coordination;Impaired sensation PT Treatment Interventions: DME instruction;Gait training;Functional mobility training;Therapeutic activities;Therapeutic exercise;Balance training;Neuromuscular re-education;Patient/family education     PT Goals(Current goals can be found in the care plan section) Acute Rehab PT Goals Patient Stated Goal: to go home and get back to therapy at [facility] PT Goal Formulation: With patient Time For Goal Achievement: 04/20/13 Potential to Achieve Goals: Good  Visit Information  Last PT Received On: 04/06/13 Assistance Needed: +1 History of Present Illness: 47 y.o. female admitted to [facility] for syncope and hypotension.  She has a recent h/o stroke with left sided hemiperesis and aphasia.  She is currently going to OP PT/OT/ST at [facility].         Prior Functioning  Home Living Family/patient expects to be discharged to:: Private residence Living Arrangements: Alone Available Help at Discharge: Friend(s);Family;Available PRN/intermittently Type of Home: House Home Access: Level entry  Home Layout: One level Home Equipment: Cane -  quad Additional Comments: Pt lives alone and has a personal caregiver 4 days per week for 3 hours per day who helps her get to her appointments and go to the store.   Prior Function Level of Independence: Independent with assistive device(s);Needs assistance Gait / Transfers Assistance Needed: modified independent with quad cane ADL's / Homemaking Assistance Needed: cooking and cleaning needs assistance Communication / Swallowing Assistance Needed: expressive difficulties Comments: does not drive  Communication Communication: Expressive difficulties Dominant Hand: Right    Cognition  Cognition Arousal/Alertness: Awake/alert Behavior During Therapy: WFL for tasks assessed/performed Overall Cognitive Status: Within Functional Limits for tasks assessed    Extremity/Trunk Assessment Upper Extremity Assessment Upper Extremity Assessment: LUE deficits/detail LUE Deficits / Details: hemiperetic left arm in flexion synergy.   LUE Sensation: decreased light touch LUE Coordination: decreased fine motor;decreased gross motor Lower Extremity Assessment Lower Extremity Assessment: LLE deficits/detail LLE Deficits / Details: left leg hemiperesis with decreased strength throughout with AFO for left foot drop.  LLE Sensation: decreased light touch LLE Coordination: decreased fine motor;decreased gross motor Cervical / Trunk Assessment Cervical / Trunk Assessment: Normal   Balance    End of Session PT - End of Session Activity Tolerance: Patient limited by fatigue Patient left: in chair;with call bell/phone within reach Nurse Communication: Mobility status;Other (comment) (need for new prescription from MD to resume OP therapies)  GP Functional Assessment Tool Used: gait speed Functional Limitation: Mobility: Walking and moving around Mobility: Walking and Moving Around Current Status (Z6109): At least 1 percent but less than 20 percent impaired, limited or restricted Mobility: Walking and  Moving Around Goal Status [phone]): At least 1 percent but less than 20 percent impaired, limited or restricted   Ailanie Ruttan B. Beckett Maden, PT, DPT [phone]   04/06/2013, 4:46 PM

## 2013-04-06 NOTE — Progress Notes (Signed)
Patient discharged home with family.  Reviewed follow up instructions per MD.  Patient voiced understanding of all instructions and medications.  Idolina Primer RN

## 2013-04-06 NOTE — Discharge Summary (Addendum)
Physician Discharge Summary  Margaret Hess ZOX:[id number] DOB: 11-02-1965 DOA: 04/05/2013  PCP: Rodney Booze  Admit date: 04/05/2013 Discharge date: 04/06/2013  Time spent: 40 minutes  Recommendations for Outpatient Follow-up:   1. Hypotension - patient's urine has healing casted and her BUN/creatinine ratio is greater than 2/1 which would suggest patient is prerenal. We'll continue patient's fluids of normal saline at 50 ml/hr. watch for any fluid overload This taken along with patient's resolution of hypotension with fluid again supports a prerenal etiology. Agree with holding BP meds until Dr. Gala Romney evaluates.Dr. Gala Romney complete patient's echocardiogram and stated patient to be discharged    2. DM - restart metformin. Normally metformin, counterindicated if creatinine          > 1.4. We'll also transition back to home insulin regimen after we obtain         test later this afternoon.  3.  Chronic systolic CHF - most recent EF was 40% improved from the 20% last October, was going to order a 2D Echo on this admission but it appears as though Dr. Gala Romney has already ordered one during today's office visit (may have already done in office or scheduled this) echocardiogram already in order  4. Left ventricle mural thrombus (Long term coumadin use - continue coumadin per pharm consult (suspect they will hold it today / tomorrow at least, as her INR is supratheraputic at 3.95 today) INR has increased to 4.92. Repeat INR= 4.62  Will not restart Coumadin. Patient will need to have her INR checked at [facility] [place] this week.    Procedures: Echocardiogram 10/22/2012 - Left ventricle: Systolic function was moderately reduced. LVEF= 35% to 40%. Diffuse hypokinesis.  Doppler parameters c/w abnormal left ventricular relaxation (grade 1 diastolic dysfunction). - Aortic valve: There was no stenosis. - Mitral valve: No significant regurgitation. - Right ventricle: The  cavity size was normal. Systolic function was normal. - Pulmonary arteries: No complete TR doppler jet so unable to estimate PA systolic pressure.  - Normal LV size with moderate global hypokinesis, EF 35-40%. Normal RV size and systolic function. No  Echocardiogram 04/06/2013   - Left ventricle: The cavity size was normal. Wall thickness was normal. Systolic function was moderately reduced. The estimated ejection fraction was in the range of 35% to 40%. Diffuse hypokinesis. Doppler parameters are consistent with abnormal left ventricular relaxation (grade 1 diastolic dysfunction). - Aortic valve: Poorly visualized. There was no stenosis. - Mitral valve: No significant regurgitation. - Left atrium: The atrium was mildly dilated. - Right ventricle: The cavity size was normal. Systolic function was normal. - Pulmonary arteries: PA peak pressure: 25mm Hg (S). - Systemic veins: IVC not visualized. Impressions:    Discharge Diagnoses:  Principal Problem:   Hypotension Active Problems:   LV (left ventricular) mural thrombus without MI   Diabetes mellitus   Pulmonary hypertension   Chronic systolic heart failure   CVA (cerebral infarction)   Long term (current) use of anticoagulants   Discharge Condition: Stable  Diet recommendation: Diabetic, heart healthy  Filed Weights   04/05/13 2218 04/06/13 0509  Weight: 57.743 kg (127 lb 4.8 oz) 55.747 kg (122 lb 14.4 oz)    History of present illness:  HPI/Subjective: Margaret Hess is a 47 y.o. PMHx chronic systolic heart failure (on long-term anticoagulants), DM, hypertension, CVA, hypotension, LV mural thrombus. Presented with complaint of generalized weakness following decreased PO intake and nausea over the past few days. She did have some NBNB vomiting over the weekend.  She was seen in the CHF clinic today and her SBP was noted to be 68. She was sent to the ED for rehydration and further evaluation. In the ED she was felt to be  dehydrated, but other than a lactate of 4.5 her labwork was mostly unremarkable. In the ED she was given 1.5L bolus and her BP came up to 92/63 (her SBP typically runs about 90 it seems). She is otherwise without complaints. ED has asked that she be admitted for overnight observation given the hypotension. INR=3.95, lactic acid= 4.5 TODAY states symptoms of nausea and vomiting, lightheadedness have resolved   Procedures:  Echocardiograml;    Consultations: Cardiology   Discharge Exam: Filed Vitals:   04/05/13 2100 04/05/13 2115 04/05/13 2218 04/06/13 0509  BP:  90/60 89/61 93/53   Pulse:  77 64 80  Temp:   97.3 F (36.3 C) 97.4 F (36.3 C)  TempSrc:   Oral Oral  Resp: 15  18 19   Height:   5\' 3"  (1.6 m)   Weight:   57.743 kg (127 lb 4.8 oz) 55.747 kg (122 lb 14.4 oz)  SpO2:  100% 99% 99%    General: Alert,NAD Cardiovascular: Regular rhythm and rate, negative murmurs rubs or gallops, DP/PT pulses 2+ bilateral Respiratory: Good auscultation bilaterally  Neurologic; pupils equal round reactive to light and accommodation, cranial nerves II through XII intact, patient does have some expressive aphasia, patient does have lower extremity weakness or left with a foot drop. (Residual from previous CVA)  Discharge Instructions   Future Appointments Provider Department Dept Phone   04/15/2013 3:00 PM [facility] ECHO [facility] [phone]   04/15/2013 4:00 PM [facility] [phone]   04/21/2013 2:30 PM Ronal Fear, NP [facility] [phone]   04/30/2013 1:00 PM Erick Colace, MD [facility] [phone]   Leave all valuables at home.       Medication List    ASK your doctor about these medications       acetaminophen 500 MG tablet  Commonly known as:  TYLENOL  Take 500 mg by mouth every 6 (six) hours as needed for pain.     carvedilol 6.25 MG  tablet  Commonly known as:  COREG  Take 6.25 mg by mouth 2 (two) times daily with a meal.     chlorzoxazone 500 MG tablet  Commonly known as:  PARAFON  Take 500 mg by mouth 3 (three) times daily as needed for muscle spasms.     citalopram 20 MG tablet  Commonly known as:  CELEXA  Take 40 mg by mouth every morning.     DULoxetine 60 MG capsule  Commonly known as:  CYMBALTA  Take 60 mg by mouth daily.     gabapentin 800 MG tablet  Commonly known as:  NEURONTIN  Take 800-1,600 mg by mouth 3 (three) times daily. Takes 1 tablet in morning, 1 tablet at lunch, and 2 tablets at bedtime     HYDROcodone-acetaminophen 5-325 MG per tablet  Commonly known as:  NORCO/VICODIN  Take 1 tablet by mouth 2 (two) times daily.     insulin detemir 100 UNIT/ML injection  Commonly known as:  LEVEMIR  Inject 10 Units into the skin at bedtime.     lisinopril 5 MG tablet  Commonly known as:  PRINIVIL,ZESTRIL  Take 5 mg by mouth 2 (two) times daily.     metFORMIN 1000 MG tablet  Commonly known as:  GLUCOPHAGE  Take 1,000 mg by mouth 2 (two) times daily with a meal.     pantoprazole 40 MG tablet  Commonly known as:  PROTONIX  Take 40 mg by mouth every morning.     spironolactone 25 MG tablet  Commonly known as:  ALDACTONE  Take 25 mg by mouth daily.     warfarin 2.5 MG tablet  Commonly known as:  COUMADIN  Take 2.5-5 mg by mouth daily. Takes 5mg  all days of week, except Mondays-takes 2.5mg .     zolpidem 5 MG tablet  Commonly known as:  AMBIEN  Take 5 mg by mouth at bedtime as needed for sleep.       No Known Allergies     Follow-up Information   Follow up with FULBRIGHT,VIRGINIA, PA-C.   Contact information:   [address] [place] [address]        The results of significant diagnostics from this hospitalization (including imaging, microbiology, ancillary and laboratory) are listed below for reference.    Significant Diagnostic Studies: Dg Chest Portable 1  View  04/05/2013   *RADIOLOGY REPORT*  Clinical Data: Hypotension, abdominal pain, history CHF, diabetes  PORTABLE CHEST - 1 VIEW  Comparison: Portable exam 1854 hours compared to 06/21/2012  Findings: Normal heart size, mediastinal contours, and pulmonary vascularity. Minimal chronic peribronchial thickening. No pulmonary infiltrate, pleural effusion or pneumothorax. Numerous EKG leads project over chest. No acute osseous findings.  IMPRESSION: No acute abnormalities.   Original Report Authenticated By: Ulyses Southward, M.D.    Microbiology: No results found for this or any previous visit (from the past 240 hour(s)).   Labs: Basic Metabolic Panel:  Recent Labs Lab 04/05/13 1533 04/06/13 0500  NA 134* 137  K 4.4 3.8  CL 96 106  CO2 23 23  GLUCOSE 97 106*  BUN 26* 21  CREATININE 0.82 0.76  CALCIUM 10.1 8.5   Liver Function Tests: No results found for this basename: AST, ALT, ALKPHOS, BILITOT, PROT, ALBUMIN,  in the last 168 hours No results found for this basename: LIPASE, AMYLASE,  in the last 168 hours No results found for this basename: AMMONIA,  in the last 168 hours CBC:  Recent Labs Lab 04/05/13 1542 04/06/13 0500  WBC 9.3 8.0  HGB 12.8 10.0*  HCT 36.8 28.7*  MCV 87.8 89.1  PLT 367 285   Cardiac Enzymes:  Recent Labs Lab 04/05/13 1749  TROPONINI <0.30   BNP: BNP (last 3 results)  Recent Labs  06/15/12 1720 06/17/12 0455 07/20/12 0600  PROBNP 6932.0* 1930.0* 891.1*   CBG:  Recent Labs Lab 04/05/13 2200 04/06/13 0615  GLUCAP 74 102*       Signed:  Hassell Patras, J  [facility] 04/06/2013, 9:48 AM

## 2013-04-06 NOTE — Progress Notes (Signed)
Echo Lab  2D Echocardiogram completed.  Margaret Hess L [name], RDCS 04/06/2013 1:48 PM

## 2013-04-06 NOTE — Progress Notes (Signed)
Pt requesting to have Rx order for PT,OT and ST which she usually attend on Thursdays.  Dr. Joseph Art made aware of this.  Asked to have SW look into it.  Lupita Leash SW notified.  Amanda Pea, Charity fundraiser.

## 2013-04-06 NOTE — Progress Notes (Signed)
Utilization review completed.  P.J. Alekhya Gravlin,RN,BSN Case Manager [phone]  

## 2013-04-07 LAB — URINE CULTURE: Culture: NO GROWTH

## 2013-04-12 LAB — CULTURE, BLOOD (ROUTINE X 2)

## 2013-04-15 ENCOUNTER — Ambulatory Visit (HOSPITAL_COMMUNITY)
Admission: RE | Admit: 2013-04-15 | Discharge: 2013-04-15 | Disposition: A | Discharge status: Home / Self Care | Payer: BC Managed Care – PPO | Source: Ambulatory Visit | Attending: Internal Medicine | Admitting: Internal Medicine

## 2013-04-15 ENCOUNTER — Ambulatory Visit (HOSPITAL_BASED_OUTPATIENT_CLINIC_OR_DEPARTMENT_OTHER)
Admission: RE | Admit: 2013-04-15 | Discharge: 2013-04-15 | Disposition: A | Discharge status: Home / Self Care | Payer: BC Managed Care – PPO | Source: Ambulatory Visit | Attending: Internal Medicine | Admitting: Internal Medicine

## 2013-04-15 ENCOUNTER — Encounter (HOSPITAL_COMMUNITY): Payer: Self-pay

## 2013-04-15 VITALS — BP 110/78 | HR 104 | Wt 128.1 lb

## 2013-04-15 DIAGNOSIS — I5022 Chronic systolic (congestive) heart failure: Secondary | ICD-10-CM

## 2013-04-15 DIAGNOSIS — I501 Left ventricular failure: Secondary | ICD-10-CM | POA: Insufficient documentation

## 2013-04-15 DIAGNOSIS — R0989 Other specified symptoms and signs involving the circulatory and respiratory systems: Secondary | ICD-10-CM | POA: Insufficient documentation

## 2013-04-15 DIAGNOSIS — I509 Heart failure, unspecified: Secondary | ICD-10-CM | POA: Insufficient documentation

## 2013-04-15 DIAGNOSIS — R0609 Other forms of dyspnea: Secondary | ICD-10-CM | POA: Insufficient documentation

## 2013-04-15 DIAGNOSIS — I6340 Cerebral infarction due to embolism of unspecified cerebral artery: Secondary | ICD-10-CM

## 2013-04-15 DIAGNOSIS — E119 Type 2 diabetes mellitus without complications: Secondary | ICD-10-CM | POA: Insufficient documentation

## 2013-04-15 DIAGNOSIS — R002 Palpitations: Secondary | ICD-10-CM | POA: Insufficient documentation

## 2013-04-15 DIAGNOSIS — I639 Cerebral infarction, unspecified: Secondary | ICD-10-CM

## 2013-04-15 DIAGNOSIS — I5021 Acute systolic (congestive) heart failure: Secondary | ICD-10-CM

## 2013-04-15 DIAGNOSIS — I10 Essential (primary) hypertension: Secondary | ICD-10-CM | POA: Insufficient documentation

## 2013-04-15 DIAGNOSIS — I428 Other cardiomyopathies: Secondary | ICD-10-CM | POA: Insufficient documentation

## 2013-04-15 DIAGNOSIS — Z86718 Personal history of other venous thrombosis and embolism: Secondary | ICD-10-CM | POA: Insufficient documentation

## 2013-04-15 DIAGNOSIS — I69920 Aphasia following unspecified cerebrovascular disease: Secondary | ICD-10-CM | POA: Insufficient documentation

## 2013-04-15 DIAGNOSIS — Z8673 Personal history of transient ischemic attack (TIA), and cerebral infarction without residual deficits: Secondary | ICD-10-CM | POA: Insufficient documentation

## 2013-04-15 MED ORDER — CARVEDILOL 3.125 MG PO TABS: 3.1250 mg | ORAL_TABLET | Freq: Two times a day (BID) | ORAL | Status: DC

## 2013-04-15 NOTE — Patient Instructions (Addendum)
Start Carvedilol 3.125mg  twice a day. Your perscription has been sent to the pharmacy  Your physician recommends that you schedule a follow-up appointment in: 6 weeks  Do the following things EVERYDAY: 1) Weigh yourself in the morning before breakfast. Write it down and keep it in a log. 2) Take your medicines as prescribed 3) Eat low salt foods-Limit salt (sodium) to 2000 mg per day.  4) Stay as active as you can everyday 5) Limit all fluids for the day to less than 2 liters 6)

## 2013-04-16 ENCOUNTER — Other Ambulatory Visit (HOSPITAL_COMMUNITY): Payer: Self-pay | Admitting: Adult Health

## 2013-04-17 NOTE — Progress Notes (Signed)
Patient ID: Margaret Hess, female   DOB: 01-15-1966, 47 y.o.   MRN: [id number]  PCP: Dr. Piedad Climes  Neurologist: Dr Pearlean Brownie Pain: Dr Wynn Banker  HPI:  Margaret Hess is 47 yo woman with history of DM, HTN, NICM, EF <20%. 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   07/07/12 ECHO EF 20-25% grade II diastolic dysfunction  10/21/12 ECHO EF 35-40% grade 1 diastolic dysfunction 04/15/13 EF 50%  Last visit was admitted with hypotension (BP 70/50) in setting of volume depletion. All diuretics and HF meds stopped.   Follow up: Returns today for f/u. Feels so much better. Going to stroke rehab and can do anything she wants. Denies dyspnea, presyncope, anorexia or fatigue.  SBP > 100 cnosistently   ROS: All systems negative except as listed in HPI, PMH and Problem List.  Past Medical History  Diagnosis Date  . Migraines   . Hypertension   . Diabetes mellitus   . Stroke   . CHF (congestive heart failure)     Current Outpatient Prescriptions  Medication Sig Dispense Refill  . ALPRAZolam (XANAX) 0.5 MG tablet Take 0.5 mg by mouth at bedtime.      . citalopram (CELEXA) 20 MG tablet Take 40 mg by mouth every morning.       . DULoxetine (CYMBALTA) 60 MG capsule Take 60 mg by mouth daily.      Marland Kitchen gabapentin (NEURONTIN) 800 MG tablet Take 800-1,600 mg by mouth 3 (three) times daily. Takes 1 tablet in morning, 1 tablet at lunch, and 2 tablets at bedtime      . HYDROcodone-acetaminophen (NORCO/VICODIN) 5-325 MG per tablet Take 1 tablet by mouth 2 (two) times daily.       . metFORMIN (GLUCOPHAGE) 500 MG tablet Take 500 mg by mouth 3 (three) times daily with meals.      . warfarin (COUMADIN) 2.5 MG tablet Take 5 mg by mouth daily.      Marland Kitchen acetaminophen (TYLENOL) 500 MG tablet Take 1,000 mg by mouth every 6 (six) hours as needed for pain.       . carvedilol (COREG) 3.125 MG tablet Take 1 tablet (3.125 mg total) by mouth 2 (two) times daily with a meal.  180 tablet  3  . chlorzoxazone  (PARAFON) 500 MG tablet Take 500 mg by mouth 3 (three) times daily as needed for muscle spasms.      . insulin detemir (LEVEMIR) 100 UNIT/ML injection Inject 10 Units into the skin at bedtime.       . pantoprazole (PROTONIX) 40 MG tablet Take 40 mg by mouth every morning.       . zolpidem (AMBIEN) 5 MG tablet Take 5 mg by mouth at bedtime as needed for sleep.       No current facility-administered medications for this encounter.    PHYSICAL EXAM: Filed Vitals:   04/15/13 1553  BP: 110/78  Pulse: 104  Weight: 128 lb 1.6 oz (58.106 kg)  SpO2: 99%   General:  Well appearing. No resp difficulty. Caregiver present.  HEENT: normal Neck: supple. JVP flat. Carotids 2+ bilaterally; no bruits. No lymphadenopathy or thryomegaly appreciated. Cor: PMI normal. Irregular rate & rhythm. No rubs, gallops or murmurs. Lungs: clear Abdomen: soft, nontender, nondistended. No hepatosplenomegaly. No bruits or masses. Good bowel sounds. Extremities: no cyanosis, clubbing, rash, edema. LLE brace in place. LUE flaccid Neuro: alert. Communicative but signifocant expressive aphasia persists. cranial nerves grossly intact. L-sided weakness. Affect pleasant.   ASSESSMENT &  PLAN:  1) Chronic systolic HF;  --EF much improved on echo today. 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 -- will restart carvedilol 3.125 bid -- avoid ACE and diuretics due to severe hypotension  2) CVA, with residual expressive aphasia and L-sided weakness - continue rehab   Reuel Boom Bensimhon,MD 5:19 PM

## 2013-04-21 ENCOUNTER — Ambulatory Visit (INDEPENDENT_AMBULATORY_CARE_PROVIDER_SITE_OTHER): Payer: BC Managed Care – PPO | Admitting: Nurse Practitioner

## 2013-04-21 ENCOUNTER — Encounter: Payer: Self-pay | Admitting: Nurse Practitioner

## 2013-04-21 ENCOUNTER — Encounter (HOSPITAL_COMMUNITY): Payer: BC Managed Care – PPO

## 2013-04-21 VITALS — BP 108/72 | HR 96 | Temp 98.1°F | Ht 65.0 in | Wt 123.0 lb

## 2013-04-21 DIAGNOSIS — I639 Cerebral infarction, unspecified: Secondary | ICD-10-CM

## 2013-04-21 DIAGNOSIS — I428 Other cardiomyopathies: Secondary | ICD-10-CM

## 2013-04-21 DIAGNOSIS — G8110 Spastic hemiplegia affecting unspecified side: Secondary | ICD-10-CM

## 2013-04-21 DIAGNOSIS — I6350 Cerebral infarction due to unspecified occlusion or stenosis of unspecified cerebral artery: Secondary | ICD-10-CM

## 2013-04-21 DIAGNOSIS — I5189 Other ill-defined heart diseases: Secondary | ICD-10-CM

## 2013-04-21 DIAGNOSIS — I240 Acute coronary thrombosis not resulting in myocardial infarction: Secondary | ICD-10-CM

## 2013-04-21 DIAGNOSIS — F801 Expressive language disorder: Secondary | ICD-10-CM

## 2013-04-21 DIAGNOSIS — E119 Type 2 diabetes mellitus without complications: Secondary | ICD-10-CM

## 2013-04-21 NOTE — Patient Instructions (Signed)
Continue warfarin  for secondary stroke prevention and maintain strict control of hypertension with blood pressure goal below 130/90, diabetes with hemoglobin A1c goal below 6.5% and lipids with LDL cholesterol goal below 100 mg/dL.   Followup in the future with me in 6 months.

## 2013-04-21 NOTE — Progress Notes (Signed)
[facility]  PATIENT: Margaret Hess DOB: [birth year]   HISTORY FROM: patient, chart REASON FOR VISIT: routine follow up  HISTORY OF PRESENT ILLNESS:  11/03/12 PRIOR HP (PS): Ms Wareing is a 27 year  Caucasian lady who was admitted with new diagnosed congestive heart failure on 06/15/12 and found to have left ventricular mural thrombus and started on anticoagulation .On 06/19/12 she was readmitted with sudden onset of left-sided weakness and difficulty speaking. She was not a candidate for TPA as she was on anticoagulation with warfarin and INR was greater than 1.7. CT of the head was unremarkable but subsequent MRI scan demonstrated her an acute infarct in the right middle cerebral artery territory with mild mass effect and midline shift. MRA showed an acute occlusion of the right M1 segment of the middle cerebral artery. Transthoracic echo on 06/16/2012 showed ejection fraction of 20% with severe global hypokinesis with a non-mobile calcified organized thrombus in the left ventricular apex. Carotid Doppler showed no significant extracranial stenosis. Urine drug screen was negative. Hemoglobin A1c was 7.0. LDL cholesterol was 64 and total cholesterol was 121 mg percent. She was continued on warfarin   and was transferred initially to inpatient rehabilitation where she stayed for a month and subsequently went to a nursing home for 2 months. She has now been living at her home in [place] [facility] [place] with her mother. She is able to ambulate with a 4 pronged cane and uses a wheelchair only for long distances. Her speech has improved but she still has word hesitancy and word finding difficulties and cannot speak fluently. She has not gotten any improvement in the left upper extremity strength. She has a persistent left foot drop and wears a AFO. She has finished home therapies but outpatient therapies have not been set up. She complains of pain in the left arm as well as leg and is currently taking  gabapentin 600 mg 3 times a day. She states her sugars are under good control and she is tolerating warfarin without bleeding, bruising or other side effects. She has not seen Dr. Jodean Lima for the last several months. she has been approved for Medicaid.  UPDATE 04/21/13 (LL):  Has had significant pain with left shoulder, Dr. Wynn Banker injected left glenohumeral in June with very good results.  Recent dizziness, nausea assocaited with low BP, lisinipril, carvedilol, spironolactone, and lasix were stopped by cards.  Had to go to ER 2 weeks ago for rehydration with IV fluids. Feeling much better now.  Getting outpatient ST/OT/PT, has had good progress with left arm and hand splint, wearing daily.  Has had around 30 lb. total weight loss, does not need insulin now, still on orals. Continues on Warfarin therapy, goal INR is 3.0.  Patient denies medication side effects, with no signs of bleeding or excessive bruising.  Functional capacity is much improved.  She is able to live independently and has a part-time care giver for help with cleaning and preparing some meals.  She is able to dress and bathe herself.  She is hoping to be able to drive again; is interested in enrolling in a driving class for stroke patients through [facility].  REVIEW OF SYSTEMS: Full 14 system review of systems performed and notable only for: constitutional: N/A  cardiovascular: N/A respiratory: N/A endocrine: N/A  ear/nose/throat: N/A  Hematology/Lymph: easy bleeding, easy bruising musculoskeletal: N/A skin: N/A genitourinary: N/A Gastrointestinal: N/A allergy/immunology: N/A neurological: N/A sleep: N/A psychiatric: N/A   ALLERGIES: No Known Allergies  HOME MEDICATIONS: Outpatient Prescriptions Prior to Visit  Medication Sig Dispense Refill  . acetaminophen (TYLENOL) 500 MG tablet Take 1,000 mg by mouth every 6 (six) hours as needed for pain.       Marland Kitchen ALPRAZolam (XANAX) 0.5 MG tablet Take 0.5 mg by mouth  at bedtime.      . carvedilol (COREG) 3.125 MG tablet Take 1 tablet (3.125 mg total) by mouth 2 (two) times daily with a meal.  180 tablet  3  . chlorzoxazone (PARAFON) 500 MG tablet Take 500 mg by mouth 3 (three) times daily as needed for muscle spasms.      . citalopram (CELEXA) 20 MG tablet Take 40 mg by mouth every morning.       . DULoxetine (CYMBALTA) 60 MG capsule Take 60 mg by mouth daily.      Marland Kitchen gabapentin (NEURONTIN) 800 MG tablet Take 800-1,600 mg by mouth 3 (three) times daily. Takes 1 tablet in morning, 1 tablet at lunch, and 2 tablets at bedtime      . HYDROcodone-acetaminophen (NORCO/VICODIN) 5-325 MG per tablet Take 1 tablet by mouth 2 (two) times daily.       . metFORMIN (GLUCOPHAGE) 500 MG tablet Take 500 mg by mouth 3 (three) times daily with meals.      . pantoprazole (PROTONIX) 40 MG tablet Take 40 mg by mouth every morning.       . warfarin (COUMADIN) 2.5 MG tablet Take 5 mg by mouth daily.      Marland Kitchen zolpidem (AMBIEN) 5 MG tablet Take 5 mg by mouth at bedtime as needed for sleep.      . insulin detemir (LEVEMIR) 100 UNIT/ML injection Inject 10 Units into the skin at bedtime.        No facility-administered medications prior to visit.    PAST MEDICAL HISTORY: Past Medical History  Diagnosis Date  . Migraines   . Hypertension   . Diabetes mellitus   . Stroke   . CHF (congestive heart failure)     PAST SURGICAL HISTORY: Past Surgical History  Procedure Laterality Date  . Wisdom tooth extraction      FAMILY HISTORY: Family History  Problem Relation Age of Onset  . Multiple myeloma Mother   . Heart disease Father   . Hypertension Father     SOCIAL HISTORY: History   Social History  . Marital Status: Single    Spouse Name: N/A    Number of Children: N/A  . Years of Education: N/A   Occupational History  . Not on file.   Social History Main Topics  . Smoking status: Never Smoker   . Smokeless tobacco: Never Used  . Alcohol Use: Yes     Comment: 1  mixed drink every 6 months  . Drug Use: No  . Sexual Activity: Yes    Birth Control/ Protection: Inserts   Other Topics Concern  . Not on file   Social History Narrative  . No narrative on file     PHYSICAL EXAM  Filed Vitals:   04/21/13 1446  BP: 108/72  Pulse: 96  Temp: 98.1 F (36.7 C)  TempSrc: Oral  Height: 5\' 5"  (1.651 m)  Weight: 123 lb (55.792 kg)   Body mass index is 20.47 kg/(m^2).  Physical Exam  General: Pleasant young Caucasian lady, in no distress.  Afebrile.   Head: nontraumatic Ears, Nose and Throat: Hearing is normal.  Neck: supple without bruit Respiratory: clear to auscultation Cardiovascular: no murmur or gallop Musculoskeletal:  left foot drop wears a AFO Skin: no rash  Neurologic Exam  Mental Status: Awake, alert and oriented to time, place and person.  Nonfluent speech with word hesitancy but no dysarthria.  Cranial Nerves: Eye movements are full range without nystagmus.  Visual fields are full to confrontational testing.  Face is asymmetric with left lower face weakness.  Tongue is midline. Hearing is normal. Motor: reveals  intense left upper extremity monoplegia with 0/5  strength and diminished tone. Tone is increased in the left lower extremity with 4/5 strength at the hip and knees with left ankle foot drop and 0/5 strength in ankle dorsiflexors. Sensory: Touch and pinprick sensations are  Diminished on the left Coordination:  impaired on the left Gait and Station:  spastic hemiplegic gait and uses her 4 pronged cane. Reflexes: Deep tendon reflexes are 2+ asymmetric brisker on theleft.  Plantars are downgoing.    DIAGNOSTIC DATA (LABS, IMAGING, TESTING) - I reviewed patient records, labs, notes, testing and imaging myself where available.  Lab Results  Component Value Date   WBC 8.0 04/06/2013   HGB 10.0* 04/06/2013   HCT 28.7* 04/06/2013   MCV 89.1 04/06/2013   PLT 285 04/06/2013      Component Value Date/Time   NA 138 04/06/2013 1132   K  4.1 04/06/2013 1132   CL 104 04/06/2013 1132   CO2 24 04/06/2013 1132   GLUCOSE 96 04/06/2013 1132   BUN 20 04/06/2013 1132   CREATININE 0.72 04/06/2013 1132   CALCIUM 8.8 04/06/2013 1132   PROT 6.2 04/06/2013 1132   ALBUMIN 3.1* 04/06/2013 1132   AST 12 04/06/2013 1132   ALT 10 04/06/2013 1132   ALKPHOS 27* 04/06/2013 1132   BILITOT 0.2* 04/06/2013 1132   GFRNONAA >90 04/06/2013 1132   GFRAA >90 04/06/2013 1132   Lab Results  Component Value Date   CHOL 121 06/17/2012   HDL 23* 06/17/2012   LDLCALC 64 06/17/2012   TRIG 169* 06/17/2012   CHOLHDL 5.3 06/17/2012   Lab Results  Component Value Date   HGBA1C 7.0* 06/17/2012   Lab Results  Component Value Date   VITAMINB12 972* 06/17/2012   Lab Results  Component Value Date   TSH 0.865 06/15/2012    ASSESSMENT AND PLAN 16 year Caucasian lady with right middle cerebral artery branch infarct in October 2 013 from cardiogenic embolism with left ventricular mural thrombus. She has residual significant disability from mild expressive speech difficulties and spastic left hemiplegia. Vascular risk factors of cardiomyopathy with low ejection fraction, (recently has improved to an EF of 50%), Diabetes and left ventricular mural thrombus. She also has post stroke pain in the left upper and lower extremity; relief with gabapentin and hydrocodone.  She sees pain MD in [facility].  Mood much improved now that pain is under control and some functional improvement has been achieved with PT.  Continue warfarin  for secondary stroke prevention and maintain strict control of hypertension with blood pressure goal below 130/90, diabetes with hemoglobin A1c goal below 6.5% and lipids with LDL cholesterol goal below 100 mg/dL.  Followup in 6 months.  Rheana Casebolt NP-C 04/21/2013, 3:40 PM  [facility] [address] [place], [place] [address] [phone]

## 2013-04-22 ENCOUNTER — Ambulatory Visit: Payer: Self-pay | Admitting: Nurse Practitioner

## 2013-04-30 ENCOUNTER — Ambulatory Visit: Payer: BC Managed Care – PPO | Admitting: Physical Medicine & Rehabilitation

## 2013-05-21 ENCOUNTER — Encounter: Payer: BLUE CROSS/BLUE SHIELD | Attending: Physical Medicine & Rehabilitation

## 2013-05-21 ENCOUNTER — Ambulatory Visit: Payer: BC Managed Care – PPO | Admitting: Physical Medicine & Rehabilitation

## 2013-05-21 DIAGNOSIS — G8110 Spastic hemiplegia affecting unspecified side: Secondary | ICD-10-CM | POA: Insufficient documentation

## 2013-05-27 ENCOUNTER — Encounter (HOSPITAL_COMMUNITY): Payer: BC Managed Care – PPO

## 2013-05-31 ENCOUNTER — Ambulatory Visit: Payer: BC Managed Care – PPO | Admitting: Physical Medicine & Rehabilitation

## 2013-06-02 ENCOUNTER — Encounter (HOSPITAL_COMMUNITY): Payer: Self-pay

## 2013-06-02 ENCOUNTER — Ambulatory Visit (HOSPITAL_COMMUNITY)
Admission: RE | Admit: 2013-06-02 | Discharge: 2013-06-02 | Disposition: A | Discharge status: Home / Self Care | Payer: BC Managed Care – PPO | Source: Ambulatory Visit | Attending: Internal Medicine | Admitting: Internal Medicine

## 2013-06-02 VITALS — BP 110/62 | HR 86 | Wt 126.8 lb

## 2013-06-02 DIAGNOSIS — I5022 Chronic systolic (congestive) heart failure: Secondary | ICD-10-CM | POA: Insufficient documentation

## 2013-06-02 DIAGNOSIS — I6350 Cerebral infarction due to unspecified occlusion or stenosis of unspecified cerebral artery: Secondary | ICD-10-CM | POA: Insufficient documentation

## 2013-06-02 DIAGNOSIS — I639 Cerebral infarction, unspecified: Secondary | ICD-10-CM

## 2013-06-02 NOTE — Patient Instructions (Addendum)
Follow up in 2-3 months

## 2013-06-02 NOTE — Progress Notes (Signed)
Patient ID: Margaret Hess, female   DOB: 07/10/1966, 47 y.o.   MRN: [id number] PCP: Dr. Piedad Climes  Neurologist: Dr Pearlean Brownie Pain: Dr Wynn Banker  HPI: Margaret Hess is 47 yo woman with history of DM, HTN, NICM, EF <20%. 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   07/07/12 ECHO EF 20-25% grade II diastolic dysfunction  10/21/12 ECHO EF 35-40% grade 1 diastolic dysfunction 04/15/13 EF 50%  She returns for follow up. Last visit carvedilol restarted at 3.125 mg bid. She will remain off diuretics and ace inhibitors due to hypotension.  Denies SOB/PND/Orthopnea. Denies presyncope.  Taking all medications.   ROS: All systems negative except as listed in HPI, PMH and Problem List.  Past Medical History  Diagnosis Date  . Migraines   . Hypertension   . Diabetes mellitus   . Stroke   . CHF (congestive heart failure)     Current Outpatient Prescriptions  Medication Sig Dispense Refill  . acetaminophen (TYLENOL) 500 MG tablet Take 1,000 mg by mouth every 6 (six) hours as needed for pain.       Marland Kitchen ALPRAZolam (XANAX) 0.5 MG tablet Take 0.5 mg by mouth at bedtime.      . carvedilol (COREG) 3.125 MG tablet Take 1 tablet (3.125 mg total) by mouth 2 (two) times daily with a meal.  180 tablet  3  . chlorzoxazone (PARAFON) 500 MG tablet Take 500 mg by mouth 3 (three) times daily as needed for muscle spasms.      . citalopram (CELEXA) 20 MG tablet Take 40 mg by mouth every morning.       . DULoxetine (CYMBALTA) 60 MG capsule Take 60 mg by mouth daily.      Marland Kitchen gabapentin (NEURONTIN) 800 MG tablet Take 800-1,600 mg by mouth 3 (three) times daily. Takes 1 tablet in morning, 1 tablet at lunch, and 2 tablets at bedtime      . HYDROcodone-acetaminophen (NORCO/VICODIN) 5-325 MG per tablet Take 1 tablet by mouth 2 (two) times daily.       Marland Kitchen lisinopril (PRINIVIL,ZESTRIL) 5 MG tablet       . metFORMIN (GLUCOPHAGE) 500 MG tablet Take 500 mg by mouth 3 (three) times daily with meals.      . ondansetron  (ZOFRAN) 4 MG tablet       . ONE TOUCH ULTRA TEST test strip       . pantoprazole (PROTONIX) 40 MG tablet Take 40 mg by mouth every morning.       Marland Kitchen spironolactone (ALDACTONE) 25 MG tablet Take 25 mg by mouth.       . warfarin (COUMADIN) 2.5 MG tablet Take 5 mg by mouth daily.       No current facility-administered medications for this encounter.    PHYSICAL EXAM: Filed Vitals:   06/02/13 0959  BP: 110/62  Pulse: 86  Weight: 126 lb 12.8 oz (57.516 kg)  SpO2: 100%   General:  Well appearing. No resp difficulty. Caregiver present.  HEENT: normal Neck: supple. JVP flat. Carotids 2+ bilaterally; no bruits. No lymphadenopathy or thryomegaly appreciated. Cor: PMI normal. Irregular rate & rhythm. No rubs, gallops or murmurs. Lungs: clear Abdomen: soft, nontender, nondistended. No hepatosplenomegaly. No bruits or masses. Good bowel sounds. Extremities: no cyanosis, clubbing, rash, edema. LLE brace in place. LUE flaccid Neuro: alert. Communicative but signifocant expressive aphasia persists. cranial nerves grossly intact. L-sided weakness. Affect pleasant.   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 ECHO 04/15/13 EF 50%  -- Continue carvedilol 3.125 bid -- avoid ACE and diuretics due to severe hypotension  2) CVA, with residual expressive aphasia and L-sided weakness - continue rehab. Remains on coumadin   Follow up in 2-3 months   Romulo Okray,NP-C 10:05 AM

## 2013-06-18 ENCOUNTER — Encounter: Payer: BC Managed Care – PPO | Attending: Physical Medicine & Rehabilitation

## 2013-06-18 ENCOUNTER — Encounter: Payer: Self-pay | Admitting: Physical Medicine & Rehabilitation

## 2013-06-18 ENCOUNTER — Ambulatory Visit (HOSPITAL_BASED_OUTPATIENT_CLINIC_OR_DEPARTMENT_OTHER): Payer: BC Managed Care – PPO | Admitting: Physical Medicine & Rehabilitation

## 2013-06-18 VITALS — BP 114/70 | HR 85 | Resp 14 | Ht 66.0 in | Wt 126.2 lb

## 2013-06-18 DIAGNOSIS — G8110 Spastic hemiplegia affecting unspecified side: Secondary | ICD-10-CM

## 2013-06-18 NOTE — Progress Notes (Signed)
Botox Injection for spasticity using needle EMG guidance  Dilution: 50 Units/ml Indication: Severe spasticity which interferes with ADL,mobility and/or  hygiene and is unresponsive to medication management and other conservative care Informed consent was obtained after describing risks and benefits of the procedure with the patient. This includes bleeding, bruising, infection, excessive weakness, or medication side effects. A REMS form is on file and signed. Needle: 25 gauge 1 inch needle electrode Number of units per muscle LEFT Brachioradialis 50  FCR50  FDS25  FPL25 EHL 50 All injections were done after obtaining appropriate EMG activity and after negative drawback for blood. The patient tolerated the procedure well. Post procedure instructions were given. A followup appointment was made.  Consider left tibial nerve block next visit

## 2013-06-18 NOTE — Patient Instructions (Signed)
You received a Botox injection today. You may experience soreness at the needle injection sites. Please call us if any of the injection sites turns red after a couple days or if there is any drainage. You may experience muscle weakness as a result of Botox. This would improve with time but can take several weeks to improve. The Botox should start working in about one week. The Botox usually last 3 months. The injection can be repeated every 3 months as needed.  Assess for left tibial nerve block at next visit

## 2013-07-08 ENCOUNTER — Other Ambulatory Visit: Payer: Self-pay

## 2013-07-23 ENCOUNTER — Encounter: Payer: BC Managed Care – PPO | Attending: Physical Medicine & Rehabilitation

## 2013-07-23 ENCOUNTER — Encounter: Payer: Self-pay | Admitting: Physical Medicine & Rehabilitation

## 2013-07-23 ENCOUNTER — Ambulatory Visit (HOSPITAL_BASED_OUTPATIENT_CLINIC_OR_DEPARTMENT_OTHER): Payer: BC Managed Care – PPO | Admitting: Physical Medicine & Rehabilitation

## 2013-07-23 VITALS — BP 88/60 | HR 98 | Resp 14 | Ht 65.0 in | Wt 125.0 lb

## 2013-07-23 DIAGNOSIS — G8110 Spastic hemiplegia affecting unspecified side: Secondary | ICD-10-CM | POA: Insufficient documentation

## 2013-07-23 NOTE — Progress Notes (Signed)
  Subjective:    Patient ID: Margaret Hess, female    DOB: Jan 28, 1966, 47 y.o.   MRN: [id number]  HPI  Pain Inventory Average Pain 4 Pain Right Now 4 My pain is intermittent, dull and aching  In the last 24 hours, has pain interfered with the following? General activity 4 Relation with others 4 Enjoyment of life 4 What TIME of day is your pain at its worst? morning and night Sleep (in general) Good  Pain is worse with: inactivity Pain improves with: heat/ice, therapy/exercise and medication Relief from Meds: 4  Mobility Do you have any goals in this area?  no  Function Do you have any goals in this area?  no  Neuro/Psych depression anxiety  Prior Studies Any changes since last visit?  no  Physicians involved in your care Any changes since last visit?  no   Family History  Problem Relation Age of Onset  . Multiple myeloma Mother   . Heart disease Father   . Hypertension Father    History   Social History  . Marital Status: Single    Spouse Name: N/A    Number of Children: N/A  . Years of Education: N/A   Social History Main Topics  . Smoking status: Never Smoker   . Smokeless tobacco: Never Used  . Alcohol Use: Yes     Comment: 1 mixed drink every 6 months  . Drug Use: No  . Sexual Activity: Yes    Birth Control/ Protection: Inserts   Other Topics Concern  . None   Social History Narrative  . None   Past Surgical History  Procedure Laterality Date  . Wisdom tooth extraction     Past Medical History  Diagnosis Date  . Migraines   . Hypertension   . Diabetes mellitus   . Stroke   . CHF (congestive heart failure)    BP 88/60  Pulse 98  Resp 14  Ht 5\' 5"  (1.651 m)  Wt 125 lb (56.7 kg)  BMI 20.80 kg/m2  SpO2 100%     Review of Systems  Musculoskeletal: Positive for back pain and gait problem.  Psychiatric/Behavioral: Positive for dysphoric mood. The patient is nervous/anxious.   All other systems reviewed and are  negative.       Objective:   Physical Exam        Assessment & Plan:  Phenol neurolysis of the left tibial nerve  Indication: Severe spasticity in the plantar flexor muscles which is not responding to medical management and other conservative care and interfering with functional use.  Informed consent was obtained after describing the risks and benefits of the procedure with the patient this includes bleeding bruising and infection as well as medication side effects. The patient elected to proceed and has given written consent. Patient placed in a prone position on the exam table. External DC stimulation was applied to the popliteal space using a nerve stimulator. Plantar flexion twitch was obtained. The popliteal region was prepped with Betadine and then entered with a 22-gauge 40 mm needle electrode under electrical stimulation guidance. Plantar flexion which was obtained and confirmed. Then 4 cc of 5% phenol were injected. The patient tolerated procedure well. Post procedure instructions and followup visit were given.

## 2013-07-23 NOTE — Patient Instructions (Addendum)
Left tibial nerve block with phenol This will take up to 2 weeks to take full effect I will see you in one month  No restrictions on activity Please call my office if you develop redness or drainage from the injection site  behind the knee  You may feel some numbness or burning pain on the bottom of your foot

## 2013-08-03 ENCOUNTER — Other Ambulatory Visit (HOSPITAL_COMMUNITY): Payer: Self-pay | Admitting: Adult Health

## 2013-08-31 ENCOUNTER — Ambulatory Visit (HOSPITAL_BASED_OUTPATIENT_CLINIC_OR_DEPARTMENT_OTHER): Payer: BC Managed Care – PPO | Admitting: Physical Medicine & Rehabilitation

## 2013-08-31 ENCOUNTER — Encounter: Payer: Self-pay | Admitting: Physical Medicine & Rehabilitation

## 2013-08-31 ENCOUNTER — Encounter: Payer: BC Managed Care – PPO | Attending: Physical Medicine & Rehabilitation

## 2013-08-31 VITALS — BP 119/73 | HR 84 | Resp 14 | Ht 65.0 in | Wt 129.8 lb

## 2013-08-31 DIAGNOSIS — G8110 Spastic hemiplegia affecting unspecified side: Secondary | ICD-10-CM | POA: Insufficient documentation

## 2013-08-31 NOTE — Patient Instructions (Signed)
Will do Botox next visit  Will  Get driving letter next visit after discussion with PT/OT

## 2013-08-31 NOTE — Progress Notes (Signed)
Subjective:    Patient ID: Margaret Hess, female    DOB: 06/14/1966, 47 y.o.   MRN: [id number]  HPI  Patient aphasic but communicates that she did not feel the tibial nerve block has been very helpful.  Patient complains of her left foot turning in" and occ dragging as well as her toes curling. Left elbow stays bent and hand/ fingers are curling up Pain Inventory Average Pain 3 Pain Right Now 1 My pain is constant, dull and aching  In the last 24 hours, has pain interfered with the following? General activity 4 Relation with others 3 Enjoyment of life 5 What TIME of day is your pain at its worst? evening Sleep (in general) Good  Pain is worse with: sitting, inactivity and some activites Pain improves with: therapy/exercise and medication Relief from Meds: 2  Mobility walk with assistance use a cane Do you have any goals in this area?  no  Function Do you have any goals in this area?  no  Neuro/Psych confusion depression  Prior Studies Any changes since last visit?  no  Physicians involved in your care Any changes since last visit?  no   Family History  Problem Relation Age of Onset  . Multiple myeloma Mother   . Heart disease Father   . Hypertension Father    History   Social History  . Marital Status: Single    Spouse Name: N/A    Number of Children: N/A  . Years of Education: N/A   Social History Main Topics  . Smoking status: Never Smoker   . Smokeless tobacco: Never Used  . Alcohol Use: Yes     Comment: 1 mixed drink every 6 months  . Drug Use: No  . Sexual Activity: Yes    Birth Control/ Protection: Inserts   Other Topics Concern  . None   Social History Narrative  . None   Past Surgical History  Procedure Laterality Date  . Wisdom tooth extraction     Past Medical History  Diagnosis Date  . Migraines   . Hypertension   . Diabetes mellitus   . Stroke   . CHF (congestive heart failure)    BP 119/73  Pulse 84  Resp 14   Ht 5\' 5"  (1.651 m)  Wt 129 lb 12.8 oz (58.877 kg)  BMI 21.60 kg/m2  SpO2 100%      Review of Systems  Psychiatric/Behavioral: Positive for confusion and dysphoric mood.  All other systems reviewed and are negative.       Objective:   Physical Exam  Modified Ashworth score 3 in the finger and wrist flexors left side Modified Ashworth score 2 in the plantar flexors and inverters on the left side  Motor strength is 0/5 in the left foot and ankle 4/5 left quadricep and hip flexors 3 minus left deltoid 0 in the left tricep and finger extensors, inhibited by excessive flexor tone      Assessment & Plan:  1. 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 Left hand and wrist flexor spasticity  50 units in the left FCR 50 units in the left FDS 50 units in the left FDP 25 units in the left FPL  3. Left elbow flexor spasticity, options are must kilo cutaneous nerve block vs. 100 units in the left biceps, we'll discuss further at next visit If patient  in next to have muscular cutaneous nerve block rather than bicep Botox. We can use 100 units of Botox in the flexor digitorum longus as well as flexor digitorum brevis LLE  Total 400 units next visit with EMG guidance  Also spoke with occupational therapy for the patient. Patient would like to drive. Discussed pros and cons. OT states that the patient has made excellent progress and may do well with driving. Patient has a friend who is a Emergency planning/management officer that may be able to screen her and if she does well with this may be able to go to [facility] for further testing

## 2013-09-22 ENCOUNTER — Telehealth: Payer: Self-pay | Admitting: Physical Medicine & Rehabilitation

## 2013-09-22 NOTE — Telephone Encounter (Signed)
Patient is requesting a letter from Dr. Wynn Banker to drive.  Says OT/PT sent notes from her visits with them, and she was anticipating Dr. Wynn Banker writing the letter based on those records (scanned into the media tab).  Wants the letter before Monday, 09/27/13.

## 2013-09-23 ENCOUNTER — Encounter: Payer: Self-pay | Admitting: Physical Medicine & Rehabilitation

## 2013-09-23 NOTE — Telephone Encounter (Signed)
Done see print out

## 2013-09-24 NOTE — Telephone Encounter (Signed)
Patient aware driving letter is ready.

## 2013-09-28 ENCOUNTER — Ambulatory Visit (HOSPITAL_COMMUNITY)
Admission: RE | Admit: 2013-09-28 | Discharge: 2013-09-28 | Disposition: A | Discharge status: Home / Self Care | Payer: BC Managed Care – PPO | Source: Ambulatory Visit | Attending: Internal Medicine | Admitting: Internal Medicine

## 2013-09-28 VITALS — BP 109/67 | HR 82 | Resp 18 | Wt 128.5 lb

## 2013-09-28 DIAGNOSIS — I5022 Chronic systolic (congestive) heart failure: Secondary | ICD-10-CM | POA: Insufficient documentation

## 2013-09-28 DIAGNOSIS — I10 Essential (primary) hypertension: Secondary | ICD-10-CM | POA: Insufficient documentation

## 2013-09-28 DIAGNOSIS — Z79899 Other long term (current) drug therapy: Secondary | ICD-10-CM | POA: Insufficient documentation

## 2013-09-28 DIAGNOSIS — I509 Heart failure, unspecified: Secondary | ICD-10-CM | POA: Insufficient documentation

## 2013-09-28 DIAGNOSIS — I6340 Cerebral infarction due to embolism of unspecified cerebral artery: Secondary | ICD-10-CM

## 2013-09-28 DIAGNOSIS — I639 Cerebral infarction, unspecified: Secondary | ICD-10-CM

## 2013-09-28 DIAGNOSIS — I428 Other cardiomyopathies: Secondary | ICD-10-CM | POA: Insufficient documentation

## 2013-09-28 DIAGNOSIS — Z7901 Long term (current) use of anticoagulants: Secondary | ICD-10-CM | POA: Insufficient documentation

## 2013-09-28 DIAGNOSIS — R29898 Other symptoms and signs involving the musculoskeletal system: Secondary | ICD-10-CM | POA: Insufficient documentation

## 2013-09-28 DIAGNOSIS — E119 Type 2 diabetes mellitus without complications: Secondary | ICD-10-CM | POA: Insufficient documentation

## 2013-09-28 DIAGNOSIS — I69998 Other sequelae following unspecified cerebrovascular disease: Secondary | ICD-10-CM | POA: Insufficient documentation

## 2013-09-28 DIAGNOSIS — I69920 Aphasia following unspecified cerebrovascular disease: Secondary | ICD-10-CM | POA: Insufficient documentation

## 2013-09-28 LAB — BASIC METABOLIC PANEL
BUN: 19 mg/dL (ref 6–23)
CALCIUM: 9.1 mg/dL (ref 8.4–10.5)
CO2: 28 mEq/L (ref 19–32)
Chloride: 101 mEq/L (ref 96–112)
Creatinine, Ser: 0.61 mg/dL (ref 0.50–1.10)
GFR calc Af Amer: >90 mL/min (ref >90)
GLUCOSE: 122 mg/dL — AB (ref 70–99)
POTASSIUM: 4.1 meq/L (ref 3.7–5.3)
Sodium: 140 mEq/L (ref 137–147)

## 2013-09-28 NOTE — Progress Notes (Signed)
Patient ID: Margaret Hess, female   DOB: Oct 10, 1965, 47 y.o.   MRN: [id number]  PCP: Dr. Piedad Climes  Neurologist: Dr Pearlean Brownie Pain: Dr Wynn Banker  HPI: Margaret Hess is 48 yo woman with history of DM, HTN, NICM, EF <20%. 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   07/07/12 ECHO EF 20-25% grade II diastolic dysfunction  10/21/12 ECHO EF 35-40% grade 1 diastolic dysfunction 04/15/13 EF 50%  Follow up: Doing well. Denies SOB, orthopnea, CP or edema. Walking with cane. Takings medications as prescribed. Following low salt diet and drinking less than 2L a day.     ROS: All systems negative except as listed in HPI, PMH and Problem List.  Past Medical History  Diagnosis Date  . Migraines   . Hypertension   . Diabetes mellitus   . Stroke   . CHF (congestive heart failure)     Current Outpatient Prescriptions  Medication Sig Dispense Refill  . acetaminophen (TYLENOL) 500 MG tablet Take 1,000 mg by mouth every 6 (six) hours as needed for pain.       Marland Kitchen ALPRAZolam (XANAX) 0.5 MG tablet Take 0.5 mg by mouth at bedtime.      . betamethasone dipropionate (DIPROLENE) 0.05 % cream       . carvedilol (COREG) 3.125 MG tablet Take 1 tablet (3.125 mg total) by mouth 2 (two) times daily with a meal.  60 tablet  6  . chlorzoxazone (PARAFON) 500 MG tablet Take 500 mg by mouth 3 (three) times daily as needed for muscle spasms.      . citalopram (CELEXA) 20 MG tablet Take 40 mg by mouth every morning.       . clobetasol (TEMOVATE) 0.05 % external solution       . DULoxetine (CYMBALTA) 60 MG capsule Take 60 mg by mouth daily.      Marland Kitchen gabapentin (NEURONTIN) 800 MG tablet Take 800-1,600 mg by mouth 3 (three) times daily. Takes 1 tablet in morning, 1 tablet at lunch, and 2 tablets at bedtime      . HYDROcodone-acetaminophen (NORCO/VICODIN) 5-325 MG per tablet Take 1 tablet by mouth 2 (two) times daily.       Marland Kitchen lisinopril (PRINIVIL,ZESTRIL) 5 MG tablet       . metFORMIN (GLUCOPHAGE) 500 MG tablet  Take 500 mg by mouth 3 (three) times daily with meals.      . mupirocin cream (BACTROBAN) 2 %       . nitrofurantoin, macrocrystal-monohydrate, (MACROBID) 100 MG capsule       . ondansetron (ZOFRAN) 4 MG tablet       . ondansetron (ZOFRAN-ODT) 8 MG disintegrating tablet       . ONE TOUCH ULTRA TEST test strip       . pantoprazole (PROTONIX) 40 MG tablet Take 40 mg by mouth every morning.       . phenazopyridine (PYRIDIUM) 100 MG tablet       . spironolactone (ALDACTONE) 25 MG tablet Take 25 mg by mouth daily.       . tamsulosin (FLOMAX) 0.4 MG CAPS capsule       . warfarin (COUMADIN) 2.5 MG tablet Take 5 mg by mouth daily.       No current facility-administered medications for this encounter.     Filed Vitals:   09/28/13 1430  BP: 109/67  Pulse: 82  Resp: 18  Weight: 128 lb 8 oz (58.287 kg)  SpO2: 100%    PHYSICAL EXAM: General:  Well appearing. No resp difficulty. Caregiver present.  HEENT: normal Neck: supple. JVP flat. Carotids 2+ bilaterally; no bruits. No lymphadenopathy or thryomegaly appreciated. Cor: PMI normal. Irregular rate & rhythm. No rubs, gallops or murmurs. Lungs: clear Abdomen: soft, nontender, nondistended. No hepatosplenomegaly. No bruits or masses. Good bowel sounds. Extremities: no cyanosis, clubbing, rash, edema. LLE brace in place. LUE flaccid Neuro: alert. Communicative but signifocant expressive aphasia persists. cranial nerves grossly intact. L-sided weakness. Affect pleasant.   ASSESSMENT & PLAN:  1) Chronic systolic HF: NICM, ?Viral; EF 50%. NYHA I symptoms and volume status stable. She is not currently on lasix instructed to call if weight is increasing. She has not tolerated lasix in the past due to severe hypotension. - Continue coreg 3.125 mg BID, will not titrate with soft BP. - Continue low dose lisinopril 5 mg daily, has not tolerated up titration in the past. - Reinforced the need and importance of daily weights, a low sodium diet, and  fluid restriction (less than 2 L a day). Instructed to call the HF clinic if weight increases more than 3 lbs overnight or 5 lbs in a week.  - BMET today. 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  She has residual expressive aphasia and L-sided weakness.  Continues to improve. Continue OT. Would like to be off Coumadin, however Dr. Shirlee LatchMcLean had lengthy discussion with patient about the risks of discontinuing. Would like to repeat ECHO in 6 months and if EF remains stable and remains that way for at least a year after can consider discussing stopping Coumadin.  3) HTN - Controlled on ACE-I and BB.   F/U 6 months with ECHO  Margaret Hess, Margaret Hess,Margaret Hess 2:39 PM  Patient seen with NP, agree with the above note.  Would stay on coumadin for the time being.  Followup in 6 months with echo.   Margaret Hess 09/29/2013

## 2013-09-28 NOTE — Patient Instructions (Signed)
Labs today.  Your physician recommends that you schedule a follow-up appointment in: 6 months with a ECHO

## 2013-10-21 ENCOUNTER — Ambulatory Visit (HOSPITAL_BASED_OUTPATIENT_CLINIC_OR_DEPARTMENT_OTHER): Payer: BC Managed Care – PPO | Admitting: Physical Medicine & Rehabilitation

## 2013-10-21 ENCOUNTER — Encounter: Payer: BC Managed Care – PPO | Attending: Physical Medicine & Rehabilitation

## 2013-10-21 ENCOUNTER — Encounter: Payer: Self-pay | Admitting: Physical Medicine & Rehabilitation

## 2013-10-21 VITALS — BP 109/70 | HR 89 | Resp 14 | Ht 65.0 in | Wt 129.0 lb

## 2013-10-21 DIAGNOSIS — G8110 Spastic hemiplegia affecting unspecified side: Secondary | ICD-10-CM

## 2013-10-21 NOTE — Patient Instructions (Signed)

## 2013-10-21 NOTE — Progress Notes (Signed)
Left upper and left lower extremity Botox injection for spasticity 342.12  Dilution: 50 Units/ml Indication: Severe spasticity which interferes with ADL,mobility and/or  hygiene and is unresponsive to medication management and other conservative care Informed consent was obtained after describing risks and benefits of the procedure with the patient. This includes bleeding, bruising, infection, excessive weakness, or medication side effects. A REMS form is on file and signed. Patient is on Coumadin. She had a ProTime at her primary care office 5 days ago and no change in dosage was recommended Needle: 27g 1 inch needle electrode Number of units per muscle  Upper  FCR50 FCU0 FDS50 FDP50 FPL25 Left biceps 100, (25x4)  Lower Post Tib 75 EHL 50 All injections were done after obtaining appropriate EMG activity and after negative drawback for blood. The patient tolerated the procedure well. Post procedure instructions were given. A followup appointment was made.

## 2013-10-22 ENCOUNTER — Ambulatory Visit: Payer: BC Managed Care – PPO | Admitting: Nurse Practitioner

## 2013-10-27 ENCOUNTER — Other Ambulatory Visit (HOSPITAL_COMMUNITY): Payer: Self-pay | Admitting: *Deleted

## 2013-10-27 MED ORDER — LISINOPRIL 5 MG PO TABS: 5.0000 mg | ORAL_TABLET | Freq: Every day | ORAL | Status: DC

## 2013-11-19 ENCOUNTER — Encounter: Payer: BC Managed Care – PPO | Attending: Physical Medicine & Rehabilitation

## 2013-11-19 ENCOUNTER — Ambulatory Visit (HOSPITAL_BASED_OUTPATIENT_CLINIC_OR_DEPARTMENT_OTHER): Payer: BC Managed Care – PPO | Admitting: Physical Medicine & Rehabilitation

## 2013-11-19 ENCOUNTER — Encounter: Payer: Self-pay | Admitting: Physical Medicine & Rehabilitation

## 2013-11-19 ENCOUNTER — Telehealth: Payer: Self-pay | Admitting: Physical Medicine & Rehabilitation

## 2013-11-19 ENCOUNTER — Telehealth: Payer: Self-pay | Admitting: *Deleted

## 2013-11-19 ENCOUNTER — Ambulatory Visit (HOSPITAL_COMMUNITY)
Admission: RE | Admit: 2013-11-19 | Discharge: 2013-11-19 | Disposition: A | Discharge status: Home / Self Care | Payer: BC Managed Care – PPO | Source: Ambulatory Visit | Attending: Physical Medicine & Rehabilitation | Admitting: Physical Medicine & Rehabilitation

## 2013-11-19 VITALS — BP 113/73 | HR 88 | Resp 14 | Wt 130.0 lb

## 2013-11-19 DIAGNOSIS — G8110 Spastic hemiplegia affecting unspecified side: Secondary | ICD-10-CM | POA: Insufficient documentation

## 2013-11-19 DIAGNOSIS — M79609 Pain in unspecified limb: Secondary | ICD-10-CM | POA: Insufficient documentation

## 2013-11-19 DIAGNOSIS — M79645 Pain in left finger(s): Secondary | ICD-10-CM

## 2013-11-19 DIAGNOSIS — M899 Disorder of bone, unspecified: Secondary | ICD-10-CM | POA: Insufficient documentation

## 2013-11-19 DIAGNOSIS — M949 Disorder of cartilage, unspecified: Secondary | ICD-10-CM

## 2013-11-19 NOTE — Telephone Encounter (Signed)
Margaret Hess fell in the lobby of the op rehab (not witnessed) and hit her head.  She had a smal cut above her nose with a little bleeding, otherwise ok.  Told to see her doctor.  She told them that she also fell at home as well. She has an appt today at 3 pm with you.

## 2013-11-19 NOTE — Telephone Encounter (Signed)
Reviewed x-ray of the third finger left hand no evidence of fracture call to inform patient. Continue buddy tape x1 week

## 2013-11-19 NOTE — Patient Instructions (Signed)
Get x-ray today.  

## 2013-11-19 NOTE — Progress Notes (Signed)
Subjective:    Patient ID: Margaret Hess, female    DOB: 21-Apr-1966, 48 y.o.   MRN: [id number]  HPI 10/21/13 FCR50  FCU0  FDS50  FDP50  FPL25  Left biceps 100, (25x4)  Lower  Post Tib 75  EHL 50  Patient feels like her left hand is looser and her left ankle does not turn in as much however her left great toe still points up  Driving with supervision  Fell at therapy and earlier in the week at home, Left middle finger pain. No syncopal episode, no headaches, does feel "fuzzy", patient feels like she is not as careful with her movements as she once was. May be getting over confident  Pain Inventory Average Pain 3 Pain Right Now 3 My pain is intermittent, dull and aching  In the last 24 hours, has pain interfered with the following? General activity 3 Relation with others 3 Enjoyment of life 3 What TIME of day is your pain at its worst? morning, evening Sleep (in general) Good  Pain is worse with: sitting and laying down Pain improves with: heat/ice, medication and walking Relief from Meds: 4  Mobility walk with assistance use a cane do you drive?  no transfers alone Do you have any goals in this area?  yes  Function disabled: date disabled na I need assistance with the following:  household duties and shopping Do you have any goals in this area?  yes  Neuro/Psych trouble walking depression anxiety  Prior Studies Any changes since last visit?  no  Physicians involved in your care Any changes since last visit?  no   Family History  Problem Relation Age of Onset  . Multiple myeloma Mother   . Heart disease Father   . Hypertension Father    History   Social History  . Marital Status: Single    Spouse Name: N/A    Number of Children: N/A  . Years of Education: N/A   Social History Main Topics  . Smoking status: Never Smoker   . Smokeless tobacco: Never Used  . Alcohol Use: Yes     Comment: 1 mixed drink every 6 months  . Drug Use: No  .  Sexual Activity: Yes    Birth Control/ Protection: Inserts   Other Topics Concern  . None   Social History Narrative  . None   Past Surgical History  Procedure Laterality Date  . Wisdom tooth extraction     Past Medical History  Diagnosis Date  . Migraines   . Hypertension   . Diabetes mellitus   . Stroke   . CHF (congestive heart failure)    BP 113/73  Pulse 88  Resp 14  Wt 130 lb (58.968 kg)  SpO2 99%  Opioid Risk Score:   Fall Risk Score: High Fall Risk (>13 points) (pt educated and given brochure on fall risk previously)   Review of Systems  Gastrointestinal: Positive for constipation.  Musculoskeletal: Positive for gait problem.  Psychiatric/Behavioral: Positive for dysphoric mood. The patient is nervous/anxious.   All other systems reviewed and are negative.       Objective:   Physical Exam Left middle finger swelling and pain at PIP Ashworth 2 at finger flexors, Wrist flexors EHL with persistent Babinski Left foot invertors ashworth 1  Sppech aphasic but improved at short phrase level       Assessment & Plan:  #1. 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 Falls with left third PIP swelling, will check x-rays, if fractured we'll need orthopedic followup. Buddy taped third and fourth digit

## 2014-01-14 ENCOUNTER — Encounter: Payer: Self-pay | Admitting: Physical Medicine & Rehabilitation

## 2014-01-14 ENCOUNTER — Encounter: Payer: BC Managed Care – PPO | Attending: Physical Medicine & Rehabilitation

## 2014-01-14 ENCOUNTER — Ambulatory Visit (HOSPITAL_BASED_OUTPATIENT_CLINIC_OR_DEPARTMENT_OTHER): Payer: BC Managed Care – PPO | Admitting: Physical Medicine & Rehabilitation

## 2014-01-14 VITALS — BP 114/72 | HR 101 | Resp 14 | Wt 128.4 lb

## 2014-01-14 DIAGNOSIS — G8110 Spastic hemiplegia affecting unspecified side: Secondary | ICD-10-CM | POA: Insufficient documentation

## 2014-01-14 NOTE — Progress Notes (Signed)
Botox Injection for spasticity using needle EMG guidance  Dilution: 50 Units/ml Indication: Severe spasticity which interferes with ADL,mobility and/or  hygiene and is unresponsive to medication management and other conservative care Informed consent was obtained after describing risks and benefits of the procedure with the patient. This includes bleeding, bruising, infection, excessive weakness, or medication side effects. A REMS form is on file and signed. Needle: 27 g 1 inch needle electrode Number of units per muscle  FCR25   FCU0   FDS50   FDP50   FPL25   Left biceps 100, (25x4)   Lower   Post Tib 75   EHL 75  All injections were done after obtaining appropriate EMG activity and after negative drawback for blood. The patient tolerated the procedure well. Post procedure instructions were given. A followup appointment was made.

## 2014-01-14 NOTE — Patient Instructions (Signed)
You received a Botox injection today. You may experience soreness at the needle injection sites. Please call us if any of the injection sites turns red after a couple days or if there is any drainage. You may experience muscle weakness as a result of Botox. This would improve with time but can take several weeks to improve. The Botox should start working in about one week. The Botox usually last 3 months. The injection can be repeated every 3 months as needed.  FCR25   FCU0   FDS50   FDP50   FPL25   Left biceps 100, (25x4)   Lower   Post Tib 75   EHL 75

## 2014-02-02 DIAGNOSIS — R5383 Other fatigue: Secondary | ICD-10-CM | POA: Insufficient documentation

## 2014-02-02 DIAGNOSIS — R42 Dizziness and giddiness: Secondary | ICD-10-CM

## 2014-02-02 DIAGNOSIS — N390 Urinary tract infection, site not specified: Secondary | ICD-10-CM

## 2014-02-02 HISTORY — DX: Urinary tract infection, site not specified: N39.0

## 2014-02-02 HISTORY — DX: Other fatigue: R53.83

## 2014-02-02 HISTORY — DX: Dizziness and giddiness: R42

## 2014-02-09 ENCOUNTER — Ambulatory Visit (INDEPENDENT_AMBULATORY_CARE_PROVIDER_SITE_OTHER): Payer: BC Managed Care – PPO | Admitting: Nurse Practitioner

## 2014-02-09 ENCOUNTER — Encounter: Payer: Self-pay | Admitting: Nurse Practitioner

## 2014-02-09 VITALS — BP 90/63 | HR 98 | Temp 97.8°F | Ht 65.0 in | Wt 124.0 lb

## 2014-02-09 DIAGNOSIS — I6350 Cerebral infarction due to unspecified occlusion or stenosis of unspecified cerebral artery: Secondary | ICD-10-CM

## 2014-02-09 DIAGNOSIS — I639 Cerebral infarction, unspecified: Secondary | ICD-10-CM

## 2014-02-09 DIAGNOSIS — G8110 Spastic hemiplegia affecting unspecified side: Secondary | ICD-10-CM

## 2014-02-09 DIAGNOSIS — F801 Expressive language disorder: Secondary | ICD-10-CM

## 2014-02-09 NOTE — Patient Instructions (Signed)
Continue warfarin for secondary stroke prevention and maintain strict control of hypertension with blood pressure goal below 130/90, diabetes with hemoglobin A1c goal below 7% and lipids with LDL cholesterol goal below 70 mg/dL.  Followup in 1 year, sooner as needed.

## 2014-02-09 NOTE — Progress Notes (Signed)
PATIENT: Margaret Hess DOB: 08/03/1966  REASON FOR VISIT: routine follow up for stroke HISTORY FROM: patient  HISTORY OF PRESENT ILLNESS: 11/03/12 PRIOR HP (PS): Margaret Hess is a 14 year Caucasian lady who was admitted with new diagnosed congestive heart failure on 06/15/12 and found to have left ventricular mural thrombus and started on anticoagulation. On 06/19/12 Margaret Hess was readmitted with sudden onset of left-sided weakness and difficulty speaking. Margaret Hess was not a candidate for TPA as Margaret Hess was on anticoagulation with warfarin and INR was greater than 1.7. CT of the head was unremarkable but subsequent MRI scan demonstrated her an acute infarct in the right middle cerebral artery territory with mild mass effect and midline shift. MRA showed an acute occlusion of the right M1 segment of the middle cerebral artery. Transthoracic echo on 06/16/2012 showed ejection fraction of 20% with severe global hypokinesis with a non-mobile calcified organized thrombus in the left ventricular apex. Carotid Doppler showed no significant extracranial stenosis. Urine drug screen was negative. Hemoglobin A1c was 7.0. LDL cholesterol was 64 and total cholesterol was 121 mg percent. Margaret Hess was continued on warfarin and was transferred initially to inpatient rehabilitation where Margaret Hess stayed for a month and subsequently went to a nursing home for 2 months. Margaret Hess has now been living at her home in [facility] with her mother. Margaret Hess is able to ambulate with a 4 pronged cane and uses a wheelchair only for long distances. Her speech has improved but Margaret Hess still has word hesitancy and word finding difficulties and cannot speak fluently. Margaret Hess has not gotten any improvement in the left upper extremity strength. Margaret Hess has a persistent left foot drop and wears a AFO. Margaret Hess has finished home therapies but outpatient therapies have not been set up. Margaret Hess complains of pain in the left arm as well as leg and is currently taking gabapentin 600 mg 3 times a day.  Margaret Hess states her sugars are under good control and Margaret Hess is tolerating warfarin without bleeding, bruising or other side effects. Margaret Hess has not seen Dr. Jodean Lima for the last several months. Margaret Hess has been approved for Medicaid.  UPDATE 04/21/13 (LL): Has had significant pain with left shoulder, Dr. Wynn Banker injected left glenohumeral in June with very good results. Recent dizziness, nausea assocaited with low BP, lisinipril, carvedilol, spironolactone, and lasix were stopped by cards. Had to go to ER 2 weeks ago for rehydration with IV fluids. Feeling much better now. Getting outpatient ST/OT/PT, has had good progress with left arm and hand splint, wearing daily. Has had around 30 lb. total weight loss, does not need insulin now, still on orals. Continues on Warfarin therapy, goal INR is 3.0. Patient denies medication side effects, with no signs of bleeding or excessive bruising. Functional capacity is much improved. Margaret Hess is able to live independently and has a part-time care giver for help with cleaning and preparing some meals. Margaret Hess is able to dress and bathe herself. Margaret Hess is hoping to be able to drive again; is interested in enrolling in a driving class for stroke patients through [facility].  UPDATE 02/09/14 (LL): Since last visit, Margaret Hess has continued to get Botox injections for spasticity with Dr. Larna Daughters with good results.  Margaret Hess is tolerating Warfarin well with no signs of significant bleeding or bruising.  Margaret Hess states blood pressure is under good control, blood pressure in the office today is 90/63.  Margaret Hess is fairly independent in her activities and is doing well.  Margaret Hess has no new complaints.  REVIEW OF SYSTEMS: Full 14 system review of systems performed and notable only for:  easy bleeding, easy bruising, palpitations, daytime sleepiness, constipation, back pain, aching muscles, walking difficulty, memory loss, speech difficulty, depression  ALLERGIES: Allergies  Allergen Reactions  . Penicillins  Rash    HOME MEDICATIONS: Outpatient Prescriptions Prior to Visit  Medication Sig Dispense Refill  . acetaminophen (TYLENOL) 500 MG tablet Take 1,000 mg by mouth every 6 (six) hours as needed for pain.       Marland Kitchen ALPRAZolam (XANAX) 0.5 MG tablet Take 0.5 mg by mouth at bedtime.      . betamethasone dipropionate (DIPROLENE) 0.05 % cream       . carvedilol (COREG) 3.125 MG tablet Take 1 tablet (3.125 mg total) by mouth 2 (two) times daily with a meal.  60 tablet  6  . chlorzoxazone (PARAFON) 500 MG tablet Take 500 mg by mouth 3 (three) times daily as needed for muscle spasms.      . citalopram (CELEXA) 20 MG tablet Take 40 mg by mouth every morning.       . clobetasol (TEMOVATE) 0.05 % external solution       . DULoxetine (CYMBALTA) 60 MG capsule Take 60 mg by mouth daily.      Marland Kitchen gabapentin (NEURONTIN) 800 MG tablet Take 800-1,600 mg by mouth 3 (three) times daily. Takes 1 tablet in morning, 1 tablet at lunch, and 2 tablets at bedtime      . HYDROcodone-acetaminophen (NORCO/VICODIN) 5-325 MG per tablet Take 1 tablet by mouth 2 (two) times daily.       Marland Kitchen lisinopril (PRINIVIL,ZESTRIL) 5 MG tablet Take 1 tablet (5 mg total) by mouth daily.  30 tablet  6  . metFORMIN (GLUCOPHAGE) 500 MG tablet Take 500 mg by mouth 3 (three) times daily with meals.      . mupirocin cream (BACTROBAN) 2 %       . nitrofurantoin, macrocrystal-monohydrate, (MACROBID) 100 MG capsule       . ondansetron (ZOFRAN) 4 MG tablet       . ondansetron (ZOFRAN-ODT) 8 MG disintegrating tablet       . ONE TOUCH ULTRA TEST test strip       . pantoprazole (PROTONIX) 40 MG tablet Take 40 mg by mouth every morning.       . phenazopyridine (PYRIDIUM) 100 MG tablet       . spironolactone (ALDACTONE) 25 MG tablet Take 25 mg by mouth daily.       . tamsulosin (FLOMAX) 0.4 MG CAPS capsule       . warfarin (COUMADIN) 2.5 MG tablet Take 5 mg by mouth daily.       No facility-administered medications prior to visit.     PHYSICAL  EXAM  Filed Vitals:   02/09/14 1528  BP: 90/63  Pulse: 98  Temp: 97.8 F (36.6 C)  TempSrc: Oral  Height: 5\' 5"  (1.651 m)  Weight: 124 lb (56.246 kg)   Body mass index is 20.63 kg/(m^2). No exam data present   Physical Exam  General: Pleasant young Caucasian lady, in no distress. Afebrile.  Head: nontraumatic  Ears, Nose and Throat: Hearing is normal.  Neck: supple without bruit  Respiratory: clear to auscultation  Cardiovascular: no murmur or gallop  Musculoskeletal: left foot drop wears a AFO   Neurologic Exam  Mental Status: Awake, alert and oriented to time, place and person. Nonfluent speech with word hesitancy but no dysarthria. Cranial Nerves: Eye movements are full range without  nystagmus. Visual fields are full to confrontational testing. Face is asymmetric with left lower face weakness. Tongue is midline. Hearing is normal.  Motor: reveals intense left upper extremity monoplegia with 0/5 strength and diminished tone. Tone is increased in the left lower extremity with 4/5 strength at the hip and knees with left ankle foot drop and 0/5 strength in ankle dorsiflexors.  Sensory: Touch and pinprick sensations are Diminished on the left  Coordination: impaired on the left  Gait and Station: spastic hemiplegic gait and uses her 4 pronged cane.  Reflexes: Deep tendon reflexes are 2+ asymmetric brisker on theleft.    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 Margaret Hess has residual significant disability from mild expressive speech difficulties and spastic left hemiplegia. Vascular risk factors of cardiomyopathy with low ejection fraction, (recently has improved to an EF of 50%), Diabetes and left ventricular mural thrombus. Margaret Hess also has post stroke pain in the left upper and lower extremity; relief with gabapentin and hydrocodone. [facility]. Mood much improved now that pain is under control and some functional improvement has been achieved with PT.   PLAN: Continue warfarin for secondary stroke prevention and maintain strict control of hypertension with blood pressure goal below 130/90, diabetes with hemoglobin A1c goal below 7% and lipids with LDL cholesterol goal below 70 mg/dL.  Followup as needed, patient wishes to be seen on as as needed basis.  Ronal FearLYNN E. Kingsten Enfield, MSN, NP-C 02/09/2014, 3:33 PM [facility] [address], [address] [place], [place] [address] [phone]  Note: This document was prepared with digital dictation and possible smart phrase technology. Any transcriptional errors that result from this process are unintentional.

## 2014-02-14 NOTE — Progress Notes (Signed)
I agree with above 

## 2014-02-21 ENCOUNTER — Encounter: Payer: Self-pay | Admitting: Physical Medicine & Rehabilitation

## 2014-02-21 ENCOUNTER — Encounter: Payer: BC Managed Care – PPO | Attending: Physical Medicine & Rehabilitation

## 2014-02-21 ENCOUNTER — Ambulatory Visit (HOSPITAL_BASED_OUTPATIENT_CLINIC_OR_DEPARTMENT_OTHER): Payer: BC Managed Care – PPO | Admitting: Physical Medicine & Rehabilitation

## 2014-02-21 VITALS — BP 109/69 | HR 86 | Resp 14 | Ht 65.0 in | Wt 130.0 lb

## 2014-02-21 DIAGNOSIS — G8110 Spastic hemiplegia affecting unspecified side: Secondary | ICD-10-CM

## 2014-02-21 MED ORDER — TIZANIDINE HCL 4 MG PO TABS: 4.0000 mg | ORAL_TABLET | Freq: Every day | ORAL | Status: DC

## 2014-02-21 NOTE — Patient Instructions (Signed)
Off the shelf AFO carbon fiber  Next visit for Botox

## 2014-02-21 NOTE — Progress Notes (Signed)
Subjective:    Patient ID: Margaret Hess, female    DOB: 1966-01-06, 48 y.o.   MRN: [id number]  HPI Still has complaints of toe curling with standing. This is on the left foot. Not much improvement with hyperactive Babinski.  Left hand and wrist spasticity are much improved. Still having left bicep spasticity mainly at night that interferes with sleep. Is not taking any oral spasticity medications at this point.  No further falls. No pains except when her toes curl during ambulation. This occurs mainly when she is not using her AFO.  Patient also is questions about AFO. She's had current brace which is a posterior leafspring with hinged ankle for about a year. She is looking for something lighter and less bulky that she may wear with more types of shoes We specifically discussed that sandals and other type of unsupported shoes would not be safe. These would cause increased risk for ankle inversion injury as well as fall risk. Discussed this with the patient as well as her caregiver Baxter Flattery  Jan 14, 2014  FCR25  FCU0  FDS50  FDP50  FPL25  Left biceps 100, (25x4)  Lower  Post Tib 75  EHL 75  Pain Inventory Average Pain 3 Pain Right Now 3 My pain is intermittent, dull and aching  In the last 24 hours, has pain interfered with the following? General activity 4 Relation with others 4 Enjoyment of life 3 What TIME of day is your pain at its worst? morning and evening Sleep (in general) Good  Pain is worse with: inactivity Pain improves with: rest, heat/ice, therapy/exercise and medication Relief from Meds: 4  Mobility walk without assistance use a cane ability to climb steps?  yes do you drive?  no Do you have any goals in this area?  yes  Function disabled: date disabled . I need assistance with the following:  household duties and shopping Do you have any goals in this area?  yes  Neuro/Psych trouble walking depression anxiety  Prior Studies Any changes  since last visit?  no  Physicians involved in your care Any changes since last visit?  no   Family History  Problem Relation Age of Onset  . Multiple myeloma Mother   . Heart disease Father   . Hypertension Father    History   Social History  . Marital Status: Single    Spouse Name: N/A    Number of Children: N/A  . Years of Education: N/A   Social History Main Topics  . Smoking status: Never Smoker   . Smokeless tobacco: Never Used  . Alcohol Use: Yes     Comment: 1 mixed drink every 6 months  . Drug Use: No  . Sexual Activity: Yes    Birth Control/ Protection: Inserts   Other Topics Concern  . None   Social History Narrative  . None   Past Surgical History  Procedure Laterality Date  . Wisdom tooth extraction     Past Medical History  Diagnosis Date  . Migraines   . Hypertension   . Diabetes mellitus   . Stroke   . CHF (congestive heart failure)    BP 109/69  Pulse 86  Resp 14  Ht '5\' 5"'  (1.651 m)  Wt 130 lb (58.968 kg)  BMI 21.63 kg/m2  SpO2 98%  Opioid Risk Score:   Fall Risk Score: Moderate Fall Risk (6-13 points) (patient educated handout declined)   Review of Systems  Gastrointestinal: Positive for constipation.  Musculoskeletal: Positive for back pain and gait problem.  Psychiatric/Behavioral: Positive for dysphoric mood. The patient is nervous/anxious.   All other systems reviewed and are negative.      Objective:   Physical Exam Left foot without skin breakdown. There is a hyperactive Babinski however when she puts weight on the left foot the great toe as well as the second through fifth toes go into flexion. There's inversion at the ankle with weight-bearing. Sustained clonus at the left ankle  Left elbow is Ashworth grade 1  Ambulation with AFO good heel strike good roll over no evidence of knee hyperextension.  Ambulation without AFO shows decreased heelstrike tends to be on the lateral aspect of the foot. Needs some hip hiking  to compensate for foot drop. No evidence of genu recurvatum  No evidence of increased elbow flexion during ambulation  Left finger and wrist flexors are Ashworth grade 0       Assessment & Plan:  1. 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 She also has aphasia  In terms of spasticity has had good relief of right finger and wrist flexor spasticity after Botox injection the bicep spasticity is better as well but still has some nighttime tone that interferes with sleep. For this I would recommend tizanidine 4 mg each bedtime  Lower extremity spasticity still has significant toe flexor tone. The hyperactive Babinski was not positively affected by the Botox to the EHL despite a 75 unit dose Would instead use 75 units to the F. DL, and flexor hallucis brevis  Next visit recommend FCR 25 FDS 50 FDP 50 FPL 25 Biceps 100 Posterior tibialis 75 F. DL 50 Flexor hallucis brevis 25  Over half of the 25 min visit was spent counseling and coordinating care. Went over my recommendations in detail with the patient who has aphasia and requires repeated instructions. Also discussed this with her caregiver

## 2014-02-22 ENCOUNTER — Other Ambulatory Visit (HOSPITAL_COMMUNITY): Payer: Self-pay | Admitting: *Deleted

## 2014-02-22 MED ORDER — LISINOPRIL 5 MG PO TABS: 5.0000 mg | ORAL_TABLET | Freq: Every day | ORAL | Status: DC

## 2014-03-04 ENCOUNTER — Emergency Department (HOSPITAL_BASED_OUTPATIENT_CLINIC_OR_DEPARTMENT_OTHER)
Admission: EM | Admit: 2014-03-04 | Discharge: 2014-03-04 | Disposition: A | Discharge status: Home / Self Care | Payer: BC Managed Care – PPO | Attending: Emergency Medicine | Admitting: Emergency Medicine

## 2014-03-04 ENCOUNTER — Emergency Department (HOSPITAL_BASED_OUTPATIENT_CLINIC_OR_DEPARTMENT_OTHER): Payer: BC Managed Care – PPO

## 2014-03-04 ENCOUNTER — Encounter (HOSPITAL_BASED_OUTPATIENT_CLINIC_OR_DEPARTMENT_OTHER): Payer: Self-pay | Admitting: Emergency Medicine

## 2014-03-04 DIAGNOSIS — Y9389 Activity, other specified: Secondary | ICD-10-CM | POA: Insufficient documentation

## 2014-03-04 DIAGNOSIS — W1811XA Fall from or off toilet without subsequent striking against object, initial encounter: Secondary | ICD-10-CM | POA: Insufficient documentation

## 2014-03-04 DIAGNOSIS — Z8673 Personal history of transient ischemic attack (TIA), and cerebral infarction without residual deficits: Secondary | ICD-10-CM | POA: Insufficient documentation

## 2014-03-04 DIAGNOSIS — Y929 Unspecified place or not applicable: Secondary | ICD-10-CM | POA: Insufficient documentation

## 2014-03-04 DIAGNOSIS — G43909 Migraine, unspecified, not intractable, without status migrainosus: Secondary | ICD-10-CM | POA: Insufficient documentation

## 2014-03-04 DIAGNOSIS — Z880 Allergy status to penicillin: Secondary | ICD-10-CM | POA: Insufficient documentation

## 2014-03-04 DIAGNOSIS — S0083XA Contusion of other part of head, initial encounter: Principal | ICD-10-CM | POA: Insufficient documentation

## 2014-03-04 DIAGNOSIS — I10 Essential (primary) hypertension: Secondary | ICD-10-CM | POA: Insufficient documentation

## 2014-03-04 DIAGNOSIS — R791 Abnormal coagulation profile: Secondary | ICD-10-CM | POA: Insufficient documentation

## 2014-03-04 DIAGNOSIS — S0003XA Contusion of scalp, initial encounter: Secondary | ICD-10-CM | POA: Insufficient documentation

## 2014-03-04 DIAGNOSIS — Z79899 Other long term (current) drug therapy: Secondary | ICD-10-CM | POA: Insufficient documentation

## 2014-03-04 DIAGNOSIS — Z7901 Long term (current) use of anticoagulants: Secondary | ICD-10-CM | POA: Insufficient documentation

## 2014-03-04 DIAGNOSIS — S1093XA Contusion of unspecified part of neck, initial encounter: Principal | ICD-10-CM

## 2014-03-04 DIAGNOSIS — I509 Heart failure, unspecified: Secondary | ICD-10-CM | POA: Insufficient documentation

## 2014-03-04 DIAGNOSIS — S0093XA Contusion of unspecified part of head, initial encounter: Secondary | ICD-10-CM

## 2014-03-04 DIAGNOSIS — Z5181 Encounter for therapeutic drug level monitoring: Secondary | ICD-10-CM

## 2014-03-04 DIAGNOSIS — E119 Type 2 diabetes mellitus without complications: Secondary | ICD-10-CM | POA: Insufficient documentation

## 2014-03-04 LAB — PROTIME-INR
INR: 1.35 (ref 0.00–1.49)
Prothrombin Time: 16.7 seconds — ABNORMAL HIGH (ref 11.6–15.2)

## 2014-03-04 NOTE — ED Provider Notes (Addendum)
CSN: [id number]     Arrival date & time 03/04/14  0904 History   First MD Initiated Contact with Patient 03/04/14 [phone]     Chief Complaint  Patient presents with  . Fall     (Consider location/radiation/quality/duration/timing/severity/associated sxs/prior Treatment) Patient is a 48 y.o. female presenting with fall. The history is provided by the patient.  Fall This is a new (she was sitting on the toilet this morning and lost her balance falling onto her left side and hitting her head on the door) problem. The current episode started less than 1 hour ago. The problem occurs constantly. The problem has not changed since onset.Associated symptoms include headaches. Pertinent negatives include no chest pain, no abdominal pain and no shortness of breath. Associated symptoms comments: No LOC, dizziness, vision changes or new weakness or numbness.  . Nothing aggravates the symptoms. Nothing relieves the symptoms. She has tried nothing for the symptoms.    Past Medical History  Diagnosis Date  . Migraines   . Hypertension   . Diabetes mellitus   . Stroke   . CHF (congestive heart failure)    Past Surgical History  Procedure Laterality Date  . Wisdom tooth extraction     Family History  Problem Relation Age of Onset  . Multiple myeloma Mother   . Heart disease Father   . Hypertension Father    History  Substance Use Topics  . Smoking status: Never Smoker   . Smokeless tobacco: Never Used  . Alcohol Use: Yes     Comment: 1 mixed drink every 6 months   OB History   Grav Para Term Preterm Abortions TAB SAB Ect Mult Living                 Review of Systems  Respiratory: Negative for shortness of breath.   Cardiovascular: Negative for chest pain.  Gastrointestinal: Negative for abdominal pain.  Neurological: Positive for headaches.  All other systems reviewed and are negative.     Allergies  Penicillins  Home Medications   Prior to Admission medications   Medication  Sig Start Date End Date Taking? Authorizing Provider  acetaminophen (TYLENOL) 500 MG tablet Take 1,000 mg by mouth every 6 (six) hours as needed for pain.     Historical Provider, MD  ALPRAZolam Prudy Feeler) 0.5 MG tablet Take 0.5 mg by mouth at bedtime.    Historical Provider, MD  betamethasone dipropionate (DIPROLENE) 0.05 % cream  06/09/13   Historical Provider, MD  carvedilol (COREG) 3.125 MG tablet Take 1 tablet (3.125 mg total) by mouth 2 (two) times daily with a meal. 08/03/13   Dolores Patty, MD  chlorzoxazone (PARAFON) 500 MG tablet Take 500 mg by mouth 3 (three) times daily as needed for muscle spasms.    Historical Provider, MD  citalopram (CELEXA) 20 MG tablet Take 40 mg by mouth every morning.     Historical Provider, MD  clobetasol (TEMOVATE) 0.05 % external solution  06/10/13   Historical Provider, MD  DULoxetine (CYMBALTA) 60 MG capsule Take 60 mg by mouth daily.    Historical Provider, MD  furosemide (LASIX) 20 MG tablet Take 20 mg by mouth.    Historical Provider, MD  gabapentin (NEURONTIN) 800 MG tablet Take 800-1,600 mg by mouth 3 (three) times daily. Takes 1 tablet in morning, 1 tablet at lunch, and 2 tablets at bedtime    Historical Provider, MD  HYDROcodone-acetaminophen (NORCO/VICODIN) 5-325 MG per tablet Take 1 tablet by mouth 2 (two) times daily.  Historical Provider, MD  Linaclotide Rolan Lipa) 145 MCG CAPS capsule Take 1 capsule by mouth daily. 02/04/14   Historical Provider, MD  lisinopril (PRINIVIL,ZESTRIL) 5 MG tablet Take 1 tablet (5 mg total) by mouth daily. MUST HAVE FOLLOW UP APPOINTMENT FOR FURTHER REFILLS 02/22/14   Jolaine Artist, MD  metFORMIN (GLUCOPHAGE) 500 MG tablet Take 500 mg by mouth daily with breakfast.     Historical Provider, MD  mupirocin cream (BACTROBAN) 2 %  06/08/13   Historical Provider, MD  nitrofurantoin, macrocrystal-monohydrate, (MACROBID) 100 MG capsule  08/23/13   Historical Provider, MD  ondansetron (ZOFRAN-ODT) 8 MG disintegrating tablet   08/27/13   Historical Provider, MD  ONE TOUCH ULTRA TEST test strip  03/25/13   Historical Provider, MD  pantoprazole (PROTONIX) 40 MG tablet Take 40 mg by mouth every morning.     Historical Provider, MD  phenazopyridine (PYRIDIUM) 100 MG tablet  08/23/13   Historical Provider, MD  spironolactone (ALDACTONE) 25 MG tablet Take 25 mg by mouth daily.  02/25/13   Historical Provider, MD  tamsulosin (FLOMAX) 0.4 MG CAPS capsule  08/27/13   Historical Provider, MD  tiZANidine (ZANAFLEX) 4 MG tablet Take 1 tablet (4 mg total) by mouth at bedtime. 02/21/14   Charlett Blake, MD  warfarin (COUMADIN) 2.5 MG tablet Take 5 mg by mouth daily. 02/01/13   Historical Provider, MD   BP 118/77  Pulse 72  Temp(Src) 97.6 F (36.4 C) (Oral)  Resp 18  Wt 130 lb (58.968 kg)  SpO2 100% Physical Exam  Nursing note and vitals reviewed. Constitutional: She is oriented to person, place, and time. She appears well-developed and well-nourished. No distress.  HENT:  Head: Normocephalic and atraumatic.    Right Ear: Tympanic membrane and ear canal normal.  Left Ear: Tympanic membrane and ear canal normal.  Mouth/Throat: Oropharynx is clear and moist.  Tender hematoma to the left temple and frontal scalp region  Eyes: Conjunctivae and EOM are normal. Pupils are equal, round, and reactive to light.  Neck: Normal range of motion. Neck supple. No spinous process tenderness and no muscular tenderness present.  Cardiovascular: Normal rate, regular rhythm and intact distal pulses.   No murmur heard. Pulmonary/Chest: Effort normal and breath sounds normal. No respiratory distress. She has no wheezes. She has no rales.  Abdominal: Soft. She exhibits no distension. There is no tenderness. There is no rebound and no guarding.  Musculoskeletal: Normal range of motion. She exhibits no edema and no tenderness.       Right shoulder: Normal.       Left shoulder: Normal.       Right hip: Normal.       Left hip: Normal.        Right knee: Normal.       Left knee: Normal.  Neurological: She is alert and oriented to person, place, and time. No cranial nerve deficit.  4/5 strength in the left upper/lower ext.  Mild decreased sensation on the left upper/lower ext compared to right.  Mild expressive aphasia  Skin: Skin is warm and dry. No rash noted. No erythema.  Psychiatric: She has a normal mood and affect. Her behavior is normal.    ED Course  Procedures (including critical care time) Labs Review Labs Reviewed  PROTIME-INR - Abnormal; Notable for the following:    Prothrombin Time 16.7 (*)    All other components within normal limits    Imaging Review Ct Head Wo Contrast  03/04/2014   CLINICAL  DATA:  Recent traumatic injury and pain  EXAM: CT HEAD WITHOUT CONTRAST  TECHNIQUE: Contiguous axial images were obtained from the base of the skull through the vertex without intravenous contrast.  COMPARISON:  06/23/2012  FINDINGS: The bony calvarium is intact. No gross soft tissue abnormality is noted. There are changes consistent with prior right middle cerebral artery infarct with significant encephalomalacia changes identified. No findings to suggest acute hemorrhage, acute infarction or space-occupying mass lesion are noted. Mild midline shift from left-to-right is seen related to the volume loss on the right.  IMPRESSION: Chronic changes without acute abnormality.   Electronically Signed   By: Inez Catalina M.D.   On: 03/04/2014 09:44     EKG Interpretation None      MDM   Final diagnoses:  Head contusion, initial encounter  Subtherapeutic anticoagulation    Pt with prior hx of stroke on coumadin who was sitting on the toilet today and lost her balance falling to the left side.  She states something similar to this has happened 3 time in the last 3 years.  Pt is awake and alert and at her baseline.  She has persistent deficits on the left from her stroke with mild aphasia.  She confirms this is all normal.  Pt  did hit her head on the door but denies LOC however she is on coumadin and level last checked in aug of last year when it was >4.  Pt has no new deficits today and no signs of trauma to the ext requiring imaging.  Will get head CT and check INR.  10:01 AM Head CT without acute issues.  INR subtherapuetic at 1.35.  Will have pt double coumadin dose tonight and she has f/u appt at coumadin clinic next week.  Blanchie Dessert, MD 03/04/14 1002  Blanchie Dessert, MD 03/04/14 1003

## 2014-03-04 NOTE — ED Notes (Signed)
Per ems:  Pt fell in bathroom, hit left side of head.  Pt has hx of CVA with left sided weakness.  Some difficulty with speech.  Pt c/o pain to head and left leg has "weird feeling".

## 2014-03-04 NOTE — Discharge Instructions (Signed)
Contusion °A contusion is a deep bruise. Contusions are the result of an injury that caused bleeding under the skin. The contusion may turn blue, purple, or yellow. Minor injuries will give you a painless contusion, but more severe contusions may stay painful and swollen for a few weeks.  °CAUSES  °A contusion is usually caused by a blow, trauma, or direct force to an area of the body. °SYMPTOMS  °· Swelling and redness of the injured area. °· Bruising of the injured area. °· Tenderness and soreness of the injured area. °· Pain. °DIAGNOSIS  °The diagnosis can be made by taking a history and physical exam. An X-ray, CT scan, or MRI may be needed to determine if there were any associated injuries, such as fractures. °TREATMENT  °Specific treatment will depend on what area of the body was injured. In general, the best treatment for a contusion is resting, icing, elevating, and applying cold compresses to the injured area. Over-the-counter medicines may also be recommended for pain control. Ask your caregiver what the best treatment is for your contusion. °HOME CARE INSTRUCTIONS  °· Put ice on the injured area. °¨ Put ice in a plastic bag. °¨ Place a towel between your skin and the bag. °¨ Leave the ice on for 15-20 minutes, 3-4 times a day, or as directed by your health care provider. °· Only take over-the-counter or prescription medicines for pain, discomfort, or fever as directed by your caregiver. Your caregiver may recommend avoiding anti-inflammatory medicines (aspirin, ibuprofen, and naproxen) for 48 hours because these medicines may increase bruising. °· Rest the injured area. °· If possible, elevate the injured area to reduce swelling. °SEEK IMMEDIATE MEDICAL CARE IF:  °· You have increased bruising or swelling. °· You have pain that is getting worse. °· Your swelling or pain is not relieved with medicines. °MAKE SURE YOU:  °· Understand these instructions. °· Will watch your condition. °· Will get help right  away if you are not doing well or get worse. °Document Released: 05/29/2005 Document Revised: 08/24/2013 Document Reviewed: 06/24/2011 °[employer]® Patient Information ©2015 [employer]. This information is not intended to replace advice given to you by your health care provider. Make sure you discuss any questions you have with your health care provider. ° °

## 2014-03-21 ENCOUNTER — Encounter: Payer: Self-pay | Admitting: Physical Medicine & Rehabilitation

## 2014-03-21 ENCOUNTER — Telehealth: Payer: Self-pay

## 2014-03-21 DIAGNOSIS — G8110 Spastic hemiplegia affecting unspecified side: Secondary | ICD-10-CM

## 2014-03-21 NOTE — Telephone Encounter (Signed)
Patient called requesting Rx for brace and a DR note.  Not other details left.  Left message for patient to call office with more details regarding her requests.

## 2014-03-21 NOTE — Telephone Encounter (Signed)
Patient needs an rx for Left leg, ankle, foot AFO.  She also needs a letter of medical necessity for her insurance to cover it.  Please fax letter and order to [facility] at [phone].

## 2014-03-25 ENCOUNTER — Ambulatory Visit (HOSPITAL_COMMUNITY)
Admission: RE | Admit: 2014-03-25 | Discharge: 2014-03-25 | Disposition: A | Discharge status: Home / Self Care | Payer: BC Managed Care – PPO | Source: Ambulatory Visit | Attending: Family Medicine | Admitting: Family Medicine

## 2014-03-25 ENCOUNTER — Encounter (HOSPITAL_COMMUNITY): Payer: Self-pay

## 2014-03-25 ENCOUNTER — Ambulatory Visit (HOSPITAL_BASED_OUTPATIENT_CLINIC_OR_DEPARTMENT_OTHER)
Admission: RE | Admit: 2014-03-25 | Discharge: 2014-03-25 | Disposition: A | Discharge status: Home / Self Care | Payer: BC Managed Care – PPO | Source: Ambulatory Visit | Attending: Cardiology | Admitting: Cardiology

## 2014-03-25 VITALS — BP 90/64 | HR 85 | Wt 131.0 lb

## 2014-03-25 DIAGNOSIS — I509 Heart failure, unspecified: Secondary | ICD-10-CM | POA: Insufficient documentation

## 2014-03-25 DIAGNOSIS — I517 Cardiomegaly: Secondary | ICD-10-CM

## 2014-03-25 DIAGNOSIS — I639 Cerebral infarction, unspecified: Secondary | ICD-10-CM

## 2014-03-25 DIAGNOSIS — I5022 Chronic systolic (congestive) heart failure: Secondary | ICD-10-CM

## 2014-03-25 DIAGNOSIS — I6340 Cerebral infarction due to embolism of unspecified cerebral artery: Secondary | ICD-10-CM

## 2014-03-25 MED ORDER — ASPIRIN EC 81 MG PO TBEC: 81.0000 mg | DELAYED_RELEASE_TABLET | Freq: Every day | ORAL | Status: AC

## 2014-03-25 NOTE — Patient Instructions (Signed)
Follow up in 1 year!  Start taking aspirin 81mg  once daily.  STOP COUMADIN! (woooo!)  Do the following things EVERYDAY: 1) Weigh yourself in the morning before breakfast. Write it down and keep it in a log. 2) Take your medicines as prescribed 3) Eat low salt foods-Limit salt (sodium) to 2000 mg per day.  4) Stay as active as you can everyday 5) Limit all fluids for the day to less than 2 liters

## 2014-03-25 NOTE — Progress Notes (Signed)
*  PRELIMINARY RESULTS* Echocardiogram 2D Echocardiogram has been performed.  Margaret Hess 03/25/2014, 10:58 AM

## 2014-03-27 NOTE — Progress Notes (Signed)
Patient ID: Margaret Hess, female   DOB: 06/16/1966, 48 y.o.   MRN: [id number]  PCP: Dr. Piedad Climes  Neurologist: Dr Pearlean Brownie Pain: Dr Wynn Banker  HPI: Margaret Hess is 48 yo woman with history of DM, HTN, NICM, EF <20%. 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   07/07/12 ECHO EF 20-25% grade II diastolic dysfunction  10/21/12 ECHO EF 35-40% grade 1 diastolic dysfunction 04/15/13 ECHO EF 50% 7/15 ECHO EF 50-55%, mild LVH, no LV apical thrombus.   Follow up: Doing well. Denies SOB, orthopnea, CP or edema. Walking with cane. Takings medications as prescribed. Following low salt diet and drinking less than 2L a day.   She has not had to use any Lasix.   Labs (1/15): K 4.1, creatinine 0.61  ROS: All systems negative except as listed in HPI, PMH and Problem List.  Past Medical History  Diagnosis Date  . Migraines   . Hypertension   . Diabetes mellitus   . Stroke   . CHF (congestive heart failure)     Current Outpatient Prescriptions  Medication Sig Dispense Refill  . acetaminophen (TYLENOL) 500 MG tablet Take 1,000 mg by mouth every 6 (six) hours as needed for pain.       Marland Kitchen ALPRAZolam (XANAX) 0.5 MG tablet Take 0.5 mg by mouth at bedtime.      . betamethasone dipropionate (DIPROLENE) 0.05 % cream       . carvedilol (COREG) 3.125 MG tablet Take 1 tablet (3.125 mg total) by mouth 2 (two) times daily with a meal.  60 tablet  6  . chlorzoxazone (PARAFON) 500 MG tablet Take 500 mg by mouth 3 (three) times daily as needed for muscle spasms.      . citalopram (CELEXA) 20 MG tablet Take 40 mg by mouth every morning.       . clobetasol (TEMOVATE) 0.05 % external solution       . DULoxetine (CYMBALTA) 60 MG capsule Take 60 mg by mouth daily.      . furosemide (LASIX) 20 MG tablet Take 20 mg by mouth.      . gabapentin (NEURONTIN) 800 MG tablet Take 800-1,600 mg by mouth 3 (three) times daily. Takes 1 tablet in morning, 1 tablet at lunch, and 2 tablets at bedtime      .  HYDROcodone-acetaminophen (NORCO/VICODIN) 5-325 MG per tablet Take 1 tablet by mouth 2 (two) times daily.       . Linaclotide (LINZESS) 145 MCG CAPS capsule Take 1 capsule by mouth daily.      Marland Kitchen lisinopril (PRINIVIL,ZESTRIL) 5 MG tablet Take 1 tablet (5 mg total) by mouth daily. MUST HAVE FOLLOW UP APPOINTMENT FOR FURTHER REFILLS  30 tablet  2  . metFORMIN (GLUCOPHAGE) 500 MG tablet Take 500 mg by mouth daily with breakfast.       . mupirocin cream (BACTROBAN) 2 %       . nitrofurantoin, macrocrystal-monohydrate, (MACROBID) 100 MG capsule       . ondansetron (ZOFRAN-ODT) 8 MG disintegrating tablet       . ONE TOUCH ULTRA TEST test strip       . pantoprazole (PROTONIX) 40 MG tablet Take 40 mg by mouth every morning.       . phenazopyridine (PYRIDIUM) 100 MG tablet       . spironolactone (ALDACTONE) 25 MG tablet Take 25 mg by mouth daily.       . tamsulosin (FLOMAX) 0.4 MG CAPS capsule       .  tiZANidine (ZANAFLEX) 4 MG tablet Take 1 tablet (4 mg total) by mouth at bedtime.  30 tablet  2  . aspirin EC 81 MG tablet Take 1 tablet (81 mg total) by mouth daily.  90 tablet  3   No current facility-administered medications for this encounter.     Filed Vitals:   03/25/14 1129  BP: 90/64  Pulse: 85  Weight: 131 lb (59.421 kg)  SpO2: 100%    PHYSICAL EXAM: General:  Well appearing. No resp difficulty. Caregiver present.  HEENT: normal Neck: supple. JVP flat. Carotids 2+ bilaterally; no bruits. No lymphadenopathy or thryomegaly appreciated. Cor: PMI normal. Irregular rate & rhythm. No rubs, gallops or murmurs. Lungs: clear Abdomen: soft, nontender, nondistended. No hepatosplenomegaly. No bruits or masses. Good bowel sounds. Extremities: no cyanosis, clubbing, rash, edema. LLE brace in place. LUE flaccid Neuro: alert. Communicative but signifocant expressive aphasia persists. cranial nerves grossly intact. L-sided weakness. Affect pleasant.   ASSESSMENT & PLAN:  1) Chronic systolic HF:  nonischemic, ?viral cardiomyopathy; EF improved to 50-55% (echo today reivewed). NYHA I symptoms and volume status stable. She is not currently on lasix instructed to call if weight is increasing. She has not tolerated lasix in the past due to severe hypotension. - Continue coreg 3.125 mg BID, will not titrate with soft BP. - Continue low dose lisinopril 5 mg daily, has not tolerated up titration in the past. 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  She has residual expressive aphasia and L-sided weakness.  Echo today, about 1 year after prior, shows EF stably improved to 50-55% with no LV thrombus.  Given lack of thrombus and recovery of function, I think that she can stop warfarin and go on ASA 81 mg daily.  Followup in 1 year if no problems  Marca AnconaDalton Daesean Lazarz 03/27/2014

## 2014-04-17 ENCOUNTER — Other Ambulatory Visit (HOSPITAL_COMMUNITY): Payer: Self-pay | Admitting: Internal Medicine

## 2014-04-22 ENCOUNTER — Encounter: Payer: BC Managed Care – PPO | Attending: Physical Medicine & Rehabilitation

## 2014-04-22 ENCOUNTER — Encounter: Payer: Self-pay | Admitting: Physical Medicine & Rehabilitation

## 2014-04-22 ENCOUNTER — Ambulatory Visit (HOSPITAL_BASED_OUTPATIENT_CLINIC_OR_DEPARTMENT_OTHER): Payer: BC Managed Care – PPO | Admitting: Physical Medicine & Rehabilitation

## 2014-04-22 VITALS — BP 100/67 | HR 80 | Resp 14 | Ht 65.0 in | Wt 130.0 lb

## 2014-04-22 DIAGNOSIS — G8110 Spastic hemiplegia affecting unspecified side: Secondary | ICD-10-CM | POA: Insufficient documentation

## 2014-04-22 NOTE — Progress Notes (Signed)
Botox Injection for spasticity using needle EMG guidance  Dilution: 50 Units/ml Indication: Severe spasticity which interferes with ADL,mobility and/or  hygiene and is unresponsive to medication management and other conservative care Informed consent was obtained after describing risks and benefits of the procedure with the patient. This includes bleeding, bruising, infection, excessive weakness, or medication side effects. A REMS form is on file and signed. [id number]: 1" needle electrode Number of units per muscle recommend FCR 25 FDS 50 FDP 50 FPL 25 Biceps 100 Posterior tibialis 75 F. DL 25 FDB 25 Flexor hallucis brevis 25 All injections were done after obtaining appropriate EMG activity and after negative drawback for blood. The patient tolerated the procedure well. Post procedure instructions were given. A followup appointment was made.

## 2014-04-22 NOTE — Patient Instructions (Signed)

## 2014-06-06 ENCOUNTER — Encounter: Payer: BLUE CROSS/BLUE SHIELD | Attending: Physical Medicine & Rehabilitation

## 2014-06-06 ENCOUNTER — Ambulatory Visit: Payer: BC Managed Care – PPO | Admitting: Physical Medicine & Rehabilitation

## 2014-07-05 ENCOUNTER — Ambulatory Visit (HOSPITAL_BASED_OUTPATIENT_CLINIC_OR_DEPARTMENT_OTHER): Payer: BC Managed Care – PPO | Admitting: Physical Medicine & Rehabilitation

## 2014-07-05 ENCOUNTER — Encounter: Payer: BC Managed Care – PPO | Attending: Physical Medicine & Rehabilitation

## 2014-07-05 ENCOUNTER — Encounter: Payer: Self-pay | Admitting: Physical Medicine & Rehabilitation

## 2014-07-05 VITALS — BP 100/76 | HR 88 | Resp 14 | Ht 65.0 in | Wt 143.0 lb

## 2014-07-05 DIAGNOSIS — Z8673 Personal history of transient ischemic attack (TIA), and cerebral infarction without residual deficits: Secondary | ICD-10-CM | POA: Insufficient documentation

## 2014-07-05 DIAGNOSIS — G8110 Spastic hemiplegia affecting unspecified side: Secondary | ICD-10-CM | POA: Diagnosis not present

## 2014-07-05 NOTE — Progress Notes (Signed)
   Subjective:    Patient ID: Margaret Hess, female    DOB: 16-Apr-1966, 48 y.o.   MRN: [id number]  HPI  Pain Inventory Average Pain 3 Pain Right Now 7 My pain is constant, dull and aching  In the last 24 hours, has pain interfered with the following? General activity 6 Relation with others 7 Enjoyment of life 7 What TIME of day is your pain at its worst? daytime , evening and night Sleep (in general) Good  Pain is worse with: sitting and inactivity Pain improves with: heat/ice, therapy/exercise and medication Relief from Meds: 4  Mobility use a cane ability to climb steps?  yes  Function disabled: date disabled .  Neuro/Psych depression anxiety suicidal thoughts  Prior Studies Any changes since last visit?  yes  Physicians involved in your care Any changes since last visit?  yes   Family History  Problem Relation Age of Onset  . Multiple myeloma Mother   . Heart disease Father   . Hypertension Father    History   Social History  . Marital Status: Divorced    Spouse Name: N/A    Number of Children: N/A  . Years of Education: N/A   Social History Main Topics  . Smoking status: Never Smoker   . Smokeless tobacco: Never Used  . Alcohol Use: Yes     Comment: 1 mixed drink every 6 months  . Drug Use: No  . Sexual Activity: Yes    Birth Control/ Protection: Inserts   Other Topics Concern  . None   Social History Narrative   Past Surgical History  Procedure Laterality Date  . Wisdom tooth extraction     Past Medical History  Diagnosis Date  . Migraines   . Hypertension   . Diabetes mellitus   . Stroke   . CHF (congestive heart failure)    BP 100/76 mmHg  Pulse 88  Resp 14  Ht $R'5\' 5"'Lz$  (1.651 m)  Wt 143 lb (64.864 kg)  BMI 23.80 kg/m2  SpO2 97%  Opioid Risk Score:   Fall Risk Score: Low Fall Risk (0-5 points)   Review of Systems     Objective:   Physical Exam        Assessment & Plan:

## 2014-07-05 NOTE — Progress Notes (Signed)
   Subjective:    Patient ID: Margaret Hess, female    DOB: September 20, 1965, 48 y.o.   MRN: [id number]  HPI Botox Injection for spasticity using needle EMG guidance  Performed Aug 21,2015 FCR 25  FDS 50  FDP 50  FPL 25   Biceps 100  Posterior tibialis 75  F. DL 25  FDB 25  Flexor hallucis brevis 25  All injections were done after obtaining appropriate EMG activity and after negative drawback for blood. The patient tolerated the procedure well. Post procedure instructions were given. A followup appointment was made.   Pleased with arm results Doesn't think the toe curling or the foot inversion was much improved Review of Systems     Objective:   Physical Exam Ashworth grade 3 spasticity in the left biceps wrist flexor finger flexors thumb flexor  Ashworth grade 3 spasticity in the foot inverters and plantar flexors, Hyperactive Babinski left EHL Toe curling with pressure on the plantar surface of foot  Mood and affect appropriate Increased quad tone has 4 minus strength 3 minus left deltoid, trace bicep tricep inhibited by tone, trace finger flexor extensor inhibited by tone       Assessment & Plan:  1.  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 Botox helped the hand and wrist flexors the most. Biceps was helpful however this is less bothersome. We'll not inject biceps next visit Given the poor results in the left lower extremity, will increase dose by 100 units. Will increase FDL to 50 F DB to 50 Flex  Hallucis brevis to 50, continue posterior tibialis 75, EHL 25  FCR 25 FDS 50 FDP 50 FPL 25

## 2014-07-05 NOTE — Patient Instructions (Addendum)
Discussed Changes in Botox  Discussed substituting Lyrica for gabapentin  Discussed possible surgical referral for tendon lengthening procedures at  [facility]

## 2014-08-01 ENCOUNTER — Encounter: Payer: Self-pay | Admitting: Physical Medicine & Rehabilitation

## 2014-08-01 ENCOUNTER — Ambulatory Visit (HOSPITAL_BASED_OUTPATIENT_CLINIC_OR_DEPARTMENT_OTHER): Payer: BC Managed Care – PPO | Admitting: Physical Medicine & Rehabilitation

## 2014-08-01 VITALS — BP 118/78 | HR 80 | Resp 14 | Wt 138.0 lb

## 2014-08-01 DIAGNOSIS — G8110 Spastic hemiplegia affecting unspecified side: Secondary | ICD-10-CM

## 2014-08-01 NOTE — Patient Instructions (Signed)

## 2014-08-01 NOTE — Progress Notes (Signed)
Botox Injection for spasticity using needle EMG guidance  Dilution: 50 Units/ml Indication: Severe spasticity which interferes with ADL,mobility and/or  hygiene and is unresponsive to medication management and other conservative care Informed consent was obtained after describing risks and benefits of the procedure with the patient. This includes bleeding, bruising, infection, excessive weakness, or medication side effects. A REMS form is on file and signed. Needle: 25g 2" needle electrode Number of units per muscle  FCR25  FDS50 FDP50 FPL25  FDL 50  FDB 50 Leg FHB 50- medial prox to met head Post Tib 75 EHL 25 over fib  All injections were done after obtaining appropriate EMG activity and after negative drawback for blood. The patient tolerated the procedure well. Post procedure instructions were given. A followup appointment was made.    Follow-up in 6 weeks, if suboptimal result consider ultrasound guidance for next injection

## 2014-08-05 ENCOUNTER — Ambulatory Visit: Payer: BC Managed Care – PPO | Admitting: Physical Medicine & Rehabilitation

## 2014-08-11 ENCOUNTER — Encounter (HOSPITAL_COMMUNITY): Payer: Self-pay | Admitting: Internal Medicine

## 2014-08-29 DIAGNOSIS — F319 Bipolar disorder, unspecified: Secondary | ICD-10-CM | POA: Insufficient documentation

## 2014-08-29 HISTORY — DX: Bipolar disorder, unspecified: F31.9

## 2014-09-12 ENCOUNTER — Encounter: Payer: BLUE CROSS/BLUE SHIELD | Attending: Physical Medicine & Rehabilitation

## 2014-09-12 ENCOUNTER — Ambulatory Visit (HOSPITAL_BASED_OUTPATIENT_CLINIC_OR_DEPARTMENT_OTHER): Payer: BLUE CROSS/BLUE SHIELD | Admitting: Physical Medicine & Rehabilitation

## 2014-09-12 ENCOUNTER — Encounter: Payer: Self-pay | Admitting: Physical Medicine & Rehabilitation

## 2014-09-12 DIAGNOSIS — G8110 Spastic hemiplegia affecting unspecified side: Secondary | ICD-10-CM

## 2014-09-12 DIAGNOSIS — Z8673 Personal history of transient ischemic attack (TIA), and cerebral infarction without residual deficits: Secondary | ICD-10-CM | POA: Insufficient documentation

## 2014-09-12 NOTE — Patient Instructions (Signed)
Botox next visit  

## 2014-09-12 NOTE — Progress Notes (Signed)
Subjective:    Patient ID: Margaret Hess, female    DOB: [birth year], 49 y.o.   MRN: [id number]  HPI Nov 30th 2015 FCR25  FDS50  FDP50  FPL25  FDL 50  FDB 50 Leg  FHB 50- medial prox to met head  Post Tib 75  EHL 25 over fib  Forgets to wear brace Left hand Good benefit from injections Goes to gym in [place], special program for CVA pts Walking without  Cane indoors Aphasic Pain Inventory Average Pain 5 Pain Right Now 4 My pain is constant, burning, dull and aching  In the last 24 hours, has pain interfered with the following? General activity 5 Relation with others 5 Enjoyment of life 7 What TIME of day is your pain at its worst? evening Sleep (in general) Good  Pain is worse with: unsure Pain improves with: therapy/exercise and medication Relief from Meds: 4  Mobility use a cane ability to climb steps?  yes do you drive?  yes  Function disabled: date disabled .  Neuro/Psych weakness trouble walking depression anxiety  Prior Studies Any changes since last visit?  no  Physicians involved in your care Any changes since last visit?  no   BP 98/66 P 85 R 14  O2  99 %  Family History  Problem Relation Age of Onset  . Multiple myeloma Mother   . Heart disease Father   . Hypertension Father    History   Social History  . Marital Status: Divorced    Spouse Name: N/A    Number of Children: N/A  . Years of Education: N/A   Social History Main Topics  . Smoking status: Never Smoker   . Smokeless tobacco: Never Used  . Alcohol Use: Yes     Comment: 1 mixed drink every 6 months  . Drug Use: No  . Sexual Activity: Yes    Birth Control/ Protection: Inserts   Other Topics Concern  . None   Social History Narrative   Past Surgical History  Procedure Laterality Date  . Wisdom tooth extraction    . Left and right heart catheterization with coronary angiogram N/A 06/16/2012    Procedure: LEFT AND RIGHT HEART CATHETERIZATION WITH CORONARY  ANGIOGRAM;  Surgeon: Jolaine Artist, MD;  Location: [facility] CATH LAB;  Service: Cardiovascular;  Laterality: N/A;   Past Medical History  Diagnosis Date  . Migraines   . Hypertension   . Diabetes mellitus   . Stroke   . CHF (congestive heart failure)    There were no vitals taken for this visit.  Opioid Risk Score:   Fall Risk Score: Low Fall Risk (0-5 points)  Review of Systems  HENT: Negative.   Eyes: Negative.   Respiratory: Negative.   Cardiovascular: Negative.   Gastrointestinal: Negative.   Endocrine: Negative.   Genitourinary: Negative.   Musculoskeletal: Positive for myalgias.       Leg pain  Skin: Negative.   Allergic/Immunologic: Negative.   Neurological: Positive for weakness.       Trouble walking  Hematological: Negative.   Psychiatric/Behavioral: Positive for dysphoric mood. The patient is nervous/anxious.        Objective:   Physical Exam  MAS L Thumb IP 3 FDS,FDP 1 MCP 3 Ankle invertor 3 EHL 3 No toe curling!  Gait- no device Sl tendency towards knee recurvatum Motor 4/5 KE,HF, trace ankle DF    Assessment & Plan:  1.  Left spastic hemi- Overall improved post Botox Would not  do EHL next time, Increase to 50 in FPL

## 2014-10-03 ENCOUNTER — Emergency Department (HOSPITAL_BASED_OUTPATIENT_CLINIC_OR_DEPARTMENT_OTHER)
Admission: EM | Admit: 2014-10-03 | Discharge: 2014-10-03 | Disposition: A | Discharge status: Home / Self Care | Payer: BC Managed Care – PPO | Attending: Emergency Medicine | Admitting: Emergency Medicine

## 2014-10-03 ENCOUNTER — Encounter (HOSPITAL_BASED_OUTPATIENT_CLINIC_OR_DEPARTMENT_OTHER): Payer: Self-pay

## 2014-10-03 DIAGNOSIS — R42 Dizziness and giddiness: Secondary | ICD-10-CM | POA: Insufficient documentation

## 2014-10-03 DIAGNOSIS — R5383 Other fatigue: Secondary | ICD-10-CM | POA: Diagnosis not present

## 2014-10-03 DIAGNOSIS — G43909 Migraine, unspecified, not intractable, without status migrainosus: Secondary | ICD-10-CM | POA: Diagnosis not present

## 2014-10-03 DIAGNOSIS — Z7982 Long term (current) use of aspirin: Secondary | ICD-10-CM | POA: Insufficient documentation

## 2014-10-03 DIAGNOSIS — Z9889 Other specified postprocedural states: Secondary | ICD-10-CM | POA: Insufficient documentation

## 2014-10-03 DIAGNOSIS — Z880 Allergy status to penicillin: Secondary | ICD-10-CM | POA: Insufficient documentation

## 2014-10-03 DIAGNOSIS — I10 Essential (primary) hypertension: Secondary | ICD-10-CM | POA: Insufficient documentation

## 2014-10-03 DIAGNOSIS — E119 Type 2 diabetes mellitus without complications: Secondary | ICD-10-CM | POA: Diagnosis not present

## 2014-10-03 DIAGNOSIS — Z3202 Encounter for pregnancy test, result negative: Secondary | ICD-10-CM | POA: Insufficient documentation

## 2014-10-03 DIAGNOSIS — I509 Heart failure, unspecified: Secondary | ICD-10-CM | POA: Insufficient documentation

## 2014-10-03 DIAGNOSIS — Z79899 Other long term (current) drug therapy: Secondary | ICD-10-CM | POA: Diagnosis not present

## 2014-10-03 DIAGNOSIS — R531 Weakness: Secondary | ICD-10-CM

## 2014-10-03 DIAGNOSIS — Z8673 Personal history of transient ischemic attack (TIA), and cerebral infarction without residual deficits: Secondary | ICD-10-CM | POA: Diagnosis not present

## 2014-10-03 DIAGNOSIS — M6281 Muscle weakness (generalized): Secondary | ICD-10-CM | POA: Diagnosis not present

## 2014-10-03 DIAGNOSIS — R4789 Other speech disturbances: Secondary | ICD-10-CM | POA: Insufficient documentation

## 2014-10-03 LAB — COMPREHENSIVE METABOLIC PANEL
ALT: 11 U/L (ref 0–35)
ANION GAP: 4 — AB (ref 5–15)
AST: 21 U/L (ref 0–37)
Albumin: 3.7 g/dL (ref 3.5–5.2)
Alkaline Phosphatase: 44 U/L (ref 39–117)
BUN: 20 mg/dL (ref 6–23)
CO2: 27 mmol/L (ref 19–32)
CREATININE: 0.71 mg/dL (ref 0.50–1.10)
Calcium: 8.7 mg/dL (ref 8.4–10.5)
Chloride: 106 mmol/L (ref 96–112)
GFR calc Af Amer: >90 mL/min (ref >90)
GFR calc non Af Amer: >90 mL/min (ref >90)
Glucose, Bld: 107 mg/dL — ABNORMAL HIGH (ref 70–99)
Potassium: 4.1 mmol/L (ref 3.5–5.1)
Sodium: 137 mmol/L (ref 135–145)
TOTAL PROTEIN: 6.7 g/dL (ref 6.0–8.3)
Total Bilirubin: 0.3 mg/dL (ref 0.3–1.2)

## 2014-10-03 LAB — CBC
HCT: 32.9 % — ABNORMAL LOW (ref 36.0–46.0)
HEMOGLOBIN: 10.8 g/dL — AB (ref 12.0–15.0)
MCH: 30.9 pg (ref 26.0–34.0)
MCHC: 32.8 g/dL (ref 30.0–36.0)
MCV: 94.3 fL (ref 78.0–100.0)
PLATELETS: 374 10*3/uL (ref 150–400)
RBC: 3.49 MIL/uL — AB (ref 3.87–5.11)
RDW: 11.7 % (ref 11.5–15.5)
WBC: 8.9 10*3/uL (ref 4.0–10.5)

## 2014-10-03 LAB — PREGNANCY, URINE: Preg Test, Ur: NEGATIVE

## 2014-10-03 LAB — URINALYSIS, ROUTINE W REFLEX MICROSCOPIC
Bilirubin Urine: NEGATIVE
Glucose, UA: NEGATIVE mg/dL
HGB URINE DIPSTICK: NEGATIVE
Ketones, ur: NEGATIVE mg/dL
Leukocytes, UA: NEGATIVE
NITRITE: NEGATIVE
PH: 7 (ref 5.0–8.0)
PROTEIN: NEGATIVE mg/dL
SPECIFIC GRAVITY, URINE: 1.022 (ref 1.005–1.030)
Urobilinogen, UA: 1 mg/dL (ref 0.0–1.0)

## 2014-10-03 LAB — DIFFERENTIAL
Basophils Absolute: 0 10*3/uL (ref 0.0–0.1)
Basophils Relative: 0 % (ref 0–1)
EOS PCT: 3 % (ref 0–5)
Eosinophils Absolute: 0.3 10*3/uL (ref 0.0–0.7)
LYMPHS ABS: 3 10*3/uL (ref 0.7–4.0)
Lymphocytes Relative: 34 % (ref 12–46)
Monocytes Absolute: 0.7 10*3/uL (ref 0.1–1.0)
Monocytes Relative: 8 % (ref 3–12)
Neutro Abs: 4.8 10*3/uL (ref 1.7–7.7)
Neutrophils Relative %: 55 % (ref 43–77)

## 2014-10-03 LAB — TROPONIN I: Troponin I: <0.03 ng/mL (ref <0.031)

## 2014-10-03 LAB — BRAIN NATRIURETIC PEPTIDE: B Natriuretic Peptide: 18.5 pg/mL (ref 0.0–100.0)

## 2014-10-03 NOTE — ED Provider Notes (Signed)
CSN: [id number]     Arrival date & time 10/03/14  1708 History   First MD Initiated Contact with Patient 10/03/14 1728     Chief Complaint  Patient presents with  . Dizziness     (Consider location/radiation/quality/duration/timing/severity/associated sxs/prior Treatment) HPI Comments: Patients with history of stroke with residual significant left-sided weakness, aphasia, history of nonischemic cardiomyopathy with congestive heart failure -- presents with complaint of generalized weakness and dizziness. This dizziness is more of a disequilibrium than lightheadedness or vertigo. Patient is able to ambulate but she states that she has been leaning more heavily on her walker. She has not fallen. She does not have a headache, vision change or loss, neck pain, new weakness in her arms or legs. No chest pain or cough or shortness of breath. No abdominal pain or urinary symptoms. Patient states that she no longer takes Coumadin and is only on aspirin. Patient was told to come to the emergency department for evaluation due to her complex medical history. Patient is concerned about her heart.  The history is provided by the patient and medical records.    Past Medical History  Diagnosis Date  . Migraines   . Hypertension   . Diabetes mellitus   . Stroke   . CHF (congestive heart failure)    Past Surgical History  Procedure Laterality Date  . Wisdom tooth extraction    . Left and right heart catheterization with coronary angiogram N/A 06/16/2012    Procedure: LEFT AND RIGHT HEART CATHETERIZATION WITH CORONARY ANGIOGRAM;  Surgeon: Jolaine Artist, MD;  Location: [facility] CATH LAB;  Service: Cardiovascular;  Laterality: N/A;   Family History  Problem Relation Age of Onset  . Multiple myeloma Mother   . Heart disease Father   . Hypertension Father    History  Substance Use Topics  . Smoking status: Never Smoker   . Smokeless tobacco: Never Used  . Alcohol Use: Yes     Comment: 1 mixed drink  every 6 months   OB History    No data available     Review of Systems  Constitutional: Positive for fatigue. Negative for fever.  HENT: Negative for rhinorrhea and sore throat.   Eyes: Negative for redness.  Respiratory: Negative for cough and shortness of breath.   Cardiovascular: Negative for chest pain and leg swelling.  Gastrointestinal: Negative for nausea, vomiting, abdominal pain and diarrhea.  Genitourinary: Negative for dysuria.  Musculoskeletal: Negative for myalgias.  Skin: Negative for rash.  Neurological: Positive for dizziness and speech difficulty (baseline). Negative for syncope, facial asymmetry, weakness, light-headedness, numbness and headaches.    Allergies  Penicillins  Home Medications   Prior to Admission medications   Medication Sig Start Date End Date Taking? Authorizing Provider  acetaminophen (TYLENOL) 500 MG tablet Take 1,000 mg by mouth every 6 (six) hours as needed for pain.     Historical Provider, MD  ALPRAZolam Duanne Moron) 0.5 MG tablet Take 0.5 mg by mouth at bedtime.    Historical Provider, MD  aspirin EC 81 MG tablet Take 1 tablet (81 mg total) by mouth daily. 03/25/14   Larey Dresser, MD  betamethasone dipropionate (DIPROLENE) 0.05 % cream  06/09/13   Historical Provider, MD  carvedilol (COREG) 3.125 MG tablet TAKE 1 TABLET BY MOUTH TWICE DAILY WITH A MEAL 04/18/14   Jolaine Artist, MD  chlorzoxazone (PARAFON) 500 MG tablet Take 500 mg by mouth 3 (three) times daily as needed for muscle spasms.    Historical Provider,  MD  citalopram (CELEXA) 20 MG tablet Take 40 mg by mouth every morning.     Historical Provider, MD  clobetasol (TEMOVATE) 0.05 % external solution  06/10/13   Historical Provider, MD  DULoxetine (CYMBALTA) 60 MG capsule Take 60 mg by mouth daily.    Historical Provider, MD  furosemide (LASIX) 20 MG tablet Take 20 mg by mouth.    Historical Provider, MD  gabapentin (NEURONTIN) 800 MG tablet Take 800-1,600 mg by mouth 3 (three)  times daily. Takes 1 tablet in morning, 1 tablet at lunch, and 2 tablets at bedtime    Historical Provider, MD  HYDROcodone-acetaminophen (NORCO/VICODIN) 5-325 MG per tablet Take 1 tablet by mouth 2 (two) times daily.     Historical Provider, MD  Linaclotide Rolan Lipa) 145 MCG CAPS capsule Take 1 capsule by mouth daily. 02/04/14   Historical Provider, MD  lisinopril (PRINIVIL,ZESTRIL) 5 MG tablet Take 1 tablet (5 mg total) by mouth daily. MUST HAVE FOLLOW UP APPOINTMENT FOR FURTHER REFILLS 02/22/14   Jolaine Artist, MD  metFORMIN (GLUCOPHAGE) 500 MG tablet Take 500 mg by mouth daily with breakfast.     Historical Provider, MD  mupirocin cream (BACTROBAN) 2 %  06/08/13   Historical Provider, MD  nitrofurantoin, macrocrystal-monohydrate, (MACROBID) 100 MG capsule  08/23/13   Historical Provider, MD  ondansetron (ZOFRAN-ODT) 8 MG disintegrating tablet  08/27/13   Historical Provider, MD  ONE TOUCH ULTRA TEST test strip  03/25/13   Historical Provider, MD  pantoprazole (PROTONIX) 40 MG tablet Take 40 mg by mouth every morning.     Historical Provider, MD  phenazopyridine (PYRIDIUM) 100 MG tablet  08/23/13   Historical Provider, MD  spironolactone (ALDACTONE) 25 MG tablet Take 25 mg by mouth daily.  02/25/13   Historical Provider, MD  tamsulosin (FLOMAX) 0.4 MG CAPS capsule  08/27/13   Historical Provider, MD  tiZANidine (ZANAFLEX) 4 MG tablet Take 1 tablet (4 mg total) by mouth at bedtime. 02/21/14   Charlett Blake, MD   BP 112/58 mmHg  Pulse 87  Temp(Src) 99.3 F (37.4 C) (Oral)  Resp 18  Ht _0  (1.651 m)  Wt 141 lb (63.957 kg)  BMI 23.46 kg/m2  SpO2 100%   Physical Exam  Constitutional: She is oriented to person, place, and time. She appears well-developed and well-nourished.  HENT:  Head: Normocephalic and atraumatic.  Right Ear: Tympanic membrane, external ear and ear canal normal.  Left Ear: Tympanic membrane, external ear and ear canal normal.  Nose: Nose normal.  Mouth/Throat:  Uvula is midline, oropharynx is clear and moist and mucous membranes are normal.  Eyes: Conjunctivae, EOM and lids are normal. Pupils are equal, round, and reactive to light. Right eye exhibits no nystagmus. Left eye exhibits no nystagmus.  Neck: Normal range of motion. Neck supple.  Cardiovascular: Normal rate and regular rhythm.   Pulmonary/Chest: Effort normal and breath sounds normal.  Abdominal: Soft. There is no tenderness.  Musculoskeletal:       Cervical back: She exhibits normal range of motion, no tenderness and no bony tenderness.  Neurological: She is alert and oriented to person, place, and time. She has normal strength. No cranial nerve deficit or sensory deficit. She displays a negative Romberg sign. Coordination and gait normal. GCS eye subscore is 4. GCS verbal subscore is 5. GCS motor subscore is 6.  Patient with slow broken speech at baseline. L arm and L leg are very weak at baseline.   Skin: Skin is warm and  dry.  Psychiatric: She has a normal mood and affect.  Nursing note and vitals reviewed.   ED Course  Procedures (including critical care time) Labs Review Labs Reviewed  CBC - Abnormal; Notable for the following:    RBC 3.49 (*)    Hemoglobin 10.8 (*)    HCT 32.9 (*)    All other components within normal limits  COMPREHENSIVE METABOLIC PANEL - Abnormal; Notable for the following:    Glucose, Bld 107 (*)    Anion gap 4 (*)    All other components within normal limits  URINALYSIS, ROUTINE W REFLEX MICROSCOPIC - Abnormal; Notable for the following:    APPearance CLOUDY (*)    All other components within normal limits  DIFFERENTIAL  PREGNANCY, URINE  BRAIN NATRIURETIC PEPTIDE  TROPONIN I  I-STAT TROPOININ, ED    Imaging Review No results found.   EKG Interpretation   Date/Time:  Monday October 03 2014 17:37:14 EST Ventricular Rate:  92 PR Interval:  146 QRS Duration: 76 QT Interval:  360 QTC Calculation: 445 R Axis:   42 Text Interpretation:   Sinus rhythm with Premature atrial complexes  Otherwise normal ECG AGREE. NO STEMI. NO CHANGE Confirmed by Johnney Killian, MD,  Jeannie Done [phone]) on 10/03/2014 5:59:47 PM      5:31 PM Patient seen and examined. Work-up initiated. EKG reviewed.   Vital signs reviewed and are as follows: BP 112/58 mmHg  Pulse 87  Temp(Src) 99.3 F (37.4 C) (Oral)  Resp 18  Ht _0  (1.651 m)  Wt 141 lb (63.957 kg)  BMI 23.46 kg/m2  SpO2 100%   8:24 PM Patient discussed with and seen by Dr. Johnney Killian. Patient okay to go home.   Encourage cardiology follow-up and PCP follow-up.   Patient urged to return with worsening symptoms or other concerns. Patient verbalized understanding and agrees with plan.     MDM   Final diagnoses:  Dizziness  Generalized weakness   Patient with significant past medical history as above, presents with generalized weakness and dizziness. Patient does not have any focal neurological deficits. She is ambulatory at her baseline. No headache or fever. Workup is reassuring. No signs of worsening congestive heart failure. No signs of stroke. Do not feel that imaging of head is indicated at this time. Symptoms are not consistent with a posterior circulation stroke.  No dangerous or life-threatening conditions suspected or identified by history, physical exam, and by work-up. No indications for hospitalization identified.     Carlisle Cater, PA-C 10/03/14 2026  Charlesetta Shanks, MD 10/09/14 [phone]

## 2014-10-03 NOTE — Discharge Instructions (Signed)
Please read and follow all provided instructions.  Your diagnoses today include:  1. Dizziness   2. Generalized weakness     Tests performed today include:  Blood counts and electrolytes  Urine test - no infection  Blood test for heart muscle damage-was negative  Test for congestive heart failure - normal  Vital signs. See below for your results today.   Medications prescribed:   None  Take any prescribed medications only as directed.  Home care instructions:  Follow any educational materials contained in this packet.  Follow-up instructions: Please follow-up with your primary care provider in the next 3 days for further evaluation of your symptoms. Also call your cardiologist for follow-up tomorrow.   Return instructions:   Please return to the Emergency Department if you experience worsening symptoms.  Return if you have weakness in your arms or legs, slurred speech, trouble walking or talking, confusion, or trouble with your balance.  Return with chest pain or shortness of breath.  Please return if you have any other emergent concerns.  Additional Information:  Your vital signs today were: BP 114/67 mmHg   Pulse 74   Temp(Src) 99 F (37.2 C) (Oral)   Resp 18   Ht 5\' 5"  (1.651 m)   Wt 141 lb (63.957 kg)   BMI 23.46 kg/m2   SpO2 100% If your blood pressure (BP) was elevated above 135/85 this visit, please have this repeated by your doctor within one month. --------------

## 2014-10-03 NOTE — ED Notes (Signed)
Per EMS: Pt has had dizziness x 3 days. Went to the PMD, had EKG performed and was told to come be evaluated at ED.

## 2014-10-03 NOTE — ED Notes (Signed)
Pt reports generalized weakness and dizziness.

## 2014-10-31 ENCOUNTER — Ambulatory Visit: Payer: BLUE CROSS/BLUE SHIELD | Admitting: Physical Medicine & Rehabilitation

## 2014-11-18 ENCOUNTER — Ambulatory Visit (HOSPITAL_BASED_OUTPATIENT_CLINIC_OR_DEPARTMENT_OTHER): Payer: BC Managed Care – PPO | Admitting: Physical Medicine & Rehabilitation

## 2014-11-18 ENCOUNTER — Encounter: Payer: BC Managed Care – PPO | Attending: Physical Medicine & Rehabilitation

## 2014-11-18 ENCOUNTER — Encounter: Payer: Self-pay | Admitting: Physical Medicine & Rehabilitation

## 2014-11-18 VITALS — BP 124/88 | HR 68 | Resp 14

## 2014-11-18 DIAGNOSIS — G8110 Spastic hemiplegia affecting unspecified side: Secondary | ICD-10-CM

## 2014-11-18 DIAGNOSIS — Z8673 Personal history of transient ischemic attack (TIA), and cerebral infarction without residual deficits: Secondary | ICD-10-CM | POA: Diagnosis not present

## 2014-11-18 NOTE — Patient Instructions (Addendum)
You received a Botox injection today. You may experience soreness at the needle injection sites. Please call us if any of the injection sites turns red after a couple days or if there is any drainage. You may experience muscle weakness as a result of Botox. This would improve with time but can take several weeks to improve. The Botox should start working in about one week. The Botox usually last 3 months. The injection can be repeated every 3 months as needed.  Next injection is a musculocutaneous nerve block with phenol, this is like the phenol injection you had a new left tibial nerve which was behind the knee. It is a single injection but helps reduce biceps spasms

## 2014-11-18 NOTE — Progress Notes (Signed)
Botox Injection for spasticity using needle EMG guidance  Dilution: 50 Units/ml Indication: Severe spasticity which interferes with ADL,mobility and/or  hygiene and is unresponsive to medication management and other conservative care Informed consent was obtained after describing risks and benefits of the procedure with the patient. This includes bleeding, bruising, infection, excessive weakness, or medication side effects. A REMS form is on file and signed. Needle: 25g 2" needle electrode Number of units per muscle FCR50   FDS50   FCU25 FDP50   FPL25  FDL 50   FDB 50    FHB 50- medial prox to met head   Post Tib 75     All injections were done after obtaining appropriate EMG activity and after negative drawback for blood. The patient tolerated the procedure well. Post procedure instructions were given. A followup appointment was made.

## 2014-11-18 NOTE — Progress Notes (Signed)
Subjective:    Patient ID: Margaret Hess, female    DOB: 1965-12-28, 49 y.o.   MRN: [id number]  HPI Patient still complaining of some biceps spasticity We discussed her Botox injection and that she is receiving a maximum dose today Pain Inventory Average Pain 7 Pain Right Now 4 My pain is intermittent, dull and aching  In the last 24 hours, has pain interfered with the following? General activity 7 Relation with others 8 Enjoyment of life 8 What TIME of day is your pain at its worst? evening Sleep (in general) Good  Pain is worse with: walking and some activites Pain improves with: rest, heat/ice, therapy/exercise and medication Relief from Meds: 6  Mobility use a cane ability to climb steps?  yes do you drive?  no  Function disabled: date disabled .  Neuro/Psych weakness numbness tingling trouble walking depression anxiety  Prior Studies Any changes since last visit?  no  Physicians involved in your care Any changes since last visit?  no   Family History  Problem Relation Age of Onset  . Multiple myeloma Mother   . Heart disease Father   . Hypertension Father    History   Social History  . Marital Status: Divorced    Spouse Name: N/A  . Number of Children: N/A  . Years of Education: N/A   Social History Main Topics  . Smoking status: Never Smoker   . Smokeless tobacco: Never Used  . Alcohol Use: Yes     Comment: 1 mixed drink every 6 months  . Drug Use: No  . Sexual Activity: Yes    Birth Control/ Protection: Inserts   Other Topics Concern  . None   Social History Narrative   Past Surgical History  Procedure Laterality Date  . Wisdom tooth extraction    . Left and right heart catheterization with coronary angiogram N/A 06/16/2012    Procedure: LEFT AND RIGHT HEART CATHETERIZATION WITH CORONARY ANGIOGRAM;  Surgeon: Jolaine Artist, MD;  Location: [facility] CATH LAB;  Service: Cardiovascular;  Laterality: N/A;   Past Medical History    Diagnosis Date  . Migraines   . Hypertension   . Diabetes mellitus   . Stroke   . CHF (congestive heart failure)    BP 124/88 mmHg  Pulse 68  Resp 14  SpO2 98%  Opioid Risk Score:   Fall Risk Score: Moderate Fall Risk (6-13 points)`1  Depression screen PHQ 2/9  Depression screen PHQ 2/9 11/18/2014  Decreased Interest 2  Down, Depressed, Hopeless 1  PHQ - 2 Score 3  Altered sleeping 0  Tired, decreased energy 0  Change in appetite 0  Feeling bad or failure about yourself  1  Trouble concentrating 2  Moving slowly or fidgety/restless 3  Suicidal thoughts 1  PHQ-9 Score 10     Review of Systems  HENT: Negative.   Eyes: Negative.   Respiratory: Negative.   Cardiovascular: Negative.   Gastrointestinal: Positive for diarrhea and constipation.  Endocrine: Negative.        High blood sugar  Genitourinary: Negative.   Musculoskeletal:       Leg pain  Skin: Negative.   Allergic/Immunologic: Negative.   Neurological: Positive for dizziness (tingling, trouble walking, ), weakness and numbness.  Hematological: Negative.   Psychiatric/Behavioral: Positive for dysphoric mood. The patient is nervous/anxious.        Objective:   Physical Exam  Left biceps Ashworth grade 3      Assessment & Plan:  1. Left spastic hemiplegia as she is already at a maximum dose of Botox, biceps spasticity which does not respond to PT stretching or oral medication, recommend musky cutaneous nerve block with phenol next visit

## 2014-12-30 ENCOUNTER — Encounter: Payer: Self-pay | Admitting: Physical Medicine & Rehabilitation

## 2014-12-30 ENCOUNTER — Encounter: Payer: Medicare Other | Attending: Physical Medicine & Rehabilitation

## 2014-12-30 ENCOUNTER — Ambulatory Visit (HOSPITAL_BASED_OUTPATIENT_CLINIC_OR_DEPARTMENT_OTHER): Payer: Medicare Other | Admitting: Physical Medicine & Rehabilitation

## 2014-12-30 VITALS — BP 126/80 | HR 68 | Resp 14

## 2014-12-30 DIAGNOSIS — Z8673 Personal history of transient ischemic attack (TIA), and cerebral infarction without residual deficits: Secondary | ICD-10-CM | POA: Diagnosis not present

## 2014-12-30 DIAGNOSIS — G8110 Spastic hemiplegia affecting unspecified side: Secondary | ICD-10-CM | POA: Insufficient documentation

## 2014-12-30 NOTE — Progress Notes (Signed)
Subjective:    Patient ID: Margaret Hess, female    DOB: May 25, 1966, 49 y.o.   MRN: [id number]  HPI  Pain Inventory Average Pain 4 Pain Right Now 5 My pain is constant, dull and aching  In the last 24 hours, has pain interfered with the following? General activity 2 Relation with others 2 Enjoyment of life 2 What TIME of day is your pain at its worst? evening Sleep (in general) Good  Pain is worse with: inactivity Pain improves with: therapy/exercise, pacing activities and medication Relief from Meds: 5  Mobility use a cane how many minutes can you walk? 15 ability to climb steps?  no do you drive?  no  Function disabled: date disabled .  Neuro/Psych numbness trouble walking confusion depression anxiety  Prior Studies Any changes since last visit?  no  Physicians involved in your care Any changes since last visit?  no   Family History  Problem Relation Age of Onset  . Multiple myeloma Mother   . Heart disease Father   . Hypertension Father    History   Social History  . Marital Status: Divorced    Spouse Name: N/A  . Number of Children: N/A  . Years of Education: N/A   Social History Main Topics  . Smoking status: Never Smoker   . Smokeless tobacco: Never Used  . Alcohol Use: Yes     Comment: 1 mixed drink every 6 months  . Drug Use: No  . Sexual Activity: Yes    Birth Control/ Protection: Inserts   Other Topics Concern  . None   Social History Narrative   Past Surgical History  Procedure Laterality Date  . Wisdom tooth extraction    . Left and right heart catheterization with coronary angiogram N/A 06/16/2012    Procedure: LEFT AND RIGHT HEART CATHETERIZATION WITH CORONARY ANGIOGRAM;  Surgeon: Jolaine Artist, MD;  Location: [facility] CATH LAB;  Service: Cardiovascular;  Laterality: N/A;   Past Medical History  Diagnosis Date  . Migraines   . Hypertension   . Diabetes mellitus   . Stroke   . CHF (congestive heart failure)     BP 126/80 mmHg  Pulse 68  Resp 14  SpO2 99%  Opioid Risk Score:   Fall Risk Score:  `1  Depression screen PHQ 2/9  Depression screen PHQ 2/9 11/18/2014  Decreased Interest 2  Down, Depressed, Hopeless 1  PHQ - 2 Score 3  Altered sleeping 0  Tired, decreased energy 0  Change in appetite 0  Feeling bad or failure about yourself  1  Trouble concentrating 2  Moving slowly or fidgety/restless 3  Suicidal thoughts 1  PHQ-9 Score 10     Review of Systems  Constitutional: Negative.   HENT: Negative.   Eyes: Negative.   Respiratory: Negative.   Cardiovascular: Negative.   Gastrointestinal: Negative.   Endocrine: Negative.   Genitourinary: Negative.   Musculoskeletal:       Left leg pain  Skin: Negative.   Allergic/Immunologic: Negative.   Neurological: Positive for numbness.       Trouble walking  Hematological: Negative.   Psychiatric/Behavioral: Positive for confusion and dysphoric mood. The patient is nervous/anxious.        Objective:   Physical Exam        Assessment & Plan:  Phenol neurolysis of the Left musculo- cutaneous nerve  Indication: Severe spasticity in the arm flexor muscles which is not responding to medical management and other conservative care and  interfering with functional use and hygiene.  Informed consent was obtained after describing the risks and benefits of the procedure with the patient this includes bleeding bruising and infection as well as medication side effects. The patient elected to proceed and has given written consent. Patient placed in a supine position on the exam table. External DC stimulation was applied to the axilla using a nerve stimulator. Arm flexion twitch was obtained. The axillary region was prepped with Betadine and then entered with a 22-gauge 40 mm needle electrode under electrical stimulation guidance. Arm flexion which was obtained and confirmed. Then 4 cc of 5% phenol were injected. The patient tolerated  procedure well. Post procedure instructions and followup visit were given.

## 2014-12-30 NOTE — Patient Instructions (Signed)
Left musculocutaneous nerve block with phenol today. This medication may start taking the fact today however full effect will be at about one week Duration of the effect is 3-6 months Side effects of medication may include right heel numbness or burning. Call if you have burning pain so we can recommend any medication for that.

## 2015-02-09 ENCOUNTER — Encounter: Payer: Medicare Other | Attending: Physical Medicine & Rehabilitation

## 2015-02-09 ENCOUNTER — Ambulatory Visit: Payer: Medicare Other | Admitting: Physical Medicine & Rehabilitation

## 2015-02-09 DIAGNOSIS — G8110 Spastic hemiplegia affecting unspecified side: Secondary | ICD-10-CM | POA: Insufficient documentation

## 2015-02-09 DIAGNOSIS — Z8673 Personal history of transient ischemic attack (TIA), and cerebral infarction without residual deficits: Secondary | ICD-10-CM | POA: Insufficient documentation

## 2015-02-16 ENCOUNTER — Ambulatory Visit (HOSPITAL_BASED_OUTPATIENT_CLINIC_OR_DEPARTMENT_OTHER): Payer: Medicare Other | Admitting: Physical Medicine & Rehabilitation

## 2015-02-16 ENCOUNTER — Encounter: Payer: Self-pay | Admitting: Physical Medicine & Rehabilitation

## 2015-02-16 VITALS — BP 117/80 | HR 90 | Resp 14

## 2015-02-16 DIAGNOSIS — Z8673 Personal history of transient ischemic attack (TIA), and cerebral infarction without residual deficits: Secondary | ICD-10-CM | POA: Diagnosis not present

## 2015-02-16 DIAGNOSIS — G8114 Spastic hemiplegia affecting left nondominant side: Secondary | ICD-10-CM

## 2015-02-16 DIAGNOSIS — G8110 Spastic hemiplegia affecting unspecified side: Secondary | ICD-10-CM | POA: Diagnosis not present

## 2015-02-16 NOTE — Progress Notes (Signed)
Subjective:    Patient ID: Margaret Hess, female    DOB: 11/02/1965, 48 y.o.   MRN: [id number]  HPI   Pain Inventory Average Pain 8 Pain Right Now 8 My pain is stabbing and aching  In the last 24 hours, has pain interfered with the following? General activity 8 Relation with others 9 Enjoyment of life 10 What TIME of day is your pain at its worst? daytime, evening Sleep (in general) Fair  Pain is worse with: walking, bending, sitting, standing and some activites Pain improves with: rest and medication Relief from Meds: 3  Mobility walk without assistance walk with assistance use a cane how many minutes can you walk? 5-10 ability to climb steps?  no do you drive?  yes Do you have any goals in this area?  yes  Function employed # of hrs/week . disabled: date disabled .  Neuro/Psych weakness numbness tingling trouble walking spasms dizziness confusion depression anxiety loss of taste or smell  Prior Studies New Visit  Physicians involved in your care New Visit   Family History  Problem Relation Age of Onset  . Multiple myeloma Mother   . Heart disease Father   . Hypertension Father    History   Social History  . Marital Status: Divorced    Spouse Name: N/A  . Number of Children: N/A  . Years of Education: N/A   Social History Main Topics  . Smoking status: Never Smoker   . Smokeless tobacco: Never Used  . Alcohol Use: Yes     Comment: 1 mixed drink every 6 months  . Drug Use: No  . Sexual Activity: Yes    Birth Control/ Protection: Inserts   Other Topics Concern  . None   Social History Narrative   Past Surgical History  Procedure Laterality Date  . Wisdom tooth extraction    . Left and right heart catheterization with coronary angiogram N/A 06/16/2012    Procedure: LEFT AND RIGHT HEART CATHETERIZATION WITH CORONARY ANGIOGRAM;  Surgeon: Daniel R Bensimhon, MD;  Location: [facility] CATH LAB;  Service: Cardiovascular;  Laterality: N/A;    Past Medical History  Diagnosis Date  . Migraines   . Hypertension   . Diabetes mellitus   . Stroke   . CHF (congestive heart failure)    BP 117/80 mmHg  Pulse 90  Resp 14  SpO2 99%  Opioid Risk Score:   Fall Risk Score:  `1  Depression screen PHQ 2/9  Depression screen PHQ 2/9 11/18/2014  Decreased Interest 2  Down, Depressed, Hopeless 1  PHQ - 2 Score 3  Altered sleeping 0  Tired, decreased energy 0  Change in appetite 0  Feeling bad or failure about yourself  1  Trouble concentrating 2  Moving slowly or fidgety/restless 3  Suicidal thoughts 1  PHQ-9 Score 10     Review of Systems  Constitutional:       Loss of taste/smell  Musculoskeletal: Positive for gait problem.  Neurological: Positive for weakness and numbness.       Tingling Spasms   Psychiatric/Behavioral: Positive for confusion and dysphoric mood. The patient is nervous/anxious.   All other systems reviewed and are negative.      Objective:   Physical Exam        Assessment & Plan:   

## 2015-02-16 NOTE — Patient Instructions (Signed)

## 2015-02-16 NOTE — Progress Notes (Signed)
   Subjective:    Patient ID: Margaret Hess, female    DOB: [birth year], 49 y.o.   MRN: [id number]  HPI    Review of Systems     Objective:   Physical Exam        Assessment & Plan:  Botox Injection for spasticity using needle EMG guidance  Dilution: 50 Units/ml Indication: Severe spasticity which interferes with ADL,mobility and/or  hygiene and is unresponsive to medication management and other conservative care Informed consent was obtained after describing risks and benefits of the procedure with the patient. This includes bleeding, bruising, infection, excessive weakness, or medication side effects. A REMS form is on file and signed. Needle: 30g 1" needle electrode Number of units per muscle FCR50   FDS50   FCU25 FDP50   FPL25  FDL 50   FDB 50    FHB 50- medial prox to met head   Post Tib 75   All injections were done after obtaining appropriate EMG activity and after negative drawback for blood. The patient tolerated the procedure well. Post procedure instructions were given. A followup appointment was made.

## 2015-03-30 ENCOUNTER — Encounter: Payer: Medicare Other | Attending: Physical Medicine & Rehabilitation

## 2015-03-30 ENCOUNTER — Encounter: Payer: Self-pay | Admitting: Physical Medicine & Rehabilitation

## 2015-03-30 ENCOUNTER — Ambulatory Visit (HOSPITAL_BASED_OUTPATIENT_CLINIC_OR_DEPARTMENT_OTHER): Payer: Medicare Other | Admitting: Physical Medicine & Rehabilitation

## 2015-03-30 VITALS — BP 115/61 | HR 89 | Resp 16

## 2015-03-30 DIAGNOSIS — G8110 Spastic hemiplegia affecting unspecified side: Secondary | ICD-10-CM | POA: Diagnosis present

## 2015-03-30 DIAGNOSIS — M7062 Trochanteric bursitis, left hip: Secondary | ICD-10-CM | POA: Diagnosis not present

## 2015-03-30 DIAGNOSIS — Z8673 Personal history of transient ischemic attack (TIA), and cerebral infarction without residual deficits: Secondary | ICD-10-CM | POA: Insufficient documentation

## 2015-03-30 DIAGNOSIS — M545 Low back pain: Secondary | ICD-10-CM

## 2015-03-30 DIAGNOSIS — M47816 Spondylosis without myelopathy or radiculopathy, lumbar region: Secondary | ICD-10-CM

## 2015-03-30 NOTE — Progress Notes (Signed)
Subjective:    Patient ID: Margaret Hess, female    DOB: [birth year], 49 y.o.   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 The patient has gone through inpatient rib rotation as well as outpatient rehabilitation and has had follow-up at this office mainly for spastic hemiplegia.  She has a new problem with increasing left-sided low back pain and left-sided buttock pain. This is been going on for several months. She has been focused on her stroke related problems but this has become more prominent with time. Low back pain as well as left-sided buttock pain and left lateral hip pain.  Pain increases with ambulation. Patient has chronic numbness on the left side related to her stroke. Nothing increased from the usual. No new bowel or bladder complaints. No trauma to the low back. She has had no prior surgeries in the low back area Or left hip   Ambulates with an AFO Bhavana says she is here for a hip injection (?)  Botulinum toxin injection 02/16/2015 with improvements in upper extremity and lower from a spasticity patient is quite happy with the Botox treatments thus far FCR50   FDS50   FCU25 FDP50   FPL25  FDL 50   FDB 50    FHB 50- medial prox to met head   Post Tib 75     Pain Inventory Average Pain 5 Pain Right Now 3 My pain is constant, burning, dull, tingling and aching  In the last 24 hours, has pain interfered with the following? General activity 5 Relation with others 3 Enjoyment of life 7 What TIME of day is your pain at its worst? morning and evening Sleep (in general) Good  Pain is worse with: sitting and standing Pain improves with: rest, heat/ice, therapy/exercise and medication Relief from Meds: 6  Mobility walk without assistance use a cane Do you have any goals in this area?  no  Function Do you have any goals in this area?   no  Neuro/Psych depression anxiety  Prior Studies Any changes since last visit?  no  Physicians involved in your care Any changes since last visit?  no   Family History  Problem Relation Age of Onset  . Multiple myeloma Mother   . Heart disease Father   . Hypertension Father    History   Social History  . Marital Status: Divorced    Spouse Name: N/A  . Number of Children: N/A  . Years of Education: N/A   Social History Main Topics  . Smoking status: Never Smoker   . Smokeless tobacco: Never Used  . Alcohol Use: Yes     Comment: 1 mixed drink every 6 months  . Drug Use: No  . Sexual Activity: Yes    Birth Control/ Protection: Inserts   Other Topics Concern  . None   Social History Narrative   Past Surgical History  Procedure Laterality Date  . Wisdom tooth extraction    . Left and right heart catheterization with coronary angiogram N/A 06/16/2012    Procedure: LEFT AND RIGHT HEART CATHETERIZATION WITH CORONARY ANGIOGRAM;  Surgeon: Jolaine Artist, MD;  Location: [facility] CATH LAB;  Service: Cardiovascular;  Laterality: N/A;   Past Medical History  Diagnosis Date  . Migraines   . Hypertension   . Diabetes mellitus   . Stroke   . CHF (congestive heart failure)    BP 115/61 mmHg  Pulse 89  Resp 16  SpO2 97%  Opioid Risk Score:   Fall Risk Score:  `1  Depression screen PHQ 2/9  Depression screen [facility] 2/9 03/30/2015 11/18/2014  Decreased Interest 2 2  Down, Depressed, Hopeless 1 1  PHQ - 2 Score 3 3  Altered sleeping - 0  Tired, decreased energy - 0  Change in appetite - 0  Feeling bad or failure about yourself  - 1  Trouble concentrating - 2  Moving slowly or fidgety/restless - 3  Suicidal thoughts - 1  PHQ-9 Score - 10     Review of Systems  Psychiatric/Behavioral: Positive for dysphoric mood. The patient is nervous/anxious.   All other systems reviewed and are negative.      Objective:   Physical Exam  Musculoskeletal:       Right hip:  Normal.       Left hip: She exhibits decreased range of motion and bony tenderness.       Lumbar back: She exhibits decreased range of motion, tenderness, bony tenderness and pain. She exhibits no edema, no deformity and no spasm.  Negative straight leg raising in the left lower extremity  Gait: Ambulates with a quad cane as well as a left AFO  Neurological: She is alert.  Aphasia    Psychiatric: She has a normal mood and affect.  Nursing note and vitals reviewed.   Motor strength is 3 minus in the left deltoid, biceps, triceps, grip 4 minus in the left knee extensor 3 minus in the hip flexor Sensation reduced in the left upper and left lower extremity  Tenderness to palpation in the left lumbar paraspinal muscles as well as left PSIS as well as left gluteus medius and left greater trochanter. No pain with hip internal and external rotation on the left side      Assessment & Plan:  1. 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 Will repeat injection in 6 weeks same doses.  2. Low back pain this has started since her stroke. His primarily left-sided during ambulation. 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 Left trochanteric bursitis with lateral hip pain. She is not a good candidate for nonsteroidal anti-inflammatories given CVA history. We'll do injection with corticosteroid today  Left Trochanteric bursa injection  without ultrasound guidance  Indication Trochanteric bursitis. Exam has tenderness over the greater trochanter of the hip. Pain has not responded to conservative care such as exercise therapy and oral  medications. Pain interferes with sleep or with mobility Informed consent was obtained after describing risks and benefits of the procedure with the patient these include bleeding bruising and infection. Patient has signed written consent form. Patient placed in a lateral decubitus position with the affected hip superior. Point of maximal pain was palpated marked and prepped with Betadine and entered with a needle to bone contact. Needle slightly withdrawn then 44m of betamethasone with 4 cc 1% lidocaine were injected. Patient tolerated procedure well. Post procedure instructions given.

## 2015-03-30 NOTE — Patient Instructions (Signed)
Trochanteric Bursitis You have hip pain due to trochanteric bursitis. Bursitis means that the sack near the outside of the hip is filled with fluid and inflamed. This sack is made up of protective soft tissue. The pain from trochanteric bursitis can be severe and keep you from sleep. It can radiate to the buttocks or down the outside of the thigh to the knee. The pain is almost always worse when rising from the seated or lying position and with walking. Pain can improve after you take a few steps. It happens more often in people with hip joint and lumbar spine problems, such as arthritis or previous surgery. Very rarely the trochanteric bursa can become infected, and antibiotics and/or surgery may be needed. Treatment often includes an injection of local anesthetic mixed with cortisone medicine. This medicine is injected into the area where it is most tender over the hip. Repeat injections may be necessary if the response to treatment is slow. You can apply ice packs over the tender area for 30 minutes every 2 hours for the next few days. Anti-inflammatory and/or narcotic pain medicine may also be helpful. Limit your activity for the next few days if the pain continues. See your caregiver in 5-10 days if you are not greatly improved.  SEEK IMMEDIATE MEDICAL CARE IF:  You develop severe pain, fever, or increased redness.  You have pain that radiates below the knee. EXERCISES STRETCHING EXERCISES - Trochanteric Bursitis  These exercises may help you when beginning to rehabilitate your injury. Your symptoms may resolve with or without further involvement from your physician, physical therapist, or athletic trainer. While completing these exercises, remember:   Restoring tissue flexibility helps normal motion to return to the joints. This allows healthier, less painful movement and activity.  An effective stretch should be held for at least 30 seconds.  A stretch should never be painful. You should only  feel a gentle lengthening or release in the stretched tissue. STRETCH - Iliotibial Band  On the floor or bed, lie on your side so your injured leg is on top. Bend your knee and grab your ankle.  Slowly bring your knee back so that your thigh is in line with your trunk. Keep your heel at your buttocks and gently arch your back so your head, shoulders and hips line up.  Slowly lower your leg so that your knee approaches the floor/bed until you feel a gentle stretch on the outside of your thigh. If you do not feel a stretch and your knee will not fall farther, place the heel of your opposite foot on top of your knee and pull your thigh down farther.  Hold this stretch for __________ seconds.  Repeat __________ times. Complete this exercise __________ times per day. STRETCH - Hamstrings, Supine   Lie on your back. Loop a belt or towel over the ball of your foot as shown.  Straighten your knee and slowly pull on the belt to raise your injured leg. Do not allow the knee to bend. Keep your opposite leg flat on the floor.  Raise the leg until you feel a gentle stretch behind your knee or thigh. Hold this position for __________ seconds.  Repeat __________ times. Complete this stretch __________ times per day. STRETCH - Quadriceps, Prone   Lie on your stomach on a firm surface, such as a bed or padded floor.  Bend your knee and grasp your ankle. If you are unable to reach your ankle or pant leg, use a belt   around your foot to lengthen your reach.  Gently pull your heel toward your buttocks. Your knee should not slide out to the side. You should feel a stretch in the front of your thigh and/or knee.  Hold this position for __________ seconds.  Repeat __________ times. Complete this stretch __________ times per day. STRETCHING - Hip Flexors, Lunge Half kneel with your knee on the floor and your opposite knee bent and directly over your ankle.  Keep good posture with your head over your  shoulders. Tighten your buttocks to point your tailbone downward; this will prevent your back from arching too much.  You should feel a gentle stretch in the front of your thigh and/or hip. If you do not feel any resistance, slightly slide your opposite foot forward and then slowly lunge forward so your knee once again lines up over your ankle. Be sure your tailbone remains pointed downward.  Hold this stretch for __________ seconds.  Repeat __________ times. Complete this stretch __________ times per day. STRETCH - Adductors, Lunge  While standing, spread your legs.  Lean away from your injured leg by bending your opposite knee. You may rest your hands on your thigh for balance.  You should feel a stretch in your inner thigh. Hold for __________ seconds.  Repeat __________ times. Complete this exercise __________ times per day. Document Released: 09/26/2004 Document Revised: 01/03/2014 Document Reviewed: 12/01/2008 Exit[employer] Patient Information 2015 [employer]. This information is not intended to replace advice given to you by your health care provider. Make sure you discuss any questions you have with your health care provider.  

## 2015-04-28 ENCOUNTER — Encounter: Payer: Medicare Other | Attending: Physical Medicine & Rehabilitation

## 2015-04-28 ENCOUNTER — Ambulatory Visit (HOSPITAL_BASED_OUTPATIENT_CLINIC_OR_DEPARTMENT_OTHER): Payer: Medicare Other | Admitting: Physical Medicine & Rehabilitation

## 2015-04-28 ENCOUNTER — Encounter: Payer: Self-pay | Admitting: Physical Medicine & Rehabilitation

## 2015-04-28 DIAGNOSIS — Z8673 Personal history of transient ischemic attack (TIA), and cerebral infarction without residual deficits: Secondary | ICD-10-CM | POA: Insufficient documentation

## 2015-04-28 DIAGNOSIS — M47816 Spondylosis without myelopathy or radiculopathy, lumbar region: Secondary | ICD-10-CM | POA: Insufficient documentation

## 2015-04-28 DIAGNOSIS — G8110 Spastic hemiplegia affecting unspecified side: Secondary | ICD-10-CM | POA: Insufficient documentation

## 2015-04-28 NOTE — Patient Instructions (Signed)

## 2015-04-28 NOTE — Progress Notes (Signed)
Left Lumbar L3, L4  medial branch blocks and L 5 dorsal ramus injection under fluoroscopic guidance   Indication: Left Lumbar pain which is not relieved by medication management or other conservative care and interfering with self-care and mobility.  Informed consent was obtained after describing risks and benefits of the procedure with the patient, this includes bleeding, bruising, infection, paralysis and medication side effects.  The patient wishes to proceed and has given written consent.  The patient was placed in a prone position.  The lumbar area was marked and prepped with Betadine.  One mL of 1% lidocaine was injected into each of 3 areas into the skin and subcutaneous tissue.  Then a 22-gauge 3.5 inch spinal needle was inserted targeting the junction of the left S1 superior articular process and sacral ala junction.  Needle was advanced under fluoroscopic guidance.  Bone contact was made.  Omnipaque 180 was injected x 0.5 mL demonstrating no intravascular uptake.  Then a solution containing one mL of 4 mg per mL dexamethasone and 3 mL of 2% MPF lidocaine was injected x 0.5 mL.  Then the left L5 superior articular process in transverse process junction was targeted.  Bone contact was made.  Omnipaque 180 was injected x 0.5 mL demonstrating no intravascular uptake.  Then a solution containing one mL of 4 mg per mL dexamethasone and 3 mL of 2% MPF lidocaine was injected x 0.5 mL.  Then the left L4 superior articular process in transverse process junction was targeted.  Bone contact was made.  Omnipaque 180 was injected x 0.5 mL demonstrating no intravascular uptake.  Then a solution containing one mL of 4 mg per mL dexamethasone and 3 mL of 2% MPF lidocaine was injected x 0.5 mL.  Patient tolerated procedure well.  Post procedure instructions were given. 

## 2015-04-28 NOTE — Progress Notes (Signed)
  PROCEDURE RECORD [facility]   Name: Margaret Hess DOB:1966/08/15 MRN: [id number]  Date:04/28/2015  Physician: Claudette Laws, MD    Nurse/CMA: Shumaker RN Allergies:  Allergies  Allergen Reactions  . Penicillins Rash    Consent Signed: Yes.    Is patient diabetic? No.  CBG today?  Pregnant: No. LMP: No LMP recorded. (age 50-55)  Anticoagulants: no Anti-inflammatory: no Antibiotics: no  Procedure: Left medial Branch Blocks L 3-4-5 Position: Prone Start Time: 303 End Time: 308 Fluoro Time: 22sed  RN/CMA [employer], [employer]    Time 240 3:14    BP 129/67 133/75    Pulse 89 94    Respirations 16 16    O2 Sat 99 100    S/S 6 6    Pain Level 5/10 0/10     D/C home with friend, patient A & O X 3, D/C instructions reviewed, and sits independently.

## 2015-05-02 ENCOUNTER — Ambulatory Visit: Payer: Medicare Other | Admitting: Physical Medicine & Rehabilitation

## 2015-05-02 ENCOUNTER — Ambulatory Visit: Payer: Medicare Other

## 2015-05-03 ENCOUNTER — Other Ambulatory Visit: Payer: Self-pay | Admitting: Internal Medicine

## 2015-05-30 ENCOUNTER — Encounter: Payer: Medicare Other | Attending: Physical Medicine & Rehabilitation

## 2015-05-30 ENCOUNTER — Ambulatory Visit: Payer: Medicare Other | Admitting: Physical Medicine & Rehabilitation

## 2015-05-30 DIAGNOSIS — G8110 Spastic hemiplegia affecting unspecified side: Secondary | ICD-10-CM | POA: Insufficient documentation

## 2015-05-30 DIAGNOSIS — Z8673 Personal history of transient ischemic attack (TIA), and cerebral infarction without residual deficits: Secondary | ICD-10-CM | POA: Insufficient documentation

## 2015-06-04 ENCOUNTER — Other Ambulatory Visit: Payer: Self-pay | Admitting: Internal Medicine

## 2015-06-06 ENCOUNTER — Encounter: Payer: Self-pay | Admitting: Physical Medicine & Rehabilitation

## 2015-06-06 ENCOUNTER — Ambulatory Visit (HOSPITAL_BASED_OUTPATIENT_CLINIC_OR_DEPARTMENT_OTHER): Payer: Medicare Other | Admitting: Physical Medicine & Rehabilitation

## 2015-06-06 ENCOUNTER — Encounter: Payer: Medicare Other | Attending: Physical Medicine & Rehabilitation

## 2015-06-06 VITALS — BP 125/84 | HR 80

## 2015-06-06 DIAGNOSIS — G8114 Spastic hemiplegia affecting left nondominant side: Secondary | ICD-10-CM

## 2015-06-06 DIAGNOSIS — Z8673 Personal history of transient ischemic attack (TIA), and cerebral infarction without residual deficits: Secondary | ICD-10-CM | POA: Insufficient documentation

## 2015-06-06 DIAGNOSIS — G8110 Spastic hemiplegia affecting unspecified side: Secondary | ICD-10-CM | POA: Diagnosis not present

## 2015-06-06 NOTE — Patient Instructions (Signed)

## 2015-06-06 NOTE — Progress Notes (Signed)
Subjective:    Patient ID: Margaret Hess, female    DOB: 06/07/1966, 49 y.o.   MRN: [id number]  HPI   Pain Inventory Average Pain 6 Pain Right Now 4 My pain is constant, burning, dull and aching  In the last 24 hours, has pain interfered with the following? General activity 7 Relation with others 5 Enjoyment of life 8 What TIME of day is your pain at its worst? all Sleep (in general) Good  Pain is worse with: sitting and standing Pain improves with: rest, heat/ice, therapy/exercise and medication Relief from Meds: 3  Mobility use a cane ability to climb steps?  yes do you drive?  yes Do you have any goals in this area?  yes  Function disabled: date disabled 05/2012  Neuro/Psych numbness trouble walking confusion depression  Prior Studies Any changes since last visit?  no  Physicians involved in your care Any changes since last visit?  no   Family History  Problem Relation Age of Onset  . Multiple myeloma Mother   . Heart disease Father   . Hypertension Father    Social History   Social History  . Marital Status: Divorced    Spouse Name: N/A  . Number of Children: N/A  . Years of Education: N/A   Social History Main Topics  . Smoking status: Never Smoker   . Smokeless tobacco: Never Used  . Alcohol Use: Yes     Comment: 1 mixed drink every 6 months  . Drug Use: No  . Sexual Activity: Yes    Birth Control/ Protection: Inserts   Other Topics Concern  . None   Social History Narrative   Past Surgical History  Procedure Laterality Date  . Wisdom tooth extraction    . Left and right heart catheterization with coronary angiogram N/A 06/16/2012    Procedure: LEFT AND RIGHT HEART CATHETERIZATION WITH CORONARY ANGIOGRAM;  Surgeon: Jolaine Artist, MD;  Location: [facility] CATH LAB;  Service: Cardiovascular;  Laterality: N/A;   Past Medical History  Diagnosis Date  . Migraines   . Hypertension   . Diabetes mellitus   . Stroke ([place])   . CHF  (congestive heart failure) (HCC)    BP 125/84 mmHg  Pulse 80  SpO2 98%  Opioid Risk Score:   Fall Risk Score:  `1  Depression screen PHQ 2/9  Depression screen [facility] 2/9 04/28/2015 03/30/2015 11/18/2014  Decreased Interest '2 2 2  ' Down, Depressed, Hopeless '1 1 1  ' PHQ - 2 Score '3 3 3  ' Altered sleeping - - 0  Tired, decreased energy - - 0  Change in appetite - - 0  Feeling bad or failure about yourself  - - 1  Trouble concentrating - - 2  Moving slowly or fidgety/restless - - 3  Suicidal thoughts - - 1  PHQ-9 Score - - 10     Review of Systems  Musculoskeletal: Positive for gait problem.  Neurological: Positive for numbness.  Psychiatric/Behavioral: Positive for confusion and dysphoric mood.  All other systems reviewed and are negative.      Objective:   Physical Exam        Assessment & Plan:    Subjective:    Patient ID: Margaret Hess, female    DOB: [birth year], 49 y.o.   MRN: [id number]  HPI    Review of Systems     Objective:   Physical Exam        Assessment & Plan:  Botox Injection for  spasticity using needle EMG guidance  Dilution: 50 Units/ml Indication: Severe spasticity which interferes with ADL,mobility and/or  hygiene and is unresponsive to medication management and other conservative care Informed consent was obtained after describing risks and benefits of the procedure with the patient. This includes bleeding, bruising, infection, excessive weakness, or medication side effects. A REMS form is on file and signed. Needle: 30g 1" needle electrode Number of units per muscle FCR50   FDS50   FCU25 FDP50   FPL25  FDL 50   FDB 50    FHB 50- medial prox to met head   Post Tib 75   All injections were done after obtaining appropriate EMG activity and after negative drawback for blood. The patient tolerated the procedure well. Post procedure instructions were given. A followup appointment was made.   Patient notes a 2-1/2 month duration  of response To Botox would like to try Dysport next time

## 2015-07-08 ENCOUNTER — Other Ambulatory Visit: Payer: Self-pay | Admitting: Internal Medicine

## 2015-09-07 ENCOUNTER — Encounter: Payer: Self-pay | Admitting: Physical Medicine & Rehabilitation

## 2015-09-07 ENCOUNTER — Ambulatory Visit (HOSPITAL_BASED_OUTPATIENT_CLINIC_OR_DEPARTMENT_OTHER): Payer: Medicare Other | Admitting: Physical Medicine & Rehabilitation

## 2015-09-07 ENCOUNTER — Encounter: Payer: Medicare Other | Attending: Physical Medicine & Rehabilitation

## 2015-09-07 DIAGNOSIS — G8110 Spastic hemiplegia affecting unspecified side: Secondary | ICD-10-CM | POA: Diagnosis not present

## 2015-09-07 DIAGNOSIS — G8114 Spastic hemiplegia affecting left nondominant side: Secondary | ICD-10-CM | POA: Diagnosis not present

## 2015-09-07 DIAGNOSIS — Z8673 Personal history of transient ischemic attack (TIA), and cerebral infarction without residual deficits: Secondary | ICD-10-CM | POA: Diagnosis not present

## 2015-09-07 NOTE — Progress Notes (Signed)
Subjective:    Patient ID: Margaret Hess, female    DOB: April 24, 1966, 50 y.o.   MRN: [id number]  HPI    Pain Inventory Average Pain 6 Pain Right Now 4 My pain is constant, burning, dull and aching  In the last 24 hours, has pain interfered with the following? General activity 7 Relation with others 5 Enjoyment of life 8 What TIME of day is your pain at its worst? all Sleep (in general) Good  Pain is worse with: sitting and standing Pain improves with: rest, heat/ice, therapy/exercise and medication Relief from Meds: 3  Mobility use a cane ability to climb steps?  yes do you drive?  yes Do you have any goals in this area?  yes  Function disabled: date disabled 05/2012  Neuro/Psych numbness trouble walking confusion depression  Prior Studies Any changes since last visit?  no  Physicians involved in your care Any changes since last visit?  no   Family History  Problem Relation Age of Onset  . Multiple myeloma Mother   . Heart disease Father   . Hypertension Father    Social History   Social History  . Marital Status: Divorced    Spouse Name: N/A  . Number of Children: N/A  . Years of Education: N/A   Social History Main Topics  . Smoking status: Never Smoker   . Smokeless tobacco: Never Used  . Alcohol Use: Yes     Comment: 1 mixed drink every 6 months  . Drug Use: No  . Sexual Activity: Yes    Birth Control/ Protection: Inserts   Other Topics Concern  . None   Social History Narrative   Past Surgical History  Procedure Laterality Date  . Wisdom tooth extraction    . Left and right heart catheterization with coronary angiogram N/A 06/16/2012    Procedure: LEFT AND RIGHT HEART CATHETERIZATION WITH CORONARY ANGIOGRAM;  Surgeon: Jolaine Artist, MD;  Location: [facility] CATH LAB;  Service: Cardiovascular;  Laterality: N/A;   Past Medical History  Diagnosis Date  . Migraines   . Hypertension   . Diabetes mellitus   . Stroke ([place])   . CHF  (congestive heart failure) ([place])    There were no vitals taken for this visit.  Opioid Risk Score:   Fall Risk Score:  `1  Depression screen PHQ 2/9  Depression screen [facility] 2/9 04/28/2015 03/30/2015 11/18/2014  Decreased Interest '2 2 2  ' Down, Depressed, Hopeless '1 1 1  ' PHQ - 2 Score '3 3 3  ' Altered sleeping - - 0  Tired, decreased energy - - 0  Change in appetite - - 0  Feeling bad or failure about yourself  - - 1  Trouble concentrating - - 2  Moving slowly or fidgety/restless - - 3  Suicidal thoughts - - 1  PHQ-9 Score - - 10     Review of Systems  Musculoskeletal: Positive for gait problem.  Neurological: Positive for numbness.  Psychiatric/Behavioral: Positive for confusion and dysphoric mood.  All other systems reviewed and are negative.      Objective:   Physical Exam         Assessment & Plan:    Subjective:    Patient ID: Margaret Hess, female    DOB: July 20, 1966, 50 y.o.   MRN: [id number]  HPI    Review of Systems     Objective:   Physical Exam        Assessment & Plan:  Dysport Injection  for spasticity using needle EMG guidance  Dilution: 100 Units/.74m Indication: Severe spasticity which interferes with ADL,mobility and/or  hygiene and is unresponsive to medication management and other conservative care Informed consent was obtained after describing risks and benefits of the procedure with the patient. This includes bleeding, bruising, infection, excessive weakness, or medication side effects. A REMS form is on file and signed. Needle: 30g 1" needle electrode Number of units per muscle FCR100   FDS100   FCU50 FDP100   FPL50  FDL 100   FDB 100    FHB 100- medial prox to met head   Post Tib 150   EHL 150 All injections were done after obtaining appropriate EMG activity and after negative drawback for blood. The patient tolerated the procedure well. Post procedure instructions were given. A followup appointment was made.   Patient  notes a 2-1/2 month duration of response To Botox would like to try Dysport next time

## 2015-09-07 NOTE — Patient Instructions (Signed)
Next visit we will do the nerve block to reduce your excessive tone at the elbow flexor muscles

## 2015-10-06 ENCOUNTER — Encounter: Payer: Self-pay | Admitting: Physical Medicine & Rehabilitation

## 2015-10-06 ENCOUNTER — Ambulatory Visit (HOSPITAL_BASED_OUTPATIENT_CLINIC_OR_DEPARTMENT_OTHER): Payer: Medicare Other | Admitting: Physical Medicine & Rehabilitation

## 2015-10-06 ENCOUNTER — Encounter: Payer: Medicare Other | Attending: Physical Medicine & Rehabilitation

## 2015-10-06 VITALS — BP 115/72 | HR 74

## 2015-10-06 DIAGNOSIS — Z8673 Personal history of transient ischemic attack (TIA), and cerebral infarction without residual deficits: Secondary | ICD-10-CM | POA: Insufficient documentation

## 2015-10-06 DIAGNOSIS — G8110 Spastic hemiplegia affecting unspecified side: Secondary | ICD-10-CM | POA: Insufficient documentation

## 2015-10-06 NOTE — Progress Notes (Signed)
Phenol neurolysis of the Left musculo- cutaneous nerve  Indication: Severe spasticity in the arm flexor muscles which is not responding to medical management and other conservative care and interfering with functional use and hygiene.  Informed consent was obtained after describing the risks and benefits of the procedure with the patient this includes bleeding bruising and infection as well as medication side effects. The patient elected to proceed and has given written consent. Patient placed in a supine position on the exam table. External DC stimulation was applied to the axilla using a nerve stimulator. Arm flexion twitch was obtained. The axillary region was prepped with Betadine and then entered with a 22-gauge 40 mm needle electrode under electrical stimulation guidance. Arm flexion which was obtained and confirmed. Then 4 cc of 5% phenol were injected. The patient tolerated procedure well. Post procedure instructions and followup visit were given. 

## 2015-10-06 NOTE — Patient Instructions (Signed)
Phenol injection today Take Rx for splint to [facility]

## 2015-11-03 ENCOUNTER — Encounter: Payer: Medicare Other | Attending: Physical Medicine & Rehabilitation

## 2015-11-03 ENCOUNTER — Ambulatory Visit (HOSPITAL_BASED_OUTPATIENT_CLINIC_OR_DEPARTMENT_OTHER): Payer: Medicare Other | Admitting: Physical Medicine & Rehabilitation

## 2015-11-03 ENCOUNTER — Encounter: Payer: Self-pay | Admitting: Physical Medicine & Rehabilitation

## 2015-11-03 VITALS — BP 128/84 | HR 90 | Resp 14

## 2015-11-03 DIAGNOSIS — G8114 Spastic hemiplegia affecting left nondominant side: Secondary | ICD-10-CM

## 2015-11-03 DIAGNOSIS — Z8673 Personal history of transient ischemic attack (TIA), and cerebral infarction without residual deficits: Secondary | ICD-10-CM | POA: Diagnosis not present

## 2015-11-03 DIAGNOSIS — G8110 Spastic hemiplegia affecting unspecified side: Secondary | ICD-10-CM | POA: Diagnosis not present

## 2015-11-03 NOTE — Progress Notes (Signed)
Dysport Injection for spasticity using needle EMG guidance  Dilution: 100 Units/.9ml Indication: Severe spasticity which interferes with ADL,mobility and/or  hygiene and is unresponsive to medication management and other conservative care Informed consent was obtained after describing risks and benefits of the procedure with the patient. This includes bleeding, bruising, infection, excessive weakness, or medication side effects. A REMS form is on file and signed. Needle: 30g 1" needle electrode Number of units per muscle FCR100   Biceps 200  Brachial radialis 100 FPL100   Lower ext FDL 100   FDB 100       Post Tib 200   EHL 100 All injections were done after obtaining appropriate EMG activity and after negative drawback for blood. The patient tolerated the procedure well. Post procedure instructions were given. A followup appointment was made.   Patient notes a 55-month duration of response to the Botox this time therefore we have tried the Dysport. It's only been 2 months since last injection however given the extent of her spasticity and the short duration of her response to Botox I feel this is justified

## 2015-11-18 ENCOUNTER — Other Ambulatory Visit: Payer: Self-pay | Admitting: Internal Medicine

## 2015-12-18 ENCOUNTER — Encounter: Payer: Medicare Other | Attending: Physical Medicine & Rehabilitation

## 2015-12-18 ENCOUNTER — Ambulatory Visit: Payer: Medicare Other | Admitting: Physical Medicine & Rehabilitation

## 2015-12-18 DIAGNOSIS — Z8673 Personal history of transient ischemic attack (TIA), and cerebral infarction without residual deficits: Secondary | ICD-10-CM | POA: Insufficient documentation

## 2015-12-18 DIAGNOSIS — G8110 Spastic hemiplegia affecting unspecified side: Secondary | ICD-10-CM | POA: Insufficient documentation

## 2015-12-26 ENCOUNTER — Ambulatory Visit (HOSPITAL_BASED_OUTPATIENT_CLINIC_OR_DEPARTMENT_OTHER): Payer: Medicare Other | Admitting: Physical Medicine & Rehabilitation

## 2015-12-26 ENCOUNTER — Encounter: Payer: Self-pay | Admitting: Physical Medicine & Rehabilitation

## 2015-12-26 VITALS — BP 134/63 | HR 89

## 2015-12-26 DIAGNOSIS — G8110 Spastic hemiplegia affecting unspecified side: Secondary | ICD-10-CM | POA: Diagnosis present

## 2015-12-26 DIAGNOSIS — Z8673 Personal history of transient ischemic attack (TIA), and cerebral infarction without residual deficits: Secondary | ICD-10-CM | POA: Diagnosis not present

## 2015-12-26 NOTE — Patient Instructions (Signed)
We will be making minor changes to the injection dosages

## 2015-12-26 NOTE — Progress Notes (Signed)
Subjective:    Patient ID: Margaret Hess, female    DOB: Sep 03, 1965, 50 y.o.   MRN: [id number]  HPI Patient notes improvements in getting her shoe and sock on after Botox to the EHL muscle. She is walking better with her brace on but with her brace off still has strong inversion. Has not noted much improvement in thumb flexion but is able to raise her arm better. Pain Inventory Average Pain 4 Pain Right Now 7 My pain is burning, dull and aching  In the last 24 hours, has pain interfered with the following? General activity 6 Relation with others 6 Enjoyment of life 8 What TIME of day is your pain at its worst? daytime evening and night Sleep (in general) Good  Pain is worse with: sitting Pain improves with: rest, heat/ice, therapy/exercise and medication Relief from Meds: 5  Mobility Do you have any goals in this area?  no  Function Do you have any goals in this area?  no  Neuro/Psych weakness trouble walking depression  Prior Studies Any changes since last visit?  no  Physicians involved in your care Any changes since last visit?  no   Family History  Problem Relation Age of Onset  . Multiple myeloma Mother   . Heart disease Father   . Hypertension Father    Social History   Social History  . Marital Status: Divorced    Spouse Name: N/A  . Number of Children: N/A  . Years of Education: N/A   Social History Main Topics  . Smoking status: Never Smoker   . Smokeless tobacco: Never Used  . Alcohol Use: Yes     Comment: 1 mixed drink every 6 months  . Drug Use: No  . Sexual Activity: Yes    Birth Control/ Protection: Inserts   Other Topics Concern  . None   Social History Narrative   Past Surgical History  Procedure Laterality Date  . Wisdom tooth extraction    . Left and right heart catheterization with coronary angiogram N/A 06/16/2012    Procedure: LEFT AND RIGHT HEART CATHETERIZATION WITH CORONARY ANGIOGRAM;  Surgeon: Jolaine Artist,  MD;  Location: [facility] CATH LAB;  Service: Cardiovascular;  Laterality: N/A;   Past Medical History  Diagnosis Date  . Migraines   . Hypertension   . Diabetes mellitus   . Stroke ([place])   . CHF (congestive heart failure) (HCC)    BP 134/63 mmHg  Pulse 89  SpO2 97%  Opioid Risk Score:   Fall Risk Score:  `1  Depression screen PHQ 2/9  Depression screen [facility] 2/9 12/26/2015 10/06/2015 04/28/2015 03/30/2015 11/18/2014  Decreased Interest _0 Down, Depressed, Hopeless _1 PHQ - 2 Score _2 Altered sleeping - - - - 0  Tired, decreased energy - - - - 0  Change in appetite - - - - 0  Feeling bad or failure about yourself  - - - - 1  Trouble concentrating - - - - 2  Moving slowly or fidgety/restless - - - - 3  Suicidal thoughts - - - - 1  PHQ-9 Score - - - - 10     Review of Systems  All other systems reviewed and are negative.      Objective:   Physical Exam  Patient has increased lumbrical tone. She has good finger extension and in fact has some hyperextension at PIP at the third  digit. She is not able to achieve full elbow extension actively but is able to do so passively. Tone is Ashworth 3 at the elbow flexors Ashworth 1 at finger flexors Ashworth 1 at wrist flexors Ashworth 3 at the left thumb flexor She has hyperactive Babinski but this reduces with knee flexion Equinovarus not noted until she tries to stand and then lifts up her leg      Assessment & Plan:  1.  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 Has had improvements in her ambulation after repeat botulinum toxin injection. She does very well with her brace on and shoe on however during the night when she does not have her brace and shoe on she still has strong inversion. We discussed that she is already on maximum dose and that we would have to reduce the upper extremity dose to add to the lower extremity. I would recommend the following changes FCR100   Biceps 200  Brachial radialis  100 FPL0   Lower ext FDL 100   FDB 100       Post Tib 300   EHL 100 EHL 100  We'll repeat in 6 weeks. Hopefully the Dysport will work a solid  12 weeks rather than the 8 week duration of the Botox

## 2016-01-02 ENCOUNTER — Other Ambulatory Visit: Payer: Self-pay

## 2016-01-05 ENCOUNTER — Other Ambulatory Visit (HOSPITAL_COMMUNITY): Payer: Self-pay | Admitting: *Deleted

## 2016-01-05 MED ORDER — CARVEDILOL 3.125 MG PO TABS: ORAL_TABLET | ORAL | Status: DC

## 2016-02-06 ENCOUNTER — Ambulatory Visit (HOSPITAL_BASED_OUTPATIENT_CLINIC_OR_DEPARTMENT_OTHER): Payer: Medicare Other | Admitting: Physical Medicine & Rehabilitation

## 2016-02-06 ENCOUNTER — Encounter: Payer: Self-pay | Admitting: Physical Medicine & Rehabilitation

## 2016-02-06 ENCOUNTER — Encounter: Payer: Medicare Other | Attending: Physical Medicine & Rehabilitation

## 2016-02-06 VITALS — BP 136/74 | HR 94 | Resp 12

## 2016-02-06 DIAGNOSIS — G8110 Spastic hemiplegia affecting unspecified side: Secondary | ICD-10-CM

## 2016-02-06 DIAGNOSIS — Z8673 Personal history of transient ischemic attack (TIA), and cerebral infarction without residual deficits: Secondary | ICD-10-CM | POA: Insufficient documentation

## 2016-02-06 DIAGNOSIS — G8114 Spastic hemiplegia affecting left nondominant side: Secondary | ICD-10-CM | POA: Diagnosis not present

## 2016-02-06 NOTE — Progress Notes (Signed)
Dysport Injection for spasticity using needle EMG guidance  Dilution: 500 Units/2.5 ml Indication: Severe spasticity which interferes with ADL,mobility and/or  hygiene and is unresponsive to medication management and other conservative care Informed consent was obtained after describing risks and benefits of the procedure with the patient. This includes bleeding, bruising, infection, excessive weakness, or medication side effects. A REMS form is on file and signed. Needle: 30g 1" for foot, 25g 2" for Leg and arm  needle electrode Number of units per muscle FCR100   Biceps 200  Brachial radialis 100 FPL0   Lower ext FDL 100   FDB 100       Post Tib 300   EHL 100 All injections were done after obtaining appropriate EMG activity and after negative drawback for blood. The patient tolerated the procedure well. Post procedure instructions were given. A followup appointment was made.  

## 2016-02-06 NOTE — Patient Instructions (Addendum)
You receive date Dysport injection today. It is similar to Botox but may be longer lasting. We'll see you back in about 8 weeks to see how this helped to and whether it still working. Your Botox only lasted about 7-8 weeks.

## 2016-02-12 ENCOUNTER — Telehealth (HOSPITAL_COMMUNITY): Payer: Self-pay | Admitting: Vascular Surgery

## 2016-02-12 NOTE — Telephone Encounter (Signed)
Left pt message to reschedule appt  

## 2016-02-12 NOTE — Telephone Encounter (Signed)
Left pt message to reschedule appt per DB 

## 2016-02-13 ENCOUNTER — Inpatient Hospital Stay (HOSPITAL_COMMUNITY): Admission: RE | Admit: 2016-02-13 | Payer: Medicare Other | Source: Ambulatory Visit | Admitting: Internal Medicine

## 2016-03-06 ENCOUNTER — Ambulatory Visit (HOSPITAL_COMMUNITY)
Admission: RE | Admit: 2016-03-06 | Discharge: 2016-03-06 | Disposition: A | Discharge status: Home / Self Care | Payer: Medicare Other | Source: Ambulatory Visit | Attending: Internal Medicine | Admitting: Internal Medicine

## 2016-03-06 ENCOUNTER — Encounter (HOSPITAL_COMMUNITY): Payer: Self-pay | Admitting: Internal Medicine

## 2016-03-06 ENCOUNTER — Ambulatory Visit (HOSPITAL_BASED_OUTPATIENT_CLINIC_OR_DEPARTMENT_OTHER)
Admission: RE | Admit: 2016-03-06 | Discharge: 2016-03-06 | Disposition: A | Discharge status: Home / Self Care | Payer: Medicare Other | Source: Ambulatory Visit | Attending: Internal Medicine | Admitting: Internal Medicine

## 2016-03-06 VITALS — BP 122/78 | HR 87 | Wt 185.5 lb

## 2016-03-06 DIAGNOSIS — I5022 Chronic systolic (congestive) heart failure: Secondary | ICD-10-CM

## 2016-03-06 DIAGNOSIS — E119 Type 2 diabetes mellitus without complications: Secondary | ICD-10-CM | POA: Diagnosis not present

## 2016-03-06 DIAGNOSIS — I110 Hypertensive heart disease with heart failure: Secondary | ICD-10-CM | POA: Insufficient documentation

## 2016-03-06 DIAGNOSIS — Z8673 Personal history of transient ischemic attack (TIA), and cerebral infarction without residual deficits: Secondary | ICD-10-CM | POA: Insufficient documentation

## 2016-03-06 LAB — ECHOCARDIOGRAM COMPLETE
CHL CUP DOP CALC LVOT VTI: 19.5 cm
E/e' ratio: 6.06
EWDT: 289 ms
FS: 8 % — AB (ref 28–44)
IVS/LV PW RATIO, ED: 0.76
LA diam index: 1.61 cm/m2
LA vol A4C: 22.6 ml
LASIZE: 32 mm
LDCA: 2.27 cm2
LEFT ATRIUM END SYS DIAM: 32 mm
LV E/e' medial: 6.06
LV PW d: 9.81 mm — AB (ref 0.6–1.1)
LV TDI E'LATERAL: 14
LV dias vol index: 45 mL/m2
LV e' LATERAL: 14 cm/s
LV sys vol: 50 mL — AB (ref 14–42)
LVDIAVOL: 89 mL (ref 46–106)
LVEEAVG: 6.06
LVOT peak vel: 87.9 cm/s
LVOTD: 17 mm
LVOTSV: 44 mL
LVSYSVOLIN: 25 mL/m2
MV Dec: 289
MV Peak grad: 3 mmHg
MV pk A vel: 83.9 m/s
MVPKEVEL: 84.9 m/s
RV LATERAL S' VELOCITY: 10.7 cm/s
Simpson's disk: 44
Stroke v: 39 ml
TAPSE: 18.7 mm
TDI e' medial: 9.03
Weight: 2968 oz

## 2016-03-06 NOTE — Progress Notes (Signed)
ADVANCED HF CLINIC NOTE  atient ID: Margaret Hess, female   DOB: 1965-12-07, 50 y.o.   MRN: [id number] Patient ID: Margaret Hess, female   DOB: 12/05/1965, 50 y.o.   MRN: [id number]  PCP: Dr. Piedad Climes  Neurologist: Dr Pearlean Brownie Pain: Dr Wynn Banker  HPI: Margaret Hess is 50 yo woman with history of DM, HTN, NICM, EF <20%. LV thrombus and  CVA with residual aphasia, L sided weakness and spastic hemiplegia.   07/07/12 ECHO EF 20-25% grade II diastolic dysfunction  10/21/12 ECHO EF 35-40% grade 1 diastolic dysfunction 04/15/13 ECHO EF 50% 7/15 ECHO EF 50-55%, mild LVH, no LV apical thrombus.   Follow up: We have not seen her in the HF Clinic since 2015.Returns today for cardiac clearance for driving.  Doing well from HF perspective. Walking with cane. Denies SOB, orthopnea, CP or edema. Continues to see Dr. Wynn Banker for treatment of spastic hemiplegia.   Takings medications as prescribed. Following low salt diet and drinking less than 2L a day.   She has not had to use any Lasix. No syncope or presyncope.   Labs (1/15): K 4.1, creatinine 0.61  ROS: All systems negative except as listed in HPI, PMH and Problem List.  Past Medical History  Diagnosis Date  . Migraines   . Hypertension   . Diabetes mellitus   . Stroke (HCC)   . CHF (congestive heart failure) (HCC)     Current Outpatient Prescriptions  Medication Sig Dispense Refill  . acetaminophen (TYLENOL) 500 MG tablet Take 1,000 mg by mouth every 6 (six) hours as needed for pain.     Marland Kitchen ALPRAZolam (XANAX) 0.5 MG tablet Take 0.5 mg by mouth at bedtime.    Marland Kitchen aspirin EC 81 MG tablet Take 1 tablet (81 mg total) by mouth daily. 90 tablet 3  . carvedilol (COREG) 3.125 MG tablet TAKE 1 TABLET BY MOUTH TWICE DAILY WITH MEAL 60 tablet 3  . chlorzoxazone (PARAFON) 500 MG tablet Take 500 mg by mouth 3 (three) times daily as needed for muscle spasms.    . citalopram (CELEXA) 20 MG tablet Take 40 mg by mouth every morning.     . DULoxetine  (CYMBALTA) 60 MG capsule Take 60 mg by mouth daily.    Marland Kitchen gabapentin (NEURONTIN) 800 MG tablet Take 800-1,600 mg by mouth 3 (three) times daily. Takes 1 tablet in morning, 1 tablet at lunch, and 2 tablets at bedtime    . HYDROcodone-acetaminophen (NORCO/VICODIN) 5-325 MG per tablet Take 1 tablet by mouth 2 (two) times daily.     . Linaclotide (LINZESS) 145 MCG CAPS capsule Take 1 capsule by mouth daily.    Marland Kitchen lisinopril (PRINIVIL,ZESTRIL) 5 MG tablet Take 1 tablet (5 mg total) by mouth daily. MUST HAVE FOLLOW UP APPOINTMENT FOR FURTHER REFILLS 30 tablet 2  . mupirocin cream (BACTROBAN) 2 %     . nitrofurantoin, macrocrystal-monohydrate, (MACROBID) 100 MG capsule     . ondansetron (ZOFRAN-ODT) 8 MG disintegrating tablet     . ONE TOUCH ULTRA TEST test strip     . pantoprazole (PROTONIX) 40 MG tablet Take 40 mg by mouth every morning.     . phenazopyridine (PYRIDIUM) 100 MG tablet     . spironolactone (ALDACTONE) 25 MG tablet Take 25 mg by mouth daily.     . tamsulosin (FLOMAX) 0.4 MG CAPS capsule     . tiZANidine (ZANAFLEX) 4 MG tablet Take 1 tablet (4 mg total) by mouth at bedtime. 30 tablet 2  .  zolpidem (AMBIEN) 5 MG tablet Take 1 tablet by mouth at bedtime as needed.  0   No current facility-administered medications for this encounter.     Filed Vitals:   03/06/16 1351  BP: 122/78  Pulse: 87  Weight: 185 lb 8 oz (84.142 kg)  SpO2: 96%    PHYSICAL EXAM: General:  Well appearing. No resp difficulty.  HEENT: normal Neck: supple. JVP flat. Carotids 2+ bilaterally; no bruits. No lymphadenopathy or thryomegaly appreciated. Cor: PMI normal. Regular rate & rhythm. No rubs, gallops or murmurs. Lungs: clear Abdomen: soft, nontender, nondistended. No hepatosplenomegaly. No bruits or masses. Good bowel sounds. Extremities: no cyanosis, clubbing, rash, edema. LLE brace in place. LUE spastic Neuro: alert. Oriented. Expressive aphasia much improved, cranial nerves grossly intact. L-sided  weakness. Affect pleasant.   ASSESSMENT & PLAN:  1) Chronic systolic HF: nonischemic, ?viral cardiomyopathy; EF improved to 50-55% on last echo. Repeat echo today EF ~50% (viewed personally)  - NYHA I symptoms and volume status stable.  = She is not currently on lasix instructed to call if weight is increasing. She has not tolerated lasix in the past due to severe hypotension. - Continue coreg 3.125 mg BID and lisinopril 5 mg daily.   - EF today relative preserved. No syncope. Ok to drive from cardiac perspective. She will clearance from neurological perspective.   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  She has residual expressive aphasia, spastic hemiplegia and L-sided weakness.    Arvilla Meres MD 03/06/2016

## 2016-03-06 NOTE — Patient Instructions (Signed)
Your physician has requested that you have an echocardiogram. Echocardiography is a painless test that uses sound waves to create images of your heart. It provides your doctor with information about the size and shape of your heart and how well your heart's chambers and valves are working. This procedure takes approximately one hour. There are no restrictions for this procedure.  Follow up as needed 

## 2016-03-06 NOTE — Progress Notes (Signed)
  Echocardiogram 2D Echocardiogram has been performed.  Margaret Hess 03/06/2016, 3:54 PM

## 2016-03-08 ENCOUNTER — Telehealth (HOSPITAL_COMMUNITY): Payer: Self-pay | Admitting: *Deleted

## 2016-03-08 NOTE — Telephone Encounter (Signed)
Letter faxed to [facility] [employer]V at [phone], spoke w/pt she is aware of echo results and letter, copy of letter mailed to her.

## 2016-03-08 NOTE — Telephone Encounter (Signed)
Attempted to call pt w/echo results and info about letter Dr Gala Romney wrote for her, Left message to call back

## 2016-03-18 ENCOUNTER — Telehealth: Payer: Self-pay | Admitting: *Deleted

## 2016-03-18 NOTE — Telephone Encounter (Signed)
Rn call patient to schedule appt for 04/02/2016. Rn stated [employer] has a policy of 50.00 dollars for any forms to be filled out,and its a 10 day window for completion. Pt verbalized understanding. Pt schedule for 04/02/2016 at 0200pm.

## 2016-03-18 NOTE — Telephone Encounter (Signed)
Rn call patient back about the form for her disability. Rn ask patient how long has she been disable and who has been filling the form out for her. Rn stated she was last seen 2015 and was told to follow up as needed. Pt stated she has been disable for three years, and her PCP has been filling out her disability form. PT stated she is disable from the stroke because of her speech and leg weaker. Pt stated something is different on the form that her PCP could not do. Rn explain she was last seen 2015 and will have to come in for an appt with Dr. Pearlean Brownie. Rn stated a message was sent to Dr.Sethi.

## 2016-03-18 NOTE — Telephone Encounter (Signed)
Agree with plan 

## 2016-03-18 NOTE — Telephone Encounter (Addendum)
Rn spoke with Dr. Pearlean Brownie and gave him the message of what the patient says. Dr. Pearlean Brownie stated patient would need to come in for assessment for form to be completed.

## 2016-03-26 ENCOUNTER — Encounter: Payer: Self-pay | Admitting: Physical Medicine & Rehabilitation

## 2016-03-26 ENCOUNTER — Ambulatory Visit (HOSPITAL_BASED_OUTPATIENT_CLINIC_OR_DEPARTMENT_OTHER): Payer: Medicare Other | Admitting: Physical Medicine & Rehabilitation

## 2016-03-26 ENCOUNTER — Encounter: Payer: Medicare Other | Attending: Physical Medicine & Rehabilitation

## 2016-03-26 VITALS — BP 131/86 | HR 91 | Resp 16

## 2016-03-26 DIAGNOSIS — Z8673 Personal history of transient ischemic attack (TIA), and cerebral infarction without residual deficits: Secondary | ICD-10-CM | POA: Diagnosis not present

## 2016-03-26 DIAGNOSIS — G8110 Spastic hemiplegia affecting unspecified side: Secondary | ICD-10-CM

## 2016-03-26 NOTE — Patient Instructions (Signed)
We will do the same treatment dosing and muscle selection next time.

## 2016-03-26 NOTE — Progress Notes (Signed)
Subjective:     Patient ID: Margaret Hess, female   DOB: 04/22/1966, 50 y.o.   MRN: [id number] FCR100  Biceps 200  Brachial radialis 100 FPL0  Lower ext FDL 100  FDB 100     Post Tib 300  EHL 100 HPI  Patient is pleased with the effects of the Dysport. She is able to range the left arm a little bit better, although she is not keeping up with her stretching exercises as much. Her left foot is having less toe curling. She still has some great toe extension, however. Pain Inventory Average Pain 5 Pain Right Now 4 My pain is constant, burning and dull  In the last 24 hours, has pain interfered with the following? General activity 4 Relation with others 3 Enjoyment of life 4 What TIME of day is your pain at its worst? morning, daytime, evening, night Sleep (in general) Good  Pain is worse with: sitting Pain improves with: rest, heat/ice, therapy/exercise and medication Relief from Meds: 6  Mobility Do you have any goals in this area?  no  Function Do you have any goals in this area?  no  Neuro/Psych trouble walking depression  Prior Studies Any changes since last visit?  no  Physicians involved in your care Any changes since last visit?  no   Family History  Problem Relation Age of Onset  . Multiple myeloma Mother   . Heart disease Father   . Hypertension Father    Social History   Social History  . Marital status: Divorced    Spouse name: N/A  . Number of children: N/A  . Years of education: N/A   Social History Main Topics  . Smoking status: Never Smoker  . Smokeless tobacco: Never Used  . Alcohol use Yes     Comment: 1 mixed drink every 6 months  . Drug use: No  . Sexual activity: Yes    Birth control/ protection: Inserts   Other Topics Concern  . None   Social History Narrative  . None   Past Surgical History:  Procedure Laterality Date  . LEFT AND RIGHT HEART CATHETERIZATION WITH CORONARY ANGIOGRAM N/A 06/16/2012   Procedure: LEFT AND RIGHT HEART CATHETERIZATION WITH CORONARY ANGIOGRAM;  Surgeon: Daniel R Bensimhon, MD;  Location: [facility] CATH LAB;  Service: Cardiovascular;  Laterality: N/A;  . WISDOM TOOTH EXTRACTION     Past Medical History:  Diagnosis Date  . CHF (congestive heart failure) (HCC)   . Diabetes mellitus   . Hypertension   . Migraines   . Stroke (HCC)    BP 131/86   Pulse 91   Resp 16   LMP 02/01/2016   SpO2 97%   Opioid Risk Score:   Fall Risk Score:  `1  Depression screen PHQ 2/9  Depression screen PHQ 2/9 12/26/2015 10/06/2015 04/28/2015 03/30/2015 11/18/2014  Decreased Interest 2 2 2 2 2  Down, Depressed, Hopeless 1 1 1 1 1  PHQ - 2 Score 3 3 3 3 3  Altered sleeping - - - - 0  Tired, decreased energy - - - - 0  Change in appetite - - - - 0  Feeling bad or failure about yourself  - - - - 1  Trouble concentrating - - - - 2  Moving slowly or fidgety/restless - - - - 3  Suicidal thoughts - - - - 1  PHQ-9 Score - - - - 10    Review of Systems  Constitutional: Positive for unexpected weight   change.  Musculoskeletal: Positive for gait problem.  Psychiatric/Behavioral: Positive for dysphoric mood.  All other systems reviewed and are negative.      Objective:   Physical Exam  Constitutional: She appears well-developed and well-nourished.  HENT:  Head: Normocephalic and atraumatic.  Eyes: Conjunctivae and EOM are normal. Pupils are equal, round, and reactive to light.  Psychiatric: Her speech is delayed.  Speech is aphasic  Nursing note and vitals reviewed.  Left biceps. Ashworth grade 3 Left thumb flexor. Ashworth grade 2, left finger flexors. Ashworth grade 2. Toe flexors. Ashworth grade 1, foot invertor. Ashworth grade 1-2 Hyperactive. Babinski noted    Assessment:     1. Left spastic hemiplegia due to right MCA infarct. She has a cross, aphasia as well    Plan:     Will repeat Dysport injection in about 6-7 weeks. It is possible it may last even longer than 3  months. However, the Botox only lasted her for about 2 months. We will reassess. Will do the same injection dosing as well as muscle selection.     

## 2016-04-02 ENCOUNTER — Ambulatory Visit: Payer: Self-pay | Admitting: Neurology

## 2016-04-03 ENCOUNTER — Encounter: Payer: Self-pay | Admitting: Neurology

## 2016-04-07 ENCOUNTER — Other Ambulatory Visit (HOSPITAL_COMMUNITY): Payer: Self-pay | Admitting: Internal Medicine

## 2016-04-09 ENCOUNTER — Encounter: Payer: Self-pay | Admitting: Physical Medicine & Rehabilitation

## 2016-05-03 ENCOUNTER — Encounter: Payer: Self-pay | Admitting: Neurology

## 2016-05-03 ENCOUNTER — Ambulatory Visit (INDEPENDENT_AMBULATORY_CARE_PROVIDER_SITE_OTHER): Payer: Medicare Other | Admitting: Neurology

## 2016-05-03 ENCOUNTER — Telehealth: Payer: Self-pay

## 2016-05-03 VITALS — BP 120/77 | HR 74 | Ht 65.0 in | Wt 187.2 lb

## 2016-05-03 DIAGNOSIS — G8110 Spastic hemiplegia affecting unspecified side: Secondary | ICD-10-CM | POA: Diagnosis not present

## 2016-05-03 NOTE — Patient Instructions (Signed)
I had a long d/w patient about her remote stroke,spastic hemiplegia, risk for recurrent stroke/TIAs, personally independently reviewed imaging studies and stroke evaluation results and answered questions.Continue aspirin 81 mg daily  for secondary stroke prevention and maintain strict control of hypertension with blood pressure goal below 130/90, diabetes with hemoglobin A1c goal below 6.5% and lipids with LDL cholesterol goal below 70 mg/dL. I also advised the patient to eat a healthy diet with plenty of whole grains, cereals, fruits and vegetables, exercise regularly and maintain ideal body weight .she had disability paperwork which was filled out. Followup in the future with me only as necessary

## 2016-05-03 NOTE — Progress Notes (Signed)
PATIENT: Margaret Hess DOB: Oct 07, 1965  REASON FOR VISIT: routine follow up for stroke HISTORY FROM: patient  HISTORY OF PRESENT ILLNESS: 11/03/12 PRIOR HP (PS): Margaret Hess is a 35 year Caucasian lady who was admitted with new diagnosed congestive heart failure on 06/15/12 and found to have left ventricular mural thrombus and started on anticoagulation. On 06/19/12 she was readmitted with sudden onset of left-sided weakness and difficulty speaking. She was not a candidate for TPA as she was on anticoagulation with warfarin and INR was greater than 1.7. CT of the head was unremarkable but subsequent MRI scan demonstrated her an acute infarct in the right middle cerebral artery territory with mild mass effect and midline shift. MRA showed an acute occlusion of the right M1 segment of the middle cerebral artery. Transthoracic echo on 06/16/2012 showed ejection fraction of 20% with severe global hypokinesis with a non-mobile calcified organized thrombus in the left ventricular apex. Carotid Doppler showed no significant extracranial stenosis. Urine drug screen was negative. Hemoglobin A1c was 7.0. LDL cholesterol was 64 and total cholesterol was 121 mg percent. She was continued on warfarin and was transferred initially to inpatient rehabilitation where she stayed for a month and subsequently went to a nursing home for 2 months. She has now been living at her home in [facility] with her mother. She is able to ambulate with a 4 pronged cane and uses a wheelchair only for long distances. Her speech has improved but she still has word hesitancy and word finding difficulties and cannot speak fluently. She has not gotten any improvement in the left upper extremity strength. She has a persistent left foot drop and wears a AFO. She has finished home therapies but outpatient therapies have not been set up. She complains of pain in the left arm as well as leg and is currently taking gabapentin 600 mg 3 times a day.  She states her sugars are under good control and she is tolerating warfarin without bleeding, bruising or other side effects. She has not seen Dr. Jodean Lima for the last several months. she has been approved for Medicaid.  UPDATE 04/21/13 (LL): Has had significant pain with left shoulder, Dr. Wynn Banker injected left glenohumeral in June with very good results. Recent dizziness, nausea assocaited with low BP, lisinipril, carvedilol, spironolactone, and lasix were stopped by cards. Had to go to ER 2 weeks ago for rehydration with IV fluids. Feeling much better now. Getting outpatient ST/OT/PT, has had good progress with left arm and hand splint, wearing daily. Has had around 30 lb. total weight loss, does not need insulin now, still on orals. Continues on Warfarin therapy, goal INR is 3.0. Patient denies medication side effects, with no signs of bleeding or excessive bruising. Functional capacity is much improved. She is able to live independently and has a part-time care giver for help with cleaning and preparing some meals. She is able to dress and bathe herself. She is hoping to be able to drive again; is interested in enrolling in a driving class for stroke patients through [facility].  UPDATE 02/09/14 (LL): Since last visit, she has continued to get Botox injections for spasticity with Dr. Larna Daughters with good results.  She is tolerating Warfarin well with no signs of significant bleeding or bruising.  She states blood pressure is under good control, blood pressure in the office today is 90/63.  She is fairly independent in her activities and is doing well.  She has no new complaints. Update  05/03/2016 :  She returns for follow-up after last visit with us that more than 2 years ago. The main reason for the visit is to fill out disability paperwork as her primary care physician who had previously been doing so told her she could not do it. Patient states she's had no recurrent stroke or TIA symptoms.  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 She walks with braces in the left foot using a cane. She has very little use of her left arm. She has no new complaints. She remains on aspirin for stroke prevention and states her blood pressure is well controlled. Today it is 120/79. REVIEW OF SYSTEMS: Full 14 system review of systems performed and notable only for:  Fatigue, aching muscles, walking difficulty, memory loss, speech difficulty, weakness, decreased concentration, depression, nervousness, anxiety, rash and all other systems negative  ALLERGIES: Allergies  Allergen Reactions  . Penicillins Rash    HOME MEDICATIONS: Outpatient Medications Prior to Visit  Medication Sig Dispense Refill  . acetaminophen (TYLENOL) 500 MG tablet Take 1,000 mg by mouth every 6 (six) hours as needed for pain.     Marland Kitchen. ALPRAZolam (XANAX) 0.5 MG tablet Take 0.5 mg by mouth at bedtime.    . ARIPiprazole (ABILIFY) 5 MG tablet Take one tablet daily.    Marland Kitchen. aspirin EC 81 MG tablet Take 1 tablet (81 mg total) by mouth daily. 90 tablet 3  . carvedilol (COREG) 3.125 MG tablet TAKE 1 TABLET BY MOUTH TWICE DAILY WITH MEAL 60 tablet 3  . chlorzoxazone (PARAFON) 500 MG tablet Take 500 mg by mouth 3 (three) times daily as needed for muscle spasms.    . citalopram (CELEXA) 20 MG tablet Take 40 mg by mouth every morning.     . DULoxetine (CYMBALTA) 60 MG capsule Take 60 mg by mouth daily.    Marland Kitchen. gabapentin (NEURONTIN) 800 MG tablet Take 800-1,600 mg by mouth 3 (three) times daily. Takes 1 tablet in morning, 1 tablet at lunch, and 2 tablets at bedtime    . HYDROcodone-acetaminophen (NORCO/VICODIN) 5-325 MG per tablet Take 1 tablet by mouth 2 (two) times daily.     . Linaclotide (LINZESS) 145 MCG CAPS capsule Take 1 capsule by mouth daily. Reported on 03/06/2016    . lisinopril (PRINIVIL,ZESTRIL) 5 MG tablet Take 1 tablet (5 mg total) by mouth daily. MUST HAVE FOLLOW UP APPOINTMENT FOR FURTHER REFILLS 30 tablet 2  . ONE  TOUCH ULTRA TEST test strip      No facility-administered medications prior to visit.      PHYSICAL EXAM  Vitals:   05/03/16 1042  BP: 120/77  BP Location: Right Arm  Patient Position: Sitting  Cuff Size: Normal  Pulse: 74  Weight: 187 lb 3.2 oz (84.9 kg)  Height: 5\' 5"  (1.651 m)   Body mass index is 31.15 kg/m. No exam data present   Physical Exam  General: Pleasant young Caucasian lady, in no distress. Afebrile.  Head: nontraumatic  Ears, Nose and Throat: Hearing is normal.  Neck: supple without bruit  Respiratory: clear to auscultation  Cardiovascular: no murmur or gallop  Musculoskeletal: left foot drop wears a AFO   Neurologic Exam  Mental Status: Awake, alert and oriented to time, place and person. Nonfluent speech with word hesitancy but no dysarthria.  Cranial Nerves: Eye movements are full range without nystagmus. Visual fields are full to confrontational testing. Face is asymmetric with left lower face weakness. Tongue is midline. Hearing is normal.  Motor: reveals  dense left upper extremity monoplegia with 0/5 strength and diminished tone and fixed flexion contracture of the left fingers and wrist. Tone is increased in the left lower extremity with 4/5 strength at the hip and knees with left ankle foot drop and 0/5 strength in ankle dorsiflexors.  Sensory: Touch and pinprick sensations are Diminished on the left  Coordination: impaired on the left  Gait and Station: spastic hemiplegic gait and uses her 4 pronged cane with left foot drop and AFOs.  Reflexes: Deep tendon reflexes are 2+ asymmetric brisker on theleft.    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 She has residual significant disability from mild expressive speech difficulties and spastic left hemiplegia. Vascular risk factors of cardiomyopathy with low ejection fraction,  (recently has improved to an EF of 50%), Diabetes and left ventricular mural thrombus. She also has post stroke pain in the left upper and lower extremity; relief with gabapentin and hydrocodone.   PLAN: I had a long d/w patient about her remote stroke,spastic hemiplegia, risk for recurrent stroke/TIAs, personally independently reviewed imaging studies and stroke evaluation results and answered questions.Continue aspirin 81 mg daily  for secondary stroke prevention and maintain strict control of hypertension with blood pressure goal below 130/90, diabetes with hemoglobin A1c goal below 6.5% and lipids with LDL cholesterol goal below 70 mg/dL. I also advised the patient to eat a healthy diet with plenty of whole grains, cereals, fruits and vegetables, exercise regularly and maintain ideal body weight .she had disability paperwork which was filled out. Followup in the future with me only as necessary  Delia Heady, MD  05/03/2016, 1:56 PM [facility] [address], [address] [place], [place] [address] [phone]  Note: This document was prepared with digital dictation and possible smart phrase technology. Any transcriptional errors that result from this process are unintentional.

## 2016-05-03 NOTE — Telephone Encounter (Signed)
Pt was last seen 2015. Pt had disability form done by Dr. Pearlean Brownie this year. Pt was cleared by Dr. Pearlean Brownie to not follow up. Pt only needs to follow up as needed. If form is due again, pt will need to give forms to PCP. Per Dr.Sethi if patient needs form because of her stroke she has to schedule another appt with Dr.Sethi and pay the 50.00 fee. Forms fax to [facility] dept of retirement. Pt gave a self address to Rn for forms to be mail.

## 2016-05-07 DIAGNOSIS — Z0289 Encounter for other administrative examinations: Secondary | ICD-10-CM

## 2016-05-10 ENCOUNTER — Encounter: Payer: Self-pay | Admitting: Physical Medicine & Rehabilitation

## 2016-05-10 ENCOUNTER — Encounter: Payer: Medicare Other | Attending: Physical Medicine & Rehabilitation

## 2016-05-10 ENCOUNTER — Ambulatory Visit (HOSPITAL_BASED_OUTPATIENT_CLINIC_OR_DEPARTMENT_OTHER): Payer: Medicare Other | Admitting: Physical Medicine & Rehabilitation

## 2016-05-10 VITALS — BP 127/83 | HR 80

## 2016-05-10 DIAGNOSIS — G8114 Spastic hemiplegia affecting left nondominant side: Secondary | ICD-10-CM

## 2016-05-10 DIAGNOSIS — G8110 Spastic hemiplegia affecting unspecified side: Secondary | ICD-10-CM

## 2016-05-10 DIAGNOSIS — Z8673 Personal history of transient ischemic attack (TIA), and cerebral infarction without residual deficits: Secondary | ICD-10-CM | POA: Diagnosis not present

## 2016-05-10 NOTE — Patient Instructions (Signed)
You received a Dysport injection today. You may experience soreness at the needle injection sites. Please call us if any of the injection sites turns red after a couple days or if there is any drainage. You may experience muscle weakness as a result of Dysport This would improve with time but can take several weeks to improve. The Dysport should start working in about one week. The Dysport usually last 3 months. The injection can be repeated every 3 months as needed.  

## 2016-05-10 NOTE — Progress Notes (Signed)
Dysport Injection for spasticity using needle EMG guidance  Dilution: 500 Units/2.5 ml Indication: Severe spasticity which interferes with ADL,mobility and/or  hygiene and is unresponsive to medication management and other conservative care Informed consent was obtained after describing risks and benefits of the procedure with the patient. This includes bleeding, bruising, infection, excessive weakness, or medication side effects. A REMS form is on file and signed. Needle: 30g 1" for foot, 25g 2" for Leg and arm  needle electrode Number of units per muscle FCR100   Biceps 200  Brachial radialis 100 FPL0   Lower ext FDL 100   FDB 100       Post Tib 300   EHL 100 All injections were done after obtaining appropriate EMG activity and after negative drawback for blood. The patient tolerated the procedure well. Post procedure instructions were given. A followup appointment was made.

## 2016-06-21 ENCOUNTER — Encounter: Payer: Medicare Other | Attending: Physical Medicine & Rehabilitation

## 2016-06-21 ENCOUNTER — Ambulatory Visit: Payer: Medicare Other | Admitting: Physical Medicine & Rehabilitation

## 2016-06-21 DIAGNOSIS — Z8673 Personal history of transient ischemic attack (TIA), and cerebral infarction without residual deficits: Secondary | ICD-10-CM | POA: Insufficient documentation

## 2016-06-21 DIAGNOSIS — G8110 Spastic hemiplegia affecting unspecified side: Secondary | ICD-10-CM | POA: Insufficient documentation

## 2016-08-12 ENCOUNTER — Other Ambulatory Visit (HOSPITAL_COMMUNITY): Payer: Self-pay | Admitting: Internal Medicine

## 2016-11-23 ENCOUNTER — Other Ambulatory Visit (HOSPITAL_COMMUNITY): Payer: Self-pay | Admitting: Internal Medicine

## 2017-12-03 ENCOUNTER — Other Ambulatory Visit (HOSPITAL_COMMUNITY): Payer: Self-pay | Admitting: Internal Medicine

## 2018-03-03 ENCOUNTER — Other Ambulatory Visit (HOSPITAL_COMMUNITY): Payer: Self-pay | Admitting: Internal Medicine

## 2018-03-06 ENCOUNTER — Other Ambulatory Visit (HOSPITAL_COMMUNITY): Payer: Self-pay | Admitting: Internal Medicine

## 2018-03-19 ENCOUNTER — Other Ambulatory Visit (HOSPITAL_COMMUNITY): Payer: Self-pay | Admitting: Internal Medicine

## 2018-06-17 ENCOUNTER — Other Ambulatory Visit (HOSPITAL_COMMUNITY): Payer: Self-pay | Admitting: Internal Medicine

## 2018-11-16 ENCOUNTER — Emergency Department (HOSPITAL_COMMUNITY)
Admission: EM | Admit: 2018-11-16 | Discharge: 2018-11-16 | Disposition: A | Discharge status: Home / Self Care | Payer: Medicare Other | Attending: Emergency Medicine | Admitting: Emergency Medicine

## 2018-11-16 ENCOUNTER — Other Ambulatory Visit (HOSPITAL_COMMUNITY): Payer: Self-pay | Admitting: Emergency Medicine

## 2018-11-16 ENCOUNTER — Other Ambulatory Visit: Payer: Self-pay

## 2018-11-16 ENCOUNTER — Emergency Department (HOSPITAL_COMMUNITY): Payer: Medicare Other

## 2018-11-16 ENCOUNTER — Telehealth: Payer: Self-pay | Admitting: Internal Medicine

## 2018-11-16 ENCOUNTER — Encounter (HOSPITAL_COMMUNITY): Payer: Self-pay | Admitting: Emergency Medicine

## 2018-11-16 DIAGNOSIS — Z7982 Long term (current) use of aspirin: Secondary | ICD-10-CM | POA: Diagnosis not present

## 2018-11-16 DIAGNOSIS — I5043 Acute on chronic combined systolic (congestive) and diastolic (congestive) heart failure: Secondary | ICD-10-CM | POA: Insufficient documentation

## 2018-11-16 DIAGNOSIS — I509 Heart failure, unspecified: Secondary | ICD-10-CM

## 2018-11-16 DIAGNOSIS — I110 Hypertensive heart disease with heart failure: Secondary | ICD-10-CM | POA: Diagnosis not present

## 2018-11-16 DIAGNOSIS — Z8673 Personal history of transient ischemic attack (TIA), and cerebral infarction without residual deficits: Secondary | ICD-10-CM | POA: Diagnosis not present

## 2018-11-16 DIAGNOSIS — Z79899 Other long term (current) drug therapy: Secondary | ICD-10-CM | POA: Insufficient documentation

## 2018-11-16 DIAGNOSIS — R0602 Shortness of breath: Secondary | ICD-10-CM

## 2018-11-16 DIAGNOSIS — E119 Type 2 diabetes mellitus without complications: Secondary | ICD-10-CM | POA: Diagnosis not present

## 2018-11-16 DIAGNOSIS — Z7984 Long term (current) use of oral hypoglycemic drugs: Secondary | ICD-10-CM | POA: Insufficient documentation

## 2018-11-16 LAB — CBC
HEMATOCRIT: 37.9 % (ref 36.0–46.0)
Hemoglobin: 12.1 g/dL (ref 12.0–15.0)
MCH: 29.7 pg (ref 26.0–34.0)
MCHC: 31.9 g/dL (ref 30.0–36.0)
MCV: 92.9 fL (ref 80.0–100.0)
Platelets: 305 10*3/uL (ref 150–400)
RBC: 4.08 MIL/uL (ref 3.87–5.11)
RDW: 12.3 % (ref 11.5–15.5)
WBC: 10.8 10*3/uL — AB (ref 4.0–10.5)
nRBC: 0 % (ref 0.0–0.2)

## 2018-11-16 LAB — BASIC METABOLIC PANEL
Anion gap: 8 (ref 5–15)
BUN: 22 mg/dL — AB (ref 6–20)
CHLORIDE: 104 mmol/L (ref 98–111)
CO2: 23 mmol/L (ref 22–32)
CREATININE: 0.79 mg/dL (ref 0.44–1.00)
Calcium: 8.8 mg/dL — ABNORMAL LOW (ref 8.9–10.3)
GFR calc Af Amer: >60 mL/min (ref >60)
GFR calc non Af Amer: >60 mL/min (ref >60)
Glucose, Bld: 186 mg/dL — ABNORMAL HIGH (ref 70–99)
Potassium: 4.3 mmol/L (ref 3.5–5.1)
SODIUM: 135 mmol/L (ref 135–145)

## 2018-11-16 LAB — I-STAT BETA HCG BLOOD, ED (MC, WL, AP ONLY): I-stat hCG, quantitative: <5 m[IU]/mL (ref <5)

## 2018-11-16 LAB — I-STAT TROPONIN, ED: Troponin i, poc: 0 ng/mL (ref 0.00–0.08)

## 2018-11-16 LAB — BRAIN NATRIURETIC PEPTIDE: B Natriuretic Peptide: 723.6 pg/mL — ABNORMAL HIGH (ref 0.0–100.0)

## 2018-11-16 LAB — D-DIMER, QUANTITATIVE (NOT AT ARMC): D DIMER QUANT: 0.42 ug{FEU}/mL (ref 0.00–0.50)

## 2018-11-16 MED ORDER — FUROSEMIDE 20 MG PO TABS
40.0000 mg | ORAL_TABLET | Freq: Once | ORAL | Status: AC
  Administered 2018-11-16: 40 mg via ORAL
  Filled 2018-11-16: qty 2

## 2018-11-16 MED ORDER — FUROSEMIDE 20 MG PO TABS: 20.0000 mg | ORAL_TABLET | Freq: Every day | ORAL | 0 refills | Status: DC

## 2018-11-16 NOTE — ED Notes (Signed)
Patient verbalizes understanding of discharge instructions. Opportunity for questioning and answers were provided. Armband removed by staff, pt discharged from ED. Wheeled out to lobby  

## 2018-11-16 NOTE — ED Notes (Signed)
Patient transported to X-ray 

## 2018-11-16 NOTE — Telephone Encounter (Signed)
CALLED PT TO LET HER KNOW SHOULD BE GETTING PHONE CALL FROM CHF CLINIC  AND THOUGHT PT STATED WAS GETTING IN AMBULANCE AND HUNG UP .Zack Seal

## 2018-11-16 NOTE — ED Provider Notes (Signed)
[facility] EMERGENCY DEPARTMENT Provider Note   CSN: [id number] Arrival date & time: 11/16/18  1408    History   Chief Complaint Chief Complaint  Patient presents with  . Shortness of Breath  . Palpitations    HPI Margaret Hess is a 53 y.o. female.     HPI  The patient is a 53 year old female with a known history of congestive heart failure, diabetes and hypertension.  She has also had a prior history of a stroke after having a ventricular thrombus.  She has been treated for her history of diabetes and was following up with her doctor today when she let her know that she had been short of breath for the last month with some orthopnea as well.  She was sent to the emergency department for further evaluation.  On my review of the medical record the patient has definitely had a history of congestive heart failure and in fact in 2017 in the month of July the patient had an echocardiogram which showed an ejection fraction of 45 to 50% with mild global hypokinesis, there is no regional wall motion abnormalities.  The cardiomyopathy is thought to be nonischemic based on notes.  The patient is not coughing or having any fevers and denies any significant other history.  The patient reports some increasing shortness of breath over the last month which occurs mostly at night which wakes her up from sleep but not throughout the evening and she is still sleeping on her regular bed on one pillow.  She also has some shortness of breath when she is trying to clean around the house and notes a proximal 10 pound weight gain.  Past Medical History:  Diagnosis Date  . CHF (congestive heart failure) ([place])   . Diabetes mellitus   . Hypertension   . Migraines   . Stroke [facility])     Patient Active Problem List   Diagnosis Date Noted  . Trochanteric bursitis of left hip 03/30/2015  . 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 Spastic hemiplegia affecting nondominant side ([place]) 07/05/2014  . Hypotension 04/05/2013  . Long term (current) use of anticoagulants 11/11/2012  . Palpitations 08/20/2012  . CVA (cerebral infarction) 06/26/2012  . Chronic systolic heart failure ([place]) 06/23/2012  . Cardiomyopathy ([place]) 06/21/2012  . Stroke, embolic ([place]) 81/82/9937  . Nonischemic cardiomyopathy ([place]) 06/17/2012  . LV (left ventricular) mural thrombus without MI 06/16/2012  . Chronic passive hepatic congestion 06/16/2012  . Acute respiratory failure with hypoxia ([place]) 06/16/2012  . Positive D dimer 06/16/2012  . Abnormal coagulation profile 06/16/2012  . Diabetes mellitus ([place]) 06/16/2012  . Pulmonary hypertension ([place]) 06/16/2012  . Acute diastolic heart failure, NYHA class 3 ([place]) 06/16/2012  . Acute combined systolic and diastolic heart failure ([place]) 06/16/2012  . HTN (hypertension) 06/15/2012  . Anxiety 06/15/2012  . Dyspnea on exertion 06/15/2012    Past Surgical History:  Procedure Laterality Date  . LEFT AND RIGHT HEART CATHETERIZATION WITH CORONARY ANGIOGRAM N/A 06/16/2012   Procedure: LEFT AND RIGHT HEART CATHETERIZATION WITH CORONARY ANGIOGRAM;  Surgeon: Jolaine Artist, MD;  Location: [facility] CATH LAB;  Service: Cardiovascular;  Laterality: N/A;  . WISDOM TOOTH EXTRACTION       OB History   No obstetric history on file.      Home Medications    Prior to Admission medications   Medication Sig Start Date End Date Taking? Authorizing Provider  acetaminophen (TYLENOL) 500 MG tablet Take 1,000 mg by mouth every 6 (six) hours as needed for pain.     [provider]  ALPRAZolam Duanne Moron) 0.5 MG tablet Take 0.5 mg by mouth at bedtime.    [provider]  ARIPiprazole (ABILIFY) 5 MG tablet Take one tablet daily. 10/26/15   [provider]  aspirin EC 81 MG tablet Take 1 tablet (81 mg total) by mouth daily. 03/25/14   Larey Dresser, MD   carvedilol (COREG) 3.125 MG tablet Take 1 tablet (3.125 mg total) by mouth 2 (two) times daily with a meal. Please call for OV [phone] 03/04/18   Bensimhon, Shaune Pascal, MD  chlorzoxazone (PARAFON) 500 MG tablet Take 500 mg by mouth 3 (three) times daily as needed for muscle spasms.    [provider]  citalopram (CELEXA) 20 MG tablet Take 40 mg by mouth every morning.     [provider]  DULoxetine (CYMBALTA) 60 MG capsule Take 60 mg by mouth daily.    [provider]  furosemide (LASIX) 20 MG tablet Take 1 tablet (20 mg total) by mouth daily for 10 days. 11/16/18 11/26/18  Noemi Chapel, MD  gabapentin (NEURONTIN) 800 MG tablet Take 800-1,600 mg by mouth 3 (three) times daily. Takes 1 tablet in morning, 1 tablet at lunch, and 2 tablets at bedtime    [provider]  HYDROcodone-acetaminophen (NORCO/VICODIN) 5-325 MG per tablet Take 1 tablet by mouth 2 (two) times daily.     [provider]  Linaclotide Rolan Lipa) 145 MCG CAPS capsule Take 1 capsule by mouth daily. Reported on 03/06/2016 02/04/14   [provider]  lisinopril (PRINIVIL,ZESTRIL) 5 MG tablet Take 1 tablet (5 mg total) by mouth daily. MUST HAVE FOLLOW UP APPOINTMENT FOR FURTHER REFILLS 02/22/14   Bensimhon, Shaune Pascal, MD  metFORMIN (GLUCOPHAGE-XR) 500 MG 24 hr tablet  05/01/16   [provider]  ONE TOUCH ULTRA TEST test strip  03/25/13   [provider]  zolpidem (AMBIEN) 5 MG tablet Take by mouth.  04/29/16   [provider]    Family History Family History  Problem Relation Age of Onset  . Multiple myeloma Mother   . Heart disease Father   . Hypertension Father   . Stroke Maternal Grandmother     Social History Social History   Tobacco Use  . Smoking status: Never Smoker  . Smokeless tobacco: Never Used  Substance Use Topics  . Alcohol use: Yes    Comment: 1 mixed drink every 6 months  . Drug use: No     Allergies   Penicillins   Review of  Systems Review of Systems  All other systems reviewed and are negative.    Physical Exam Updated Vital Signs BP (!) 137/103   Pulse (!) 104   Resp 15   Wt 84.9 kg   SpO2 97%   BMI 31.15 kg/m   Physical Exam Vitals signs and nursing note reviewed.  Constitutional:      General: She is not in acute distress.    Appearance: She is well-developed.  HENT:     Head: Normocephalic and atraumatic.     Mouth/Throat:     Pharynx: No oropharyngeal exudate.  Eyes:     General: No scleral icterus.       Right eye: No discharge.        Left eye: No discharge.     Conjunctiva/sclera: Conjunctivae normal.     Pupils: Pupils are  equal, round, and reactive to light.  Neck:     Musculoskeletal: Normal range of motion and neck supple.     Thyroid: No thyromegaly.     Vascular: No JVD.  Cardiovascular:     Rate and Rhythm: Normal rate and regular rhythm.     Heart sounds: Normal heart sounds. No murmur. No friction rub. No gallop.   Pulmonary:     Effort: Pulmonary effort is normal. No respiratory distress.     Breath sounds: Normal breath sounds. No wheezing or rales.  Abdominal:     General: Bowel sounds are normal. There is no distension.     Palpations: Abdomen is soft. There is no mass.     Tenderness: There is no abdominal tenderness.  Musculoskeletal: Normal range of motion.        General: No tenderness.  Lymphadenopathy:     Cervical: No cervical adenopathy.  Skin:    General: Skin is warm and dry.     Findings: No erythema or rash.  Neurological:     Mental Status: She is alert.     Coordination: Coordination normal.     Comments: Patient has some weakness in her left arm and left leg as well as a slight expressive aphasia, this is from her prior stroke  Psychiatric:        Behavior: Behavior normal.      ED Treatments / Results  Labs (all labs ordered are listed, but only abnormal results are displayed) Labs Reviewed  BASIC METABOLIC PANEL - Abnormal; Notable  for the following components:      Result Value   Glucose, Bld 186 (*)    BUN 22 (*)    Calcium 8.8 (*)    All other components within normal limits  CBC - Abnormal; Notable for the following components:   WBC 10.8 (*)    All other components within normal limits  BRAIN NATRIURETIC PEPTIDE - Abnormal; Notable for the following components:   B Natriuretic Peptide 723.6 (*)    All other components within normal limits  D-DIMER, QUANTITATIVE (NOT AT [facility])  I-STAT TROPONIN, ED  I-STAT BETA HCG BLOOD, ED ([facility], [facility], [facility] ONLY)    EKG EKG Interpretation  Date/Time:  Monday November 16 2018 14:09:23 EDT Ventricular Rate:  112 PR Interval:    QRS Duration: 85 QT Interval:  342 QTC Calculation: 467 R Axis:   12 Text Interpretation:  Sinus tachycardia Since last tracing rate faster Confirmed by Noemi Chapel [phone]) on 11/16/2018 3:08:35 PM   Radiology Dg Chest 2 View  Result Date: 11/16/2018 CLINICAL DATA:  Shortness of breath for 1 month EXAM: CHEST - 2 VIEW COMPARISON:  04/05/2013 FINDINGS: Bilateral diffuse interstitial thickening and peribronchial cuffing most concerning for bronchitis. There is no focal consolidation. There is no pleural effusion or pneumothorax. The heart and mediastinal contours are unremarkable. The osseous structures are unremarkable. IMPRESSION: Bilateral diffuse interstitial thickening and peribronchial cuffing most concerning for bronchitis. Electronically Signed   By: Kathreen Devoid   On: 11/16/2018 15:36    Procedures Procedures (including critical care time)  Medications Ordered in ED Medications  furosemide (LASIX) tablet 40 mg (40 mg Oral Given 11/16/18 1644)     Initial Impression / Assessment and Plan / ED Course  I have reviewed the triage vital signs and the nursing notes.  Pertinent labs & imaging results that were available during my care of the patient were reviewed by me and considered in my medical decision making (see  chart for details).   Clinical Course as of Nov 16 1727  Mon Nov 16, 2018  1621 BNP is elevated at 700, the d-dimer is negative, the chest x-ray shows some peribronchial cuffing and airway thickening but no signs of pneumonia or pneumothorax.  Heart rate is 103 on my exam and oxygenation is normal.  I suspect that her progressive shortness of breath is more related to congestive heart failure than it is to an acute coronary syndrome especially given her nonischemic EKG and negative troponin.   [BM]  1652 Discussed with cardiology team, they agree with Lasix and follow-up this week.   [BM]    Clinical Course User Index [BM] Noemi Chapel, MD       The patient's exam is reassuring, she reports that her shortness of breath is only at night at certain times but not through the night.  She is still sleeping in bed on one pillow and has not had to increase this, she does not sleep in a chair, she does have some exertional shortness of breath if she walks around her house occasionally.  She does not have fevers coughing or swelling of her legs though she does endorse some weight gain.  Her labs have been very unremarkable except for a slightly elevated blood sugar.  Her EKG is nonischemic but mildly tachycardic.  She is not on any anticoagulation which makes me somewhat concerned for pulmonary embolism given her relative immobilization of the left side.  At this time the patient will need a d-dimer, BNP.  Pt informed of the plan and is agreeabgle - pulse is slightly above 100, on my last exam , 103, no hypotentoin, no hypoxia and no source of sx other than what appears to be some mild worsening CHF.  Pt stable for d/c and agreeable to return if sx worsen.  Final Clinical Impressions(s) / ED Diagnoses   Final diagnoses:  Acute on chronic congestive heart failure, unspecified heart failure type ([place])  Shortness of breath    ED Discharge Orders         Ordered    furosemide (LASIX) 20 MG tablet  Daily     11/16/18 1723            Noemi Chapel, MD 11/16/18 1729

## 2018-11-16 NOTE — Telephone Encounter (Signed)
Pt in ER

## 2018-11-16 NOTE — ED Triage Notes (Signed)
Pt in via [facility] with sob, palpitations (pt states x 1 mo intermittently). Pt went to PCP today for monitoring of DM, there she c/o her sob and intermittent cp - doc told her to go to ER. Denies any n/v, fevers or dizziness. Hx of CVA 5 yrs ago with L side and speech deficits

## 2018-11-16 NOTE — Telephone Encounter (Signed)
LMTCB ./CY 

## 2018-11-16 NOTE — Telephone Encounter (Signed)
Left message that need to call CHF clinic for appt will forward message to [facility] RN./cy

## 2018-11-16 NOTE — Discharge Instructions (Signed)
Thank you for letting us take care of you today!  Please obtain all of your results from medical records or have your doctors office obtain the results - share them with your doctor - you should be seen at your doctors office in the next 2 days. Call today to arrange your follow up. Take the medications as prescribed. Please review all of the medicines and only take them if you do not have an allergy to them. Please be aware that if you are taking birth control pills, taking other prescriptions, ESPECIALLY ANTIBIOTICS may make the birth control ineffective - if this is the case, either do not engage in sexual activity or use alternative methods of birth control such as condoms until you have finished the medicine and your family doctor says it is OK to restart them. If you are on a blood thinner such as COUMADIN, be aware that any other medicine that you take may cause the coumadin to either work too much, or not enough - you should have your coumadin level rechecked in next 7 days if this is the case.  ?  It is also a possibility that you have an allergic reaction to any of the medicines that you have been prescribed - Everybody reacts differently to medications and while MOST people have no trouble with most medicines, you may have a reaction such as nausea, vomiting, rash, swelling, shortness of breath. If this is the case, please stop taking the medicine immediately and contact your physician.   If you were given a medication in the ED such as percocet, vicodin, or morphine, be aware that these medicines are sedating and may change your ability to take care of yourself adequately for several hours after being given this medicines - you should not drive or take care of small children if you were given this medicine in the Emergency Department or if you have been prescribed these types of medicines. ?   You should return to the ER IMMEDIATELY if you develop severe or worsening symptoms.   Take Lasix one  tablet daily for 10 days Have your doctor recheck you in the next week  ER for worsening symptoms including increased shortness of breath / or chest pain or fevers I talked to the cardiology team who is OK with you going home and following up in the clinic.

## 2018-11-16 NOTE — Telephone Encounter (Signed)
New Message:   PA calling concerning a patient that needs to be seen ASAP Patient 10 pound weight gain over 2 weeks and SOB while laying down. PA would like for patient to be seen ASAP today or tomorrw. Please call PA office.[phone]

## 2018-11-25 ENCOUNTER — Telehealth: Payer: Self-pay | Admitting: Cardiology

## 2018-11-25 ENCOUNTER — Encounter: Payer: Self-pay | Admitting: *Deleted

## 2018-11-25 NOTE — Telephone Encounter (Signed)
COVID-19 Pre-Screening Questions: °  °· Do you currently have a fever?  no °·   °· Have you recently travelled on a cruise, internationally, or to [place], [place], [place], [place], [place], or [place], [place] ([employer]) ?   no °·   °· Have you been in contact with someone that is currently pending confirmation of Covid19 testing or has been confirmed to have the Covid19 virus?  no °·   °Are you currently experiencing fatigue or cough? no °   °  °  °  ° ° °

## 2018-11-26 ENCOUNTER — Other Ambulatory Visit: Payer: Self-pay

## 2018-11-26 ENCOUNTER — Telehealth (INDEPENDENT_AMBULATORY_CARE_PROVIDER_SITE_OTHER): Payer: Medicare Other | Admitting: Cardiology

## 2018-11-26 ENCOUNTER — Telehealth: Payer: Self-pay | Admitting: Cardiology

## 2018-11-26 DIAGNOSIS — I5042 Chronic combined systolic (congestive) and diastolic (congestive) heart failure: Secondary | ICD-10-CM | POA: Diagnosis not present

## 2018-11-26 DIAGNOSIS — I110 Hypertensive heart disease with heart failure: Secondary | ICD-10-CM | POA: Diagnosis not present

## 2018-11-26 DIAGNOSIS — E119 Type 2 diabetes mellitus without complications: Secondary | ICD-10-CM | POA: Diagnosis not present

## 2018-11-26 MED ORDER — FUROSEMIDE 20 MG PO TABS: 20.0000 mg | ORAL_TABLET | Freq: Every day | ORAL | 0 refills | Status: DC

## 2018-11-26 MED ORDER — LISINOPRIL 5 MG PO TABS: 5.0000 mg | ORAL_TABLET | Freq: Every day | ORAL | 0 refills | Status: DC

## 2018-11-26 NOTE — Progress Notes (Signed)
Virtual Visit via Telephone Note    Evaluation Performed:  Follow-up visit  This visit type was conducted due to national recommendations for restrictions regarding the COVID-19 Pandemic (e.g. social distancing).  This format is felt to be most appropriate for this patient at this time.  All issues noted in this document were discussed and addressed.  No physical exam was performed (except for noted visual exam findings with Video Visits).  Please refer to the patient's chart (MyChart message for video visits and phone note for telephone visits) for the patient's consent to telehealth for [facility].  Date:  11/26/2018   ID:  Margaret Hess, DOB 01/30/1966, MRN [id number]  Patient Location:  [place] [address]   Provider location:   [place]  PCP:  Elisabeth Cara, [place]  Cardiologist:  No primary care provider on file. Dr Bettina Gavia Electrophysiologist:  None   Chief Complaint:  Fu for heart failure  History of Present Illness:    Margaret Hess is a 53 y.o. female who presents via audio/video conferencing for a telehealth visit today in FU to [facility] ED visit 11/16/18.  The patient does not symptoms concerning for COVID-19 infection (fever, chills, cough, or new SHORTNESS OF BREATH).   Chest x-ray 11/16/2018 read as bronchitis not heart failure  IMPRESSION: Bilateral diffuse interstitial thickening and peribronchial cuffing most concerning for bronchitis.  Recent Labs:   Troponin undetectable BNP 724 previous 4 years ago  Margaret Hess is a 53 y.o. 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  In a [place] emergency room 11/16/2018 with shortness of breath was found to be mildly decompensated heart failure with elevated proBNP level greater than 700.  This felt appropriate for outpatient care and was discharged from the hospital on oral diuretic.  Her baseline medications  consisted of carvedilol in a month dose 3.125 mg twice daily lisinopril 5 mg daily at the time of her last heart failure visit.  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  Subsequent ejection fraction improved July 2015 EF 50 to 55% with no thrombus and most recent echocardiogram 2017 ejection fraction 45 to 50%.  Not anticoagulated the time of her last office visit.  Medical problems include hypertensive heart disease and diabetes.  She has not been seen by heart failure in over 2-1/2 years lost her prescription for lisinopril and has not been on a diuretic for a long period of time.  Slowly she is gained more than 10 pounds became edematous short of breath was seen in the emergency room treated and is improved.  She has mild residual shortness of breath more than usual activities New York Heart Association class II no longer has orthopnea or edema.  Unfortunately she no longer weighs herself should begin and does not fully sodium restrict.  She is also taking gabapentin that can cause intense sodium retention.  We discussed the need for ongoing diuretic therapy for heart failure and the need to resume ACE inhibitor with uncontrolled hypertension in the emergency room.  Her chest x-ray was read as bronchitis but she has no fever cough wheezing or sputum production.  She is due to be seen in diabetic follow-up in a few weeks and I will ask her PCP to check renal function and proBNP level it was elevated in the emergency  room and should decrease at least 50% of response to treatment.  If systolic remains greater than 140 double the dose of ACE inhibitor.  Once this current coronavirus infection is resolved in our community we will recheck an echocardiogram and make a decision about additional therapy such as transition ACE inhibitor to Entresto. . Prior CV studies:   The following studies were reviewed  today:    Past Medical History:  Diagnosis Date   CHF (congestive heart failure) ([facility])    Diabetes mellitus    Hypertension    Migraines    Stroke [facility])    Past Surgical History:  Procedure Laterality Date   LEFT AND RIGHT HEART CATHETERIZATION WITH CORONARY ANGIOGRAM N/A 06/16/2012   Procedure: LEFT AND RIGHT HEART CATHETERIZATION WITH CORONARY ANGIOGRAM;  Surgeon: Jolaine Artist, MD;  Location: [facility] CATH LAB;  Service: Cardiovascular;  Laterality: N/A;   WISDOM TOOTH EXTRACTION       Current Meds  Medication Sig   ALPRAZolam (XANAX) 0.5 MG tablet Take 0.5 mg by mouth at bedtime.   ARIPiprazole (ABILIFY) 5 MG tablet Take one tablet daily.   aspirin EC 81 MG tablet Take 1 tablet (81 mg total) by mouth daily.   citalopram (CELEXA) 20 MG tablet Take 40 mg by mouth every morning.    DULoxetine (CYMBALTA) 60 MG capsule Take 60 mg by mouth daily.   furosemide (LASIX) 20 MG tablet Take 1 tablet (20 mg total) by mouth daily for 10 days.   gabapentin (NEURONTIN) 800 MG tablet Take 800-1,600 mg by mouth 3 (three) times daily. Takes 1 tablet in morning, 1 tablet at lunch, and 2 tablets at bedtime   HYDROcodone-acetaminophen (NORCO/VICODIN) 5-325 MG per tablet Take 1 tablet by mouth 2 (two) times daily.    Linaclotide (LINZESS) 145 MCG CAPS capsule Take 1 capsule by mouth daily. Reported on 03/06/2016   metFORMIN (GLUCOPHAGE-XR) 500 MG 24 hr tablet    zolpidem (AMBIEN) 5 MG tablet Take by mouth.      Allergies:   Penicillins   Social History   Tobacco Use   Smoking status: Never Smoker   Smokeless tobacco: Never Used  Substance Use Topics   Alcohol use: Yes    Comment: 1 mixed drink every 6 months   Drug use: No     Family Hx: The patient's family history includes Heart disease in her father; Hypertension in her father; Multiple myeloma in her mother; Stroke in her maternal grandmother.  ROS:   Please see the history of present illness.     All other  systems reviewed and are negative.   Labs/Other Tests and Data Reviewed:    Recent Labs: 11/16/2018: B Natriuretic Peptide 723.6; BUN 22; Creatinine, Ser 0.79; Hemoglobin 12.1; Platelets 305; Potassium 4.3; Sodium 135   Recent Lipid Panel Lab Results  Component Value Date/Time   CHOL 121 06/17/2012 04:55 AM   TRIG 169 (H) 06/17/2012 04:55 AM   HDL 23 (L) 06/17/2012 04:55 AM   CHOLHDL 5.3 06/17/2012 04:55 AM   LDLCALC 64 06/17/2012 04:55 AM    Wt Readings from Last 3 Encounters:  11/16/18 187 lb 2.7 oz (84.9 kg)  05/03/16 187 lb 3.2 oz (84.9 kg)  03/06/16 185 lb 8 oz (84.1 kg)     Exam:    Vital Signs:  There were no vitals taken for this visit.    female in no acute distress.   ASSESSMENT & PLAN:    1.  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  Tele-visit 4 weeks and when safe we will do an outpatient echo if EF is less than 40 transition ACE inhibitor to Entresto. Appointment 2 weeks with her PCP and she will need renal function rechecked with a diuretic and ACE inhibitor and please check proBNP level which should be decreased at least 50%. 2.  Emergency room 137/103- inhibitor continue diuretic.  Visit with her PCP and blood pressure remains greater than 01/03/5464 systolic uptitrate ACE inhibitor 3.  With  heart failure would benefit from SGLT2 inhibitor if additional treatment is needed   COVID-19 Education: The signs and symptoms of COVID-19 were discussed with the patient and how to seek care for testing (follow up with PCP or arrange E-visit).  The importance of social distancing was discussed today.  Patient Risk:   After full review of this patients clinical status, I feel that they are at least moderate risk at this time.  Time:   Today, I have spent 58 minutes minutes with  the patient with telehealth technology discussing her heart failure noncompliance with medical therapy stretching on pulmonary infection however she has no cough wheezing or sputum production and the need for resetting compliance with sodium restriction daily weights compliance of medications continue with diuretic and reinstituting ACE inhibitor and creating a plan to safely recheck renal function at follow-up with PCP office along with proBNP level.  And coronavirus has receded we will recheck her ejection fraction and make decisions about additional therapy such as Entresto but I am comfortable that she will not end up being admitted to the hospital..     Medication Adjustments/Labs and Tests Ordered: Current medicines are reviewed at length with the patient today.  Concerns regarding medicines are outlined above.  Tests Ordered: No orders of the defined types were placed in this encounter.  Medication Changes: No orders of the defined types were placed in this encounter.   Disposition:  Telehealth 1 month  Signed, Shirlee More, MD  11/26/2018 4:09 PM    [facility]

## 2018-11-26 NOTE — Telephone Encounter (Signed)
YOUR CARDIOLOGY TEAM HAS ARRANGED FOR AN E-VISIT FOR YOUR APPOINTMENT - PLEASE REVIEW IMPORTANT INFORMATION BELOW SEVERAL DAYS PRIOR TO YOUR APPOINTMENT  Due to the recent COVID-19 pandemic, we are transitioning in-person office visits to tele-medicine visits in an effort to decrease unnecessary exposure to our patients and staff. Medicare and most insurances are covering these visits without a copay needed. You will need a working email and a smartphone or computer with a camera and microphone. For patients that do not have these items, we can still complete the visit using a telephone but do prefer video when possible. If possible, we also ask that you have a blood pressure cuff and scale at home to measure your blood pressure, heart rate and weight prior to your scheduled appointment. Patients with clinical needs that need an in-person evaluation and testing will still be able to come to the office if absolutely necessary. If you have any questions, feel free to call our office.        CONSENT FOR TELE-HEALTH VISIT - PLEASE RVIEW  I hereby voluntarily request, consent and authorize [facility] and its employed or contracted physicians, physician assistants, nurse practitioners or other licensed health care professionals (the Practitioner), to provide me with telemedicine health care services (the Services") as deemed necessary by the treating Practitioner. I acknowledge and consent to receive the Services by the Practitioner via telemedicine. I understand that the telemedicine visit will involve communicating with the Practitioner through live audiovisual communication technology and the disclosure of certain medical information by electronic transmission. I acknowledge that I have been given the opportunity to request an in-person assessment or other available alternative prior to the telemedicine visit and am voluntarily participating in the telemedicine visit.  I understand that I have the  right to withhold or withdraw my consent to the use of telemedicine in the course of my care at any time, without affecting my right to future care or treatment, and that the Practitioner or I may terminate the telemedicine visit at any time. I understand that I have the right to inspect all information obtained and/or recorded in the course of the telemedicine visit and may receive copies of available information for a reasonable fee.  I understand that some of the potential risks of receiving the Services via telemedicine include:   Delay or interruption in medical evaluation due to technological equipment failure or disruption;  Information transmitted may not be sufficient (e.g. poor resolution of images) to allow for appropriate medical decision making by the Practitioner; and/or   In rare instances, security protocols could fail, causing a breach of personal health information.  Furthermore, I acknowledge that it is my responsibility to provide information about my medical history, conditions and care that is complete and accurate to the best of my ability. I acknowledge that Practitioner's advice, recommendations, and/or decision may be based on factors not within their control, such as incomplete or inaccurate data provided by me or distortions of diagnostic images or specimens that may result from electronic transmissions. I understand that the practice of medicine is not an exact science and that Practitioner makes no warranties or guarantees regarding treatment outcomes. I acknowledge that I will receive a copy of this consent concurrently upon execution via email to the email address I last provided but may also request a printed copy by calling the office of [facility].    I understand that my insurance will be billed for this visit.   I have read or had  this consent read to me.  I understand the contents of this consent, which adequately explains the benefits and risks of the Services  being provided via telemedicine.   I have been provided ample opportunity to ask questions regarding this consent and the Services and have had my questions answered to my satisfaction.  I give my informed consent for the services to be provided through the use of telemedicine in my medical care  By participating in this telemedicine visit I agree to the above. Consent given by patient 11/26/2018/pp

## 2018-11-26 NOTE — Patient Instructions (Addendum)
Medication Instructions:   Your physician recommends that you continue on your current medications as directed. Please refer to the Current Medication list given to you today.    If you need a refill on your cardiac medications before your next appointment, please call your pharmacy.   Lab work:  None  If you have labs (blood work) drawn today and your tests are completely normal, you will receive your results only by: Marland Kitchen MyChart Message (if you have MyChart) OR . A paper copy in the mail If you have any lab test that is abnormal or we need to change your treatment, we will call you to review the results.  Testing/Procedures:  None  Additional Instructions:  Weigh yourself daily and record results   Follow-Up: At [facility], you and your health needs are our priority.  As part of our continuing mission to provide you with exceptional heart care, we have created designated Provider Care Teams.  These Care Teams include your primary Cardiologist (physician) and Advanced Practice Providers (APPs -  Physician Assistants and Nurse Practitioners) who all work together to provide you with the care you need, when you need it.  . You will need a follow up appointment in 1 month.  Televisit scheduled for 12/29/2018 at 8:20 am

## 2018-11-27 ENCOUNTER — Other Ambulatory Visit: Payer: Self-pay

## 2018-11-27 MED ORDER — LISINOPRIL 5 MG PO TABS: 5.0000 mg | ORAL_TABLET | Freq: Every day | ORAL | 1 refills | Status: DC

## 2018-11-27 MED ORDER — FUROSEMIDE 20 MG PO TABS: 20.0000 mg | ORAL_TABLET | Freq: Every day | ORAL | 1 refills | Status: DC

## 2018-12-29 ENCOUNTER — Telehealth (INDEPENDENT_AMBULATORY_CARE_PROVIDER_SITE_OTHER): Payer: Medicare Other | Admitting: Cardiology

## 2018-12-29 ENCOUNTER — Encounter: Payer: Self-pay | Admitting: Cardiology

## 2018-12-29 ENCOUNTER — Other Ambulatory Visit: Payer: Self-pay

## 2018-12-29 DIAGNOSIS — I110 Hypertensive heart disease with heart failure: Secondary | ICD-10-CM

## 2018-12-29 DIAGNOSIS — I5042 Chronic combined systolic (congestive) and diastolic (congestive) heart failure: Secondary | ICD-10-CM | POA: Diagnosis not present

## 2018-12-29 DIAGNOSIS — E119 Type 2 diabetes mellitus without complications: Secondary | ICD-10-CM

## 2018-12-29 NOTE — Patient Instructions (Addendum)
Medication Instructions:  Your physician recommends that you continue on your current medications as directed. Please refer to the Current Medication list given to you today.  If you need a refill on your cardiac medications before your next appointment, please call your pharmacy.   Lab work: None  If you have labs (blood work) drawn today and your tests are completely normal, you will receive your results only by: Marland Kitchen MyChart Message (if you have MyChart) OR . A paper copy in the mail If you have any lab test that is abnormal or we need to change your treatment, we will call you to review the results.  Testing/Procedures: Your physician has requested that you have an echocardiogram. Echocardiography is a painless test that uses sound waves to create images of your heart. It provides your doctor with information about the size and shape of your heart and how well your heart's chambers and valves are working. This procedure takes approximately one hour. There are no restrictions for this procedure. This will be done at the [facility] ([address] [place], [place] [address]) on the 1st floor Imaging Services [address] on Wednesday, 01/20/2019, at 8:15 am.   Follow-Up: At [facility], you and your health needs are our priority.  As part of our continuing mission to provide you with exceptional heart care, we have created designated Provider Care Teams.  These Care Teams include your primary Cardiologist (physician) and Advanced Practice Providers (APPs -  Physician Assistants and Nurse Practitioners) who all work together to provide you with the care you need, when you need it. You will need a follow up virtual appointment in 6 weeks: Tuesday, 02/09/2019, at 9:00 am.     **Please purchase a blood pressure monitor. Check your blood pressure and weigh yourself daily at the same time each day.    Heart Failure  Weigh yourself every morning when you first wake up and record on a  calender or note pad, bring this to your office visits. Using a pill tender can help with taking your medications consistently.  Limit your fluid intake to 2 liters daily  Limit your sodium intake to less than 2-3 grams daily. Ask if you need dietary teaching.  If you gain more than 3 pounds (from your dry weight ), double your dose of diuretic for the day.  If you gain more than 5 pounds (from your dry weight), double your dose of lasix and call your heart failure doctor.  Please do not smoke tobacco since it is very bad for your heart.  Please do not drink alcohol since it can worsen your heart failure.Also avoid OTC nonsteroidal drugs, such as advil, aleve and motrin.  Try to exercise for at least 30 minutes every day because this will help your heart be more efficient. You may be eligible for supervised cardiac rehab, ask your physician.     Echocardiogram An echocardiogram is a procedure that uses painless sound waves (ultrasound) to produce an image of the heart. Images from an echocardiogram can provide important information about:  Signs of coronary artery disease (CAD).  Aneurysm detection. An aneurysm is a weak or damaged part of an artery wall that bulges out from the normal force of blood pumping through the body.  Heart size and shape. Changes in the size or shape of the heart can be associated with certain conditions, including heart failure, aneurysm, and CAD.  Heart muscle function.  Heart valve function.  Signs of a  past heart attack.  Fluid buildup around the heart.  Thickening of the heart muscle.  A tumor or infectious growth around the heart valves. Tell a health care provider about:  Any allergies you have.  All medicines you are taking, including vitamins, herbs, eye drops, creams, and over-the-counter medicines.  Any blood disorders you have.  Any surgeries you have had.  Any medical conditions you have.  Whether you are pregnant or may be  pregnant. What are the risks? Generally, this is a safe procedure. However, problems may occur, including:  Allergic reaction to dye (contrast) that may be used during the procedure. What happens before the procedure? No specific preparation is needed. You may eat and drink normally. What happens during the procedure?   An IV tube may be inserted into one of your veins.  You may receive contrast through this tube. A contrast is an injection that improves the quality of the pictures from your heart.  A gel will be applied to your chest.  A wand-like tool (transducer) will be moved over your chest. The gel will help to transmit the sound waves from the transducer.  The sound waves will harmlessly bounce off of your heart to allow the heart images to be captured in real-time motion. The images will be recorded on a computer. The procedure may vary among health care providers and hospitals. What happens after the procedure?  You may return to your normal, everyday life, including diet, activities, and medicines, unless your health care provider tells you not to do that. Summary  An echocardiogram is a procedure that uses painless sound waves (ultrasound) to produce an image of the heart.  Images from an echocardiogram can provide important information about the size and shape of your heart, heart muscle function, heart valve function, and fluid buildup around your heart.  You do not need to do anything to prepare before this procedure. You may eat and drink normally.  After the echocardiogram is completed, you may return to your normal, everyday life, unless your health care provider tells you not to do that. This information is not intended to replace advice given to you by your health care provider. Make sure you discuss any questions you have with your health care provider. Document Released: 08/16/2000 Document Revised: 09/21/2016 Document Reviewed: 09/21/2016 Elsevier Interactive  Patient Education  2019 [employer].

## 2018-12-29 NOTE — Progress Notes (Signed)
Virtual Visit via Video Note   This visit type was conducted due to national recommendations for restrictions regarding the COVID-19 Pandemic (e.g. social distancing) in an effort to limit this patient's exposure and mitigate transmission in our community.  Due to her co-morbid illnesses, this patient is at least at moderate risk for complications without adequate follow up.  This format is felt to be most appropriate for this patient at this time.  All issues noted in this document were discussed and addressed.  A limited physical exam was performed with this format.  Please refer to the patient's chart for her consent to telehealth for [facility].   Evaluation Performed:  Follow-up visit  Date:  12/29/2018   ID:  Tammera, Engert [birth year], MRN [id number]  Patient Location: Home Provider Location: Home  PCP:  Belva Bertin [place], PA-C Please review her DM meds and consider transition from Actos to SGLT2 inhibitor  Cardiologist:  No primary care provider on file. Dr Bettina Gavia Electrophysiologist:  None   Chief Complaint:  Heart failure  History of Present Illness:    DELLAMAE ROSAMILIA is a 53 y.o. female with a hx of heart failure mildly reduced ejection fraction 45 to 50% last seen 11/26/18 with decompensated heart failure.   Ref Range & Units 87moago 457yrgo  B Natriuretic Peptide 0.0 - 100.0 pg/mL 723.6High   18.5   Comment: [facility] [place] [id number] Her history is noteworthy for initially very severe dilated cardiomyopathy ejection fraction less than 20% with left ventricular thrombus and stroke. Subsequent ejection fraction improved July 2015 EF 50 to 55% with no thrombus and most recent echocardiogram 2017 ejection fraction 45 to 50%. Not anticoagulated the time of her last office visit. Medical problems include hypertensive heart disease and diabetes.   She is feeling better and has no edema orthopnea shortness of  breath chest pain palpitation or syncope and feels normal.  Unfortunately she does not check her blood sugar does not weigh herself and is at risk for doing poorly with the diabetes and heart failure.  We had a nice discussion about home self-management of heart failure sodium restriction and the need to weigh daily record and she voiced understanding.  I am concerned she takes Actos as diabetes and heart failure and if I knew what her diabetic control was I would discontinue the drug being unsure I asked her to discuss with her PCP next visit if alternative therapy such as Victoza and related agents or more specifically SGLT2 inhibitors could be used as they are beneficial with heart disease.  The patient does not have symptoms concerning for COVID-19 infection (fever, chills, cough, or new shortness of breath).    Past Medical History:  Diagnosis Date  . [facility] [facility] [place]  . Diabetes mellitus   . Hypertension   . Migraines   . [facility]   Past Surgical History:  Procedure Laterality Date  . LEFT AND RIGHT HEART CATHETERIZATION WITH CORONARY ANGIOGRAM N/A 06/16/2012   Procedure: LEFT AND RIGHT HEART CATHETERIZATION WITH CORONARY ANGIOGRAM;  Surgeon: DaJolaine ArtistMD;  [facility];  Service: Cardiovascular;  Laterality: N/A;  . WISDOM TOOTH EXTRACTION       Current Meds  Medication Sig  . phenazopyridine (PYRIDIUM) 200 MG tablet Take 1 tablet by mouth 3 (three) times daily as needed.  . pioglitazone (ACTOS) 45 MG tablet Take 1  tablet by mouth daily.     Allergies:   Penicillins   Social History   Tobacco Use  . Smoking status: Never Smoker  . Smokeless tobacco: Never Used  Substance Use Topics  . Alcohol use: Yes    Comment: 1 mixed drink every 6 months  . Drug use: No     Family Hx: The patient's family history includes Heart disease in her father; Hypertension in her father; Multiple myeloma in her mother; Stroke in her maternal  grandmother.  ROS:   Please see the history of present illness.     All other systems reviewed and are negative.   Prior CV studies:   The following studies were reviewed today:    Labs/Other Tests and Data Reviewed:    EKG:  No ECG reviewed.  Recent Labs: 11/16/2018: B Natriuretic Peptide 723.6; BUN 22; Creatinine, Ser 0.79; Hemoglobin 12.1; Platelets 305; Potassium 4.3; Sodium 135   Recent Lipid Panel Lab Results  Component Value Date/Time   CHOL 121 06/17/2012 04:55 AM   TRIG 169 (H) 06/17/2012 04:55 AM   HDL 23 (L) 06/17/2012 04:55 AM   CHOLHDL 5.3 06/17/2012 04:55 AM   LDLCALC 64 06/17/2012 04:55 AM    Wt Readings from Last 3 Encounters:  11/16/18 187 lb 2.7 oz (84.9 kg)  05/03/16 187 lb 3.2 oz (84.9 kg)  03/06/16 185 lb 8 oz (84.1 kg)     Objective:    Vital Signs:  There were no vitals taken for this visit.   VITAL SIGNS:  reviewed GEN:  no acute distress EYES:  sclerae anicteric, EOMI - Extraocular Movements Intact RESPIRATORY:  normal respiratory effort, symmetric expansion CARDIOVASCULAR:  no peripheral edema SKIN:  no rash, lesions or ulcers. MUSCULOSKELETAL:  no obvious deformities. NEURO:  alert and oriented x 3, no obvious focal deficit PSYCH:  normal affect  ASSESSMENT & PLAN:    1. Heart failure chronic systolic and diastolic symptomatically improved New York Heart Association class I and better compensated.  Continue her current treatment I am concerned regarding noncompliance and Actos therapy.  We had a nice discussion she voiced understanding and agreement about home self-management.  We need to check an echocardiogram for ejection fraction and presence or absence of LV thrombus and make a decision about transition to [facility] if ejection fraction less than 40.  I will plan to see her in the office face-to-face after the echocardiogram for follow-up visit 2. Improved continue guideline directed therapy and I asked her to get a blood pressure  cuff and record 3. Type 2 diabetes, she is noncompliant not following her blood sugars and I asked her to discuss with her PCP transition from Actos to SGLT2 inhibitor in particular.  COVID-19 Education: The signs and symptoms of COVID-19 were discussed with the patient and how to seek care for testing (follow up with PCP or arrange E-visit).  The importance of social distancing was discussed today.  Time:   Today, I have spent 25 minutes with the patient with telehealth technology discussing the above problems.     Medication Adjustments/Labs and Tests Ordered: Current medicines are reviewed at length with the patient today.  Concerns regarding medicines are outlined above.   Tests Ordered: No orders of the defined types were placed in this encounter.   Medication Changes: No orders of the defined types were placed in this encounter.   Disposition:  Follow up in 1 month(s) after echo  Signed, Shirlee More, MD  12/29/2018 7:25 AM  [facility]

## 2019-01-20 ENCOUNTER — Ambulatory Visit (HOSPITAL_BASED_OUTPATIENT_CLINIC_OR_DEPARTMENT_OTHER): Admission: RE | Admit: 2019-01-20 | Payer: Medicare Other | Source: Ambulatory Visit

## 2019-02-08 NOTE — Progress Notes (Signed)
Virtual Visit via Video Note   This visit type was conducted due to national recommendations for restrictions regarding the COVID-19 Pandemic (e.g. social distancing) in an effort to limit this patient's exposure and mitigate transmission in our community.  Due to her co-morbid illnesses, this patient is at least at moderate risk for complications without adequate follow up.  This format is felt to be most appropriate for this patient at this time.  All issues noted in this document were discussed and addressed.  A limited physical exam was performed with this format.  Please refer to the patient's chart for her consent to telehealth for [facility].   Date:  02/09/2019   ID:  Margaret Hess, DOB 1966-07-25, MRN [id number]  Patient Location: Home Provider Location: Office  PCP:  Rochel Brome, MD Please transition her off Actos with heart failure Cardiologist:  Shirlee More, MD  Electrophysiologist:  None   Evaluation Performed:  Follow-Up Visit  Chief Complaint:  53 yo female notable cardiac history of HF EF 45-50%, hypertensive heart disease presents for 6 week follow up of heart failure.   History of Present Illness:    Margaret Hess is a 53 y.o. female with PMH heart failure EF 45-50%, DM2, CVA, hypertensive heart disease last seen 12/29/18. At that visit we had discussion regarding home management of chronic conditions by daily weights, sodium/fluid restriction, and checking blood sugar. Asked her to discuss potential discontinuation of Actos with her PCP. She was scheduled for echocardiogram 01/20/19, but was not performed.  History is notable for initially severe dilated cardiomyopathy EF<20% with LV thrombus and stroke. Repeat EF improved July 2015 EF 50-55% with no thrombus. Most recent echo 2017 EF 45-50%. A repeat echocardiogram has been ordered, but not completed.   She reports feeling well. Has not yet seen Dr. Tobie Poet to discuss discontinuation of her Actos. Reports her  bilateral ankles do not look swollen. Reports her breathlessness has resolved. Has been walking around the block for exercise daily. Reports she is "rarely" short of breath.   The patient does not have symptoms concerning for COVID-19 infection (fever, chills, cough, or new shortness of breath).   Clinically she is doing well her edema has resolved she is walking outdoors she has no exertional shortness of breath orthopnea chest pain palpitation or syncope.  Unfortunately she is still taking Actos and I directed her primary care office to transition to another cardiac protective diabetic medication did not have her echocardiogram and will schedule lab work including proBNP hemoglobin A1c and reschedule her echocardiogram if EF is reduced I will transition her to [facility] from her ACE inhibitor.  I also discussed with her sodium restriction compliance with meds and encouraged her to weigh daily and record her weight which she is not doing Past Medical History:  Diagnosis Date  . CHF (congestive heart failure) ([place])   . Diabetes mellitus   . Hypertension   . Migraines   . Stroke [facility])    Past Surgical History:  Procedure Laterality Date  . LEFT AND RIGHT HEART CATHETERIZATION WITH CORONARY ANGIOGRAM N/A 06/16/2012   Procedure: LEFT AND RIGHT HEART CATHETERIZATION WITH CORONARY ANGIOGRAM;  Surgeon: Jolaine Artist, MD;  Location: [facility] C[facility];  Service: Cardiovascular;  Laterality: N/A;  . WISDOM TOOTH EXTRACTION       Current Meds  Medication Sig  . acetaminophen (TYLENOL) 500 MG tablet Take 1,000 mg by mouth every 6 (six) hours as needed for pain.   Marland Kitchen ALPRAZolam (  XANAX) 0.5 MG tablet Take 0.5 mg by mouth at bedtime.  . ARIPiprazole (ABILIFY) 5 MG tablet Take 5 mg by mouth daily as needed.   Marland Kitchen aspirin EC 81 MG tablet Take 1 tablet (81 mg total) by mouth daily.  . carvedilol (COREG) 3.125 MG tablet Take 1 tablet (3.125 mg total) by mouth 2 (two) times daily with a meal. Please call for OV  [phone]  . citalopram (CELEXA) 20 MG tablet Take 40 mg by mouth every morning.   . DULoxetine (CYMBALTA) 60 MG capsule Take 60 mg by mouth daily.  . fosfomycin (MONUROL) 3 g PACK Take 3 g by mouth every 3 (three) days.  . furosemide (LASIX) 20 MG tablet Take 1 tablet (20 mg total) by mouth daily.  Marland Kitchen gabapentin (NEURONTIN) 800 MG tablet Take 800-1,600 mg by mouth 3 (three) times daily. Takes 1 tablet in morning, 1 tablet at lunch, and 2 tablets at bedtime  . HYDROcodone-acetaminophen (NORCO/VICODIN) 5-325 MG per tablet Take 1 tablet by mouth 2 (two) times daily.   . Linaclotide (LINZESS) 145 MCG CAPS capsule Take 1 capsule by mouth daily. Reported on 03/06/2016  . lisinopril (PRINIVIL,ZESTRIL) 5 MG tablet Take 1 tablet (5 mg total) by mouth daily. MUST HAVE FOLLOW UP APPOINTMENT FOR FURTHER REFILLS  . metFORMIN (GLUCOPHAGE-XR) 500 MG 24 hr tablet Take 500 mg by mouth 2 (two) times a day.   Marland Kitchen MYRBETRIQ 50 MG TB24 tablet Take 1 tablet by mouth daily.  . ONE TOUCH ULTRA TEST test strip   . phenazopyridine (PYRIDIUM) 200 MG tablet Take 1 tablet by mouth 3 (three) times daily as needed.  . pioglitazone (ACTOS) 45 MG tablet Take 1 tablet by mouth daily.  Marland Kitchen zolpidem (AMBIEN) 5 MG tablet Take 5 mg by mouth at bedtime as needed.      Allergies:   Penicillins   Social History   Tobacco Use  . Smoking status: Never Smoker  . Smokeless tobacco: Never Used  Substance Use Topics  . Alcohol use: Yes    Comment: 1 mixed drink every 6 months  . Drug use: No     Family Hx: The patient's family history includes Heart disease in her father; Hypertension in her father; Multiple myeloma in her mother; Stroke in her maternal grandmother.  ROS:   Please see the history of present illness.     All other systems reviewed and are negative.   Prior CV studies:   The following studies were reviewed today:  03/2016 Echocardiogram: Study Conclusions   - Left ventricle: The cavity size was normal.  Systolic function was   mildly reduced. The estimated ejection fraction was in the range   of 45% to 50%. Mild diffuse hypokinesis with no identifiable   regional variations. Left ventricular diastolic function   parameters were normal. No evidence of thrombus.   Labs/Other Tests and Data Reviewed:    EKG:  No ECG reviewed.  Recent Labs: 11/16/2018: B Natriuretic Peptide 723.6; BUN 22; Creatinine, Ser 0.79; Hemoglobin 12.1; Platelets 305; Potassium 4.3; Sodium 135   Recent Lipid Panel Lab Results  Component Value Date/Time   CHOL 121 06/17/2012 04:55 AM   TRIG 169 (H) 06/17/2012 04:55 AM   HDL 23 (L) 06/17/2012 04:55 AM   CHOLHDL 5.3 06/17/2012 04:55 AM   LDLCALC 64 06/17/2012 04:55 AM    Wt Readings from Last 3 Encounters:  11/16/18 187 lb 2.7 oz (84.9 kg)  05/03/16 187 lb 3.2 oz (84.9 kg)  03/06/16 185 lb  8 oz (84.1 kg)     Objective:    Vital Signs:  There were no vitals taken for this visit.   VITAL SIGNS:  reviewed GEN:  no acute distress EYES:  sclerae anicteric, EOMI - Extraocular Movements Intact RESPIRATORY:  normal respiratory effort, symmetric expansion NEURO:  alert and oriented x 3, no obvious focal deficit PSYCH:  normal affect  ASSESSMENT & PLAN:    1. Chronic combined systolic and diastolic heart failure - Reports feeling well. NYHA class I. Reports no edema in her lower extremities. Walking around the block for exercise.   Labs this week: BMP, ProBNP  Echocardiogram 2. Hypertensive heart disease -continue current guideline directed therapy await ejection fraction and if diminished less than 40 clearly will place her on Entresto and if is less than 50 I would do the best we can to get pharmacy approval as these individuals act more like systolic heart failure than diastolic and I would still favor Entresto therapy 3. DM2 - Managed by PCP. Strongly encouraged she discuss discontinuing Actos with her PCP as it has a negative impact on HF. 4. History of  CVA -await echocardiogram regarding need for anticoagulation so  COVID-19 Education: The signs and symptoms of COVID-19 were discussed with the patient and how to seek care for testing (follow up with PCP or arrange E-visit).  The importance of social distancing was discussed today.  Time:   Today, I have spent 20 minutes with the patient with telehealth technology discussing the above problems.     Medication Adjustments/Labs and Tests Ordered: Current medicines are reviewed at length with the patient today.  Concerns regarding medicines are outlined above.   Tests Ordered: No orders of the defined types were placed in this encounter.   Medication Changes: No orders of the defined types were placed in this encounter.   Disposition:  Follow up in 3 month(s)  Signed, Shirlee More, MD  02/09/2019 8:30 AM    [facility]

## 2019-02-09 ENCOUNTER — Other Ambulatory Visit: Payer: Self-pay

## 2019-02-09 ENCOUNTER — Telehealth (INDEPENDENT_AMBULATORY_CARE_PROVIDER_SITE_OTHER): Payer: Medicare Other | Admitting: Cardiology

## 2019-02-09 ENCOUNTER — Encounter: Payer: Self-pay | Admitting: Cardiology

## 2019-02-09 DIAGNOSIS — I5042 Chronic combined systolic (congestive) and diastolic (congestive) heart failure: Secondary | ICD-10-CM

## 2019-02-09 DIAGNOSIS — I110 Hypertensive heart disease with heart failure: Secondary | ICD-10-CM

## 2019-02-09 DIAGNOSIS — Z131 Encounter for screening for diabetes mellitus: Secondary | ICD-10-CM

## 2019-02-09 NOTE — Patient Instructions (Addendum)
Medication Instructions:  Your physician recommends that you continue on your current medications as directed. Please refer to the Current Medication list given to you today.  If you need a refill on your cardiac medications before your next appointment, please call your pharmacy.   Lab work: Your physician recommends that you return for lab work in: This week A1C,CMP,Pro Bnp  If you have labs (blood work) drawn today and your tests are completely normal, you will receive your results only by: Marland Kitchen MyChart Message (if you have MyChart) OR . A paper copy in the mail If you have any lab test that is abnormal or we need to change your treatment, we will call you to review the results.  Testing/Procedures:Your physician has requested that you have an echocardiogram. Echocardiography is a painless test that uses sound waves to create images of your heart. It provides your doctor with information about the size and shape of your heart and how well your heart's chambers and valves are working. This procedure takes approximately one hour. There are no restrictions for this procedure.  YOUR APPOINTMENT IS   Follow-Up: At [facility], you and your health needs are our priority.  As part of our continuing mission to provide you with exceptional heart care, we have created designated Provider Care Teams.  These Care Teams include your primary Cardiologist (physician) and Advanced Practice Providers (APPs -  Physician Assistants and Nurse Practitioners) who all work together to provide you with the care you need, when you need it. You will need a follow up appointment in 3 months. Any Other Special Instructions Will Be Listed Below (If Applicable).  Heart Failure  Weigh yourself every morning when you first wake up and record on a calender or note pad, bring this to your office visits. Using a pill tender can help with taking your medications consistently.  Limit your fluid intake to 2 liters daily   Limit your sodium intake to less than 2-3 grams daily. Ask if you need dietary teaching.  If you gain more than 3 pounds (from your dry weight ), double your dose of diuretic for the day.  If you gain more than 5 pounds (from your dry weight), double your dose of lasix and call your heart failure doctor.  Please do not smoke tobacco since it is very bad for your heart.  Please do not drink alcohol since it can worsen your heart failure.Also avoid OTC nonsteroidal drugs, such as advil, aleve and motrin.  Try to exercise for at least 30 minutes every day because this will help your heart be more efficient. You may be eligible for supervised cardiac rehab, ask your physician.    Echocardiogram An echocardiogram is a procedure that uses painless sound waves (ultrasound) to produce an image of the heart. Images from an echocardiogram can provide important information about:  Signs of coronary artery disease (CAD).  Aneurysm detection. An aneurysm is a weak or damaged part of an artery wall that bulges out from the normal force of blood pumping through the body.  Heart size and shape. Changes in the size or shape of the heart can be associated with certain conditions, including heart failure, aneurysm, and CAD.  Heart muscle function.  Heart valve function.  Signs of a past heart attack.  Fluid buildup around the heart.  Thickening of the heart muscle.  A tumor or infectious growth around the heart valves. Tell a health care provider about:  Any allergies you have.  All  medicines you are taking, including vitamins, herbs, eye drops, creams, and over-the-counter medicines.  Any blood disorders you have.  Any surgeries you have had.  Any medical conditions you have.  Whether you are pregnant or may be pregnant. What are the risks? Generally, this is a safe procedure. However, problems may occur, including:  Allergic reaction to dye (contrast) that may be used during the  procedure. What happens before the procedure? No specific preparation is needed. You may eat and drink normally. What happens during the procedure?   An IV tube may be inserted into one of your veins.  You may receive contrast through this tube. A contrast is an injection that improves the quality of the pictures from your heart.  A gel will be applied to your chest.  A wand-like tool (transducer) will be moved over your chest. The gel will help to transmit the sound waves from the transducer.  The sound waves will harmlessly bounce off of your heart to allow the heart images to be captured in real-time motion. The images will be recorded on a computer. The procedure may vary among health care providers and hospitals. What happens after the procedure?  You may return to your normal, everyday life, including diet, activities, and medicines, unless your health care provider tells you not to do that. Summary  An echocardiogram is a procedure that uses painless sound waves (ultrasound) to produce an image of the heart.  Images from an echocardiogram can provide important information about the size and shape of your heart, heart muscle function, heart valve function, and fluid buildup around your heart.  You do not need to do anything to prepare before this procedure. You may eat and drink normally.  After the echocardiogram is completed, you may return to your normal, everyday life, unless your health care provider tells you not to do that. This information is not intended to replace advice given to you by your health care provider. Make sure you discuss any questions you have with your health care provider. Document Released: 08/16/2000 Document Revised: 09/21/2016 Document Reviewed: 09/21/2016 Elsevier Interactive Patient Education  2019 [employer].

## 2019-02-09 NOTE — Addendum Note (Signed)
Addended by: Particia Nearing B on: 02/09/2019 09:17 AM   Modules accepted: Orders

## 2019-02-17 ENCOUNTER — Other Ambulatory Visit: Payer: Self-pay

## 2019-02-17 ENCOUNTER — Ambulatory Visit (HOSPITAL_BASED_OUTPATIENT_CLINIC_OR_DEPARTMENT_OTHER)
Admission: RE | Admit: 2019-02-17 | Discharge: 2019-02-17 | Disposition: A | Discharge status: Home / Self Care | Payer: Medicare Other | Source: Ambulatory Visit | Attending: Cardiology | Admitting: Cardiology

## 2019-02-17 DIAGNOSIS — I5042 Chronic combined systolic (congestive) and diastolic (congestive) heart failure: Secondary | ICD-10-CM | POA: Insufficient documentation

## 2019-02-17 DIAGNOSIS — I110 Hypertensive heart disease with heart failure: Secondary | ICD-10-CM | POA: Diagnosis not present

## 2019-02-17 NOTE — Progress Notes (Signed)
  Echocardiogram 2D Echocardiogram has been performed.  Margaret Hess 02/17/2019, 3:34 PM

## 2019-02-18 ENCOUNTER — Telehealth: Payer: Self-pay | Admitting: *Deleted

## 2019-02-18 MED ORDER — ENTRESTO 24-26 MG PO TABS: 1.0000 | ORAL_TABLET | Freq: Two times a day (BID) | ORAL | 1 refills | Status: DC

## 2019-02-18 NOTE — Telephone Encounter (Signed)
-----   Message from Richardo Priest, MD sent at 02/18/2019  1:28 PM EDT ----- Normal or stable result  Unfortunately her ejection fraction is again severely reduced  I would like her to discontinue Actos and have her contact her primary care physician regarding alternative treatment for diabetes  Discontinue lisinopril for 3 full days 3 doses then initiate Entresto 24/26 mg twice daily

## 2019-02-18 NOTE — Telephone Encounter (Signed)
Telephone call to patient . Informed of echo results and new medication changes. Informed to stop lisinopril for 3 full days then start Entresto 24/26 twice daily. Also informed to stop Actos and get in touch with her PCP for further direction on her diabetes. Patent able to repeat instructions and had no further questions

## 2019-05-12 ENCOUNTER — Ambulatory Visit: Payer: Medicare Other | Admitting: Cardiology

## 2019-05-12 NOTE — Progress Notes (Deleted)
Cardiology Office Note:    Date:  05/12/2019   ID:  Margaret Hess, DOB 04-24-1966, MRN [id number]  PCP:  Rochel Brome, MD  Cardiologist:  Shirlee More, MD    Referring MD: Rochel Brome, MD    ASSESSMENT:    No diagnosis found. PLAN:    In order of problems listed above:  1. ***   Next appointment: ***   Medication Adjustments/Labs and Tests Ordered: Current medicines are reviewed at length with the patient today.  Concerns regarding medicines are outlined above.  No orders of the defined types were placed in this encounter.  No orders of the defined types were placed in this encounter.   No chief complaint on file.   History of Present Illness:    Margaret Hess is a 53 y.o. female with a hx of heart failure EF 45-50%, DM2, CVA, and hypertensive heart disease  last seen 02/09/2019 virtual visit.Marland Kitchen She was transitioned from ACE inhibitor to [place] after her echocardiogram in June. Compliance with diet, lifestyle and medications: ***  Echo 02/17/2019:  1. The left ventricle has moderate-severely reduced systolic function, with an ejection fraction of 30-35%. The cavity size was mildly dilated. Left ventricular diastolic Doppler parameters are consistent with impaired relaxation. Left ventrical global  hypokinesis without regional wall motion abnormalities.  2. No evidence of mitral valve stenosis.  3. The aortic valve is tricuspid. No stenosis of the aortic valve.  4. The aortic root and ascending aorta are normal in size and structure. Past Medical History:  Diagnosis Date  . CHF (congestive heart failure) ([place])   . Diabetes mellitus   . Hypertension   . Migraines   . Stroke [facility])     Past Surgical History:  Procedure Laterality Date  . LEFT AND RIGHT HEART CATHETERIZATION WITH CORONARY ANGIOGRAM N/A 06/16/2012   Procedure: LEFT AND RIGHT HEART CATHETERIZATION WITH CORONARY ANGIOGRAM;  Surgeon: Jolaine Artist, MD;  Location: [facility] CATH LAB;  Service:  Cardiovascular;  Laterality: N/A;  . WISDOM TOOTH EXTRACTION      Current Medications: No outpatient medications have been marked as taking for the 05/12/19 encounter (Appointment) with Richardo Priest, MD.     Allergies:   Penicillins   Social History   Socioeconomic History  . Marital status: Divorced    Spouse name: Not on file  . Number of children: Not on file  . Years of education: Not on file  . Highest education level: Not on file  Occupational History  . Not on file  Social Needs  . Financial resource strain: Not on file  . Food insecurity    Worry: Not on file    Inability: Not on file  . Transportation needs    Medical: Not on file    Non-medical: Not on file  Tobacco Use  . Smoking status: Never Smoker  . Smokeless tobacco: Never Used  Substance and Sexual Activity  . Alcohol use: Yes    Comment: 1 mixed drink every 6 months  . Drug use: No  . Sexual activity: Yes    Birth control/protection: Inserts  Lifestyle  . Physical activity    Days per week: Not on file    Minutes per session: Not on file  . Stress: Not on file  Relationships  . Social Herbalist on phone: Not on file    Gets together: Not on file    Attends religious service: Not on file    Active member of  club or organization: Not on file    Attends meetings of clubs or organizations: Not on file    Relationship status: Not on file  Other Topics Concern  . Not on file  Social History Narrative  . Not on file     Family History: The patient's ***family history includes Heart disease in her father; Hypertension in her father; Multiple myeloma in her mother; Stroke in her maternal grandmother. ROS:   Please see the history of present illness.    All other systems reviewed and are negative.  EKGs/Labs/Other Studies Reviewed:    The following studies were reviewed today:  EKG:  EKG ordered today and personally reviewed.  The ekg ordered today demonstrates ***  Recent Labs:  11/16/2018: B Natriuretic Peptide 723.6; BUN 22; Creatinine, Ser 0.79; Hemoglobin 12.1; Platelets 305; Potassium 4.3; Sodium 135  Recent Lipid Panel    Component Value Date/Time   CHOL 121 06/17/2012 0455   TRIG 169 (H) 06/17/2012 0455   HDL 23 (L) 06/17/2012 0455   CHOLHDL 5.3 06/17/2012 0455   VLDL 34 06/17/2012 0455   LDLCALC 64 06/17/2012 0455    Physical Exam:    VS:  There were no vitals taken for this visit.    Wt Readings from Last 3 Encounters:  11/16/18 187 lb 2.7 oz (84.9 kg)  05/03/16 187 lb 3.2 oz (84.9 kg)  03/06/16 185 lb 8 oz (84.1 kg)     GEN: *** Well nourished, well developed in no acute distress HEENT: Normal NECK: No JVD; No carotid bruits LYMPHATICS: No lymphadenopathy CARDIAC: ***RRR, no murmurs, rubs, gallops RESPIRATORY:  Clear to auscultation without rales, wheezing or rhonchi  ABDOMEN: Soft, non-tender, non-distended MUSCULOSKELETAL:  No edema; No deformity  SKIN: Warm and dry NEUROLOGIC:  Alert and oriented x 3 PSYCHIATRIC:  Normal affect    Signed, Shirlee More, MD  05/12/2019 7:52 AM    [place]

## 2019-05-25 ENCOUNTER — Telehealth: Payer: Self-pay | Admitting: Cardiology

## 2019-05-25 MED ORDER — ENTRESTO 24-26 MG PO TABS: 1.0000 | ORAL_TABLET | Freq: Two times a day (BID) | ORAL | 0 refills | Status: DC

## 2019-05-25 NOTE — Telephone Encounter (Signed)
30 day supply of entresto sent to [facility] in [facility] with no refills as patient is due for a follow up appointment. Patient will receive further refills when she calls to schedule an appointment.

## 2019-05-25 NOTE — Telephone Encounter (Signed)
°*  STAT* If patient is at the pharmacy, call can be transferred to refill team.   1. Which medications need to be refilled? (please list name of each medication and dose if known) Entresto 24-26mg  tablets  2. Which pharmacy/location (including street and city if local pharmacy) is medication to be sent to?[facility] [address] [place]  3. Do they need a 30 day or 90 day supply? [place]

## 2019-06-08 ENCOUNTER — Other Ambulatory Visit: Payer: Self-pay

## 2019-06-08 MED ORDER — FUROSEMIDE 20 MG PO TABS: 20.0000 mg | ORAL_TABLET | Freq: Every day | ORAL | 0 refills | Status: DC

## 2019-07-03 ENCOUNTER — Other Ambulatory Visit: Payer: Self-pay | Admitting: Cardiology

## 2019-07-23 ENCOUNTER — Other Ambulatory Visit: Payer: Self-pay

## 2019-07-23 MED ORDER — ENTRESTO 24-26 MG PO TABS: 1.0000 | ORAL_TABLET | Freq: Two times a day (BID) | ORAL | 0 refills | Status: DC

## 2019-08-24 ENCOUNTER — Other Ambulatory Visit: Payer: Self-pay | Admitting: Cardiology

## 2019-09-21 ENCOUNTER — Other Ambulatory Visit: Payer: Self-pay | Admitting: Cardiology

## 2019-10-06 NOTE — Progress Notes (Signed)
Cardiology Office Note:    Date:  10/07/2019   ID:  Margaret, Hess Jan 20, 1966, MRN [id number]  PCP:  Margaret Brome, MD  Cardiologist:  Margaret More, MD    Referring MD: Margaret Brome, MD    ASSESSMENT:    1. Hypertensive heart disease with chronic combined systolic and diastolic congestive heart failure (Margaret Hess)   2. Chronic combined systolic (congestive) and diastolic (congestive) heart failure (HCC)   3. Cerebrovascular accident (CVA) due to embolism of precerebral artery (Margaret Hess)    PLAN:    In order of problems listed above:  1. Improved she has no edema heart failure is compensated New York Heart Association class I await echocardiogram if ejection fraction is less than 40 require intensification of medical therapy and reconsideration of ICD.  We also need to be sure that she does not have an LV thrombus as she is no longer anticoagulated she will continue her loop diuretic Entresto and beta-blocker.  I encouraged her to check her home blood pressures record and weigh daily   Next appointment: Months   Medication Adjustments/Labs and Tests Ordered: Current medicines are reviewed at length with the patient today.  Concerns regarding medicines are outlined above.  Orders Placed This Encounter  Procedures  . EKG 12-Lead  . ECHOCARDIOGRAM COMPLETE   No orders of the defined types were placed in this encounter.   Chief Complaint  Patient presents with  . Follow-up  . Congestive Heart Failure    History of Present Illness:    Margaret Hess is a 54 y.o. female with a hx of heart failure EF 45-50%, DM2, CVA, hypertensive heart disease previously seen 12/29/18. At that visit we had discussion regarding home management of chronic conditions by daily weights, sodium/fluid restriction, and checking blood sugar. Asked her to discuss potential discontinuation of Actos with her PCP. She was scheduled for echocardiogram 01/20/19, but was not performed. Her history is notable for  initially severe dilated cardiomyopathy EF<20% with LV thrombus and stroke. Repeat EF improved July 2015 EF 50-55% with no thrombus.  Her most recent echo 2017 EF 45-50%. A repeat echocardiogram has been ordered, but not completed.  She was last seen 02/09/2019.  At that time she has New York Heart Association class I and had no edema.   Compliance with diet, lifestyle and medications: Yes  Recent labs from care everywhere CMP potassium 4.4 creatinine 0.81 very mild nonspecific elevation of ALT and AST GFR 83 cc LDL cholesterol 85  In general she is doing better she has no edema takes her diuretic does not weigh at home and is not checking her blood pressure.  We reviewed the necessity of an echocardiogram and she agreed to have a performed we will try to schedule as quickly as possible to see if we need to uptitrate Entresto.  Her heart failure is compensated New York Heart Association class I and she has no edema.  My office she became sick to her stomach and vomited and attributes it to her diabetic medication. Past Medical History:  Diagnosis Date  . CHF (congestive heart failure) ([place])   . Diabetes mellitus   . Hypertension   . Migraines   . Stroke [facility])     Past Surgical History:  Procedure Laterality Date  . LEFT AND RIGHT HEART CATHETERIZATION WITH CORONARY ANGIOGRAM N/A 06/16/2012   Procedure: LEFT AND RIGHT HEART CATHETERIZATION WITH CORONARY ANGIOGRAM;  Surgeon: Jolaine Artist, MD;  Location[facility] CATH LAB;  Service: Cardiovascular;  Laterality: N/A;  . WISDOM TOOTH EXTRACTION      Current Medications: Current Meds  Medication Sig  . acetaminophen (TYLENOL) 500 MG tablet Take 1,000 mg by mouth every 6 (six) hours as needed for pain.   Marland Kitchen ALPRAZolam (XANAX) 0.5 MG tablet Take 0.5 mg by mouth at bedtime.  . ARIPiprazole (ABILIFY) 5 MG tablet Take 5 mg by mouth daily as needed.   Marland Kitchen aspirin EC 81 MG tablet Take 1 tablet (81 mg total) by mouth daily.  . carvedilol (COREG) 3.125  MG tablet Take 1 tablet (3.125 mg total) by mouth 2 (two) times daily with a meal. Please call for OV [phone]  . citalopram (CELEXA) 20 MG tablet Take 40 mg by mouth every morning.   . clobetasol cream (TEMOVATE) 0.05 % APPLY SMALL AMOUNT AA BID  . DULoxetine (CYMBALTA) 60 MG capsule Take 60 mg by mouth daily.  . fosfomycin (MONUROL) 3 g PACK Take 3 g by mouth every 3 (three) days.  . furosemide (LASIX) 20 MG tablet Take 1 tablet (20 mg total) by mouth daily.  Marland Kitchen gabapentin (NEURONTIN) 800 MG tablet Take 800-1,600 mg by mouth 3 (three) times daily. Takes 1 tablet in morning, 1 tablet at lunch, and 2 tablets at bedtime  . glipiZIDE (GLUCOTROL XL) 5 MG 24 hr tablet TAKE 1 TABLET(5 MG) BY MOUTH DAILY  . HYDROcodone-acetaminophen (NORCO/VICODIN) 5-325 MG per tablet Take 1 tablet by mouth 2 (two) times daily.   . Linaclotide (LINZESS) 145 MCG CAPS capsule Take 1 capsule by mouth daily. Reported on 03/06/2016  . metFORMIN (GLUCOPHAGE-XR) 500 MG 24 hr tablet Take 500 mg by mouth 2 (two) times a day.   Marland Kitchen MYRBETRIQ 50 MG TB24 tablet Take 1 tablet by mouth daily.  . ONE TOUCH ULTRA TEST test strip   . oxybutynin (DITROPAN) 5 MG tablet   . phenazopyridine (PYRIDIUM) 200 MG tablet Take 1 tablet by mouth 3 (three) times daily as needed.  . sacubitril-valsartan (ENTRESTO) 24-26 MG Take by mouth.  . zolpidem (AMBIEN) 5 MG tablet Take 5 mg by mouth at bedtime as needed.      Allergies:   Penicillins   Social History   Socioeconomic History  . Marital status: Divorced    Spouse name: Not on file  . Number of children: Not on file  . Years of education: Not on file  . Highest education level: Not on file  Occupational History  . Not on file  Tobacco Use  . Smoking status: Never Smoker  . Smokeless tobacco: Never Used  Substance and Sexual Activity  . Alcohol use: Yes    Comment: 1 mixed drink every 6 months  . Drug use: No  . Sexual activity: Yes    Birth control/protection: Inserts  Other  Topics Concern  . Not on file  Social History Narrative  . Not on file   Social Determinants of Health   Financial Resource Strain:   . Difficulty of Paying Living Expenses: Not on file  Food Insecurity:   . Worried About Charity fundraiser in the Last Year: Not on file  . Ran Out of Food in the Last Year: Not on file  Transportation Needs:   . Lack of Transportation (Medical): Not on file  . Lack of Transportation (Non-Medical): Not on file  Physical Activity:   . Days of Exercise per Week: Not on file  . Minutes of Exercise per Session: Not on file  Stress:   . Feeling of  Stress : Not on file  Social Connections:   . Frequency of Communication with Friends and Family: Not on file  . Frequency of Social Gatherings with Friends and Family: Not on file  . Attends Religious Services: Not on file  . Active Member of Clubs or Organizations: Not on file  . Attends Archivist Meetings: Not on file  . Marital Status: Not on file     Family History: The patient's family history includes Heart disease in her father; Hypertension in her father; Multiple myeloma in her mother; Stroke in her maternal grandmother. ROS:   Please see the history of present illness.    All other systems reviewed and are negative.  EKGs/Labs/Other Studies Reviewed:    The following studies were reviewed today:  EKG:  EKG ordered today and personally reviewed.  The ekg ordered today demonstrates sinus rhythm and is normal  Recent Labs: 11/16/2018: B Natriuretic Peptide 723.6; BUN 22; Creatinine, Ser 0.79; Hemoglobin 12.1; Platelets 305; Potassium 4.3; Sodium 135  Recent Lipid Panel    Component Value Date/Time   CHOL 121 06/17/2012 0455   TRIG 169 (H) 06/17/2012 0455   HDL 23 (L) 06/17/2012 0455   CHOLHDL 5.3 06/17/2012 0455   VLDL 34 06/17/2012 0455   LDLCALC 64 06/17/2012 0455    Physical Exam:    VS:  BP (!) 144/92   Pulse 90   Ht '5\' 5"'  (1.651 m)   Wt 200 lb 1.9 oz (90.8 kg)    SpO2 98%   BMI 33.30 kg/m     Wt Readings from Last 3 Encounters:  10/07/19 200 lb 1.9 oz (90.8 kg)  11/16/18 187 lb 2.7 oz (84.9 kg)  05/03/16 187 lb 3.2 oz (84.9 kg)     GEN: He has difficulty ambulating with left-sided hemiparesis well nourished, well developed in no acute distress HEENT: Normal NECK: No JVD; No carotid bruits LYMPHATICS: No lymphadenopathy CARDIAC: RRR, no murmurs, rubs, gallops RESPIRATORY:  Clear to auscultation without rales, wheezing or rhonchi  ABDOMEN: Soft, non-tender, non-distended MUSCULOSKELETAL:  No edema; No deformity  SKIN: Warm and dry NEUROLOGIC:  Alert and oriented x 3 PSYCHIATRIC:  Normal affect    Signed, Margaret More, MD  10/07/2019 10:12 AM    [place]

## 2019-10-07 ENCOUNTER — Ambulatory Visit (INDEPENDENT_AMBULATORY_CARE_PROVIDER_SITE_OTHER): Payer: Medicare PPO | Admitting: Cardiology

## 2019-10-07 ENCOUNTER — Encounter: Payer: Self-pay | Admitting: Cardiology

## 2019-10-07 ENCOUNTER — Other Ambulatory Visit: Payer: Self-pay

## 2019-10-07 VITALS — BP 144/92 | HR 90 | Ht 65.0 in | Wt 200.1 lb

## 2019-10-07 DIAGNOSIS — I6310 Cerebral infarction due to embolism of unspecified precerebral artery: Secondary | ICD-10-CM

## 2019-10-07 DIAGNOSIS — I110 Hypertensive heart disease with heart failure: Secondary | ICD-10-CM | POA: Diagnosis not present

## 2019-10-07 DIAGNOSIS — I5042 Chronic combined systolic (congestive) and diastolic (congestive) heart failure: Secondary | ICD-10-CM | POA: Diagnosis not present

## 2019-10-07 NOTE — Patient Instructions (Signed)
Medication Instructions:  Your physician recommends that you continue on your current medications as directed. Please refer to the Current Medication list given to you today.  *If you need a refill on your cardiac medications before your next appointment, please call your pharmacy*  Lab Work: NONE If you have labs (blood work) drawn today and your tests are completely normal, you will receive your results only by: Marland Kitchen MyChart Message (if you have MyChart) OR . A paper copy in the mail If you have any lab test that is abnormal or we need to change your treatment, we will call you to review the results.  Testing/Procedures: You had an EKG Performed today  Your physician has requested that you have an echocardiogram. Echocardiography is a painless test that uses sound waves to create images of your heart. It provides your doctor with information about the size and shape of your heart and how well your heart's chambers and valves are working. This procedure takes approximately one hour. There are no restrictions for this procedure.    Follow-Up: At [facility], you and your health needs are our priority.  As part of our continuing mission to provide you with exceptional heart care, we have created designated Provider Care Teams.  These Care Teams include your primary Cardiologist (physician) and Advanced Practice Providers (APPs -  Physician Assistants and Nurse Practitioners) who all work together to provide you with the care you need, when you need it.  Your next appointment:   3 month(s)  The format for your next appointment:   In Person  Provider:   Norman Herrlich, MD  Other Instructions  Echocardiogram An echocardiogram is a procedure that uses painless sound waves (ultrasound) to produce an image of the heart. Images from an echocardiogram can provide important information about:  Signs of coronary artery disease (CAD).  Aneurysm detection. An aneurysm is a weak or damaged part  of an artery wall that bulges out from the normal force of blood pumping through the body.  Heart size and shape. Changes in the size or shape of the heart can be associated with certain conditions, including heart failure, aneurysm, and CAD.  Heart muscle function.  Heart valve function.  Signs of a past heart attack.  Fluid buildup around the heart.  Thickening of the heart muscle.  A tumor or infectious growth around the heart valves. Tell a health care provider about:  Any allergies you have.  All medicines you are taking, including vitamins, herbs, eye drops, creams, and over-the-counter medicines.  Any blood disorders you have.  Any surgeries you have had.  Any medical conditions you have.  Whether you are pregnant or may be pregnant. What are the risks? Generally, this is a safe procedure. However, problems may occur, including:  Allergic reaction to dye (contrast) that may be used during the procedure. What happens before the procedure? No specific preparation is needed. You may eat and drink normally. What happens during the procedure?   An IV tube may be inserted into one of your veins.  You may receive contrast through this tube. A contrast is an injection that improves the quality of the pictures from your heart.  A gel will be applied to your chest.  A wand-like tool (transducer) will be moved over your chest. The gel will help to transmit the sound waves from the transducer.  The sound waves will harmlessly bounce off of your heart to allow the heart images to be captured in real-time motion.  The images will be recorded on a computer. The procedure may vary among health care providers and hospitals. What happens after the procedure?  You may return to your normal, everyday life, including diet, activities, and medicines, unless your health care provider tells you not to do that. Summary  An echocardiogram is a procedure that uses painless sound waves  (ultrasound) to produce an image of the heart.  Images from an echocardiogram can provide important information about the size and shape of your heart, heart muscle function, heart valve function, and fluid buildup around your heart.  You do not need to do anything to prepare before this procedure. You may eat and drink normally.  After the echocardiogram is completed, you may return to your normal, everyday life, unless your health care provider tells you not to do that. This information is not intended to replace advice given to you by your health care provider. Make sure you discuss any questions you have with your health care provider. Document Revised: 12/10/2018 Document Reviewed: 09/21/2016 Elsevier Patient Education  2020 [employer].

## 2019-10-13 ENCOUNTER — Other Ambulatory Visit: Payer: Self-pay

## 2019-10-13 ENCOUNTER — Ambulatory Visit (HOSPITAL_BASED_OUTPATIENT_CLINIC_OR_DEPARTMENT_OTHER)
Admission: RE | Admit: 2019-10-13 | Discharge: 2019-10-13 | Disposition: A | Discharge status: Home / Self Care | Payer: Medicare PPO | Source: Ambulatory Visit | Attending: Cardiology | Admitting: Cardiology

## 2019-10-13 DIAGNOSIS — I5042 Chronic combined systolic (congestive) and diastolic (congestive) heart failure: Secondary | ICD-10-CM

## 2019-10-13 DIAGNOSIS — I110 Hypertensive heart disease with heart failure: Secondary | ICD-10-CM

## 2019-10-13 NOTE — Progress Notes (Signed)
  Echocardiogram 2D Echocardiogram has been performed.  Margaret Hess 10/13/2019, 3:22 PM

## 2019-10-14 ENCOUNTER — Telehealth: Payer: Self-pay | Admitting: Emergency Medicine

## 2019-10-14 ENCOUNTER — Telehealth: Payer: Self-pay | Admitting: Cardiology

## 2019-10-14 NOTE — Telephone Encounter (Signed)
New Message   Pt is returning phone call regarding her results  Please call

## 2019-10-14 NOTE — Telephone Encounter (Signed)
Left message for patient to return call regarding echocardiogram results  

## 2019-10-14 NOTE — Telephone Encounter (Signed)
Margaret Daub, MD  10/14/2019 7:47 AM EST    Normal or stable result  EF moderately reduced 40% we do not need to resume an anticoagulant no change in her current cardiac meds     Patient aware and verbalized understanding.

## 2019-10-24 ENCOUNTER — Other Ambulatory Visit: Payer: Self-pay | Admitting: Cardiology

## 2020-01-04 ENCOUNTER — Other Ambulatory Visit: Payer: Self-pay

## 2020-01-04 NOTE — Progress Notes (Deleted)
Cardiology Office Note:    Date:  01/04/2020   ID:  Margaret Hess, DOB Jan 20, 1966, MRN [id number]  PCP:  Rochel Brome, MD  Cardiologist:  Shirlee More, MD    Referring MD: Rochel Brome, MD    ASSESSMENT:    No diagnosis found. PLAN:    In order of problems listed above:  1. ***   Next appointment: ***   Medication Adjustments/Labs and Tests Ordered: Current medicines are reviewed at length with the patient today.  Concerns regarding medicines are outlined above.  No orders of the defined types were placed in this encounter.  No orders of the defined types were placed in this encounter.   No chief complaint on file.   History of Present Illness:    Margaret Hess is a 54 y.o. 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 Repeat EF improved July 2015 EF 50-55% with no thrombus evident.  Recent echocardiogram 020 06/2020 showed ejection fraction of 40% with diffuse hypokinesia right ventricular size and function.  Low-dose Delene Loll has missed several office visits for up titration  Compliance with diet, lifestyle and medications: *** Past Medical History:  Diagnosis Date  . Acute respiratory failure with hypoxia ([place]) 06/16/2012  . Anxiety 06/15/2012  . Bipolar disorder, unspecified ([place]) 08/29/2014  . Cerebral embolism with cerebral infarction ([place]) 06/20/2012   Formatting of this note might be different from the original. Last Assessment & Plan:  No signs of bleeding, continue coumadin.  Marland Kitchen Cerebral infarction [facility]) 06/26/2012   Formatting of this note might be different from the original. Last Assessment & Plan:  Continue speech therapy and will restart PT once speech finished.  . CHF (congestive heart failure) ([place])   . Chronic combined systolic (congestive) and diastolic (congestive) heart failure ([place])  06/16/2012   She was seen in the emergency room after [facility] 11/16/2018 with decompensated heart failure.  She was last seen in the heart failure program 03/06/2016 and at that time her ejection fraction was 45 to 50%.  It was not reassessed however her BNP level was significantly elevated from baseline at 724.  Formatting of this note might be different from the original. Last Assessment & Plan:   . Chronic systolic heart failure ([place]) 06/15/2012   Formatting of this note might be different from the original. Last Assessment & Plan:  NYHA II. Volume status stable. Continue lasix as needed. Stop digoxin. Cut lisinopril back to 5 mg at bed time. Follow up in 3 months with an ECHO.   Patient seen and examined with Darrick Grinder, NP. We discussed all aspects of the encounter. I agree with the assessment and plan as stated above. She is much improved  . Diabetes mellitus   . Diabetes mellitus ([place]) 06/16/2012  . Dizziness 02/02/2014  . Fatigue 02/02/2014  . History of cerebral embolism 06/20/2012   Formatting of this note might be different from the original. Overview:  Last Assessment & Plan:  No signs of bleeding, continue coumadin.  Marland Kitchen HTN (hypertension) 06/15/2012   Formatting of this note might be different from the original. Last Assessment & Plan:  Controlled but as above will increase lisinopril 5 mg for afterload reduction.  Last Assessment & Plan:  Controlled but as above will increase lisinopril 5 mg for afterload reduction.  . Hypertension   . Hypertensive heart disease 06/15/2012  Formatting of this note might be different from the original. Last Assessment & Plan:  Controlled but as above will increase lisinopril 5 mg for afterload reduction.  . Long term (current) use of anticoagulants 11/11/2012  . Lower urinary tract infectious disease 02/02/2014   Formatting of this note might be different from the original. IMO Problem List Replacer Jan. 2016  . Migraines   . Mural thrombus of  heart 06/16/2012   Formatting of this note might be different from the original. Last Assessment & Plan:  Will continue coumadin until EF has recovered.  The patient has expressed interest in NOAC agents but there is no indication for NOAC agents in patients with LV thrombus.  Have discussed with the patient and she voices understanding.  Repeat echo in 2 months.  . Nonischemic cardiomyopathy ([place]) 06/17/2012   Formatting of this note might be different from the original. Last Assessment & Plan:  EF 35-40% but patient exhibits no signs of HF.  Will continue to titrate HF meds with lisinopril 5 mg BID.  Will follow up in 1 month in hopes to titrate carvedilol.  Will repeat echo in 2 months.  Continue sliding scale lasix.    Last Assessment & Plan:  EF 35-40% but patient exhibits no signs of HF.  Will cont  . Palpitations 08/20/2012   Formatting of this note might be different from the original. Last Assessment & Plan:  Daily palpitations. Place Holter monitor for 48 hours.  Attending: I am concerned she may be having NSVT. However she did not have much ectopy while in the hospital. Will place 48-hour holter. May need to consider ICD if EF not improving.  Marland Kitchen Positive D dimer 06/16/2012  . Pulmonary hypertension ([place]) 06/16/2012  . SOB (shortness of breath) 06/15/2012  . Stroke ([place])   . 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 Last Assessment & Plan:  No signs of bleeding, continue coumadin.  Last Assessment & Plan:  No signs of bleeding, continue coumadin.    Past Surgical History:  Procedure Laterality Date  . LEFT AND RIGHT HEART CATHETERIZATION WITH CORONARY ANGIOGRAM N/A 06/16/2012   Procedure: LEFT AND RIGHT HEART CATHETERIZATION WITH CORONARY ANGIOGRAM;  Surgeon: Jolaine Artist, MD;  [facility] LAB;  Service: Cardiovascular;  Laterality: N/A;  . WISDOM TOOTH EXTRACTION      Current Medications: No outpatient medications have  been marked as taking for the 01/05/20 encounter (Appointment) with Richardo Priest, MD.     Allergies:   Penicillins   Social History   Socioeconomic History  . Marital status: Divorced    Spouse name: Not on file  . Number of children: Not on file  . Years of education: Not on file  . Highest education level: Not on file  Occupational History  . Not on file  Tobacco Use  . Smoking status: Never Smoker  . Smokeless tobacco: Never Used  Substance and Sexual Activity  . Alcohol use: Yes    Comment: 1 mixed drink every 6 months  . Drug use: No  . Sexual activity: Yes    Birth control/protection: Inserts  Other Topics Concern  . Not on file  Social History Narrative  . Not on file   Social Determinants of Health   Financial Resource Strain:   . Difficulty of Paying Living Expenses:   Food Insecurity:   . Worried About Charity fundraiser in the Last Year:   .  Ran Out of Food in the Last Year:   Transportation Needs:   . Film/video editor (Medical):   Marland Kitchen Lack of Transportation (Non-Medical):   Physical Activity:   . Days of Exercise per Week:   . Minutes of Exercise per Session:   Stress:   . Feeling of Stress :   Social Connections:   . Frequency of Communication with Friends and Family:   . Frequency of Social Gatherings with Friends and Family:   . Attends Religious Services:   . Active Member of Clubs or Organizations:   . Attends Archivist Meetings:   Marland Kitchen Marital Status:      Family History: The patient's ***family history includes Heart disease in her father; Hypertension in her father; Multiple myeloma in her mother; Stroke in her maternal grandmother. ROS:   Please see the history of present illness.    All other systems reviewed and are negative.  EKGs/Labs/Other Studies Reviewed:    The following studies were reviewed today:  EKG:  EKG ordered today and personally reviewed.  The ekg ordered today demonstrates ***  Recent Labs: No  results found for requested labs within last 8760 hours.  Recent Lipid Panel    Component Value Date/Time   CHOL 121 06/17/2012 0455   TRIG 169 (H) 06/17/2012 0455   HDL 23 (L) 06/17/2012 0455   CHOLHDL 5.3 06/17/2012 0455   VLDL 34 06/17/2012 0455   LDLCALC 64 06/17/2012 0455    Physical Exam:    VS:  There were no vitals taken for this visit.    Wt Readings from Last 3 Encounters:  10/07/19 200 lb 1.9 oz (90.8 kg)  11/16/18 187 lb 2.7 oz (84.9 kg)  05/03/16 187 lb 3.2 oz (84.9 kg)     GEN: *** Well nourished, well developed in no acute distress HEENT: Normal NECK: No JVD; No carotid bruits LYMPHATICS: No lymphadenopathy CARDIAC: ***RRR, no murmurs, rubs, gallops RESPIRATORY:  Clear to auscultation without rales, wheezing or rhonchi  ABDOMEN: Soft, non-tender, non-distended MUSCULOSKELETAL:  No edema; No deformity  SKIN: Warm and dry NEUROLOGIC:  Alert and oriented x 3 PSYCHIATRIC:  Normal affect    Signed, Shirlee More, MD  01/04/2020 7:14 PM    [facility]

## 2020-01-05 ENCOUNTER — Ambulatory Visit: Payer: Medicare PPO | Admitting: Cardiology

## 2020-02-06 NOTE — Progress Notes (Signed)
Cardiology Office Note:    Date:  02/07/2020   ID:  Margaret Hess, Margaret Hess 11/10/1965, MRN [id number]  PCP:  Elisabeth Cara, PA-C  Cardiologist:  Shirlee More, MD    Referring MD: Rochel Brome, MD    ASSESSMENT:    1. Chronic combined systolic (congestive) and diastolic (congestive) heart failure ([place])   2. Hypertensive heart disease with chronic combined systolic and diastolic congestive heart failure ([place])   3. Type 2 diabetes mellitus without complication, without long-term current use of insulin (HCC)   4. Palpitations    PLAN:    In order of problems listed above:  Compensated with her symptoms of weakness and GI upset will check full labs including CBC and renal function Continue current treatment and I would defer up titration of Entresto with her new symptoms Stable managed by her PCP Check 7-day ZIO monitor to screen for arrhythmia, if atrial fibrillation is documented she need to be resumed on anticoagulation  Next appointment: 3 months   Medication Adjustments/Labs and Tests Ordered: Current medicines are reviewed at length with the patient today.  Concerns regarding medicines are outlined above.  Orders Placed This Encounter  Procedures  . CBC  . Basic metabolic panel  . Pro b natriuretic peptide  . LONG TERM MONITOR (3-14 DAYS)   No orders of the defined types were placed in this encounter.   Chief Complaint  Patient presents with  . Follow-up  . Congestive Heart Failure    History of Present Illness:    Margaret Hess is a 54 y.o. female with a hx of heart failure EF 45-50%, DM2, CVA, hypertensive heart disease previously seen 12/29/18.  Her history is notable for initially severe dilated cardiomyopathy EF<20% with LV thrombus and stroke. Repeat EF improved July 2015 EF 50-55% with no thrombus.  Her most recent echo 2017 EF 45-50% She was last seen 10/07/2019.  Echocardiogram 10/13/2019 showed an ejection fraction of 40% mild right  ventricular dysfunction and no significant valvular abnormality. Compliance with diet, lifestyle and medications: Yes  She has had vague GI upset in general just does not feel as well with diffuse weakness.  No edema shortness of breath chest pain or syncope she also has intermittent palpitation her heart feels irregular but not frequent sustained or severe Past Medical History:  Diagnosis Date  . Acute respiratory failure with hypoxia ([place]) 06/16/2012  . Anxiety 06/15/2012  . Bipolar disorder, unspecified ([place]) 08/29/2014  . Cerebral embolism with cerebral infarction ([place]) 06/20/2012   Formatting of this note might be different from the original. Last Assessment & Plan:  No signs of bleeding, continue coumadin.  Marland Kitchen Cerebral infarction [facility]) 06/26/2012   Formatting of this note might be different from the original. Last Assessment & Plan:  Continue speech therapy and will restart PT once speech finished.  . CHF (congestive heart failure) ([place])   . Chronic combined systolic (congestive) and diastolic (congestive) heart failure ([place]) 06/16/2012   She was seen in the emergency room after [facility] 11/16/2018 with decompensated heart failure.  She was last seen in the heart failure program 03/06/2016 and at that time her ejection fraction was 45 to 50%.  It was not reassessed however her BNP level was significantly elevated from baseline at 724.  Formatting of this note might be different from the original. Last Assessment & Plan:   . Chronic systolic heart failure ([place]) 06/15/2012   Formatting of this note might be different from the original. Last  Assessment & Plan:  NYHA II. Volume status stable. Continue lasix as needed. Stop digoxin. Cut lisinopril back to 5 mg at bed time. Follow up in 3 months with an ECHO.   Patient seen and examined with Darrick Grinder, NP. We discussed all aspects of the encounter. I agree with the assessment and plan as stated above. She is much improved  . Diabetes  mellitus   . Diabetes mellitus ([place]) 06/16/2012  . Dizziness 02/02/2014  . Fatigue 02/02/2014  . History of cerebral embolism 06/20/2012   Formatting of this note might be different from the original. Overview:  Last Assessment & Plan:  No signs of bleeding, continue coumadin.  Marland Kitchen HTN (hypertension) 06/15/2012   Formatting of this note might be different from the original. Last Assessment & Plan:  Controlled but as above will increase lisinopril 5 mg for afterload reduction.  Last Assessment & Plan:  Controlled but as above will increase lisinopril 5 mg for afterload reduction.  . Hypertension   . Hypertensive heart disease 06/15/2012   Formatting of this note might be different from the original. Last Assessment & Plan:  Controlled but as above will increase lisinopril 5 mg for afterload reduction.  . Long term (current) use of anticoagulants 11/11/2012  . Lower urinary tract infectious disease 02/02/2014   Formatting of this note might be different from the original. IMO Problem List Replacer Jan. 2016  . Migraines   . 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 Last Assessment & Plan:  Will continue coumadin until EF has recovered.  The patient has expressed interest in NOAC agents but there is no indication for NOAC agents in patients with LV thrombus.  Have discussed with the patient and she voices understanding.  Repeat echo in 2 months.  . Nonischemic cardiomyopathy ([place]) 06/17/2012   Formatting of this note might be different from the original. Last Assessment & Plan:  EF 35-40% but patient exhibits no signs of HF.  Will continue to titrate HF meds with lisinopril 5 mg BID.  Will follow up in 1 month in hopes to titrate carvedilol.  Will repeat echo in 2 months.  Continue sliding scale lasix.    Last Assessment & Plan:  EF 35-40% but patient exhibits no signs of HF.  Will cont  . Palpitations 08/20/2012   Formatting of this note might be  different from the original. Last Assessment & Plan:  Daily palpitations. Place Holter monitor for 48 hours.  Attending: I am concerned she may be having NSVT. However she did not have much ectopy while in the hospital. Will place 48-hour holter. May need to consider ICD if EF not improving.  Marland Kitchen Positive D dimer 06/16/2012  . Pulmonary hypertension ([place]) 06/16/2012  . SOB (shortness of breath) 06/15/2012  . Stroke ([place])   . Stroke, embolic ([place]) 83/15/1761   Formatting of this note might be different from the original. Last Assessment & Plan:  No signs of bleeding, continue coumadin.  Last Assessment & Plan:  No signs of bleeding, continue coumadin.    Past Surgical History:  Procedure Laterality Date  . LEFT AND RIGHT HEART CATHETERIZATION WITH CORONARY ANGIOGRAM N/A 06/16/2012   Procedure: LEFT AND RIGHT HEART CATHETERIZATION WITH CORONARY ANGIOGRAM;  Surgeon: Jolaine Artist, MD;  Location: [facility] CATH LAB;  Service: Cardiovascular;  Laterality: N/A;  . WISDOM TOOTH EXTRACTION      Current Medications: Current Meds  Medication Sig  .  acetaminophen (TYLENOL) 500 MG tablet Take 1,000 mg by mouth every 6 (six) hours as needed for pain.   Marland Kitchen ALPRAZolam (XANAX) 0.5 MG tablet Take 0.5 mg by mouth 2 (two) times daily as needed for anxiety.  . ARIPiprazole (ABILIFY) 2 MG tablet Take 2 mg by mouth at bedtime.  Marland Kitchen aspirin EC 81 MG tablet Take 1 tablet (81 mg total) by mouth daily.  . carvedilol (COREG) 3.125 MG tablet Take 1 tablet (3.125 mg total) by mouth 2 (two) times daily with a meal. Please call for OV [phone]  . citalopram (CELEXA) 20 MG tablet Take 40 mg by mouth every morning.   . clobetasol cream (TEMOVATE) 0.05 % APPLY SMALL AMOUNT AA BID  . DULoxetine (CYMBALTA) 60 MG capsule Take 60 mg by mouth 2 (two) times daily.  Marland Kitchen ENTRESTO 24-26 MG TAKE 1 TABLET BY MOUTH TWICE DAILY  . fosfomycin (MONUROL) 3 g PACK Take 3 g by mouth every 3 (three) days.  . furosemide (LASIX) 20 MG tablet Take  20 mg by mouth daily.  Marland Kitchen gabapentin (NEURONTIN) 800 MG tablet Take 800 mg by mouth in the morning, at noon, and at bedtime.  Marland Kitchen glipiZIDE (GLUCOTROL XL) 5 MG 24 hr tablet Take 10 mg by mouth daily.   Marland Kitchen HYDROcodone-acetaminophen (NORCO/VICODIN) 5-325 MG tablet Take 1 tablet by mouth every 8 (eight) hours as needed for pain.  . Linaclotide (LINZESS) 145 MCG CAPS capsule Take 1 capsule by mouth daily. Reported on 03/06/2016  . metFORMIN (GLUCOPHAGE-XR) 500 MG 24 hr tablet Take 1,500 mg by mouth daily with breakfast.  . mirabegron ER (MYRBETRIQ) 50 MG TB24 tablet Take 50 mg by mouth daily.  . nitrofurantoin, macrocrystal-monohydrate, (MACROBID) 100 MG capsule   . ONE TOUCH ULTRA TEST test strip   . oxybutynin (DITROPAN) 5 MG tablet   . phenazopyridine (PYRIDIUM) 200 MG tablet Take 1 tablet by mouth 3 (three) times daily as needed.  . zolpidem (AMBIEN) 5 MG tablet Take 5 mg by mouth at bedtime as needed.     Allergies:   Penicillins   Social History   Socioeconomic History  . Marital status: Divorced    Spouse name: Not on file  . Number of children: Not on file  . Years of education: Not on file  . Highest education level: Not on file  Occupational History  . Not on file  Tobacco Use  . Smoking status: Never Smoker  . Smokeless tobacco: Never Used  Substance and Sexual Activity  . Alcohol use: Yes    Comment: 1 mixed drink every 6 months  . Drug use: No  . Sexual activity: Yes    Birth control/protection: Inserts  Other Topics Concern  . Not on file  Social History Narrative  . Not on file   Social Determinants of Health   Financial Resource Strain:   . Difficulty of Paying Living Expenses:   Food Insecurity:   . Worried About Charity fundraiser in the Last Year:   . Arboriculturist in the Last Year:   Transportation Needs:   . Film/video editor (Medical):   Marland Kitchen Lack of Transportation (Non-Medical):   Physical Activity:   . Days of Exercise per Week:   . Minutes of  Exercise per Session:   Stress:   . Feeling of Stress :   Social Connections:   . Frequency of Communication with Friends and Family:   . Frequency of Social Gatherings with Friends and Family:   .  Attends Religious Services:   . Active Member of Clubs or Organizations:   . Attends Archivist Meetings:   Marland Kitchen Marital Status:      Family History: The patient's family history includes Heart disease in her father; Hypertension in her father; Multiple myeloma in her mother; Stroke in her maternal grandmother. ROS:   Please see the history of present illness.    All other systems reviewed and are negative.  EKGs/Labs/Other Studies Reviewed:    The following studies were reviewed today:   Recent Labs: No results found for requested labs within last 8760 hours.  Recent Lipid Panel    Component Value Date/Time   CHOL 121 06/17/2012 0455   TRIG 169 (H) 06/17/2012 0455   HDL 23 (L) 06/17/2012 0455   CHOLHDL 5.3 06/17/2012 0455   VLDL 34 06/17/2012 0455   LDLCALC 64 06/17/2012 0455    Physical Exam:    VS:  BP 128/70 (BP Location: Right Arm, Patient Position: Sitting, Cuff Size: Normal)   Pulse 94   Ht '5\' 5"'  (1.651 m)   Wt 205 lb 12.8 oz (93.4 kg)   SpO2 98%   BMI 34.25 kg/m     Wt Readings from Last 3 Encounters:  02/07/20 205 lb 12.8 oz (93.4 kg)  10/07/19 200 lb 1.9 oz (90.8 kg)  11/16/18 187 lb 2.7 oz (84.9 kg)     GEN:  Well nourished, well developed in no acute distress HEENT: Normal NECK: No JVD; No carotid bruits LYMPHATICS: No lymphadenopathy CARDIAC: RRR, no murmurs, rubs, gallops RESPIRATORY:  Clear to auscultation without rales, wheezing or rhonchi  ABDOMEN: Soft, non-tender, non-distended MUSCULOSKELETAL:  No edema; No deformity  SKIN: Warm and dry NEUROLOGIC:  Alert and oriented x 3 PSYCHIATRIC:  Normal affect    Signed, Shirlee More, MD  02/07/2020 2:19 PM    [facility]

## 2020-02-07 ENCOUNTER — Encounter: Payer: Self-pay | Admitting: Cardiology

## 2020-02-07 ENCOUNTER — Ambulatory Visit (INDEPENDENT_AMBULATORY_CARE_PROVIDER_SITE_OTHER): Payer: Medicare PPO

## 2020-02-07 ENCOUNTER — Other Ambulatory Visit: Payer: Self-pay

## 2020-02-07 ENCOUNTER — Ambulatory Visit: Payer: Medicare PPO | Admitting: Cardiology

## 2020-02-07 VITALS — BP 128/70 | HR 94 | Ht 65.0 in | Wt 205.8 lb

## 2020-02-07 DIAGNOSIS — E119 Type 2 diabetes mellitus without complications: Secondary | ICD-10-CM | POA: Diagnosis not present

## 2020-02-07 DIAGNOSIS — I110 Hypertensive heart disease with heart failure: Secondary | ICD-10-CM

## 2020-02-07 DIAGNOSIS — R002 Palpitations: Secondary | ICD-10-CM | POA: Diagnosis not present

## 2020-02-07 DIAGNOSIS — I5042 Chronic combined systolic (congestive) and diastolic (congestive) heart failure: Secondary | ICD-10-CM

## 2020-02-07 NOTE — Patient Instructions (Signed)
Medication Instructions:  Your physician recommends that you continue on your current medications as directed. Please refer to the Current Medication list given to you today.  *If you need a refill on your cardiac medications before your next appointment, please call your pharmacy*   Lab Work: Your physician recommends that you return for lab work in: TODAY CBC, BMP, ProBNP If you have labs (blood work) drawn today and your tests are completely normal, you will receive your results only by: Marland Kitchen MyChart Message (if you have MyChart) OR . A paper copy in the mail If you have any lab test that is abnormal or we need to change your treatment, we will call you to review the results.   Testing/Procedures: A zio monitor was ordered today. It will remain on for 7 days. You will then return monitor and event diary in provided box. It takes 1-2 weeks for report to be downloaded and returned to [place]. We will call you with the results. If monitor falls off or has orange flashing light, please call Zio for further instructions.      Follow-Up: At [facility], you and your health needs are our priority.  As part of our continuing mission to provide you with exceptional heart care, we have created designated Provider Care Teams.  These Care Teams include your primary Cardiologist (physician) and Advanced Practice Providers (APPs -  Physician Assistants and Nurse Practitioners) who all work together to provide you with the care you need, when you need it.  We recommend signing up for the patient portal called "MyChart".  Sign up information is provided on this After Visit Summary.  MyChart is used to connect with patients for Virtual Visits (Telemedicine).  Patients are able to view lab/test results, encounter notes, upcoming appointments, etc.  Non-urgent messages can be sent to your provider as well.   To learn more about what you can do with MyChart, go to [email or web address].    Your next  appointment:   3 month(s)  The format for your next appointment:   In Person  Provider:   Norman Herrlich, MD   Other Instructions

## 2020-02-08 ENCOUNTER — Telehealth: Payer: Self-pay

## 2020-02-08 LAB — BASIC METABOLIC PANEL
BUN/Creatinine Ratio: 21 (ref 9–23)
BUN: 15 mg/dL (ref 6–24)
CO2: 21 mmol/L (ref 20–29)
Calcium: 9.2 mg/dL (ref 8.7–10.2)
Chloride: 100 mmol/L (ref 96–106)
Creatinine, Ser: 0.71 mg/dL (ref 0.57–1.00)
GFR calc Af Amer: 112 mL/min/{1.73_m2} (ref >59)
GFR calc non Af Amer: 98 mL/min/{1.73_m2} (ref >59)
Glucose: 227 mg/dL — ABNORMAL HIGH (ref 65–99)
Potassium: 4.4 mmol/L (ref 3.5–5.2)
Sodium: 137 mmol/L (ref 134–144)

## 2020-02-08 LAB — CBC
Hematocrit: 38.6 % (ref 34.0–46.6)
Hemoglobin: 12.8 g/dL (ref 11.1–15.9)
MCH: 29.4 pg (ref 26.6–33.0)
MCHC: 33.2 g/dL (ref 31.5–35.7)
MCV: 89 fL (ref 79–97)
Platelets: 363 10*3/uL (ref 150–450)
RBC: 4.35 x10E6/uL (ref 3.77–5.28)
RDW: 12.2 % (ref 11.7–15.4)
WBC: 12.3 10*3/uL — ABNORMAL HIGH (ref 3.4–10.8)

## 2020-02-08 LAB — PRO B NATRIURETIC PEPTIDE: NT-Pro BNP: 597 pg/mL — ABNORMAL HIGH (ref 0–249)

## 2020-02-08 NOTE — Telephone Encounter (Signed)
Left message on patients voicemail to please return our call.   

## 2020-02-08 NOTE — Telephone Encounter (Signed)
Spoke with patient regarding results and recommendation.  Patient verbalizes understanding and is agreeable to plan of care. Advised patient to call back with any issues or concerns.  

## 2020-02-08 NOTE — Telephone Encounter (Signed)
-----   Message from Baldo Daub, MD sent at 02/08/2020  7:35 AM EDT ----- Normal or stable result  No changes, labs are good except her white count is elevated,

## 2020-03-02 ENCOUNTER — Other Ambulatory Visit: Payer: Self-pay | Admitting: Cardiology

## 2020-05-14 NOTE — Progress Notes (Deleted)
Cardiology Office Note:    Date:  05/14/2020   ID:  [name], [name] 06/16/1966, MRN [id number]  PCP:  Elisabeth Cara, PA-C  Cardiologist:  Shirlee More, MD    Referring MD: Belva Bertin, [place] E, *    ASSESSMENT:    No diagnosis found. PLAN:    In order of problems listed above:  1. ***   Next appointment: ***   Medication Adjustments/Labs and Tests Ordered: Current medicines are reviewed at length with the patient today.  Concerns regarding medicines are outlined above.  No orders of the defined types were placed in this encounter.  No orders of the defined types were placed in this encounter.   No chief complaint on file.   History of Present Illness:    Margaret Hess is a 54 y.o. female with a hx of heart failure with mildly reduced ejection fraction most recently 10/13/2019 40% with mild right ventricular dysfunction, type 2 diabetes, stroke and hypertensive heart disease.  She was last seen 02/07/2020 with a prominent complaint of palpitation.  She is dizzy on monitor for 9 days showing occasional APCs but no episodes of atrial fibrillation. Compliance with diet, lifestyle and medications: *** Past Medical History:  Diagnosis Date  . Acute respiratory failure with hypoxia ([place]) 06/16/2012  . Anxiety 06/15/2012  . Bipolar disorder, unspecified ([place]) 08/29/2014  . Cerebral embolism with cerebral infarction ([place]) 06/20/2012   Formatting of this note might be different from the original. Last Assessment & Plan:  No signs of bleeding, continue coumadin.  Marland Kitchen Cerebral infarction [facility]) 06/26/2012   Formatting of this note might be different from the original. Last Assessment & Plan:  Continue speech therapy and will restart PT once speech finished.  . CHF (congestive heart failure) ([place])   . Chronic combined systolic (congestive) and diastolic (congestive) heart failure ([place]) 06/16/2012   She was seen in the emergency room after [facility]  11/16/2018 with decompensated heart failure.  She was last seen in the heart failure program 03/06/2016 and at that time her ejection fraction was 45 to 50%.  It was not reassessed however her BNP level was significantly elevated from baseline at 724.  Formatting of this note might be different from the original. Last Assessment & Plan:   . Chronic systolic heart failure ([place]) 06/15/2012   Formatting of this note might be different from the original. Last Assessment & Plan:  NYHA II. Volume status stable. Continue lasix as needed. Stop digoxin. Cut lisinopril back to 5 mg at bed time. Follow up in 3 months with an ECHO.   Patient seen and examined with Darrick Grinder, NP. We discussed all aspects of the encounter. I agree with the assessment and plan as stated above. She is much improved  . Diabetes mellitus   . Diabetes mellitus ([place]) 06/16/2012  . Dizziness 02/02/2014  . Fatigue 02/02/2014  . History of cerebral embolism 06/20/2012   Formatting of this note might be different from the original. Overview:  Last Assessment & Plan:  No signs of bleeding, continue coumadin.  Marland Kitchen HTN (hypertension) 06/15/2012   Formatting of this note might be different from the original. Last Assessment & Plan:  Controlled but as above will increase lisinopril 5 mg for afterload reduction.  Last Assessment & Plan:  Controlled but as above will increase lisinopril 5 mg for afterload reduction.  . Hypertension   . Hypertensive heart disease 06/15/2012   Formatting of this note might be different from the  original. Last Assessment & Plan:  Controlled but as above will increase lisinopril 5 mg for afterload reduction.  . Long term (current) use of anticoagulants 11/11/2012  . Lower urinary tract infectious disease 02/02/2014   Formatting of this note might be different from the original. IMO Problem List Replacer Jan. 2016  . Migraines   . 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 Last Assessment & Plan:  Will continue coumadin until EF has recovered.  The patient has expressed interest in NOAC agents but there is no indication for NOAC agents in patients with LV thrombus.  Have discussed with the patient and she voices understanding.  Repeat echo in 2 months.  . Nonischemic cardiomyopathy ([place]) 06/17/2012   Formatting of this note might be different from the original. Last Assessment & Plan:  EF 35-40% but patient exhibits no signs of HF.  Will continue to titrate HF meds with lisinopril 5 mg BID.  Will follow up in 1 month in hopes to titrate carvedilol.  Will repeat echo in 2 months.  Continue sliding scale lasix.    Last Assessment & Plan:  EF 35-40% but patient exhibits no signs of HF.  Will cont  . Palpitations 08/20/2012   Formatting of this note might be different from the original. Last Assessment & Plan:  Daily palpitations. Place Holter monitor for 48 hours.  Attending: I am concerned she may be having NSVT. However she did not have much ectopy while in the hospital. Will place 48-hour holter. May need to consider ICD if EF not improving.  Marland Kitchen Positive D dimer 06/16/2012  . Pulmonary hypertension ([place]) 06/16/2012  . SOB (shortness of breath) 06/15/2012  . Stroke ([place])   . Stroke, embolic ([place]) 71/24/5809   Formatting of this note might be different from the original. Last Assessment & Plan:  No signs of bleeding, continue coumadin.  Last Assessment & Plan:  No signs of bleeding, continue coumadin.    Past Surgical History:  Procedure Laterality Date  . LEFT AND RIGHT HEART CATHETERIZATION WITH CORONARY ANGIOGRAM N/A 06/16/2012   Procedure: LEFT AND RIGHT HEART CATHETERIZATION WITH CORONARY ANGIOGRAM;  Surgeon: Jolaine Artist, MD;  Location[facility] CATH LAB;  Service: Cardiovascular;  Laterality: N/A;  . WISDOM TOOTH EXTRACTION      Current Medications: No outpatient medications have been marked as taking for the 05/15/20 encounter (Appointment) with  Richardo Priest, MD.     Allergies:   Penicillins   Social History   Socioeconomic History  . Marital status: Divorced    Spouse name: Not on file  . Number of children: Not on file  . Years of education: Not on file  . Highest education level: Not on file  Occupational History  . Not on file  Tobacco Use  . Smoking status: Never Smoker  . Smokeless tobacco: Never Used  Vaping Use  . Vaping Use: Never used  Substance and Sexual Activity  . Alcohol use: Yes    Comment: 1 mixed drink every 6 months  . Drug use: No  . Sexual activity: Yes    Birth control/protection: Inserts  Other Topics Concern  . Not on file  Social History Narrative  . Not on file   Social Determinants of Health   Financial Resource Strain:   . Difficulty of Paying Living Expenses: Not on file  Food Insecurity:   . Worried About Charity fundraiser in the Last  Year: Not on file  . Ran Out of Food in the Last Year: Not on file  Transportation Needs:   . Lack of Transportation (Medical): Not on file  . Lack of Transportation (Non-Medical): Not on file  Physical Activity:   . Days of Exercise per Week: Not on file  . Minutes of Exercise per Session: Not on file  Stress:   . Feeling of Stress : Not on file  Social Connections:   . Frequency of Communication with Friends and Family: Not on file  . Frequency of Social Gatherings with Friends and Family: Not on file  . Attends Religious Services: Not on file  . Active Member of Clubs or Organizations: Not on file  . Attends Archivist Meetings: Not on file  . Marital Status: Not on file     Family History: The patient's ***family history includes Heart disease in her father; Hypertension in her father; Multiple myeloma in her mother; Stroke in her maternal grandmother. ROS:   Please see the history of present illness.    All other systems reviewed and are negative.  EKGs/Labs/Other Studies Reviewed:    The following studies were  reviewed today:  EKG:  EKG ordered today and personally reviewed.  The ekg ordered today demonstrates ***  Recent Labs: 02/07/2020: BUN 15; Creatinine, Ser 0.71; Hemoglobin 12.8; NT-Pro BNP 597; Platelets 363; Potassium 4.4; Sodium 137  Recent Lipid Panel    Component Value Date/Time   CHOL 121 06/17/2012 0455   TRIG 169 (H) 06/17/2012 0455   HDL 23 (L) 06/17/2012 0455   CHOLHDL 5.3 06/17/2012 0455   VLDL 34 06/17/2012 0455   LDLCALC 64 06/17/2012 0455    Physical Exam:    VS:  There were no vitals taken for this visit.    Wt Readings from Last 3 Encounters:  02/07/20 205 lb 12.8 oz (93.4 kg)  10/07/19 200 lb 1.9 oz (90.8 kg)  11/16/18 187 lb 2.7 oz (84.9 kg)     GEN: *** Well nourished, well developed in no acute distress HEENT: Normal NECK: No JVD; No carotid bruits LYMPHATICS: No lymphadenopathy CARDIAC: ***RRR, no murmurs, rubs, gallops RESPIRATORY:  Clear to auscultation without rales, wheezing or rhonchi  ABDOMEN: Soft, non-tender, non-distended MUSCULOSKELETAL:  No edema; No deformity  SKIN: Warm and dry NEUROLOGIC:  Alert and oriented x 3 PSYCHIATRIC:  Normal affect    Signed, Shirlee More, MD  05/14/2020 5:45 PM    [place]

## 2020-05-15 ENCOUNTER — Ambulatory Visit: Payer: Medicare PPO | Admitting: Cardiology

## 2020-10-08 ENCOUNTER — Other Ambulatory Visit: Payer: Self-pay

## 2020-10-08 ENCOUNTER — Emergency Department (HOSPITAL_BASED_OUTPATIENT_CLINIC_OR_DEPARTMENT_OTHER): Payer: Medicare PPO

## 2020-10-08 ENCOUNTER — Emergency Department (HOSPITAL_BASED_OUTPATIENT_CLINIC_OR_DEPARTMENT_OTHER)
Admission: EM | Admit: 2020-10-08 | Discharge: 2020-10-08 | Disposition: A | Discharge status: Home / Self Care | Payer: Medicare PPO | Attending: Emergency Medicine | Admitting: Emergency Medicine

## 2020-10-08 ENCOUNTER — Encounter (HOSPITAL_BASED_OUTPATIENT_CLINIC_OR_DEPARTMENT_OTHER): Payer: Self-pay | Admitting: Emergency Medicine

## 2020-10-08 DIAGNOSIS — I110 Hypertensive heart disease with heart failure: Secondary | ICD-10-CM | POA: Diagnosis not present

## 2020-10-08 DIAGNOSIS — K625 Hemorrhage of anus and rectum: Secondary | ICD-10-CM | POA: Diagnosis present

## 2020-10-08 DIAGNOSIS — R000 Tachycardia, unspecified: Secondary | ICD-10-CM | POA: Insufficient documentation

## 2020-10-08 DIAGNOSIS — I5042 Chronic combined systolic (congestive) and diastolic (congestive) heart failure: Secondary | ICD-10-CM | POA: Diagnosis not present

## 2020-10-08 DIAGNOSIS — Z7984 Long term (current) use of oral hypoglycemic drugs: Secondary | ICD-10-CM | POA: Insufficient documentation

## 2020-10-08 DIAGNOSIS — Z20822 Contact with and (suspected) exposure to covid-19: Secondary | ICD-10-CM | POA: Diagnosis not present

## 2020-10-08 DIAGNOSIS — E119 Type 2 diabetes mellitus without complications: Secondary | ICD-10-CM | POA: Insufficient documentation

## 2020-10-08 DIAGNOSIS — Z955 Presence of coronary angioplasty implant and graft: Secondary | ICD-10-CM | POA: Insufficient documentation

## 2020-10-08 DIAGNOSIS — E860 Dehydration: Secondary | ICD-10-CM

## 2020-10-08 DIAGNOSIS — Z7982 Long term (current) use of aspirin: Secondary | ICD-10-CM | POA: Diagnosis not present

## 2020-10-08 DIAGNOSIS — K529 Noninfective gastroenteritis and colitis, unspecified: Secondary | ICD-10-CM | POA: Insufficient documentation

## 2020-10-08 DIAGNOSIS — K922 Gastrointestinal hemorrhage, unspecified: Secondary | ICD-10-CM | POA: Diagnosis present

## 2020-10-08 DIAGNOSIS — N3000 Acute cystitis without hematuria: Secondary | ICD-10-CM | POA: Diagnosis not present

## 2020-10-08 DIAGNOSIS — Z7901 Long term (current) use of anticoagulants: Secondary | ICD-10-CM | POA: Insufficient documentation

## 2020-10-08 DIAGNOSIS — Z79899 Other long term (current) drug therapy: Secondary | ICD-10-CM | POA: Diagnosis not present

## 2020-10-08 HISTORY — DX: Gastrointestinal hemorrhage, unspecified: K92.2

## 2020-10-08 LAB — COMPREHENSIVE METABOLIC PANEL
ALT: 34 U/L (ref 0–44)
AST: 30 U/L (ref 15–41)
Albumin: 3.2 g/dL — ABNORMAL LOW (ref 3.5–5.0)
Alkaline Phosphatase: 88 U/L (ref 38–126)
Anion gap: 20 — ABNORMAL HIGH (ref 5–15)
BUN: 12 mg/dL (ref 6–20)
CO2: 20 mmol/L — ABNORMAL LOW (ref 22–32)
Calcium: 9.4 mg/dL (ref 8.9–10.3)
Chloride: 92 mmol/L — ABNORMAL LOW (ref 98–111)
Creatinine, Ser: 0.99 mg/dL (ref 0.44–1.00)
GFR, Estimated: >60 mL/min (ref >60)
Glucose, Bld: 485 mg/dL — ABNORMAL HIGH (ref 70–99)
Potassium: 3.7 mmol/L (ref 3.5–5.1)
Sodium: 132 mmol/L — ABNORMAL LOW (ref 135–145)
Total Bilirubin: 0.8 mg/dL (ref 0.3–1.2)
Total Protein: 8 g/dL (ref 6.5–8.1)

## 2020-10-08 LAB — URINALYSIS, ROUTINE W REFLEX MICROSCOPIC
Bilirubin Urine: NEGATIVE
Glucose, UA: >=500 mg/dL — AB
Hgb urine dipstick: NEGATIVE
Ketones, ur: 80 mg/dL — AB
Leukocytes,Ua: NEGATIVE
Nitrite: POSITIVE — AB
Protein, ur: 100 mg/dL — AB
Specific Gravity, Urine: 1.02 (ref 1.005–1.030)
pH: 6 (ref 5.0–8.0)

## 2020-10-08 LAB — BASIC METABOLIC PANEL
Anion gap: 11 (ref 5–15)
BUN: 14 mg/dL (ref 6–20)
CO2: 26 mmol/L (ref 22–32)
Calcium: 8.6 mg/dL — ABNORMAL LOW (ref 8.9–10.3)
Chloride: 100 mmol/L (ref 98–111)
Creatinine, Ser: 0.71 mg/dL (ref 0.44–1.00)
GFR, Estimated: >60 mL/min (ref >60)
Glucose, Bld: 103 mg/dL — ABNORMAL HIGH (ref 70–99)
Potassium: 3 mmol/L — ABNORMAL LOW (ref 3.5–5.1)
Sodium: 137 mmol/L (ref 135–145)

## 2020-10-08 LAB — CBC WITH DIFFERENTIAL/PLATELET
Abs Immature Granulocytes: 0.11 10*3/uL — ABNORMAL HIGH (ref 0.00–0.07)
Basophils Absolute: 0.1 10*3/uL (ref 0.0–0.1)
Basophils Relative: 0 %
Eosinophils Absolute: 0 10*3/uL (ref 0.0–0.5)
Eosinophils Relative: 0 %
HCT: 46.5 % — ABNORMAL HIGH (ref 36.0–46.0)
Hemoglobin: 15.5 g/dL — ABNORMAL HIGH (ref 12.0–15.0)
Immature Granulocytes: 1 %
Lymphocytes Relative: 14 %
Lymphs Abs: 2.7 10*3/uL (ref 0.7–4.0)
MCH: 28.9 pg (ref 26.0–34.0)
MCHC: 33.3 g/dL (ref 30.0–36.0)
MCV: 86.8 fL (ref 80.0–100.0)
Monocytes Absolute: 0.9 10*3/uL (ref 0.1–1.0)
Monocytes Relative: 5 %
Neutro Abs: 14.9 10*3/uL — ABNORMAL HIGH (ref 1.7–7.7)
Neutrophils Relative %: 80 %
Platelets: 519 10*3/uL — ABNORMAL HIGH (ref 150–400)
RBC: 5.36 MIL/uL — ABNORMAL HIGH (ref 3.87–5.11)
RDW: 12.7 % (ref 11.5–15.5)
WBC: 18.7 10*3/uL — ABNORMAL HIGH (ref 4.0–10.5)
nRBC: 0 % (ref 0.0–0.2)

## 2020-10-08 LAB — BETA-HYDROXYBUTYRIC ACID
Beta-Hydroxybutyric Acid: 2.93 mmol/L — ABNORMAL HIGH (ref 0.05–0.27)
Beta-Hydroxybutyric Acid: 0.07 mmol/L (ref 0.05–0.27)

## 2020-10-08 LAB — CBG MONITORING, ED
Glucose-Capillary: 398 mg/dL — ABNORMAL HIGH (ref 70–99)
Glucose-Capillary: 358 mg/dL — ABNORMAL HIGH (ref 70–99)
Glucose-Capillary: 255 mg/dL — ABNORMAL HIGH (ref 70–99)
Glucose-Capillary: 215 mg/dL — ABNORMAL HIGH (ref 70–99)
Glucose-Capillary: 154 mg/dL — ABNORMAL HIGH (ref 70–99)
Glucose-Capillary: 208 mg/dL — ABNORMAL HIGH (ref 70–99)
Glucose-Capillary: 204 mg/dL — ABNORMAL HIGH (ref 70–99)
Glucose-Capillary: 101 mg/dL — ABNORMAL HIGH (ref 70–99)
Glucose-Capillary: 140 mg/dL — ABNORMAL HIGH (ref 70–99)

## 2020-10-08 LAB — OCCULT BLOOD X 1 CARD TO LAB, STOOL: Fecal Occult Bld: POSITIVE — AB

## 2020-10-08 LAB — PREGNANCY, URINE: Preg Test, Ur: NEGATIVE

## 2020-10-08 LAB — URINALYSIS, MICROSCOPIC (REFLEX)

## 2020-10-08 LAB — SARS CORONAVIRUS 2 BY RT PCR (HOSPITAL ORDER, PERFORMED IN ~~LOC~~ HOSPITAL LAB): SARS Coronavirus 2: NEGATIVE

## 2020-10-08 MED ORDER — HYDROCODONE-ACETAMINOPHEN 5-325 MG PO TABS
1.0000 | ORAL_TABLET | Freq: Once | ORAL | Status: AC
  Administered 2020-10-08: 1 via ORAL
  Filled 2020-10-08: qty 1

## 2020-10-08 MED ORDER — DEXTROSE 50 % IV SOLN: 0.0000 mL | INTRAVENOUS | Status: DC | PRN

## 2020-10-08 MED ORDER — METFORMIN HCL ER 500 MG PO TB24
1500.0000 mg | ORAL_TABLET | Freq: Every day | ORAL | Status: DC
Start: 2020-10-08 — End: 2020-10-08

## 2020-10-08 MED ORDER — KETOROLAC TROMETHAMINE 30 MG/ML IJ SOLN
30.0000 mg | Freq: Once | INTRAMUSCULAR | Status: AC
  Administered 2020-10-08: 30 mg via INTRAVENOUS
  Filled 2020-10-08: qty 1

## 2020-10-08 MED ORDER — ASPIRIN EC 81 MG PO TBEC: 81.0000 mg | DELAYED_RELEASE_TABLET | Freq: Every day | ORAL | Status: DC

## 2020-10-08 MED ORDER — GABAPENTIN 400 MG PO CAPS
800.0000 mg | ORAL_CAPSULE | Freq: Three times a day (TID) | ORAL | Status: DC
  Administered 2020-10-08: 800 mg via ORAL
  Filled 2020-10-08 (×2): qty 2
  Filled 2020-10-08: qty 8
  Filled 2020-10-08 (×2): qty 2

## 2020-10-08 MED ORDER — CIPROFLOXACIN HCL 500 MG PO TABS: 500.0000 mg | ORAL_TABLET | Freq: Once | ORAL | Status: DC

## 2020-10-08 MED ORDER — DULOXETINE HCL 60 MG PO CPEP
60.0000 mg | ORAL_CAPSULE | Freq: Two times a day (BID) | ORAL | Status: DC
  Administered 2020-10-08: 60 mg via ORAL
  Filled 2020-10-08 (×3): qty 1

## 2020-10-08 MED ORDER — METRONIDAZOLE 500 MG PO TABS: 500.0000 mg | ORAL_TABLET | Freq: Three times a day (TID) | ORAL | 0 refills | Status: AC

## 2020-10-08 MED ORDER — ARIPIPRAZOLE 2 MG PO TABS: 2.0000 mg | ORAL_TABLET | Freq: Every day | ORAL | Status: DC

## 2020-10-08 MED ORDER — SODIUM CHLORIDE 0.9 % IV SOLN
2.0000 g | Freq: Once | INTRAVENOUS | Status: AC
  Administered 2020-10-08: 2 g via INTRAVENOUS
  Filled 2020-10-08: qty 2

## 2020-10-08 MED ORDER — MIRABEGRON ER 50 MG PO TB24
50.0000 mg | ORAL_TABLET | Freq: Every day | ORAL | Status: DC
  Administered 2020-10-08: 50 mg via ORAL
  Filled 2020-10-08 (×2): qty 1

## 2020-10-08 MED ORDER — GLIPIZIDE ER 10 MG PO TB24
10.0000 mg | ORAL_TABLET | Freq: Every day | ORAL | Status: DC
  Filled 2020-10-08 (×2): qty 1

## 2020-10-08 MED ORDER — ALPRAZOLAM 0.5 MG PO TABS: 0.5000 mg | ORAL_TABLET | Freq: Two times a day (BID) | ORAL | Status: DC | PRN

## 2020-10-08 MED ORDER — ACETAMINOPHEN 500 MG PO TABS
1000.0000 mg | ORAL_TABLET | Freq: Once | ORAL | Status: AC
  Administered 2020-10-08: 1000 mg via ORAL
  Filled 2020-10-08: qty 2

## 2020-10-08 MED ORDER — SACUBITRIL-VALSARTAN 24-26 MG PO TABS
1.0000 | ORAL_TABLET | Freq: Two times a day (BID) | ORAL | Status: DC
  Administered 2020-10-08: 1 via ORAL
  Filled 2020-10-08 (×4): qty 1

## 2020-10-08 MED ORDER — POTASSIUM CHLORIDE ER 10 MEQ PO TBCR: 10.0000 meq | EXTENDED_RELEASE_TABLET | Freq: Every day | ORAL | 0 refills | Status: DC

## 2020-10-08 MED ORDER — CITALOPRAM HYDROBROMIDE 40 MG PO TABS
40.0000 mg | ORAL_TABLET | Freq: Every day | ORAL | Status: DC
  Administered 2020-10-08: 40 mg via ORAL
  Filled 2020-10-08 (×2): qty 1

## 2020-10-08 MED ORDER — SODIUM CHLORIDE 0.9 % IV SOLN
INTRAVENOUS | Status: DC | PRN
  Administered 2020-10-08: 500 mL via INTRAVENOUS

## 2020-10-08 MED ORDER — CIPROFLOXACIN HCL 500 MG PO TABS: 500.0000 mg | ORAL_TABLET | Freq: Two times a day (BID) | ORAL | Status: DC

## 2020-10-08 MED ORDER — METRONIDAZOLE IN NACL 5-0.79 MG/ML-% IV SOLN
500.0000 mg | Freq: Once | INTRAVENOUS | Status: AC
  Administered 2020-10-08: 500 mg via INTRAVENOUS
  Filled 2020-10-08: qty 100

## 2020-10-08 MED ORDER — METRONIDAZOLE 500 MG PO TABS: 500.0000 mg | ORAL_TABLET | Freq: Three times a day (TID) | ORAL | Status: DC

## 2020-10-08 MED ORDER — DEXTROSE IN LACTATED RINGERS 5 % IV SOLN: INTRAVENOUS | Status: DC

## 2020-10-08 MED ORDER — CARVEDILOL 6.25 MG PO TABS
3.1250 mg | ORAL_TABLET | Freq: Two times a day (BID) | ORAL | Status: DC
Start: 2020-10-08 — End: 2020-10-08
  Administered 2020-10-08: 3.125 mg via ORAL
  Filled 2020-10-08: qty 1

## 2020-10-08 MED ORDER — SODIUM CHLORIDE 0.9 % IV BOLUS
500.0000 mL | Freq: Once | INTRAVENOUS | Status: AC
  Administered 2020-10-08: 500 mL via INTRAVENOUS

## 2020-10-08 MED ORDER — LACTATED RINGERS IV SOLN: INTRAVENOUS | Status: DC

## 2020-10-08 MED ORDER — INSULIN REGULAR(HUMAN) IN NACL 100-0.9 UT/100ML-% IV SOLN
INTRAVENOUS | Status: DC
  Administered 2020-10-08: 15 [IU]/h via INTRAVENOUS
  Filled 2020-10-08: qty 100

## 2020-10-08 MED ORDER — SODIUM CHLORIDE 0.9 % IV BOLUS: 500.0000 mL | Freq: Once | INTRAVENOUS | Status: DC

## 2020-10-08 MED ORDER — CIPROFLOXACIN HCL 500 MG PO TABS: 500.0000 mg | ORAL_TABLET | Freq: Two times a day (BID) | ORAL | 0 refills | Status: AC

## 2020-10-08 NOTE — Progress Notes (Signed)
Admission requested from [facility] ED.  Spoke to Dr. Nicanor Alcon.  Patient is a 55 year old female with a past medical history of hypertension, type 2 diabetes, chronic combined CHF, pulmonary hypertension, CVA, and other comorbidities who was seen at urgent care earlier this week for evaluation of fever and sore throat.  She was diagnosed with strep throat and started on treatment.  Her Covid test was negative at that time.  Now presenting to the ED with complaints of nausea, vomiting, diarrhea, and bright red blood per rectum for the past 24 hours.  On exam, she had pink mucus-like material noted in her rectal vault.  FOBT positive.  Hemoglobin 15.5.  WBC count 18.7.  Blood glucose elevated at 485, bicarb 20, anion gap 20.  UA with glucosuria, ketones, and positive nitrite.  SARS-CoV-2 PCR test negative.  CT showing inflammatory changes surrounding the descending and proximal sigmoid colon concerning for colitis.  Also showing left renal collecting system staghorn calculus without obstruction or inflammatory changes.  Patient was given cefepime, Flagyl, and 1 L fluid bolus.  Requested EDP to start insulin infusion as labs are concerning for DKA.  Also requested C. difficile PCR and GI pathogen panel if patient has an episode of diarrhea in the ED.  Progressive care bed requested.  TRH will assume care on arrival to accepting facility. Until arrival, care as per EDP. However, [facility] available 24/7 for questions and assistance.

## 2020-10-08 NOTE — ED Notes (Signed)
She has spoken with Dr. Renaye Rakers and he is d/c her home.

## 2020-10-08 NOTE — ED Provider Notes (Addendum)
Patient was admitted yesterday evening with concerns for colitis, possible UTI, and diabetic ketosis, with anion gap of 20.  She has been boarding in the ED overnight and receiving IV fluids as well as IV insulin, as well as IV antibiotics.  During my shift I had several conversations and assessments of her, and when she told me she felt significantly better, been drinking water at the bedside, and strongly wanted to go home.  She expressed significant reservations about admission to the hospital including possible exposure to Covid and other infections.  She really feels like she can manage at home.  She lives with her fianc.  I think it is reasonable to discharge him ciprofloxacin and Flagyl to treat for both colitis and cystitis.  Her anion gap on repeat BMP is closed.  I will also need to prescribe her some home PO potassium as the insulin drip has caused some hypokalemia at K 3.0.  We talked about return precautions including immediately returning to the ER if she received a phone call about positive blood cultures in the next day or 2.  She verbalized understanding.  I also discussed close PCP follow-up this week if she is going home.   Clinical Course as of 10/12/20 1700  Sun Oct 08, 2020  1505 Potassium(!): 3.0 [KC]  1511 HCT(!): 46.5 [KC]  1511 Gastrointestinal Panel by PCR , Stool [KC]    Clinical Course User Index [KC] [place], Rozann Lesches, Student-PA     Terald Sleeper, MD 10/08/20 1541    Terald Sleeper, MD 10/12/20 1700

## 2020-10-08 NOTE — ED Notes (Signed)
EDP at bedside with RN for DRE. Hemoccult card sent to lab.

## 2020-10-08 NOTE — Discharge Instructions (Addendum)
Instructions  You will need to take your next dose of antibiotics this evening, including ciprofloxacin and flagyl.  You will finish a full week of these antibiotics.  They are treating for colitis (bowel inflammation) and a possible urine infection as well.  For the next 2 days stick to soups, purees, smoothies, and mashed potatoes (soft foods), then you can return to normal food on Tuesday.  Your potassium level was low after your treatment.  It was 3.0.  I started you on potassium pills to begin taking tomorrow morning.  It is very important you call your doctor's office tomorrow morning and ask for an urgent follow up appointment.  You should be seen and have blood tests rechecked this week, including your potassium.  If you get a phone call in the next few days that your blood cultures are positive, you will need to come back immediately to the ER.  A blood infection can be life threatening and needs IV antibiotics.  *  If you begin having worsening belly pain, vomiting, dizziness, lightheadedness, or feel like passing out, please call 911 or return to the ER immediately.

## 2020-10-08 NOTE — ED Triage Notes (Signed)
BRB per rectum since last night, "only when I poopy." states lower abd/pelvic pain. Nausea.

## 2020-10-08 NOTE — ED Provider Notes (Addendum)
Margaret Hess Provider Note   CSN: [id number] Arrival date & time: 10/08/20  4081     History Chief Complaint  Patient presents with  . Rectal Bleeding    Margaret Hess is a 55 y.o. female.  The history is provided by the patient.  Rectal Bleeding Quality:  Bright red Amount:  Moderate Timing:  Intermittent Chronicity:  New Context: diarrhea   Similar prior episodes: no   Relieved by:  Nothing Worsened by:  Nothing Ineffective treatments:  None tried Associated symptoms: fever and vomiting   Associated symptoms: no dizziness   Risk factors: no hx of IBD   Patient with a h/o CVA and CHF presents with nausea and vomiting and diarrhea and rectal bleeding.  Nausea and vomiting started first and she has had bleeding with each stool.  Has had fevers in the past week but was diagnosed with strep throat and has been on antibiotics.  Covid was negative at that time.       Past Medical History:  Diagnosis Date  . Acute respiratory failure with hypoxia ([place]) 06/16/2012  . Anxiety 06/15/2012  . Bipolar disorder, unspecified ([place]) 08/29/2014  . 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 Last Assessment & Plan:  No signs of bleeding, continue coumadin.  Marland Kitchen Cerebral infarction [facility]) 06/26/2012   Formatting of this note might be different from the original. Last Assessment & Plan:  Continue speech therapy and will restart PT once speech finished.  . CHF (congestive heart failure) ([place])   . 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  She was last seen in the heart failure program 03/06/2016 and at that time her ejection fraction was 45 to 50%.  It was not reassessed however her BNP level was significantly elevated  from baseline at 724.  Formatting of this note might be different from the original. Last Assessment & Plan:   . Chronic systolic heart failure ([place]) 06/15/2012   Formatting of this note might be different from the original. Last Assessment & Plan:  NYHA II. Volume status stable. Continue lasix as needed. Stop digoxin. Cut lisinopril back to 5 mg at bed time. Follow up in 3 months with an ECHO.   Patient seen and examined with Darrick Grinder, NP. We discussed all aspects of the encounter. I agree with the assessment and plan as stated above. She is much improved  . Diabetes mellitus   . Diabetes mellitus ([place]) 06/16/2012  . Dizziness 02/02/2014  . Fatigue 02/02/2014  . History of cerebral embolism 06/20/2012   Formatting of this note might be different from the original. Overview:  Last Assessment & Plan:  No signs of bleeding, continue coumadin.  Marland Kitchen HTN (hypertension) 06/15/2012   Formatting of this note might be different from the original. Last Assessment & Plan:  Controlled but as above will increase lisinopril 5 mg for afterload reduction.  Last Assessment & Plan:  Controlled but as above will increase lisinopril 5 mg for afterload reduction.  . Hypertension   . Hypertensive heart disease 06/15/2012   Formatting of this note might be different from the original. Last Assessment & Plan:  Controlled but as above will increase lisinopril 5 mg for afterload reduction.  . Long term (current) use of anticoagulants 11/11/2012  . Lower urinary  tract infectious disease 02/02/2014   Formatting of this note might be different from the original. IMO Problem List Replacer Jan. 2016  . Migraines   . 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 Last Assessment & Plan:  Will continue coumadin until EF has recovered.  The patient has expressed interest in NOAC agents but there is no indication for NOAC agents in patients with LV thrombus.  Have discussed with the  patient and she voices understanding.  Repeat echo in 2 months.  . Nonischemic cardiomyopathy ([place]) 06/17/2012   Formatting of this note might be different from the original. Last Assessment & Plan:  EF 35-40% but patient exhibits no signs of HF.  Will continue to titrate HF meds with lisinopril 5 mg BID.  Will follow up in 1 month in hopes to titrate carvedilol.  Will repeat echo in 2 months.  Continue sliding scale lasix.    Last Assessment & Plan:  EF 35-40% but patient exhibits no signs of HF.  Will cont  . Palpitations 08/20/2012   Formatting of this note might be different from the original. Last Assessment & Plan:  Daily palpitations. Place Holter monitor for 48 hours.  Attending: I am concerned she may be having NSVT. However she did not have much ectopy while in the hospital. Will place 48-hour holter. May need to consider ICD if EF not improving.  Marland Kitchen Positive D dimer 06/16/2012  . Pulmonary hypertension ([place]) 06/16/2012  . SOB (shortness of breath) 06/15/2012  . Stroke ([place])   . 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 Last Assessment & Plan:  No signs of bleeding, continue coumadin.  Last Assessment & Plan:  No signs of bleeding, continue coumadin.    Patient Active Problem List   Diagnosis Date Noted  . Bipolar disorder ([place]) 08/29/2014  . Dizziness 02/02/2014  . Fatigue 02/02/2014  . Lower urinary tract infectious disease 02/02/2014  . Long term (current) use of anticoagulants 11/11/2012  . Palpitations 08/20/2012  . Cerebral infarction ([place]) 06/26/2012  . 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 History of cerebral embolism 06/20/2012  . 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 Nonischemic cardiomyopathy ([place]) 06/17/2012  . Acute respiratory failure with hypoxia ([place]) 06/16/2012  . Positive D dimer 06/16/2012  . Diabetes mellitus ([place]) 06/16/2012  . Pulmonary hypertension ([place]) 06/16/2012  . Chronic  combined systolic (congestive) and diastolic (congestive) heart failure ([place]) 06/16/2012  . 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 Chronic systolic heart failure ([place]) 06/15/2012  . Hypertensive heart disease 06/15/2012  . Anxiety 06/15/2012  . SOB (shortness of breath) 06/15/2012  . HTN (hypertension) 06/15/2012    Past Surgical History:  Procedure Laterality Date  . LEFT AND RIGHT HEART CATHETERIZATION WITH CORONARY ANGIOGRAM N/A 06/16/2012   Procedure: LEFT AND RIGHT HEART CATHETERIZATION WITH CORONARY ANGIOGRAM;  Surgeon: Jolaine Artist, MD;  [facility] LAB;  Service: Cardiovascular;  Laterality: N/A;  . WISDOM TOOTH EXTRACTION       OB History   No obstetric history on file.     Family History  Problem Relation Age of Onset  . Multiple myeloma Mother   . Heart disease Father   . Hypertension Father   . Stroke Maternal Grandmother     Social History   Tobacco Use  . Smoking status: Never Smoker  . Smokeless tobacco: Never Used  Vaping Use  .  Vaping Use: Never used  Substance Use Topics  . Alcohol use: Yes    Comment: 1 mixed drink every 6 months  . Drug use: No    Home Medications Prior to Admission medications   Medication Sig Start Date End Date Taking? Authorizing Provider  acetaminophen (TYLENOL) 500 MG tablet Take 1,000 mg by mouth every 6 (six) hours as needed for pain.     [provider]  ALPRAZolam Duanne Moron) 0.5 MG tablet Take 0.5 mg by mouth 2 (two) times daily as needed for anxiety. 11/01/19   [provider]  ARIPiprazole (ABILIFY) 2 MG tablet Take 2 mg by mouth at bedtime. 11/01/19   [provider]  aspirin EC 81 MG tablet Take 1 tablet (81 mg total) by mouth daily. 03/25/14   Larey Dresser, MD  carvedilol (COREG) 3.125 MG tablet Take 1 tablet (3.125 mg total) by mouth 2 (two) times daily with a meal. Please call for OV [phone] 03/04/18   Bensimhon, Shaune Pascal, MD  citalopram (CELEXA) 20 MG tablet Take 40 mg by  mouth every morning.     [provider]  clobetasol cream (TEMOVATE) 0.05 % APPLY SMALL AMOUNT AA BID 04/14/19   [provider]  DULoxetine (CYMBALTA) 60 MG capsule Take 60 mg by mouth 2 (two) times daily. 11/01/19   [provider]  ENTRESTO 24-26 MG TAKE 1 TABLET BY MOUTH TWICE DAILY 03/02/20   Richardo Priest, MD  fosfomycin (MONUROL) 3 g PACK Take 3 g by mouth every 3 (three) days. 12/23/18   [provider]  furosemide (LASIX) 20 MG tablet Take 20 mg by mouth daily. 11/09/19   [provider]  gabapentin (NEURONTIN) 800 MG tablet Take 800 mg by mouth in the morning, at noon, and at bedtime. 10/11/19   [provider]  glipiZIDE (GLUCOTROL XL) 5 MG 24 hr tablet Take 10 mg by mouth daily.  01/03/20   [provider]  HYDROcodone-acetaminophen (NORCO/VICODIN) 5-325 MG tablet Take 1 tablet by mouth every 8 (eight) hours as needed for pain. 01/03/20   [provider]  Linaclotide Rolan Lipa) 145 MCG CAPS capsule Take 1 capsule by mouth daily. Reported on 03/06/2016 02/04/14   [provider]  metFORMIN (GLUCOPHAGE-XR) 500 MG 24 hr tablet Take 1,500 mg by mouth daily with breakfast. 01/03/20   [provider]  mirabegron ER (MYRBETRIQ) 50 MG TB24 tablet Take 50 mg by mouth daily. 11/09/19   [provider]  nitrofurantoin, macrocrystal-monohydrate, (MACROBID) 100 MG capsule  12/13/19   [provider]  ONE TOUCH ULTRA TEST test strip  03/25/13   [provider]  oxybutynin (DITROPAN) 5 MG tablet  03/18/19   [provider]  phenazopyridine (PYRIDIUM) 200 MG tablet Take 1 tablet by mouth 3 (three) times daily as needed. 10/13/18   [provider]  zolpidem (AMBIEN) 5 MG tablet Take 5 mg by mouth at bedtime as needed. 11/01/19   [provider]    Allergies    Penicillins  Review of Systems   Review of Systems  Constitutional: Positive for fatigue and fever.  HENT: Negative for  congestion.   Eyes: Negative for visual disturbance.  Respiratory: Negative for shortness of breath.   Cardiovascular: Negative for chest pain.  Gastrointestinal: Positive for hematochezia, nausea and vomiting.  Genitourinary: Negative for difficulty urinating.  Musculoskeletal: Negative for back pain.  Skin: Negative for rash.  Neurological: Negative for dizziness.  Psychiatric/Behavioral: Negative for agitation.  All other systems  reviewed and are negative.   Physical Exam Updated Vital Signs BP (!) 158/100   Pulse (!) 127   Temp 98.2 F (36.8 C) (Oral)   Resp 18   LMP 02/01/2016   SpO2 99%   Physical Exam Vitals and nursing note reviewed. Exam conducted with a chaperone present.  Constitutional:      Appearance: Normal appearance. She is not diaphoretic.  HENT:     Head: Normocephalic and atraumatic.     Nose: Nose normal.  Eyes:     Conjunctiva/sclera: Conjunctivae normal.     Pupils: Pupils are equal, round, and reactive to light.  Cardiovascular:     Rate and Rhythm: Regular rhythm. Tachycardia present.     Pulses: Normal pulses.     Heart sounds: Normal heart sounds.  Pulmonary:     Effort: Pulmonary effort is normal.     Breath sounds: Normal breath sounds.  Abdominal:     General: Abdomen is flat. Bowel sounds are normal.     Palpations: Abdomen is soft.     Tenderness: There is no abdominal tenderness. There is no rebound.     Hernia: No hernia is present.  Genitourinary:    Rectum: Guaiac result positive.  Musculoskeletal:        General: Normal range of motion.     Cervical back: Normal range of motion and neck supple.  Skin:    General: Skin is warm and dry.     Capillary Refill: Capillary refill takes less than 2 seconds.  Neurological:     General: No focal deficit present.     Mental Status: She is alert and oriented to person, place, and time.     Deep Tendon Reflexes: Reflexes normal.  Psychiatric:        Mood and Affect: Mood normal.         Behavior: Behavior normal.     ED Results / Procedures / Treatments   Labs (all labs ordered are listed, but only abnormal results are displayed) Results for orders placed or performed during the hospital encounter of 10/08/20  SARS Coronavirus 2 by RT PCR (hospital order, performed in [place] hospital lab) Nasopharyngeal Nasopharyngeal Swab   Specimen: Nasopharyngeal Swab  Result Value Ref Range   SARS Coronavirus 2 NEGATIVE NEGATIVE  CBC with Differential/Platelet  Result Value Ref Range   WBC 18.7 (H) 4.0 - 10.5 K/uL   RBC 5.36 (H) 3.87 - 5.11 MIL/uL   Hemoglobin 15.5 (H) 12.0 - 15.0 g/dL   HCT 46.5 (H) 36.0 - 46.0 %   MCV 86.8 80.0 - 100.0 fL   MCH 28.9 26.0 - 34.0 pg   MCHC 33.3 30.0 - 36.0 g/dL   RDW 12.7 11.5 - 15.5 %   Platelets 519 (H) 150 - 400 K/uL   nRBC 0.0 0.0 - 0.2 %   Neutrophils Relative % 80 %   Neutro Abs 14.9 (H) 1.7 - 7.7 K/uL   Lymphocytes Relative 14 %   Lymphs Abs 2.7 0.7 - 4.0 K/uL   Monocytes Relative 5 %   Monocytes Absolute 0.9 0.1 - 1.0 K/uL   Eosinophils Relative 0 %   Eosinophils Absolute 0.0 0.0 - 0.5 K/uL   Basophils Relative 0 %   Basophils Absolute 0.1 0.0 - 0.1 K/uL   Immature Granulocytes 1 %   Abs Immature Granulocytes 0.11 (H) 0.00 - 0.07 K/uL  Comprehensive metabolic panel  Result Value Ref Range   Sodium 132 (L) 135 - 145 mmol/L  Potassium 3.7 3.5 - 5.1 mmol/L   Chloride 92 (L) 98 - 111 mmol/L   CO2 20 (L) 22 - 32 mmol/L   Glucose, Bld 485 (H) 70 - 99 mg/dL   BUN 12 6 - 20 mg/dL   Creatinine, Ser 0.99 0.44 - 1.00 mg/dL   Calcium 9.4 8.9 - 10.3 mg/dL   Total Protein 8.0 6.5 - 8.1 g/dL   Albumin 3.2 (L) 3.5 - 5.0 g/dL   AST 30 15 - 41 U/L   ALT 34 0 - 44 U/L   Alkaline Phosphatase 88 38 - 126 U/L   Total Bilirubin 0.8 0.3 - 1.2 mg/dL   GFR, Estimated >60 >60 mL/min   Anion gap 20 (H) 5 - 15  Urinalysis, Routine w reflex microscopic Urine, Clean Catch  Result Value Ref Range   Color, Urine YELLOW YELLOW   APPearance  CLEAR CLEAR   Specific Gravity, Urine 1.020 1.005 - 1.030   pH 6.0 5.0 - 8.0   Glucose, UA >=500 (A) NEGATIVE mg/dL   Hgb urine dipstick NEGATIVE NEGATIVE   Bilirubin Urine NEGATIVE NEGATIVE   Ketones, ur 80 (A) NEGATIVE mg/dL   Protein, ur 100 (A) NEGATIVE mg/dL   Nitrite POSITIVE (A) NEGATIVE   Leukocytes,Ua NEGATIVE NEGATIVE  Occult blood card to lab, stool Provider will collect  Result Value Ref Range   Fecal Occult Bld POSITIVE (A) NEGATIVE  Pregnancy, urine  Result Value Ref Range   Preg Test, Ur NEGATIVE NEGATIVE  Urinalysis, Microscopic (reflex)  Result Value Ref Range   RBC / HPF 0-5 0 - 5 RBC/hpf   WBC, UA 6-10 0 - 5 WBC/hpf   Bacteria, UA RARE (A) NONE SEEN   Squamous Epithelial / LPF 0-5 0 - 5   CT Renal Stone Study  Result Date: 10/08/2020 CLINICAL DATA:  Flank pain, kidney stone suspected. Right red blood with stools associated with bowel movements. EXAM: CT ABDOMEN AND PELVIS WITHOUT CONTRAST TECHNIQUE: Multidetector CT imaging of the abdomen and pelvis was performed following the standard protocol without IV contrast. COMPARISON:  Two-view chest x-ray 11/16/2018 FINDINGS: Lower chest: Linear density and dependent airspace disease is present at the right base. Lung bases are otherwise clear. Heart size is normal. Coronary artery calcifications are present in the LAD. No significant pleural or pericardial effusion is present. Hepatobiliary: Diffuse fatty infiltration of the liver is present. No focal lesions are present. The common bile duct and gallbladder are within normal limits. Pancreas: Unremarkable. No pancreatic ductal dilatation or surrounding inflammatory changes. Spleen: Normal in size without focal abnormality. Adrenals/Urinary Tract: Adrenal glands are within normal limits bilaterally. There is some scarring in the upper pole of the right kidney. Right kidney in ureter are otherwise within normal limits. No stone or mass lesion is present. No obstruction is present.  21 mm heterogeneous stone is present in the left renal collecting system with stone fragments extending into the lower pole. No inflammatory changes or obstruction are present. No other mass lesion is present. The distal ureter is within normal limits. The urinary bladder is unremarkable. Stomach/Bowel: The stomach and duodenum are within normal limits. Small bowel is unremarkable. The appendix is visualized and normal. The ascending and transverse colon are within normal limits. Inflammatory changes surround the descending and proximal sigmoid colon. No diverticular changes are present. The more distal sigmoid colon is within normal limits. Vascular/Lymphatic: Atherosclerotic calcifications are present in the aorta and branch vessels without aneurysm. Reproductive: Fluid is present within the vagina. No obstructing  lesion is evident. Uterus and adnexa are within normal limits. Other: No abdominal wall hernia or abnormality. No abdominopelvic ascites. Musculoskeletal: Degenerative disc disease is most evident at L4-5 with a vacuum disc and chronic endplate change. Mild central and foraminal narrowing is present. Vertebral body heights and alignment are normal. There is some straightening of the normal lumbar lordosis. Bony pelvis is within normal limits. Hips are located and normal. IMPRESSION: 1. Inflammatory changes surround the descending and proximal sigmoid colon compatible with a nonspecific colitis. 2. 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 No obstruction or inflammatory changes are present. 3. Hepatic steatosis. 4. Coronary artery disease. Scattered calcifications are present within the LAD. 5. Degenerative disc disease at L4-5 with mild central and foraminal narrowing. 6. Aortic Atherosclerosis (ICD10-I70.0). Electronically Signed   By: San Morelle M.D.   On: 10/08/2020 06:07    EKG None  Radiology CT Renal Stone Study  Result Date: 10/08/2020 CLINICAL DATA:   Flank pain, kidney stone suspected. Right red blood with stools associated with bowel movements. EXAM: CT ABDOMEN AND PELVIS WITHOUT CONTRAST TECHNIQUE: Multidetector CT imaging of the abdomen and pelvis was performed following the standard protocol without IV contrast. COMPARISON:  Two-view chest x-ray 11/16/2018 FINDINGS: Lower chest: Linear density and dependent airspace disease is present at the right base. Lung bases are otherwise clear. Heart size is normal. Coronary artery calcifications are present in the LAD. No significant pleural or pericardial effusion is present. Hepatobiliary: Diffuse fatty infiltration of the liver is present. No focal lesions are present. The common bile duct and gallbladder are within normal limits. Pancreas: Unremarkable. No pancreatic ductal dilatation or surrounding inflammatory changes. Spleen: Normal in size without focal abnormality. Adrenals/Urinary Tract: Adrenal glands are within normal limits bilaterally. There is some scarring in the upper pole of the right kidney. Right kidney in ureter are otherwise within normal limits. No stone or mass lesion is present. No obstruction is present. 21 mm heterogeneous stone is present in the left renal collecting system with stone fragments extending into the lower pole. No inflammatory changes or obstruction are present. No other mass lesion is present. The distal ureter is within normal limits. The urinary bladder is unremarkable. Stomach/Bowel: The stomach and duodenum are within normal limits. Small bowel is unremarkable. The appendix is visualized and normal. The ascending and transverse colon are within normal limits. Inflammatory changes surround the descending and proximal sigmoid colon. No diverticular changes are present. The more distal sigmoid colon is within normal limits. Vascular/Lymphatic: Atherosclerotic calcifications are present in the aorta and branch vessels without aneurysm. Reproductive: Fluid is present within  the vagina. No obstructing lesion is evident. Uterus and adnexa are within normal limits. Other: No abdominal wall hernia or abnormality. No abdominopelvic ascites. Musculoskeletal: Degenerative disc disease is most evident at L4-5 with a vacuum disc and chronic endplate change. Mild central and foraminal narrowing is present. Vertebral body heights and alignment are normal. There is some straightening of the normal lumbar lordosis. Bony pelvis is within normal limits. Hips are located and normal. IMPRESSION: 1. Inflammatory changes surround the descending and proximal sigmoid colon compatible with a nonspecific colitis. 2. 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 No obstruction or inflammatory changes are present. 3. Hepatic steatosis. 4. Coronary artery disease. Scattered calcifications are present within the LAD. 5. Degenerative disc disease at L4-5 with mild central and foraminal narrowing. 6. Aortic Atherosclerosis (ICD10-I70.0). Electronically Signed   By: Wynetta Fines.D.  On: 10/08/2020 06:07    Procedures Procedures   Medications Ordered in ED Medications  ceFEPIme (MAXIPIME) 2 g in sodium chloride 0.9 % 100 mL IVPB (has no administration in time range)    And  metroNIDAZOLE (FLAGYL) IVPB 500 mg (has no administration in time range)  sodium chloride 0.9 % bolus 500 mL (has no administration in time range)  insulin regular, human (MYXREDLIN) 100 units/ 100 mL infusion (has no administration in time range)  lactated ringers infusion (has no administration in time range)  dextrose 5 % in lactated ringers infusion (has no administration in time range)  dextrose 50 % solution 0-50 mL (has no administration in time range)  sodium chloride 0.9 % bolus 500 mL (500 mLs Intravenous New Bag/Given 10/08/20 0415)  ketorolac (TORADOL) 30 MG/ML injection 30 mg (30 mg Intravenous Given 10/08/20 0643)  acetaminophen (TYLENOL) tablet 1,000 mg (1,000 mg Oral Given 10/08/20  8309)   I do not believe the patient is in DKA, I believe the gap is due to nausea vomiting and diarrhea but insulin drip was started in the ED.    MDM Reviewed: previous chart, nursing note and vitals Reviewed previous: labs Interpretation: labs and CT scan (normal creatinine, elevated white count) Total time providing critical care: 30-74 minutes (insulin drip initiated by me with multiple antibiotics ). This excludes time spent performing separately reportable procedures and services. Consults: admitting MD  CRITICAL CARE Performed by: Kylian Loh K Hy Swiatek-Rasch Total critical care time: 60 minutes Critical care time was exclusive of separately billable procedures and treating other patients. Critical care was necessary to treat or prevent imminent or life-threatening deterioration. Critical care was time spent personally by me on the following activities: development of treatment plan with patient and/or surrogate as well as nursing, discussions with consultants, evaluation of patient's response to treatment, examination of patient, obtaining history from patient or surrogate, ordering and performing treatments and interventions, ordering and review of laboratory studies, ordering and review of radiographic studies, pulse oximetry and re-evaluation of patient's condition.  ED Course  I have reviewed the triage vital signs and the nursing notes.  Pertinent labs & imaging results that were available during my care of the patient were reviewed by me and considered in my medical decision making (see chart for details).  Will admit for colitis and acute rectal bleeding with associated UTI Final Clinical Impression(s) / ED Diagnoses Final diagnoses:  Colitis  Acute cystitis without hematuria    Admit to medicine, per Dr. Marlowe Sax please start insulin drip     Gyanna Jarema, MD 10/08/20 4076

## 2020-10-13 LAB — CULTURE, BLOOD (ROUTINE X 2)
Culture: NO GROWTH
Culture: NO GROWTH
Special Requests: ADEQUATE
Special Requests: ADEQUATE

## 2020-10-17 ENCOUNTER — Other Ambulatory Visit: Payer: Self-pay

## 2020-10-17 ENCOUNTER — Encounter (HOSPITAL_BASED_OUTPATIENT_CLINIC_OR_DEPARTMENT_OTHER): Payer: Self-pay

## 2020-10-17 ENCOUNTER — Emergency Department (HOSPITAL_BASED_OUTPATIENT_CLINIC_OR_DEPARTMENT_OTHER)
Admission: EM | Admit: 2020-10-17 | Discharge: 2020-10-17 | Disposition: A | Discharge status: Home / Self Care | Payer: Medicare PPO | Attending: Emergency Medicine | Admitting: Emergency Medicine

## 2020-10-17 DIAGNOSIS — I110 Hypertensive heart disease with heart failure: Secondary | ICD-10-CM | POA: Diagnosis not present

## 2020-10-17 DIAGNOSIS — R739 Hyperglycemia, unspecified: Secondary | ICD-10-CM | POA: Diagnosis present

## 2020-10-17 DIAGNOSIS — E1165 Type 2 diabetes mellitus with hyperglycemia: Secondary | ICD-10-CM | POA: Diagnosis not present

## 2020-10-17 DIAGNOSIS — Z7982 Long term (current) use of aspirin: Secondary | ICD-10-CM | POA: Insufficient documentation

## 2020-10-17 DIAGNOSIS — Z79899 Other long term (current) drug therapy: Secondary | ICD-10-CM | POA: Insufficient documentation

## 2020-10-17 DIAGNOSIS — E860 Dehydration: Secondary | ICD-10-CM | POA: Diagnosis not present

## 2020-10-17 DIAGNOSIS — I5042 Chronic combined systolic (congestive) and diastolic (congestive) heart failure: Secondary | ICD-10-CM | POA: Diagnosis not present

## 2020-10-17 DIAGNOSIS — Z7984 Long term (current) use of oral hypoglycemic drugs: Secondary | ICD-10-CM | POA: Diagnosis not present

## 2020-10-17 LAB — I-STAT VENOUS BLOOD GAS, ED
Acid-base deficit: 2 mmol/L (ref 0.0–2.0)
Bicarbonate: 23.1 mmol/L (ref 20.0–28.0)
Calcium, Ion: 1.22 mmol/L (ref 1.15–1.40)
HCT: 41 % (ref 36.0–46.0)
Hemoglobin: 13.9 g/dL (ref 12.0–15.0)
O2 Saturation: 69 %
Potassium: 4.6 mmol/L (ref 3.5–5.1)
Sodium: 132 mmol/L — ABNORMAL LOW (ref 135–145)
TCO2: 24 mmol/L (ref 22–32)
pCO2, Ven: 38.7 mmHg — ABNORMAL LOW (ref 44.0–60.0)
pH, Ven: 7.384 (ref 7.250–7.430)
pO2, Ven: 37 mmHg (ref 32.0–45.0)

## 2020-10-17 LAB — COMPREHENSIVE METABOLIC PANEL
ALT: 33 U/L (ref 0–44)
AST: 42 U/L — ABNORMAL HIGH (ref 15–41)
Albumin: 3.4 g/dL — ABNORMAL LOW (ref 3.5–5.0)
Alkaline Phosphatase: 48 U/L (ref 38–126)
Anion gap: 13 (ref 5–15)
BUN: 14 mg/dL (ref 6–20)
CO2: 20 mmol/L — ABNORMAL LOW (ref 22–32)
Calcium: 9.1 mg/dL (ref 8.9–10.3)
Chloride: 96 mmol/L — ABNORMAL LOW (ref 98–111)
Creatinine, Ser: 0.83 mg/dL (ref 0.44–1.00)
GFR, Estimated: >60 mL/min (ref >60)
Glucose, Bld: 489 mg/dL — ABNORMAL HIGH (ref 70–99)
Potassium: 4.5 mmol/L (ref 3.5–5.1)
Sodium: 129 mmol/L — ABNORMAL LOW (ref 135–145)
Total Bilirubin: 0.2 mg/dL — ABNORMAL LOW (ref 0.3–1.2)
Total Protein: 7.1 g/dL (ref 6.5–8.1)

## 2020-10-17 LAB — CBC WITH DIFFERENTIAL/PLATELET
Abs Immature Granulocytes: 0.06 10*3/uL (ref 0.00–0.07)
Basophils Absolute: 0.1 10*3/uL (ref 0.0–0.1)
Basophils Relative: 0 %
Eosinophils Absolute: 0.1 10*3/uL (ref 0.0–0.5)
Eosinophils Relative: 1 %
HCT: 38.8 % (ref 36.0–46.0)
Hemoglobin: 12.9 g/dL (ref 12.0–15.0)
Immature Granulocytes: 1 %
Lymphocytes Relative: 26 %
Lymphs Abs: 2.9 10*3/uL (ref 0.7–4.0)
MCH: 29 pg (ref 26.0–34.0)
MCHC: 33.2 g/dL (ref 30.0–36.0)
MCV: 87.2 fL (ref 80.0–100.0)
Monocytes Absolute: 0.6 10*3/uL (ref 0.1–1.0)
Monocytes Relative: 6 %
Neutro Abs: 7.5 10*3/uL (ref 1.7–7.7)
Neutrophils Relative %: 66 %
Platelets: 449 10*3/uL — ABNORMAL HIGH (ref 150–400)
RBC: 4.45 MIL/uL (ref 3.87–5.11)
RDW: 12.6 % (ref 11.5–15.5)
WBC: 11.3 10*3/uL — ABNORMAL HIGH (ref 4.0–10.5)
nRBC: 0 % (ref 0.0–0.2)

## 2020-10-17 LAB — BETA-HYDROXYBUTYRIC ACID: Beta-Hydroxybutyric Acid: 0.2 mmol/L (ref 0.05–0.27)

## 2020-10-17 LAB — CBG MONITORING, ED
Glucose-Capillary: 469 mg/dL — ABNORMAL HIGH (ref 70–99)
Glucose-Capillary: 248 mg/dL — ABNORMAL HIGH (ref 70–99)

## 2020-10-17 LAB — LIPASE, BLOOD: Lipase: 58 U/L — ABNORMAL HIGH (ref 11–51)

## 2020-10-17 MED ORDER — INSULIN REGULAR HUMAN 100 UNIT/ML IJ SOLN: 6.0000 [IU] | Freq: Once | INTRAMUSCULAR | Status: DC

## 2020-10-17 MED ORDER — SODIUM CHLORIDE 0.9 % IV BOLUS
1000.0000 mL | Freq: Once | INTRAVENOUS | Status: AC
  Administered 2020-10-17: 1000 mL via INTRAVENOUS

## 2020-10-17 MED ORDER — INSULIN ASPART 100 UNIT/ML ~~LOC~~ SOLN
6.0000 [IU] | Freq: Once | SUBCUTANEOUS | Status: AC
  Administered 2020-10-17: 6 [IU] via SUBCUTANEOUS
  Filled 2020-10-17: qty 6

## 2020-10-17 NOTE — Discharge Instructions (Addendum)
You were seen in the emergency department for evaluation of abnormal labs done by your PCP.  You were slightly dehydrated here and your sugars were elevated.  You received some insulin and IV fluids with improvement.  Please stay well-hydrated and closely follow-up with your doctor regarding management of your diabetes.  Return to the emergency department if any worsening or concerning symptoms

## 2020-10-17 NOTE — ED Triage Notes (Signed)
Pt arrives stating her PCP sent her here for dehydration, "problem with liver" and elevated blood glucose. Had bloodwork drawn yesterday. Been having diarrhea. Was seen here on 2/6.

## 2020-10-17 NOTE — ED Notes (Signed)
Assisted pt with bedside commode. 

## 2020-10-17 NOTE — ED Provider Notes (Signed)
[facility] EMERGENCY DEPARTMENT Provider Note   CSN: [id number] Arrival date & time: 10/17/20  1106     History Chief Complaint  Patient presents with  . Abnormal Lab    Margaret Hess is a 55 y.o. female.  She has a history of stroke, CHF, diabetes.  She was here about a week ago for rectal bleeding and hyperglycemia with some element of DKA.  She was admitted but this ultimately cleared while she was still boarding in the emergency department and was discharged on antibiotics.  She had a follow-up appointment with her primary care office yesterday and they did some lab work.  She could received a call today that there were some abnormalities and she needed to be seen.  She still has decreased appetite, on and off diarrhea.  No fevers or chills no chest or abdominal pain.  No more rectal bleeding.  The history is provided by the patient.  Abnormal Lab Time since result:  1 day Patient referred by:  PCP Resulting agency:  External Result type: chemistry and LFT/amylase/lipase   Chemistry:    Glucose:  High LFT / amylase / lipase:    AST (SGOT):  High Diarrhea Quality:  Semi-solid Severity:  Moderate Onset quality:  Gradual Duration:  1 week Timing:  Intermittent Progression:  Improving Relieved by:  Nothing Worsened by:  Nothing Ineffective treatments:  None tried Associated symptoms: no abdominal pain, no recent cough, no fever, no headaches and no vomiting   Risk factors: recent antibiotic use        Past Medical History:  Diagnosis Date  . Acute respiratory failure with hypoxia ([place]) 06/16/2012  . Anxiety 06/15/2012  . Bipolar disorder, unspecified ([place]) 08/29/2014  . 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 Last Assessment & Plan:  No signs of bleeding, continue coumadin.  Marland Kitchen Cerebral infarction [facility]) 06/26/2012   Formatting of this note might be different from the  original. Last Assessment & Plan:  Continue speech therapy and will restart PT once speech finished.  . CHF (congestive heart failure) ([place])   . 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  She was last seen in the heart failure program 03/06/2016 and at that time her ejection fraction was 45 to 50%.  It was not reassessed however her BNP level was significantly elevated from baseline at 724.  Formatting of this note might be different from the original. Last Assessment & Plan:   . Chronic systolic heart failure ([place]) 06/15/2012   Formatting of this note might be different from the original. Last Assessment & Plan:  NYHA II. Volume status stable. Continue lasix as needed. Stop digoxin. Cut lisinopril back to 5 mg at bed time. Follow up in 3 months with an ECHO.   Patient seen and examined with Darrick Grinder, NP. We discussed all aspects of the encounter. I agree with the assessment and plan as stated above. She is much improved  . Diabetes mellitus   . Diabetes mellitus ([place]) 06/16/2012  . Dizziness 02/02/2014  . Fatigue 02/02/2014  . History of cerebral embolism 06/20/2012   Formatting of this note might be different from the original. Overview:  Last Assessment & Plan:  No signs of bleeding, continue coumadin.  Marland Kitchen HTN (hypertension) 06/15/2012   Formatting of this note  might be different from the original. Last Assessment & Plan:  Controlled but as above will increase lisinopril 5 mg for afterload reduction.  Last Assessment & Plan:  Controlled but as above will increase lisinopril 5 mg for afterload reduction.  . Hypertension   . Hypertensive heart disease 06/15/2012   Formatting of this note might be different from the original. Last Assessment & Plan:  Controlled but as above will increase lisinopril 5 mg for afterload reduction.  . Long term  (current) use of anticoagulants 11/11/2012  . Lower urinary tract infectious disease 02/02/2014   Formatting of this note might be different from the original. IMO Problem List Replacer Jan. 2016  . Migraines   . 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 Last Assessment & Plan:  Will continue coumadin until EF has recovered.  The patient has expressed interest in NOAC agents but there is no indication for NOAC agents in patients with LV thrombus.  Have discussed with the patient and she voices understanding.  Repeat echo in 2 months.  . Nonischemic cardiomyopathy ([place]) 06/17/2012   Formatting of this note might be different from the original. Last Assessment & Plan:  EF 35-40% but patient exhibits no signs of HF.  Will continue to titrate HF meds with lisinopril 5 mg BID.  Will follow up in 1 month in hopes to titrate carvedilol.  Will repeat echo in 2 months.  Continue sliding scale lasix.    Last Assessment & Plan:  EF 35-40% but patient exhibits no signs of HF.  Will cont  . Palpitations 08/20/2012   Formatting of this note might be different from the original. Last Assessment & Plan:  Daily palpitations. Place Holter monitor for 48 hours.  Attending: I am concerned she may be having NSVT. However she did not have much ectopy while in the hospital. Will place 48-hour holter. May need to consider ICD if EF not improving.  Marland Kitchen Positive D dimer 06/16/2012  . Pulmonary hypertension ([place]) 06/16/2012  . SOB (shortness of breath) 06/15/2012  . Stroke ([place])   . 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 Last Assessment & Plan:  No signs of bleeding, continue coumadin.  Last Assessment & Plan:  No signs of bleeding, continue coumadin.    Patient Active Problem List   Diagnosis Date Noted  . GI bleed 10/08/2020  . Bipolar disorder ([place]) 08/29/2014  . Dizziness 02/02/2014  . Fatigue 02/02/2014  .  Lower urinary tract infectious disease 02/02/2014  . Long term (current) use of anticoagulants 11/11/2012  . Palpitations 08/20/2012  . Cerebral infarction ([place]) 06/26/2012  . 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 History of cerebral embolism 06/20/2012  . 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 Nonischemic cardiomyopathy ([place]) 06/17/2012  . Acute respiratory failure with hypoxia ([place]) 06/16/2012  . Positive D dimer 06/16/2012  . Diabetes mellitus ([place]) 06/16/2012  . Pulmonary hypertension ([place]) 06/16/2012  . Chronic combined systolic (congestive) and diastolic (congestive) heart failure ([place]) 06/16/2012  . 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 Chronic systolic heart failure ([place]) 06/15/2012  . Hypertensive heart disease 06/15/2012  . Anxiety 06/15/2012  . SOB (shortness of breath) 06/15/2012  . HTN (hypertension) 06/15/2012    Past Surgical History:  Procedure Laterality Date  . LEFT AND RIGHT HEART CATHETERIZATION WITH CORONARY ANGIOGRAM N/A 06/16/2012   Procedure: LEFT AND RIGHT HEART CATHETERIZATION WITH  CORONARY ANGIOGRAM;  Surgeon: Jolaine Artist, MD;  Location: [facility] CATH LAB;  Service: Cardiovascular;  Laterality: N/A;  . WISDOM TOOTH EXTRACTION       OB History   No obstetric history on file.     Family History  Problem Relation Age of Onset  . Multiple myeloma Mother   . Heart disease Father   . Hypertension Father   . Stroke Maternal Grandmother     Social History   Tobacco Use  . Smoking status: Never Smoker  . Smokeless tobacco: Never Used  Vaping Use  . Vaping Use: Never used  Substance Use Topics  . Alcohol use: Yes    Comment: 1 mixed drink every 6 months  . Drug use: No    Home Medications Prior to Admission medications   Medication Sig Start Date End Date Taking? Authorizing Provider  acetaminophen (TYLENOL) 500 MG tablet Take 1,000 mg by mouth every 6 (six) hours as needed for pain.     [provider]   ALPRAZolam Duanne Moron) 0.5 MG tablet Take 0.5 mg by mouth 2 (two) times daily as needed for anxiety. 11/01/19   [provider]  ARIPiprazole (ABILIFY) 2 MG tablet Take 2 mg by mouth at bedtime. 11/01/19   [provider]  aspirin EC 81 MG tablet Take 1 tablet (81 mg total) by mouth daily. 03/25/14   Larey Dresser, MD  carvedilol (COREG) 3.125 MG tablet Take 1 tablet (3.125 mg total) by mouth 2 (two) times daily with a meal. Please call for OV [phone] 03/04/18   Bensimhon, Shaune Pascal, MD  citalopram (CELEXA) 20 MG tablet Take 40 mg by mouth every morning.     [provider]  clobetasol cream (TEMOVATE) 0.05 % APPLY SMALL AMOUNT AA BID 04/14/19   [provider]  DULoxetine (CYMBALTA) 60 MG capsule Take 60 mg by mouth 2 (two) times daily. 11/01/19   [provider]  ENTRESTO 24-26 MG TAKE 1 TABLET BY MOUTH TWICE DAILY 03/02/20   Richardo Priest, MD  fosfomycin (MONUROL) 3 g PACK Take 3 g by mouth every 3 (three) days. 12/23/18   [provider]  furosemide (LASIX) 20 MG tablet Take 20 mg by mouth daily. 11/09/19   [provider]  gabapentin (NEURONTIN) 800 MG tablet Take 800 mg by mouth in the morning, at noon, and at bedtime. 10/11/19   [provider]  glipiZIDE (GLUCOTROL XL) 5 MG 24 hr tablet Take 10 mg by mouth daily.  01/03/20   [provider]  HYDROcodone-acetaminophen (NORCO/VICODIN) 5-325 MG tablet Take 1 tablet by mouth every 8 (eight) hours as needed for pain. 01/03/20   [provider]  Linaclotide Rolan Lipa) 145 MCG CAPS capsule Take 1 capsule by mouth daily. Reported on 03/06/2016 02/04/14   [provider]  metFORMIN (GLUCOPHAGE-XR) 500 MG 24 hr tablet Take 1,500 mg by mouth daily with breakfast. 01/03/20   [provider]  mirabegron ER (MYRBETRIQ) 50 MG TB24 tablet Take 50 mg by mouth daily. 11/09/19   [provider]  nitrofurantoin, macrocrystal-monohydrate, (MACROBID) 100 MG capsule   12/13/19   [provider]  ONE TOUCH ULTRA TEST test strip  03/25/13   [provider]  oxybutynin (DITROPAN) 5 MG tablet  03/18/19   [provider]  phenazopyridine (PYRIDIUM) 200 MG tablet Take 1 tablet by mouth 3 (three) times daily as needed. 10/13/18   [provider]  potassium chloride (KLOR-CON) 10 MEQ tablet Take 1 tablet (  10 mEq total) by mouth daily for 30 doses. 10/08/20 11/07/20  Wyvonnia Dusky, MD  zolpidem (AMBIEN) 5 MG tablet Take 5 mg by mouth at bedtime as needed. 11/01/19   [provider]    Allergies    Penicillins  Review of Systems   Review of Systems  Constitutional: Positive for appetite change. Negative for fever.  HENT: Negative for sore throat.   Eyes: Negative for visual disturbance.  Respiratory: Negative for shortness of breath.   Cardiovascular: Negative for chest pain.  Gastrointestinal: Positive for diarrhea. Negative for abdominal pain and vomiting.  Genitourinary: Negative for dysuria.  Musculoskeletal: Positive for back pain (chronic).  Skin: Negative for rash.  Neurological: Negative for headaches.    Physical Exam Updated Vital Signs BP (!) 150/90 (BP Location: Right Arm)   Pulse (!) 114   Temp 98.2 F (36.8 C) (Oral)   Resp 18   Ht '5\' 5"'  (1.651 m)   Wt 104 kg   LMP 02/01/2016   SpO2 99%   BMI 38.15 kg/m   Physical Exam Vitals and nursing note reviewed.  Constitutional:      General: She is not in acute distress.    Appearance: Normal appearance. She is well-developed and well-nourished.  HENT:     Head: Normocephalic and atraumatic.  Eyes:     Conjunctiva/sclera: Conjunctivae normal.  Cardiovascular:     Rate and Rhythm: Regular rhythm. Tachycardia present.     Heart sounds: No murmur heard.   Pulmonary:     Effort: Pulmonary effort is normal. No respiratory distress.     Breath sounds: Normal breath sounds.  Abdominal:     Palpations: Abdomen is soft.     Tenderness: There is no  abdominal tenderness. There is no guarding or rebound.  Musculoskeletal:        General: No swelling, tenderness or edema.     Cervical back: Neck supple.  Skin:    General: Skin is warm and dry.  Neurological:     Mental Status: She is alert. Mental status is at baseline.     Comments: She is some contractures of her right arm and her left leg is in a brace.  Psychiatric:        Mood and Affect: Mood and affect normal.     ED Results / Procedures / Treatments   Labs (all labs ordered are listed, but only abnormal results are displayed) Labs Reviewed  COMPREHENSIVE METABOLIC PANEL - Abnormal; Notable for the following components:      Result Value   Sodium 129 (*)    Chloride 96 (*)    CO2 20 (*)    Glucose, Bld 489 (*)    Albumin 3.4 (*)    AST 42 (*)    Total Bilirubin 0.2 (*)    All other components within normal limits  LIPASE, BLOOD - Abnormal; Notable for the following components:   Lipase 58 (*)    All other components within normal limits  CBC WITH DIFFERENTIAL/PLATELET - Abnormal; Notable for the following components:   WBC 11.3 (*)    Platelets 449 (*)    All other components within normal limits  CBG MONITORING, ED - Abnormal; Notable for the following components:   Glucose-Capillary 469 (*)    All other components within normal limits  I-STAT VENOUS BLOOD GAS, ED - Abnormal; Notable for the following components:   pCO2, Ven 38.7 (*)    Sodium 132 (*)    All other components  within normal limits  CBG MONITORING, ED - Abnormal; Notable for the following components:   Glucose-Capillary 248 (*)    All other components within normal limits  BETA-HYDROXYBUTYRIC ACID    EKG None  Radiology No results found.  Procedures Procedures   Medications Ordered in ED Medications  sodium chloride 0.9 % bolus 1,000 mL (0 mLs Intravenous Stopped 10/17/20 1423)  insulin aspart (novoLOG) injection 6 Units (6 Units Subcutaneous Given 10/17/20 1324)    ED Course  I  have reviewed the triage vital signs and the nursing notes.  Pertinent labs & imaging results that were available during my care of the patient were reviewed by me and considered in my medical decision making (see chart for details).  Clinical Course as of 10/17/20 1648  Tue Oct 17, 2020  1302 Labs showing a mildly low bicarb but normal gap and normal pH.  Getting IV fluids.  Will order her some IV insulin. [MB]  3276 When I asked her what her sugars usually run she says she does not check her sugars.  My ask if her sugars are usually high when she comes to the doctor she says yes.  Unable to give me the number though. [MB]    Clinical Course User Index [MB] Hayden Rasmussen, MD   MDM Rules/Calculators/A&P                         This patient complains of abnormal labs, continued diarrhea, improving nausea; this involves an extensive number of treatment Options and is a complaint that carries with it a high risk of complications and Morbidity. The differential includes metabolic derangement, dehydration, renal failure, anemia  I ordered, reviewed and interpreted labs, which included CBC with mildly elevated white count, stable hemoglobin, chemistries with low sodium and elevated glucose consistent with pseudohyponatremia, mild decrease in bicarb but normal gap reflecting some dehydration, VBG with normal pH, LFTs with AST just mildly elevated and lipase mildly elevated nonspecific, beta hydroxybutyrate pending at time of discharge I ordered medication IV fluids and IV insulin Previous records obtained and reviewed in epic including recent ED visit.  After the interventions stated above, I reevaluated the patient and found patient to be minimally symptomatic.  Her glucose is decreased down to 48.  Discussed lab results with her and recommended close follow-up with PCP.  Return instructions discussed.   Final Clinical Impression(s) / ED Diagnoses Final diagnoses:  Hyperglycemia   Dehydration    Rx / DC Orders ED Discharge Orders    None       Hayden Rasmussen, MD 10/17/20 1650

## 2021-01-08 DIAGNOSIS — R000 Tachycardia, unspecified: Secondary | ICD-10-CM

## 2021-01-08 DIAGNOSIS — I5020 Unspecified systolic (congestive) heart failure: Secondary | ICD-10-CM

## 2021-01-08 HISTORY — DX: Unspecified systolic (congestive) heart failure: I50.20

## 2021-01-08 HISTORY — DX: Tachycardia, unspecified: R00.0

## 2021-02-05 DIAGNOSIS — E1165 Type 2 diabetes mellitus with hyperglycemia: Secondary | ICD-10-CM | POA: Insufficient documentation

## 2021-02-05 HISTORY — DX: Type 2 diabetes mellitus with hyperglycemia: E11.65

## 2021-04-11 ENCOUNTER — Inpatient Hospital Stay (HOSPITAL_BASED_OUTPATIENT_CLINIC_OR_DEPARTMENT_OTHER)
Admission: EM | Admit: 2021-04-11 | Discharge: 2021-04-14 | DRG: 871 | Disposition: A | Discharge status: Home / Self Care | Payer: Medicare PPO | Attending: Family Medicine | Admitting: Family Medicine

## 2021-04-11 ENCOUNTER — Other Ambulatory Visit: Payer: Self-pay

## 2021-04-11 ENCOUNTER — Encounter (HOSPITAL_BASED_OUTPATIENT_CLINIC_OR_DEPARTMENT_OTHER): Payer: Self-pay

## 2021-04-11 ENCOUNTER — Emergency Department (HOSPITAL_BASED_OUTPATIENT_CLINIC_OR_DEPARTMENT_OTHER): Payer: Medicare PPO

## 2021-04-11 DIAGNOSIS — I130 Hypertensive heart and chronic kidney disease with heart failure and stage 1 through stage 4 chronic kidney disease, or unspecified chronic kidney disease: Secondary | ICD-10-CM | POA: Diagnosis present

## 2021-04-11 DIAGNOSIS — F419 Anxiety disorder, unspecified: Secondary | ICD-10-CM | POA: Diagnosis present

## 2021-04-11 DIAGNOSIS — N179 Acute kidney failure, unspecified: Secondary | ICD-10-CM | POA: Diagnosis present

## 2021-04-11 DIAGNOSIS — Z794 Long term (current) use of insulin: Secondary | ICD-10-CM

## 2021-04-11 DIAGNOSIS — E119 Type 2 diabetes mellitus without complications: Secondary | ICD-10-CM

## 2021-04-11 DIAGNOSIS — Z8249 Family history of ischemic heart disease and other diseases of the circulatory system: Secondary | ICD-10-CM

## 2021-04-11 DIAGNOSIS — Z823 Family history of stroke: Secondary | ICD-10-CM

## 2021-04-11 DIAGNOSIS — R4182 Altered mental status, unspecified: Secondary | ICD-10-CM | POA: Diagnosis present

## 2021-04-11 DIAGNOSIS — E871 Hypo-osmolality and hyponatremia: Secondary | ICD-10-CM | POA: Diagnosis present

## 2021-04-11 DIAGNOSIS — Z7984 Long term (current) use of oral hypoglycemic drugs: Secondary | ICD-10-CM

## 2021-04-11 DIAGNOSIS — E669 Obesity, unspecified: Secondary | ICD-10-CM | POA: Diagnosis present

## 2021-04-11 DIAGNOSIS — I272 Pulmonary hypertension, unspecified: Secondary | ICD-10-CM | POA: Diagnosis present

## 2021-04-11 DIAGNOSIS — I69354 Hemiplegia and hemiparesis following cerebral infarction affecting left non-dominant side: Secondary | ICD-10-CM

## 2021-04-11 DIAGNOSIS — U071 COVID-19: Secondary | ICD-10-CM | POA: Diagnosis present

## 2021-04-11 DIAGNOSIS — D638 Anemia in other chronic diseases classified elsewhere: Secondary | ICD-10-CM | POA: Diagnosis present

## 2021-04-11 DIAGNOSIS — A419 Sepsis, unspecified organism: Principal | ICD-10-CM | POA: Diagnosis present

## 2021-04-11 DIAGNOSIS — N390 Urinary tract infection, site not specified: Secondary | ICD-10-CM | POA: Diagnosis present

## 2021-04-11 DIAGNOSIS — Z7982 Long term (current) use of aspirin: Secondary | ICD-10-CM

## 2021-04-11 DIAGNOSIS — I5042 Chronic combined systolic (congestive) and diastolic (congestive) heart failure: Secondary | ICD-10-CM | POA: Diagnosis present

## 2021-04-11 DIAGNOSIS — I69320 Aphasia following cerebral infarction: Secondary | ICD-10-CM

## 2021-04-11 DIAGNOSIS — Z6838 Body mass index (BMI) 38.0-38.9, adult: Secondary | ICD-10-CM | POA: Diagnosis not present

## 2021-04-11 DIAGNOSIS — I959 Hypotension, unspecified: Secondary | ICD-10-CM | POA: Diagnosis present

## 2021-04-11 DIAGNOSIS — F319 Bipolar disorder, unspecified: Secondary | ICD-10-CM | POA: Diagnosis present

## 2021-04-11 DIAGNOSIS — I119 Hypertensive heart disease without heart failure: Secondary | ICD-10-CM | POA: Diagnosis present

## 2021-04-11 DIAGNOSIS — Z6830 Body mass index (BMI) 30.0-30.9, adult: Secondary | ICD-10-CM

## 2021-04-11 DIAGNOSIS — Z807 Family history of other malignant neoplasms of lymphoid, hematopoietic and related tissues: Secondary | ICD-10-CM

## 2021-04-11 DIAGNOSIS — Z880 Allergy status to penicillin: Secondary | ICD-10-CM | POA: Diagnosis not present

## 2021-04-11 DIAGNOSIS — E1122 Type 2 diabetes mellitus with diabetic chronic kidney disease: Secondary | ICD-10-CM | POA: Diagnosis present

## 2021-04-11 DIAGNOSIS — E1165 Type 2 diabetes mellitus with hyperglycemia: Secondary | ICD-10-CM

## 2021-04-11 DIAGNOSIS — I428 Other cardiomyopathies: Secondary | ICD-10-CM | POA: Diagnosis present

## 2021-04-11 DIAGNOSIS — Z7901 Long term (current) use of anticoagulants: Secondary | ICD-10-CM

## 2021-04-11 DIAGNOSIS — E11649 Type 2 diabetes mellitus with hypoglycemia without coma: Secondary | ICD-10-CM | POA: Diagnosis present

## 2021-04-11 DIAGNOSIS — Z79899 Other long term (current) drug therapy: Secondary | ICD-10-CM

## 2021-04-11 HISTORY — DX: COVID-19: U07.1

## 2021-04-11 HISTORY — DX: Sepsis, unspecified organism: A41.9

## 2021-04-11 HISTORY — DX: Acute kidney failure, unspecified: N17.9

## 2021-04-11 LAB — URINALYSIS, MICROSCOPIC (REFLEX): WBC, UA: >50 WBC/hpf (ref 0–5)

## 2021-04-11 LAB — CBC WITH DIFFERENTIAL/PLATELET
Abs Immature Granulocytes: 0.14 10*3/uL — ABNORMAL HIGH (ref 0.00–0.07)
Basophils Absolute: 0.1 10*3/uL (ref 0.0–0.1)
Basophils Relative: 0 %
Eosinophils Absolute: 0 10*3/uL (ref 0.0–0.5)
Eosinophils Relative: 0 %
HCT: 36.9 % (ref 36.0–46.0)
Hemoglobin: 12.1 g/dL (ref 12.0–15.0)
Immature Granulocytes: 1 %
Lymphocytes Relative: 13 %
Lymphs Abs: 2.8 10*3/uL (ref 0.7–4.0)
MCH: 28.1 pg (ref 26.0–34.0)
MCHC: 32.8 g/dL (ref 30.0–36.0)
MCV: 85.6 fL (ref 80.0–100.0)
Monocytes Absolute: 2.1 10*3/uL — ABNORMAL HIGH (ref 0.1–1.0)
Monocytes Relative: 9 %
Neutro Abs: 17.3 10*3/uL — ABNORMAL HIGH (ref 1.7–7.7)
Neutrophils Relative %: 77 %
Platelets: 336 10*3/uL (ref 150–400)
RBC: 4.31 MIL/uL (ref 3.87–5.11)
RDW: 14.6 % (ref 11.5–15.5)
WBC: 22.3 10*3/uL — ABNORMAL HIGH (ref 4.0–10.5)
nRBC: 0 % (ref 0.0–0.2)

## 2021-04-11 LAB — PROTIME-INR
INR: 1.1 (ref 0.8–1.2)
Prothrombin Time: 13.9 seconds (ref 11.4–15.2)

## 2021-04-11 LAB — COMPREHENSIVE METABOLIC PANEL
ALT: 13 U/L (ref 0–44)
AST: 20 U/L (ref 15–41)
Albumin: 3.3 g/dL — ABNORMAL LOW (ref 3.5–5.0)
Alkaline Phosphatase: 57 U/L (ref 38–126)
Anion gap: 14 (ref 5–15)
BUN: 56 mg/dL — ABNORMAL HIGH (ref 6–20)
CO2: 20 mmol/L — ABNORMAL LOW (ref 22–32)
Calcium: 7.9 mg/dL — ABNORMAL LOW (ref 8.9–10.3)
Chloride: 93 mmol/L — ABNORMAL LOW (ref 98–111)
Creatinine, Ser: 1.95 mg/dL — ABNORMAL HIGH (ref 0.44–1.00)
GFR, Estimated: 30 mL/min — ABNORMAL LOW (ref >60)
Glucose, Bld: 166 mg/dL — ABNORMAL HIGH (ref 70–99)
Potassium: 3.5 mmol/L (ref 3.5–5.1)
Sodium: 127 mmol/L — ABNORMAL LOW (ref 135–145)
Total Bilirubin: 0.4 mg/dL (ref 0.3–1.2)
Total Protein: 7.8 g/dL (ref 6.5–8.1)

## 2021-04-11 LAB — APTT: aPTT: 30 seconds (ref 24–36)

## 2021-04-11 LAB — URINALYSIS, ROUTINE W REFLEX MICROSCOPIC
Bilirubin Urine: NEGATIVE
Glucose, UA: >=500 mg/dL — AB
Hgb urine dipstick: NEGATIVE
Ketones, ur: NEGATIVE mg/dL
Nitrite: NEGATIVE
Protein, ur: 30 mg/dL — AB
Specific Gravity, Urine: 1.025 (ref 1.005–1.030)
pH: 5.5 (ref 5.0–8.0)

## 2021-04-11 LAB — PREGNANCY, URINE: Preg Test, Ur: NEGATIVE

## 2021-04-11 LAB — GLUCOSE, CAPILLARY
Glucose-Capillary: 55 mg/dL — ABNORMAL LOW (ref 70–99)
Glucose-Capillary: 73 mg/dL (ref 70–99)

## 2021-04-11 LAB — LACTIC ACID, PLASMA: Lactic Acid, Venous: 1.8 mmol/L (ref 0.5–1.9)

## 2021-04-11 MED ORDER — GABAPENTIN 400 MG PO CAPS
800.0000 mg | ORAL_CAPSULE | Freq: Three times a day (TID) | ORAL | Status: DC
  Administered 2021-04-11 – 2021-04-14 (×8): 800 mg via ORAL
  Filled 2021-04-11 (×8): qty 2

## 2021-04-11 MED ORDER — HYDROCODONE-ACETAMINOPHEN 5-325 MG PO TABS
1.0000 | ORAL_TABLET | Freq: Three times a day (TID) | ORAL | Status: DC | PRN
  Administered 2021-04-11 – 2021-04-13 (×5): 1 via ORAL
  Filled 2021-04-11 (×5): qty 1

## 2021-04-11 MED ORDER — CARVEDILOL 3.125 MG PO TABS
3.1250 mg | ORAL_TABLET | Freq: Two times a day (BID) | ORAL | Status: DC
  Administered 2021-04-12 – 2021-04-14 (×5): 3.125 mg via ORAL
  Filled 2021-04-11 (×5): qty 1

## 2021-04-11 MED ORDER — SODIUM CHLORIDE 0.9 % IV BOLUS (SEPSIS)
1000.0000 mL | Freq: Once | INTRAVENOUS | Status: AC
  Administered 2021-04-11: 1000 mL via INTRAVENOUS

## 2021-04-11 MED ORDER — ASPIRIN EC 81 MG PO TBEC
81.0000 mg | DELAYED_RELEASE_TABLET | Freq: Every day | ORAL | Status: DC
  Administered 2021-04-12 – 2021-04-14 (×3): 81 mg via ORAL
  Filled 2021-04-11 (×3): qty 1

## 2021-04-11 MED ORDER — CITALOPRAM HYDROBROMIDE 20 MG PO TABS
40.0000 mg | ORAL_TABLET | ORAL | Status: DC
  Administered 2021-04-12 – 2021-04-14 (×3): 40 mg via ORAL
  Filled 2021-04-11 (×3): qty 2

## 2021-04-11 MED ORDER — SODIUM CHLORIDE 0.9 % IV SOLN: INTRAVENOUS | Status: DC

## 2021-04-11 MED ORDER — ACETAMINOPHEN 500 MG PO TABS: 1000.0000 mg | ORAL_TABLET | Freq: Four times a day (QID) | ORAL | Status: DC | PRN

## 2021-04-11 MED ORDER — MIRABEGRON ER 25 MG PO TB24
50.0000 mg | ORAL_TABLET | Freq: Every day | ORAL | Status: DC
  Administered 2021-04-12 – 2021-04-14 (×3): 50 mg via ORAL
  Filled 2021-04-11 (×3): qty 2

## 2021-04-11 MED ORDER — ARIPIPRAZOLE 2 MG PO TABS
2.0000 mg | ORAL_TABLET | Freq: Every day | ORAL | Status: DC
  Administered 2021-04-11 – 2021-04-14 (×4): 2 mg via ORAL
  Filled 2021-04-11 (×4): qty 1

## 2021-04-11 MED ORDER — GLIPIZIDE 10 MG PO TABS: 10.0000 mg | ORAL_TABLET | Freq: Every day | ORAL | Status: DC

## 2021-04-11 MED ORDER — SODIUM CHLORIDE 0.9 % IV SOLN: 2.0000 g | Freq: Once | INTRAVENOUS | Status: DC

## 2021-04-11 MED ORDER — SODIUM CHLORIDE 0.9 % IV SOLN
2.0000 g | INTRAVENOUS | Status: DC
  Administered 2021-04-11: 2 g via INTRAVENOUS
  Filled 2021-04-11: qty 20

## 2021-04-11 MED ORDER — DULOXETINE HCL 60 MG PO CPEP
60.0000 mg | ORAL_CAPSULE | Freq: Two times a day (BID) | ORAL | Status: DC
  Administered 2021-04-11 – 2021-04-14 (×6): 60 mg via ORAL
  Filled 2021-04-11 (×6): qty 1

## 2021-04-11 MED ORDER — ONDANSETRON 4 MG PO TBDP
4.0000 mg | ORAL_TABLET | ORAL | Status: DC | PRN
  Administered 2021-04-13: 4 mg via ORAL
  Filled 2021-04-11: qty 1

## 2021-04-11 MED ORDER — SODIUM CHLORIDE 0.9 % IV BOLUS
1200.0000 mL | Freq: Once | INTRAVENOUS | Status: AC
  Administered 2021-04-11: 1200 mL via INTRAVENOUS

## 2021-04-11 MED ORDER — FESOTERODINE FUMARATE ER 4 MG PO TB24
4.0000 mg | ORAL_TABLET | Freq: Every day | ORAL | Status: DC
  Administered 2021-04-12 – 2021-04-14 (×3): 4 mg via ORAL
  Filled 2021-04-11 (×3): qty 1

## 2021-04-11 MED ORDER — PANTOPRAZOLE SODIUM 40 MG PO TBEC
40.0000 mg | DELAYED_RELEASE_TABLET | Freq: Every day | ORAL | Status: DC
  Administered 2021-04-12 – 2021-04-14 (×3): 40 mg via ORAL
  Filled 2021-04-11 (×3): qty 1

## 2021-04-11 MED ORDER — SODIUM CHLORIDE 0.9 % IV BOLUS
500.0000 mL | Freq: Once | INTRAVENOUS | Status: AC
  Administered 2021-04-11: 500 mL via INTRAVENOUS

## 2021-04-11 MED ORDER — GABAPENTIN 400 MG PO CAPS: 800.0000 mg | ORAL_CAPSULE | Freq: Two times a day (BID) | ORAL | Status: DC

## 2021-04-11 MED ORDER — ALPRAZOLAM 0.5 MG PO TABS
0.5000 mg | ORAL_TABLET | Freq: Two times a day (BID) | ORAL | Status: DC | PRN
  Administered 2021-04-11 – 2021-04-13 (×3): 0.5 mg via ORAL
  Filled 2021-04-11 (×3): qty 1

## 2021-04-11 MED ORDER — SODIUM CHLORIDE 0.9 % IV SOLN
2.0000 g | Freq: Two times a day (BID) | INTRAVENOUS | Status: DC
  Administered 2021-04-11 – 2021-04-12 (×2): 2 g via INTRAVENOUS
  Filled 2021-04-11 (×2): qty 2

## 2021-04-11 MED ORDER — HEPARIN SODIUM (PORCINE) 5000 UNIT/ML IJ SOLN
5000.0000 [IU] | Freq: Three times a day (TID) | INTRAMUSCULAR | Status: DC
  Filled 2021-04-11 (×3): qty 1

## 2021-04-11 MED ORDER — CLOBETASOL PROPIONATE 0.05 % EX CREA
TOPICAL_CREAM | Freq: Two times a day (BID) | CUTANEOUS | Status: DC
  Filled 2021-04-11: qty 15

## 2021-04-11 MED ORDER — INSULIN GLARGINE-YFGN 100 UNIT/ML ~~LOC~~ SOLN
20.0000 [IU] | Freq: Every day | SUBCUTANEOUS | Status: DC
  Filled 2021-04-11: qty 0.2

## 2021-04-11 NOTE — ED Triage Notes (Signed)
Pt tested positive for COVID on Saturday. Went to get antibody infusion, but they were unable to get IV. Expressive aphasia and left sided weakness at baseline.   C/o weakness, cough, decreased oral intake.

## 2021-04-11 NOTE — ED Notes (Signed)
ED Provider at bedside. 

## 2021-04-11 NOTE — Sepsis Progress Note (Signed)
Notified provider of need to order fluid bolus.  ?

## 2021-04-11 NOTE — ED Notes (Signed)
[facility] here to get pt 

## 2021-04-11 NOTE — ED Notes (Signed)
Pt given diet soda and crackers.  

## 2021-04-11 NOTE — H&P (Signed)
History and Physical   Margaret Hess NID:[id number] DOB: 11-Jun-1966 DOA: 04/11/2021  Referring MD/NP/PA: Margarita Mail, PA  PCP: Elisabeth Cara, [place]   Outpatient Specialists: Dr. Felipa Eth, urology  Patient coming from: [place]  Chief Complaint: Weakness  HPI: Margaret Hess is a 55 y.o. 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  Patient went to get monoclonal antibodies and while there was noted to be weak tachycardic hypotensive.  She has had persistent nausea with an evidence of vomiting and decreased oral intake over the last few days.  Denied any shortness of breath or cough.  Denied any fever or chills.  Patient is not hypoxic and does not require oxygen.  She was seen in the ER evaluated and now admitted to the hospital for AKI as well as generalized malaise.  She did not get the monoclonal antibodies because they were unable to get adequate veins..  ED Course: Temperature 99.4 blood pressure 72/54, pulse 123 respiratory 23 oxygen sat 95% on room air.  White count is 22.3.  Sodium 127,with potassium 3.5 chloride 93 CO2 20.  BUN 56 and creatinine 1.95.  Calcium is 7.9 glucose 166.  Urinalysis consistent with UTI.  Small leukocytes.  WBC more than 50 and many bacteria.  Chest x-ray showed no acute findings.  Patient being admitted with sepsis secondary to UTI as well as COVID-19 infection.  Review of Systems: As per HPI otherwise 10 point review of systems negative.    Past Medical History:  Diagnosis Date   Acute respiratory failure with hypoxia ([place]) 06/16/2012   Anxiety 06/15/2012   Bipolar disorder, unspecified ([place]) 08/29/2014   Cerebral embolism with cerebral infarction ([place]) 06/20/2012   Formatting of this note might be different from the original. Last Assessment &  Plan:  No signs of bleeding, continue coumadin.   Cerebral infarction [facility]) 06/26/2012   Formatting of this note might be different from the original. Last Assessment & Plan:  Continue speech therapy and will restart PT once speech finished.   CHF (congestive heart failure) (HCC)    Chronic combined systolic (congestive) and diastolic (congestive) heart failure ([place]) 06/16/2012   She was seen in the emergency room after [facility] 11/16/2018 with decompensated heart failure.  She was last seen in the heart failure program 03/06/2016 and at that time her ejection fraction was 45 to 50%.  It was not reassessed however her BNP level was significantly elevated from baseline at 724.  Formatting of this note might be different from the original. Last Assessment & Plan:    Chronic systolic heart failure ([place]) 06/15/2012   Formatting of this note might be different from the original. Last Assessment & Plan:  NYHA II. Volume status stable. Continue lasix as needed. Stop digoxin. Cut lisinopril back to 5 mg at bed time. Follow up in 3 months with an ECHO.   Patient seen and examined with Darrick Grinder, NP. We discussed all aspects of the encounter. I agree with the assessment and plan as stated above. She is much improved   Diabetes mellitus    Diabetes mellitus ([place]) 06/16/2012   Dizziness 02/02/2014   Fatigue 02/02/2014   History of cerebral embolism 06/20/2012   Formatting of this note might be different from the original. Overview:  Last Assessment & Plan:  No signs of bleeding, continue coumadin.  HTN (hypertension) 06/15/2012   Formatting of this note might be different from the original. Last Assessment & Plan:  Controlled but as above will increase lisinopril 5 mg for afterload reduction.  Last Assessment & Plan:  Controlled but as above will increase lisinopril 5 mg for afterload reduction.   Hypertension    Hypertensive heart disease 06/15/2012   Formatting of this note might be different from  the original. Last Assessment & Plan:  Controlled but as above will increase lisinopril 5 mg for afterload reduction.   Long term (current) use of anticoagulants 11/11/2012   Lower urinary tract infectious disease 02/02/2014   Formatting of this note might be different from the original. IMO Problem List Replacer Jan. 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 Last Assessment & Plan:  Will continue coumadin until EF has recovered.  The patient has expressed interest in NOAC agents but there is no indication for NOAC agents in patients with LV thrombus.  Have discussed with the patient and she voices understanding.  Repeat echo in 2 months.   Nonischemic cardiomyopathy ([place]) 06/17/2012   Formatting of this note might be different from the original. Last Assessment & Plan:  EF 35-40% but patient exhibits no signs of HF.  Will continue to titrate HF meds with lisinopril 5 mg BID.  Will follow up in 1 month in hopes to titrate carvedilol.  Will repeat echo in 2 months.  Continue sliding scale lasix.    Last Assessment & Plan:  EF 35-40% but patient exhibits no signs of HF.  Will cont   Palpitations 08/20/2012   Formatting of this note might be different from the original. Last Assessment & Plan:  Daily palpitations. Place Holter monitor for 48 hours.  Attending: I am concerned she may be having NSVT. However she did not have much ectopy while in the hospital. Will place 48-hour holter. May need to consider ICD if EF not improving.   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 Last Assessment & Plan:  No signs of bleeding, continue coumadin.  Last Assessment & Plan:  No signs of bleeding, continue coumadin.    Past Surgical History:  Procedure  Laterality Date   LEFT AND RIGHT HEART CATHETERIZATION WITH CORONARY ANGIOGRAM N/A 06/16/2012   Procedure: LEFT AND RIGHT HEART CATHETERIZATION WITH CORONARY ANGIOGRAM;  Surgeon: Jolaine Artist, MD;  Location: [facility] CATH LAB;  Service: Cardiovascular;  Laterality: N/A;   WISDOM TOOTH EXTRACTION       reports that she has never smoked. She has never used smokeless tobacco. She reports current alcohol use. She reports that she does not use drugs.  Allergies  Allergen Reactions   Penicillins Rash and Other (See Comments)    Tolerates cephalosporins    Family History  Problem Relation Age of Onset   Multiple myeloma Mother    Heart disease Father    Hypertension Father    Stroke Maternal Grandmother      Prior to Admission medications   Medication Sig Start Date End Date Taking? Authorizing Provider  acetaminophen (TYLENOL) 500 MG tablet Take 1,000 mg by mouth every 6 (six) hours as needed for pain.    Yes [provider]  ALPRAZolam (  XANAX) 0.5 MG tablet Take 0.5 mg by mouth 2 (two) times daily as needed for anxiety. 11/01/19  Yes [provider]  ARIPiprazole (ABILIFY) 2 MG tablet Take 2 mg by mouth at bedtime. 11/01/19  Yes [provider]  aspirin EC 81 MG tablet Take 1 tablet (81 mg total) by mouth daily. 03/25/14  Yes Larey Dresser, MD  carvedilol (COREG) 3.125 MG tablet Take 1 tablet (3.125 mg total) by mouth 2 (two) times daily with a meal. Please call for OV [phone] 03/04/18  Yes Bensimhon, Shaune Pascal, MD  citalopram (CELEXA) 20 MG tablet Take 40 mg by mouth every morning.    Yes [provider]  clobetasol cream (TEMOVATE) 0.05 % APPLY SMALL AMOUNT AA BID 04/14/19  Yes [provider]  DULoxetine (CYMBALTA) 60 MG capsule Take 60 mg by mouth 2 (two) times daily. 11/01/19  Yes [provider]  ENTRESTO 24-26 MG TAKE 1 TABLET BY MOUTH TWICE DAILY Patient taking differently: Take 1 tablet by mouth 2 (two) times daily. 03/02/20  Yes  Richardo Priest, MD  furosemide (LASIX) 20 MG tablet Take 20 mg by mouth daily. 11/09/19  Yes [provider]  gabapentin (NEURONTIN) 800 MG tablet Take 800 mg by mouth in the morning, at noon, and at bedtime. 10/11/19  Yes [provider]  glipiZIDE (GLUCOTROL) 10 MG tablet Take 10 mg by mouth daily. 03/19/21  Yes [provider]  HYDROcodone-acetaminophen (NORCO/VICODIN) 5-325 MG tablet Take 1 tablet by mouth every 8 (eight) hours as needed for pain. 01/03/20  Yes [provider]  JARDIANCE 10 MG TABS tablet Take 10 mg by mouth daily. 04/09/21  Yes [provider]  LANTUS SOLOSTAR 100 UNIT/ML Solostar Pen Inject 20 Units into the skin at bedtime. 01/31/21  Yes [provider]  metFORMIN (GLUCOPHAGE-XR) 500 MG 24 hr tablet Take 1,000 mg by mouth 2 (two) times daily. 01/03/20  Yes [provider]  mirabegron ER (MYRBETRIQ) 50 MG TB24 tablet Take 50 mg by mouth daily. 11/09/19  Yes [provider]  naloxone (NARCAN) nasal spray 4 mg/0.1 mL For suspected opioid overdose, spray 1 mL in each nostril.  Repeat after 3 minutes if no or minimal response. 02/02/21  Yes [provider]  nitrofurantoin, macrocrystal-monohydrate, (MACROBID) 100 MG capsule Take 100 mg by mouth 2 (two) times daily. 12/13/19  Yes [provider]  omeprazole (PRILOSEC) 40 MG capsule Take 40 mg by mouth daily. 04/11/21  Yes [provider]  ondansetron (ZOFRAN-ODT) 4 MG disintegrating tablet Take 4 mg by mouth as needed for nausea/vomiting. 04/09/21  Yes [provider]  ONE TOUCH ULTRA TEST test strip 1 each by Other route as needed for other. 03/25/13  Yes [provider]  TOVIAZ 4 MG TB24 tablet Take 4 mg by mouth daily. 04/05/21  Yes [provider]  potassium chloride (KLOR-CON) 10 MEQ tablet Take 1 tablet (10 mEq total) by mouth daily for 30 doses. Patient not taking: Reported on 04/11/2021 10/08/20 04/11/21  Wyvonnia Dusky, MD     Physical Exam: Vitals:   04/11/21 1745 04/11/21 1800 04/11/21 1904 04/11/21 2011  BP: 102/64 100/77 114/73 123/65  Pulse: (!) 103 (!) 104 (!) 113 (!) 117  Resp: _0 Temp:   97.9 F (36.6 C) 98 F (36.7 C)  TempSrc:   Oral Oral  SpO2: 96% 99% 98% 95%  Weight:      Height:  Constitutional: Acutely ill looking, no distress Vitals:   04/11/21 1745 04/11/21 1800 04/11/21 1904 04/11/21 2011  BP: 102/64 100/77 114/73 123/65  Pulse: (!) 103 (!) 104 (!) 113 (!) 117  Resp: _0 Temp:   97.9 F (36.6 C) 98 F (36.7 C)  TempSrc:   Oral Oral  SpO2: 96% 99% 98% 95%  Weight:      Height:       Eyes: PERRL, lids and conjunctivae normal ENMT: Mucous membranes are dry. Posterior pharynx clear of any exudate or lesions.Normal dentition.  Neck: normal, supple, no masses, no thyromegaly Respiratory: clear to auscultation bilaterally, no wheezing, no crackles. Normal respiratory effort. No accessory muscle use.  Cardiovascular: Sinus tachycardia, no murmurs / rubs / gallops. No extremity edema. 2+ pedal pulses. No carotid bruits.  Abdomen: no tenderness, no masses palpated. No hepatosplenomegaly. Bowel sounds positive.  Musculoskeletal: no clubbing / cyanosis. No joint deformity upper and lower extremities. Good ROM, no contractures. Normal muscle tone.  Skin: Dry coarse skin no rashes, lesions, ulcers. No induration Neurologic: CN 2-12 grossly intact. Sensation intact, DTR normal. Strength 5/5 in all 4.  Psychiatric: Normal judgment and insight. Alert and oriented x 3. Normal mood.     Labs on Admission: I have personally reviewed following labs and imaging studies  CBC: Recent Labs  Lab 04/11/21 1423  WBC 22.3*  NEUTROABS 17.3*  HGB 12.1  HCT 36.9  MCV 85.6  PLT 384   Basic Metabolic Panel: Recent Labs  Lab 04/11/21 1423  NA 127*  K 3.5  CL 93*  CO2 20*  GLUCOSE 166*  BUN 56*  CREATININE 1.95*  CALCIUM 7.9*   GFR: Estimated Creatinine  Clearance: 39.5 mL/min (A) (by C-G formula based on SCr of 1.95 mg/dL (H)). Liver Function Tests: Recent Labs  Lab 04/11/21 1423  AST 20  ALT 13  ALKPHOS 57  BILITOT 0.4  PROT 7.8  ALBUMIN 3.3*   No results for input(s): LIPASE, AMYLASE in the last 168 hours. No results for input(s): AMMONIA in the last 168 hours. Coagulation Profile: Recent Labs  Lab 04/11/21 1423  INR 1.1   Cardiac Enzymes: No results for input(s): CKTOTAL, CKMB, CKMBINDEX, TROPONINI in the last 168 hours. BNP (last 3 results) No results for input(s): PROBNP in the last 8760 hours. HbA1C: No results for input(s): HGBA1C in the last 72 hours. CBG: No results for input(s): GLUCAP in the last 168 hours. Lipid Profile: No results for input(s): CHOL, HDL, LDLCALC, TRIG, CHOLHDL, LDLDIRECT in the last 72 hours. Thyroid Function Tests: No results for input(s): TSH, T4TOTAL, FREET4, T3FREE, THYROIDAB in the last 72 hours. Anemia Panel: No results for input(s): VITAMINB12, FOLATE, FERRITIN, TIBC, IRON, RETICCTPCT in the last 72 hours. Urine analysis:    Component Value Date/Time   COLORURINE YELLOW 04/11/2021 1423   APPEARANCEUR CLOUDY (A) 04/11/2021 1423   LABSPEC 1.025 04/11/2021 1423   PHURINE 5.5 04/11/2021 1423   GLUCOSEU >=500 (A) 04/11/2021 1423   HGBUR NEGATIVE 04/11/2021 [place] 04/11/2021 1423   [place] 04/11/2021 1423   PROTEINUR 30 (A) 04/11/2021 1423   UROBILINOGEN 1.0 10/03/2014 1820   NITRITE NEGATIVE 04/11/2021 1423   LEUKOCYTESUR SMALL (A) 04/11/2021 1423   Sepsis Labs: _1 (procalcitonin:4,lacticidven:4) )No results found for this or any previous visit (from the past 240 hour(s)).   Radiological Exams on Admission: DG Chest Port 1 View  Result Date: 04/11/2021 CLINICAL DATA:  Questionable sepsis, positive COVID EXAM: PORTABLE CHEST  1 VIEW COMPARISON:  Chest radiograph 01/08/2021 FINDINGS: The cardiomediastinal silhouette is within normal limits.  Lung volumes are low. There is no focal consolidation or pulmonary edema. There is no pleural effusion or pneumothorax There is no acute osseous abnormality. IMPRESSION: Low lung volumes. Otherwise, no radiographic evidence of acute cardiopulmonary process. Electronically Signed   By: Valetta Mole MD   On: 04/11/2021 15:08      Assessment/Plan Principal Problem:   Sepsis due to urinary tract infection ([place]) Active Problems:   Hypertensive heart disease   Diabetes mellitus ([place])   Pulmonary hypertension ([place])   Nonischemic cardiomyopathy ([place])   Lower urinary tract infectious disease   Sepsis ([place])   COVID-19 virus infection   AKI (acute kidney injury) ([place])     #1 sepsis secondary to UTI: Patient will be admitted to the hospital.  IV Rocephin initiated.  Pain management as well as supportive care.  Her COVID-19 does not seem to be symptomatic at this point.  Continue close monitoring.  Obtain blood and urine cultures and follow.  Adjust antibiotics accordingly.  #2 COVID-19 infection: Largely asymptomatic.  No active treatment at this point.  She may benefit from pack Slo-Bid if she is discharged on time.  #3 non-insulin-dependent diabetes: Sliding scale insulin.  Continue her Lantus.  Has been on glipizide and Jardiance which we will hold for now the Jardiance.  Continue to monitor blood sugar.  #4 UTI: As per #1 above.  #5 essential hypertension: Hold diuretics and Entresto.  Continue with carvedilol.  #6 systolic dysfunction CHF: Last known EF about 40%.  We will gently hydrate the patient.  Continue her carvedilol.  Holding diuretics and Entresto  #7 AKI on CKD: Again hold diuretics and ARB's.  Monitor closely.   DVT prophylaxis: Heparin Code Status: Full code Family Communication: No family at bedside Disposition Plan: Home Consults called: None Admission status: Inpatient  Severity of Illness: The appropriate patient status for this patient is INPATIENT. Inpatient  status is judged to be reasonable and necessary in order to provide the required intensity of service to ensure the patient's safety. The patient's presenting symptoms, physical exam findings, and initial radiographic and laboratory data in the context of their chronic comorbidities is felt to place them at high risk for further clinical deterioration. Furthermore, it is not anticipated that the patient will be medically stable for discharge from the hospital within 2 midnights of admission. The following factors support the patient status of inpatient.   " The patient's presenting symptoms include generalized weakness. " The worrisome physical exam findings include dry mucous membranes. " The initial radiographic and laboratory data are worrisome because of COVID-19 positive and UTI. " The chronic co-morbidities include diabetes hypertension.   * I certify that at the point of admission it is my clinical judgment that the patient will require inpatient hospital care spanning beyond 2 midnights from the point of admission due to high intensity of service, high risk for further deterioration and high frequency of surveillance required.Barbette Merino MD [facility] Pager [phone]  If 7PM-7AM, please contact night-coverage [email or web address] Password [email or web address]  04/11/2021, 8:16 PM

## 2021-04-11 NOTE — Progress Notes (Signed)
Pharmacy Antibiotic Note  Margaret Hess is a 55 y.o. female admitted on 04/11/2021 with sepsis.  Pharmacy has been consulted for cefepime dosing.  Today, 04/11/21 WBC elevated SCr 1.95, CrCl ~39 mL/min  Plan: Cefepime 2 g IV q12h Follow renal function  Height: 5\' 5"  (165.1 cm) Weight: 104 kg (229 lb 4.5 oz) IBW/kg (Calculated) : 57  Temp (24hrs), Avg:98 F (36.7 C), Min:97.9 F (36.6 C), Max:98 F (36.7 C)  Recent Labs  Lab 04/11/21 1423  WBC 22.3*  CREATININE 1.95*  LATICACIDVEN 1.8    Estimated Creatinine Clearance: 39.5 mL/min (A) (by C-G formula based on SCr of 1.95 mg/dL (H)).    Allergies  Allergen Reactions   Penicillins Rash and Other (See Comments)    Tolerates cephalosporins    Antimicrobials this admission: cefepime 8/10 >>   Dose adjustments this admission:  Microbiology results: 8/10 BCx:   Thank you for allowing pharmacy to be a part of this patient's care.  06/11/21, PharmD 04/11/2021 8:19 PM

## 2021-04-11 NOTE — Sepsis Progress Note (Signed)
Elink monitoring code sepsis 

## 2021-04-11 NOTE — Progress Notes (Signed)
The patient was admitted to the floor from [facility], Report received from [facility]. Thepatient arrived at 50. She is Alert and Oriented x4, VSS, on room air. She was transferred to the inpatient bed and hooked up to the primafit.

## 2021-04-11 NOTE — ED Provider Notes (Signed)
[facility] EMERGENCY DEPARTMENT Provider Note   CSN: [id number] Arrival date & time: 04/11/21  1300     History Chief Complaint  Patient presents with   Weakness    Margaret Hess is a 55 y.o. female  who presents with a cc of weakness. 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  Patient is recently diagnosed with COVID 19 virus infection.  Her symptoms began Saturday, 5 days ago.  The patient was at the infusion clinic today to get monoclonal antibodies and they were unable to start a line.  Upon arrival patient was noted to be tachycardic and hypotensive.  She reports persistent nausea, 1 episode of vomiting, poor oral intake.  She denies any shortness of breath or chest pain but does have some cough.  Patient feels weak and has generalized malaise.  She denies abdominal pain and has no other complaints at this time.  Patient is not on a blood thinner and denies melena or hematochezia.   Weakness     Past Medical History:  Diagnosis Date   Acute respiratory failure with hypoxia ([place]) 06/16/2012   Anxiety 06/15/2012   Bipolar disorder, unspecified ([place]) 08/29/2014   Cerebral embolism with cerebral infarction ([place]) 06/20/2012   Formatting of this note might be different from the original. Last Assessment & Plan:  No signs of bleeding, continue coumadin.   Cerebral infarction [facility]) 06/26/2012   Formatting of this note might be different from the original. Last Assessment & Plan:  Continue speech therapy and will restart PT once speech finished.   CHF (congestive heart failure) (HCC)    Chronic combined systolic (congestive) and diastolic (congestive) heart failure ([place]) 06/16/2012   She was seen in the emergency room after [facility] 11/16/2018 with decompensated heart failure.  She was last seen in the heart failure program 03/06/2016 and at that time her ejection fraction was 45 to  50%.  It was not reassessed however her BNP level was significantly elevated from baseline at 724.  Formatting of this note might be different from the original. Last Assessment & Plan:    Chronic systolic heart failure ([place]) 06/15/2012   Formatting of this note might be different from the original. Last Assessment & Plan:  NYHA II. Volume status stable. Continue lasix as needed. Stop digoxin. Cut lisinopril back to 5 mg at bed time. Follow up in 3 months with an ECHO.   Patient seen and examined with Darrick Grinder, NP. We discussed all aspects of the encounter. I agree with the assessment and plan as stated above. She is much improved   Diabetes mellitus    Diabetes mellitus ([place]) 06/16/2012   Dizziness 02/02/2014   Fatigue 02/02/2014   History of cerebral embolism 06/20/2012   Formatting of this note might be different from the original. Overview:  Last Assessment & Plan:  No signs of bleeding, continue coumadin.   HTN (hypertension) 06/15/2012   Formatting of this note might be different from the original. Last Assessment & Plan:  Controlled but as above will increase lisinopril 5 mg for afterload reduction.  Last Assessment & Plan:  Controlled but as above will increase lisinopril 5 mg for afterload reduction.   Hypertension    Hypertensive heart disease 06/15/2012   Formatting of this note might be different from the original. Last Assessment & Plan:  Controlled but as above will increase lisinopril 5 mg for afterload reduction.  Long term (current) use of anticoagulants 11/11/2012   Lower urinary tract infectious disease 02/02/2014   Formatting of this note might be different from the original. IMO Problem List Replacer Jan. 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 Last Assessment & Plan:  Will continue coumadin until EF has recovered.  The patient has expressed interest in NOAC agents but there is no indication for NOAC  agents in patients with LV thrombus.  Have discussed with the patient and she voices understanding.  Repeat echo in 2 months.   Nonischemic cardiomyopathy ([place]) 06/17/2012   Formatting of this note might be different from the original. Last Assessment & Plan:  EF 35-40% but patient exhibits no signs of HF.  Will continue to titrate HF meds with lisinopril 5 mg BID.  Will follow up in 1 month in hopes to titrate carvedilol.  Will repeat echo in 2 months.  Continue sliding scale lasix.    Last Assessment & Plan:  EF 35-40% but patient exhibits no signs of HF.  Will cont   Palpitations 08/20/2012   Formatting of this note might be different from the original. Last Assessment & Plan:  Daily palpitations. Place Holter monitor for 48 hours.  Attending: I am concerned she may be having NSVT. However she did not have much ectopy while in the hospital. Will place 48-hour holter. May need to consider ICD if EF not improving.   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 Last Assessment & Plan:  No signs of bleeding, continue coumadin.  Last Assessment & Plan:  No signs of bleeding, continue coumadin.    Patient Active Problem List   Diagnosis Date Noted   Sepsis ([place]) 04/11/2021   Sepsis due to urinary tract infection ([place]) 04/11/2021   COVID-19 virus infection 04/11/2021   AKI (acute kidney injury) ([place]) 04/11/2021   GI bleed 10/08/2020   Bipolar disorder ([place]) 08/29/2014   Dizziness 02/02/2014   Fatigue 02/02/2014   Lower urinary tract infectious disease 02/02/2014   Long term (current) use of anticoagulants 11/11/2012   Palpitations 08/20/2012   Cerebral infarction ([place]) 75/91/6384   Stroke, embolic ([place]) 66/59/9357   History of cerebral embolism 06/20/2012   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    Nonischemic cardiomyopathy ([place]) 06/17/2012   Acute respiratory failure with hypoxia ([place]) 06/16/2012   Positive D dimer 06/16/2012   Diabetes mellitus ([place]) 06/16/2012   Pulmonary hypertension ([place]) 06/16/2012   Chronic combined systolic (congestive) and diastolic (congestive) heart failure ([place]) 06/16/2012   [place]ural thrombus of heart 01/77/9390   Chronic systolic heart failure ([place]) 06/15/2012   Hypertensive heart disease 06/15/2012   Anxiety 06/15/2012   SOB (shortness of breath) 06/15/2012   HTN (hypertension) 06/15/2012    Past Surgical History:  Procedure Laterality Date   LEFT AND RIGHT HEART CATHETERIZATION WITH CORONARY ANGIOGRAM N/A 06/16/2012   Procedure: LEFT AND RIGHT HEART CATHETERIZATION WITH CORONARY ANGIOGRAM;  Surgeon: Jolaine Artist, MD;  [facility];  Service: Cardiovascular;  Laterality: N/A;   WISDOM TOOTH EXTRACTION       OB History   No obstetric history on file.     Family History  Problem Relation Age of Onset  Multiple myeloma Mother    Heart disease Father    Hypertension Father    Stroke Maternal Grandmother     Social History   Tobacco Use   Smoking status: Never   Smokeless tobacco: Never  Vaping Use   Vaping Use: Never used  Substance Use Topics   Alcohol use: Yes    Comment: 1 mixed drink every 6 months   Drug use: No    Home Medications Prior to Admission medications   Medication Sig Start Date End Date Taking? Authorizing Provider  acetaminophen (TYLENOL) 500 MG tablet Take 1,000 mg by mouth every 6 (six) hours as needed for pain.    Yes [provider]  ALPRAZolam Duanne Moron) 0.5 MG tablet Take 0.5 mg by mouth 2 (two) times daily as needed for anxiety. 11/01/19  Yes [provider]  ARIPiprazole (ABILIFY) 2 MG tablet Take 2 mg by mouth at bedtime. 11/01/19  Yes [provider]  aspirin EC 81 MG tablet Take 1 tablet (81 mg total) by mouth daily. 03/25/14  Yes Larey Dresser, MD  carvedilol  (COREG) 3.125 MG tablet Take 1 tablet (3.125 mg total) by mouth 2 (two) times daily with a meal. Please call for OV [phone] 03/04/18  Yes Bensimhon, Shaune Pascal, MD  citalopram (CELEXA) 20 MG tablet Take 40 mg by mouth every morning.    Yes [provider]  clobetasol cream (TEMOVATE) 0.05 % APPLY SMALL AMOUNT AA BID 04/14/19  Yes [provider]  DULoxetine (CYMBALTA) 60 MG capsule Take 60 mg by mouth 2 (two) times daily. 11/01/19  Yes [provider]  ENTRESTO 24-26 MG TAKE 1 TABLET BY MOUTH TWICE DAILY Patient taking differently: Take 1 tablet by mouth 2 (two) times daily. 03/02/20  Yes Richardo Priest, MD  furosemide (LASIX) 20 MG tablet Take 20 mg by mouth daily. 11/09/19  Yes [provider]  gabapentin (NEURONTIN) 800 MG tablet Take 800 mg by mouth in the morning, at noon, and at bedtime. 10/11/19  Yes [provider]  glipiZIDE (GLUCOTROL) 10 MG tablet Take 10 mg by mouth daily. 03/19/21  Yes [provider]  HYDROcodone-acetaminophen (NORCO/VICODIN) 5-325 MG tablet Take 1 tablet by mouth every 8 (eight) hours as needed for pain. 01/03/20  Yes [provider]  JARDIANCE 10 MG TABS tablet Take 10 mg by mouth daily. 04/09/21  Yes [provider]  LANTUS SOLOSTAR 100 UNIT/ML Solostar Pen Inject 20 Units into the skin at bedtime. 01/31/21  Yes [provider]  metFORMIN (GLUCOPHAGE-XR) 500 MG 24 hr tablet Take 1,000 mg by mouth 2 (two) times daily. 01/03/20  Yes [provider]  mirabegron ER (MYRBETRIQ) 50 MG TB24 tablet Take 50 mg by mouth daily. 11/09/19  Yes [provider]  naloxone (NARCAN) nasal spray 4 mg/0.1 mL For suspected opioid overdose, spray 1 mL in each nostril.  Repeat after 3 minutes if no or minimal response. 02/02/21  Yes [provider]  omeprazole (PRILOSEC) 40 MG capsule Take 40 mg by mouth daily. 04/11/21  Yes [provider]  ondansetron (ZOFRAN-ODT) 4 MG disintegrating tablet  Take 4 mg by mouth as needed for nausea/vomiting. 04/09/21  Yes [provider]  ONE TOUCH ULTRA TEST test strip 1 each by Other route as needed for other. 03/25/13  Yes [provider]  TOVIAZ 4 MG TB24 tablet Take 4 mg by mouth daily. 04/05/21  Yes [provider]  nitrofurantoin, macrocrystal-monohydrate, (MACROBID) 100 MG capsule Take 1 capsule (  100 mg total) by mouth 2 (two) times daily for 5 days. 04/14/21 04/19/21  Shawna Clamp, MD    Allergies    Penicillins  Review of Systems   Review of Systems Ten systems reviewed and are negative for acute change, except as noted in the HPI.  Physical Exam Updated Vital Signs BP 133/87 (BP Location: Right Arm)   Pulse 93   Temp 98.7 F (37.1 C) (Oral)   Resp 18   Ht _0  (1.651 m)   Wt 72.8 kg   LMP 02/01/2016   SpO2 97%   BMI 26.71 kg/m   Physical Exam Vitals and nursing note reviewed.  Constitutional:      General: She is not in acute distress.    Appearance: She is well-developed. She is ill-appearing. She is not toxic-appearing or diaphoretic.  HENT:     Head: Normocephalic and atraumatic.     Right Ear: External ear normal.     Left Ear: External ear normal.     Nose: Nose normal.     Mouth/Throat:     Mouth: Mucous membranes are moist.  Eyes:     General: No scleral icterus.    Conjunctiva/sclera: Conjunctivae normal.  Cardiovascular:     Rate and Rhythm: Normal rate and regular rhythm.     Heart sounds: Normal heart sounds. No murmur heard.   No friction rub. No gallop.  Pulmonary:     Effort: Pulmonary effort is normal. No respiratory distress.     Breath sounds: Normal breath sounds.  Abdominal:     General: Bowel sounds are normal. There is no distension.     Palpations: Abdomen is soft. There is no mass.     Tenderness: There is no abdominal tenderness. There is no guarding.  Musculoskeletal:     Cervical back: Normal range of motion.  Skin:    General: Skin is warm and dry.   Neurological:     Mental Status: She is alert and oriented to person, place, and time.  Psychiatric:        Behavior: Behavior normal.     ED Results / Procedures / Treatments   Labs (all labs ordered are listed, but only abnormal results are displayed) Labs Reviewed  RESP PANEL BY RT-PCR (FLU A&B, COVID) ARPGX2 - Abnormal; Notable for the following components:      Result Value   SARS Coronavirus 2 by RT PCR POSITIVE (*)    All other components within normal limits  URINE CULTURE - Abnormal; Notable for the following components:   Culture   (*)    Value: <10,000 COLONIES/mL INSIGNIFICANT GROWTH Performed at [facility] [address], [place], [place] [address]    All other components within normal limits  COMPREHENSIVE METABOLIC PANEL - Abnormal; Notable for the following components:   Sodium 127 (*)    Chloride 93 (*)    CO2 20 (*)    Glucose, Bld 166 (*)    BUN 56 (*)    Creatinine, Ser 1.95 (*)    Calcium 7.9 (*)    Albumin 3.3 (*)    GFR, Estimated 30 (*)    All other components within normal limits  CBC WITH DIFFERENTIAL/PLATELET - Abnormal; Notable for the following components:   WBC 22.3 (*)    Neutro Abs 17.3 (*)    Monocytes Absolute 2.1 (*)    Abs Immature Granulocytes 0.14 (*)    All other components within normal limits  URINALYSIS, ROUTINE W REFLEX  MICROSCOPIC - Abnormal; Notable for the following components:   APPearance CLOUDY (*)    Glucose, UA >=500 (*)    Protein, ur 30 (*)    Leukocytes,Ua SMALL (*)    All other components within normal limits  URINALYSIS, MICROSCOPIC (REFLEX) - Abnormal; Notable for the following components:   Bacteria, UA MANY (*)    All other components within normal limits  CBC - Abnormal; Notable for the following components:   WBC 13.2 (*)    RBC 3.53 (*)    Hemoglobin 9.9 (*)    HCT 31.2 (*)    All other components within normal limits  CREATININE, SERUM - Abnormal; Notable for the following components:    Creatinine, Ser 1.01 (*)    All other components within normal limits  COMPREHENSIVE METABOLIC PANEL - Abnormal; Notable for the following components:   Sodium 133 (*)    CO2 20 (*)    Glucose, Bld 105 (*)    BUN 33 (*)    Creatinine, Ser 1.01 (*)    Calcium 7.4 (*)    Total Protein 6.2 (*)    Albumin 2.5 (*)    All other components within normal limits  CBC - Abnormal; Notable for the following components:   WBC 12.9 (*)    RBC 3.56 (*)    Hemoglobin 10.0 (*)    HCT 31.7 (*)    All other components within normal limits  GLUCOSE, CAPILLARY - Abnormal; Notable for the following components:   Glucose-Capillary 55 (*)    All other components within normal limits  HEMOGLOBIN A1C - Abnormal; Notable for the following components:   Hgb A1c MFr Bld 6.5 (*)    All other components within normal limits  C-REACTIVE PROTEIN - Abnormal; Notable for the following components:   CRP 26.3 (*)    All other components within normal limits  GLUCOSE, CAPILLARY - Abnormal; Notable for the following components:   Glucose-Capillary 63 (*)    All other components within normal limits  GLUCOSE, CAPILLARY - Abnormal; Notable for the following components:   Glucose-Capillary 69 (*)    All other components within normal limits  GLUCOSE, CAPILLARY - Abnormal; Notable for the following components:   Glucose-Capillary 147 (*)    All other components within normal limits  GLUCOSE, CAPILLARY - Abnormal; Notable for the following components:   Glucose-Capillary 155 (*)    All other components within normal limits  CBC - Abnormal; Notable for the following components:   RBC 3.60 (*)    Hemoglobin 10.0 (*)    HCT 32.1 (*)    All other components within normal limits  COMPREHENSIVE METABOLIC PANEL - Abnormal; Notable for the following components:   CO2 21 (*)    Glucose, Bld 145 (*)    BUN 22 (*)    Calcium 8.2 (*)    Total Protein 6.1 (*)    Albumin 2.3 (*)    AST 13 (*)    All other components  within normal limits  GLUCOSE, CAPILLARY - Abnormal; Notable for the following components:   Glucose-Capillary 153 (*)    All other components within normal limits  GLUCOSE, CAPILLARY - Abnormal; Notable for the following components:   Glucose-Capillary 123 (*)    All other components within normal limits  GLUCOSE, CAPILLARY - Abnormal; Notable for the following components:   Glucose-Capillary 131 (*)    All other components within normal limits  URINALYSIS, ROUTINE W REFLEX MICROSCOPIC - Abnormal; Notable for the following  components:   APPearance HAZY (*)    Glucose, UA >=500 (*)    Hgb urine dipstick SMALL (*)    Leukocytes,Ua LARGE (*)    WBC, UA >50 (*)    Bacteria, UA FEW (*)    Non Squamous Epithelial 0-5 (*)    All other components within normal limits  GLUCOSE, CAPILLARY - Abnormal; Notable for the following components:   Glucose-Capillary 137 (*)    All other components within normal limits  GLUCOSE, CAPILLARY - Abnormal; Notable for the following components:   Glucose-Capillary 156 (*)    All other components within normal limits  CBC - Abnormal; Notable for the following components:   WBC 10.7 (*)    All other components within normal limits  COMPREHENSIVE METABOLIC PANEL - Abnormal; Notable for the following components:   Sodium 134 (*)    CO2 20 (*)    Glucose, Bld 151 (*)    Calcium 8.5 (*)    Albumin 2.6 (*)    AST 13 (*)    All other components within normal limits  GLUCOSE, CAPILLARY - Abnormal; Notable for the following components:   Glucose-Capillary 160 (*)    All other components within normal limits  GLUCOSE, CAPILLARY - Abnormal; Notable for the following components:   Glucose-Capillary 136 (*)    All other components within normal limits  GLUCOSE, CAPILLARY - Abnormal; Notable for the following components:   Glucose-Capillary 128 (*)    All other components within normal limits  GLUCOSE, CAPILLARY - Abnormal; Notable for the following components:    Glucose-Capillary 151 (*)    All other components within normal limits  CULTURE, BLOOD (SINGLE)  URINE CULTURE  LACTIC ACID, PLASMA  PROTIME-INR  APTT  PREGNANCY, URINE  HIV ANTIBODY (ROUTINE TESTING W REFLEX)  PROTIME-INR  CORTISOL-AM, BLOOD  PROCALCITONIN  GLUCOSE, CAPILLARY  GLUCOSE, CAPILLARY  GLUCOSE, CAPILLARY    EKG EKG Interpretation  Date/Time:  Wednesday April 11 2021 14:05:37 EDT Ventricular Rate:  103 PR Interval:  140 QRS Duration: 89 QT Interval:  412 QTC Calculation: 540 R Axis:   2 Text Interpretation: Sinus tachycardia Atrial premature complex Abnormal R-wave progression, early transition Probable LVH with secondary repol abnrm ST depr, consider ischemia, inferior leads Prolonged QT interval Confirmed by Gerlene Fee [id number]) on 04/13/2021 1:08:52 PM  Radiology No results found.  Procedures .Critical Care  Date/Time: 04/15/2021 9:18 AM Performed by: Margarita Mail, PA-C Authorized by: Margarita Mail, PA-C   Critical care provider statement:    Critical care time (minutes):  50   Critical care time was exclusive of:  Separately billable procedures and treating other patients   Critical care was necessary to treat or prevent imminent or life-threatening deterioration of the following conditions:  Sepsis   Critical care was time spent personally by me on the following activities:  Discussions with consultants, evaluation of patient's response to treatment, examination of patient, ordering and performing treatments and interventions, ordering and review of laboratory studies, ordering and review of radiographic studies, pulse oximetry, re-evaluation of patient's condition, obtaining history from patient or surrogate and review of old charts   Medications Ordered in ED Medications  sodium chloride 0.9 % bolus 500 mL (0 mLs Intravenous Stopped 04/11/21 1420)  sodium chloride 0.9 % bolus 500 mL (500 mLs Intravenous New Bag/Given 04/11/21 1421)  sodium  chloride 0.9 % bolus 1,000 mL (1,000 mLs Intravenous New Bag/Given 04/11/21 1643)  sodium chloride 0.9 % bolus 1,200 mL (1,200 mLs Intravenous New Bag/Given 04/11/21  1738)  remdesivir 200 mg in sodium chloride 0.9% 250 mL IVPB (200 mg Intravenous New Bag/Given 04/12/21 1404)    Followed by  remdesivir 100 mg in sodium chloride 0.9 % 100 mL IVPB (100 mg Intravenous New Bag/Given 04/14/21 1030)    ED Course  I have reviewed the triage vital signs and the nursing notes.  Pertinent labs & imaging results that were available during my care of the patient were reviewed by me and considered in my medical decision making (see chart for details).     CC: weakness [id number] is gathered by patient and emr. Previous records obtained and reviewed. DDX:The patient's complaint of weakness involves an extensive number of diagnostic and treatment options, and is a complaint that carries with it a high risk of complications, morbidity, and potential mortality. Given the large differential diagnosis, medical decision making is of high complexity. The differential diagnosis of weakness includes but is not limited to neurologic causes (GBS, myasthenia gravis, CVA, MS, ALS, transverse myelitis, spinal cord injury, CVA, botulism, ) and other causes: ACS, Arrhythmia, syncope, orthostatic hypotension, sepsis, hypoglycemia, electrolyte disturbance, hypothyroidism, respiratory failure, symptomatic anemia, dehydration, heat injury, polypharmacy, malignancy.  Labs: I ordered reviewed and interpreted labs which include  CBC- leukocytosis CMP-shows AKI, hyponatremia, hypocalcemia, elevated glucose Lactate- wnl Pregnancy-negative UA-showsinfection PT/INR/APTT- wnl  Imaging: I ordered and reviewed images which included 1 v cxr. I independently visualized and interpreted all imaging. There are no acute, significant findings on today's images. EKG: Sinus tach with a rate of 103 Consults: NLG:XQJJHER here with apparent  urosepsis in the setting of active COVID 19 infection. Patient receiving fluids, antibiotics with improvement in hemodynamics. Patient will be admissted to the hospital. Patient disposition:The patient appears reasonably stabilized for admission considering the current resources, flow, and capabilities available in the ED at this time, and I doubt any other [facility] requiring further screening and/or treatment in the ED prior to admission.          MDM Rules/Calculators/A&P                            Final Clinical Impression(s) / ED Diagnoses Final diagnoses:  Sepsis due to urinary tract infection ([place])  AKI (acute kidney injury) ([place])  COVID-19 virus infection    Rx / DC Orders ED Discharge Orders          Ordered    nitrofurantoin, macrocrystal-monohydrate, (MACROBID) 100 MG capsule  2 times daily        04/14/21 1054    Increase activity slowly        04/14/21 1055    Diet - low sodium heart healthy        04/14/21 1055    Discharge instructions       Comments: Advised to follow-up with primary care physician in 1 week. Advised to take nitrofurantoin twice daily for next 5 days for UTI. Advised to remain in quarantine for next 4 days to complete 10-day quarantine.   04/14/21 1055    Diet Carb Modified        04/14/21 1055    Call MD for:  persistant nausea and vomiting        04/14/21 1055    Call MD for:  difficulty breathing, headache or visual disturbances        04/14/21 1055    Call MD for:  persistant dizziness or light-headedness        04/14/21 1055  Margarita Mail, PA-C 04/15/21 0919    Godfrey Pick, MD 04/19/21 [phone]

## 2021-04-12 LAB — GLUCOSE, CAPILLARY
Glucose-Capillary: 63 mg/dL — ABNORMAL LOW (ref 70–99)
Glucose-Capillary: 69 mg/dL — ABNORMAL LOW (ref 70–99)
Glucose-Capillary: 147 mg/dL — ABNORMAL HIGH (ref 70–99)
Glucose-Capillary: 155 mg/dL — ABNORMAL HIGH (ref 70–99)
Glucose-Capillary: 90 mg/dL (ref 70–99)
Glucose-Capillary: 153 mg/dL — ABNORMAL HIGH (ref 70–99)
Glucose-Capillary: 123 mg/dL — ABNORMAL HIGH (ref 70–99)

## 2021-04-12 LAB — C-REACTIVE PROTEIN: CRP: 26.3 mg/dL — ABNORMAL HIGH (ref <1.0)

## 2021-04-12 LAB — COMPREHENSIVE METABOLIC PANEL
ALT: 12 U/L (ref 0–44)
AST: 16 U/L (ref 15–41)
Albumin: 2.5 g/dL — ABNORMAL LOW (ref 3.5–5.0)
Alkaline Phosphatase: 44 U/L (ref 38–126)
Anion gap: 7 (ref 5–15)
BUN: 33 mg/dL — ABNORMAL HIGH (ref 6–20)
CO2: 20 mmol/L — ABNORMAL LOW (ref 22–32)
Calcium: 7.4 mg/dL — ABNORMAL LOW (ref 8.9–10.3)
Chloride: 106 mmol/L (ref 98–111)
Creatinine, Ser: 1.01 mg/dL — ABNORMAL HIGH (ref 0.44–1.00)
GFR, Estimated: >60 mL/min (ref >60)
Glucose, Bld: 105 mg/dL — ABNORMAL HIGH (ref 70–99)
Potassium: 3.6 mmol/L (ref 3.5–5.1)
Sodium: 133 mmol/L — ABNORMAL LOW (ref 135–145)
Total Bilirubin: 0.3 mg/dL (ref 0.3–1.2)
Total Protein: 6.2 g/dL — ABNORMAL LOW (ref 6.5–8.1)

## 2021-04-12 LAB — CBC
HCT: 31.2 % — ABNORMAL LOW (ref 36.0–46.0)
HCT: 31.7 % — ABNORMAL LOW (ref 36.0–46.0)
Hemoglobin: 10 g/dL — ABNORMAL LOW (ref 12.0–15.0)
Hemoglobin: 9.9 g/dL — ABNORMAL LOW (ref 12.0–15.0)
MCH: 28 pg (ref 26.0–34.0)
MCH: 28.1 pg (ref 26.0–34.0)
MCHC: 31.5 g/dL (ref 30.0–36.0)
MCHC: 31.7 g/dL (ref 30.0–36.0)
MCV: 88.4 fL (ref 80.0–100.0)
MCV: 89 fL (ref 80.0–100.0)
Platelets: 264 10*3/uL (ref 150–400)
Platelets: 268 10*3/uL (ref 150–400)
RBC: 3.53 MIL/uL — ABNORMAL LOW (ref 3.87–5.11)
RBC: 3.56 MIL/uL — ABNORMAL LOW (ref 3.87–5.11)
RDW: 14.8 % (ref 11.5–15.5)
RDW: 14.9 % (ref 11.5–15.5)
WBC: 12.9 10*3/uL — ABNORMAL HIGH (ref 4.0–10.5)
WBC: 13.2 10*3/uL — ABNORMAL HIGH (ref 4.0–10.5)
nRBC: 0 % (ref 0.0–0.2)
nRBC: 0 % (ref 0.0–0.2)

## 2021-04-12 LAB — HEMOGLOBIN A1C
Hgb A1c MFr Bld: 6.5 % — ABNORMAL HIGH (ref 4.8–5.6)
Mean Plasma Glucose: 139.85 mg/dL

## 2021-04-12 LAB — RESP PANEL BY RT-PCR (FLU A&B, COVID) ARPGX2
Influenza A by PCR: NEGATIVE
Influenza B by PCR: NEGATIVE
SARS Coronavirus 2 by RT PCR: POSITIVE — AB

## 2021-04-12 LAB — CORTISOL-AM, BLOOD: Cortisol - AM: 9.1 ug/dL (ref 6.7–22.6)

## 2021-04-12 LAB — CREATININE, SERUM
Creatinine, Ser: 1.01 mg/dL — ABNORMAL HIGH (ref 0.44–1.00)
GFR, Estimated: >60 mL/min (ref >60)

## 2021-04-12 LAB — PROCALCITONIN: Procalcitonin: 1.44 ng/mL

## 2021-04-12 LAB — HIV ANTIBODY (ROUTINE TESTING W REFLEX): HIV Screen 4th Generation wRfx: NONREACTIVE

## 2021-04-12 LAB — PROTIME-INR
INR: 1 (ref 0.8–1.2)
Prothrombin Time: 13.3 seconds (ref 11.4–15.2)

## 2021-04-12 MED ORDER — SODIUM CHLORIDE 0.9 % IV SOLN
2.0000 g | Freq: Three times a day (TID) | INTRAVENOUS | Status: DC
  Administered 2021-04-12 – 2021-04-14 (×6): 2 g via INTRAVENOUS
  Filled 2021-04-12 (×7): qty 2

## 2021-04-12 MED ORDER — SALINE SPRAY 0.65 % NA SOLN
2.0000 | NASAL | Status: DC | PRN
  Filled 2021-04-12: qty 44

## 2021-04-12 MED ORDER — SODIUM CHLORIDE 0.9 % IV SOLN: 100.0000 mg | Freq: Every day | INTRAVENOUS | Status: DC

## 2021-04-12 MED ORDER — SODIUM CHLORIDE 0.9 % IV SOLN
200.0000 mg | Freq: Once | INTRAVENOUS | Status: AC
  Administered 2021-04-12: 200 mg via INTRAVENOUS
  Filled 2021-04-12: qty 40

## 2021-04-12 MED ORDER — SODIUM CHLORIDE 0.9 % IV SOLN
100.0000 mg | Freq: Every day | INTRAVENOUS | Status: AC
  Administered 2021-04-13 – 2021-04-14 (×2): 100 mg via INTRAVENOUS
  Filled 2021-04-12 (×2): qty 20

## 2021-04-12 MED ORDER — INSULIN ASPART 100 UNIT/ML IJ SOLN
0.0000 [IU] | INTRAMUSCULAR | Status: DC
  Administered 2021-04-12 – 2021-04-14 (×5): 1 [IU] via SUBCUTANEOUS

## 2021-04-12 NOTE — Plan of Care (Signed)
  Problem: Respiratory: Goal: Will maintain a patent airway Outcome: Progressing Goal: Complications related to the disease process, condition or treatment will be avoided or minimized Outcome: Progressing   Problem: Urinary Elimination: Goal: Signs and symptoms of infection will decrease Outcome: Progressing    Problem: Elimination: Goal: Will not experience complications related to urinary retention Outcome: Progressing   Problem: Pain Managment: Goal: General experience of comfort will improve Outcome: Progressing   Problem: Activity: Goal: Risk for activity intolerance will decrease Outcome: Progressing

## 2021-04-12 NOTE — TOC Progression Note (Signed)
Transition of Care [facility]) - Progression Note    Patient Details  Name: Margaret Hess MRN: [id number] Date of Birth: 1966-04-07  Transition of Care [facility]) CM/SW Contact  Geni Bers, RN Phone Number: 04/12/2021, 3:35 PM  Clinical Narrative:     Pt from home with spouse and plan on returning home.   Expected Discharge Plan: Home/Self Care Barriers to Discharge: No Barriers Identified  Expected Discharge Plan and Services Expected Discharge Plan: Home/Self Care       Living arrangements for the past 2 months: Single Family Home                                       Social Determinants of Health (SDOH) Interventions    Readmission Risk Interventions No flowsheet data found.

## 2021-04-12 NOTE — Progress Notes (Signed)
Hypoglycemic Event  CBG: 63  Treatment: 4 oz apple juice and had breakfast meal  Symptoms: Shaky  Follow-up CBG: Time:0927 CBG Result:69  Possible Reasons for Event: NPO, pt in process of eatting breakfast at 0927    Comments/MD notified:on unit rounding and updated, adjusted insulin orders.    Haynes Dage, Jeryn Cerney R

## 2021-04-12 NOTE — Progress Notes (Signed)
C/O dry nasal passage/nostrils Ocean spray Saline drops ordered, per standing orders

## 2021-04-12 NOTE — Progress Notes (Signed)
PROGRESS NOTE    Margaret Hess  VQM:[id number] DOB: 01-04-1966 DOA: 04/11/2021 PCP: Burnis Medin, PA-C   Chief Complaint  Patient presents with   Weakness   Brief Narrative: [phone] f w/ cva left hemiparesis, exp aphasia, chf, recent covid diagnosed Saturday bipolar disorder/anxiety who has been having nausea and had episode of vomiting, poor oral intake but no shortness of breath or chest pain feeling weak and generalized malaise, no abdominal pain diarrhea . She was at infusion center  for monoclonal antibodies but unable to get IV access.  She was sent to the ED on arrival was tachycardic, hypotensive in 72/54, saturating 95% on room air with elevated WBC count 22.3, hyponatremia AKI and need consistent with UTI code sepsis was initiated chest x-ray no acute finding.  Fluids and antibiotics were initiated patient is stabilized blood culture urine culture sent and admitted   Subjective: Seen and examined this morning. Hypoglycemic  this am.  Assessment & Plan:  Sepsis due to urinary tract infection POA: Blood pressure is currently stable.  Leukocytosis downtrending.  Pro-Cal was up.  Continue cefepime and follow-up urine and Blood culture Recent Labs  Lab 04/11/21 1423 04/12/21 0402  WBC 22.3* 13.2*  12.9*  LATICACIDVEN 1.8  --   PROCALCITON  --  1.44    COVID-19 positive no respiratory symptoms.  Tested positive last Saturday.  On contact airborne precaution. Checked CRP and is up- we will start on remdesivir x 3 days, no hypoxia, no need for steroid.  Chronic systolic CHF with EF 40%: Decrease IV fluid rate, loss of fluid overload  Leukocytosis, afebrile and normal lactic acid.  WBC downtrending  Hyponatremia Mild metabolic acidosis bicarb 20 AKI ( no CKD):  Due to poor oral intake.  Continue on IVF hydration gently,watching for fluid overload Recent Labs    10/08/20 0408 10/08/20 1423 10/17/20 1224 04/11/21 1423 04/12/21 0402  BUN 12 14 14  56* 33*   CREATININE 0.99 0.71 0.83 1.95* 1.01*  1.01*    Diabetes mellitus, with hypoglycemia 55.  Hold insulin AND oha, monitor on low sensitive scale sliding insulin.  Change to regular diet Recent Labs  Lab 04/11/21 2228 04/12/21 0859 04/12/21 0927 04/12/21 1027 04/12/21 1201  GLUCAP 73 63* 69* 147* 155*   Essential hypertension holding diuretics and Entresto.  cont carvedilol  History of stroke with left-sided weakness chronic and stable continue home ASA.  Morbid obesity BMI 38: Will benefit with weight loss/healthy lifestyle and PCP follow-up.    Diet Order             Diet regular Room service appropriate? Yes; Fluid consistency: Thin  Diet effective now                 DVT prophylaxis: heparin injection 5,000 Units Start: 04/11/21 2200 Code Status:   Code Status: Full Code  Family Communication: plan of care discussed with patient at bedside. Status is: Inpatient  Remains inpatient appropriate because:IV treatments appropriate due to intensity of illness or inability to take PO and Inpatient level of care appropriate due to severity of illness  Dispo: The patient is from: Home              Anticipated d/c is to: Home              Patient currently is not medically stable to d/c.   Difficult to place patient No  Unresulted Labs (From admission, onward)     Start     Ordered  04/13/21 0500  CBC  Daily,   R      04/12/21 0816   04/13/21 0500  Comprehensive metabolic panel  Daily,   R      04/12/21 0816   04/11/21 1539  Culture, blood (single)  (Undifferentiated -> Now sepsis confirmed (treatment and sepsis specific nursing orders))  ONCE - STAT,   STAT        04/11/21 1540           Medications reviewed:  Scheduled Meds:  ARIPiprazole  2 mg Oral Daily   aspirin EC  81 mg Oral Daily   carvedilol  3.125 mg Oral BID WC   citalopram  40 mg Oral BH-q7a   clobetasol cream   Topical BID   DULoxetine  60 mg Oral BID   fesoterodine  4 mg Oral Daily   gabapentin   800 mg Oral TID   heparin  5,000 Units Subcutaneous Q8H   insulin aspart  0-6 Units Subcutaneous Q4H   mirabegron ER  50 mg Oral Daily   pantoprazole  40 mg Oral Daily   Continuous Infusions:  sodium chloride 100 mL/hr at 04/12/21 0731   ceFEPime (MAXIPIME) IV 2 g (04/12/21 1258)   remdesivir 200 mg in sodium chloride 0.9% 250 mL IVPB     Followed by   Melene Muller ON 04/13/2021] remdesivir 100 mg in NS 100 mL     Consultants:see note  Procedures:see note Antimicrobials: Anti-infectives (From admission, onward)    Start     Dose/Rate Route Frequency Ordered Stop   04/13/21 1000  remdesivir 100 mg in sodium chloride 0.9 % 100 mL IVPB       See Hyperspace for full Linked Orders Report.   100 mg 200 mL/hr over 30 Minutes Intravenous Daily 04/12/21 1203 04/17/21 0959   04/12/21 1400  ceFEPIme (MAXIPIME) 2 g in sodium chloride 0.9 % 100 mL IVPB        2 g 200 mL/hr over 30 Minutes Intravenous Every 8 hours 04/12/21 1207 04/18/21 0559   04/12/21 1245  remdesivir 200 mg in sodium chloride 0.9% 250 mL IVPB       See Hyperspace for full Linked Orders Report.   200 mg 580 mL/hr over 30 Minutes Intravenous Once 04/12/21 1203     04/11/21 2200  ceFEPIme (MAXIPIME) 2 g in sodium chloride 0.9 % 100 mL IVPB  Status:  Discontinued        2 g 200 mL/hr over 30 Minutes Intravenous Every 12 hours 04/11/21 2024 04/12/21 1207   04/11/21 2115  ceFEPIme (MAXIPIME) 2 g in sodium chloride 0.9 % 100 mL IVPB  Status:  Discontinued        2 g 200 mL/hr over 30 Minutes Intravenous  Once 04/11/21 2026 04/11/21 2028   04/11/21 1545  cefTRIAXone (ROCEPHIN) 2 g in sodium chloride 0.9 % 100 mL IVPB  Status:  Discontinued        2 g 200 mL/hr over 30 Minutes Intravenous Every 24 hours 04/11/21 1540 04/11/21 2018      Culture/Microbiology    Component Value Date/Time   SDES  04/11/2021 1420    BLOOD RIGHT ANTECUBITAL Performed at [facility], [address], [place], [place] [address]     [facility]  04/11/2021 1420    BOTTLES DRAWN AEROBIC AND ANAEROBIC Blood Culture adequate volume Performed at [facility], [address], [place], [place] [address]    CULT  04/11/2021 1420  NO GROWTH < 24 HOURS Performed at [facility], [address] [place], [place] [address]    REPTSTATUS PENDING 04/11/2021 1420    Other culture-see note  Objective: Vitals: Today's Vitals   04/12/21 0226 04/12/21 0327 04/12/21 0607 04/12/21 0840  BP: 101/62 101/81 104/60 121/74  Pulse: (!) 108 (!) 106 100 (!) 105  Resp: 18  18 17   Temp: 99 F (37.2 C) 98.4 F (36.9 C) 97.9 F (36.6 C) 99.5 F (37.5 C)  TempSrc: Oral Oral Oral Oral  SpO2: 94% 95% 95% 97%  Weight:      Height:      PainSc:  0-No pain  4     Intake/Output Summary (Last 24 hours) at 04/12/2021 1303 Last data filed at 04/12/2021 1127 Gross per 24 hour  Intake 1570.49 ml  Output 2850 ml  Net -1279.51 ml   Filed Weights   04/11/21 1311  Weight: 104 kg   Weight change:   Intake/Output from previous day: 08/10 0701 - 08/11 0700 In: 1330.5 [P.O.:540; I.V.:690.5; IV Piggyback:100] Out: 2150 [Urine:2150] Intake/Output this shift: Total I/O In: 240 [P.O.:240] Out: 700 [Urine:700] Filed Weights   04/11/21 1311  Weight: 104 kg   Examination:  General exam: AAO x3,older than stated age, weak appearing. HEENT:Oral mucosa moist, Ear/Nose WNL grossly,dentition normal. Respiratory system: bilaterally clear breath sounds, no use of accessory muscle, non tender. Cardiovascular system: S1 & S2 +,No JVD. Gastrointestinal system: Abdomen soft, NT,ND, BS+. Nervous System:Alert, awake, moving extremities w/ chronic weakness on Left sided Extremities: no edema, distal peripheral pulses palpable.  Skin: No rashes,no icterus. MSK: Normal muscle bulk,tone, power Data Reviewed: I have personally reviewed following labs and imaging studies CBC: Recent Labs  Lab 04/11/21 1423 04/12/21 0402  WBC 22.3*  13.2*  12.9*  NEUTROABS 17.3*  --   HGB 12.1 9.9*  10.0*  HCT 36.9 31.2*  31.7*  MCV 85.6 88.4  89.0  PLT 336 264  268   Basic Metabolic Panel: Recent Labs  Lab 04/11/21 1423 04/12/21 0402  NA 127* 133*  K 3.5 3.6  CL 93* 106  CO2 20* 20*  GLUCOSE 166* 105*  BUN 56* 33*  CREATININE 1.95* 1.01*  1.01*  CALCIUM 7.9* 7.4*   GFR: Estimated Creatinine Clearance: 76.2 mL/min (A) (by C-G formula based on SCr of 1.01 mg/dL (H)). Liver Function Tests: Recent Labs  Lab 04/11/21 1423 04/12/21 0402  AST 20 16  ALT 13 12  ALKPHOS 57 44  BILITOT 0.4 0.3  PROT 7.8 6.2*  ALBUMIN 3.3* 2.5*   No results for input(s): LIPASE, AMYLASE in the last 168 hours. No results for input(s): AMMONIA in the last 168 hours. Coagulation Profile: Recent Labs  Lab 04/11/21 1423 04/12/21 0402  INR 1.1 1.0   Cardiac Enzymes: No results for input(s): CKTOTAL, CKMB, CKMBINDEX, TROPONINI in the last 168 hours. BNP (last 3 results) No results for input(s): PROBNP in the last 8760 hours. HbA1C: Recent Labs    04/12/21 0900  HGBA1C 6.5*   CBG: Recent Labs  Lab 04/11/21 2228 04/12/21 0859 04/12/21 0927 04/12/21 1027 04/12/21 1201  GLUCAP 73 63* 69* 147* 155*   Lipid Profile: No results for input(s): CHOL, HDL, LDLCALC, TRIG, CHOLHDL, LDLDIRECT in the last 72 hours. Thyroid Function Tests: No results for input(s): TSH, T4TOTAL, FREET4, T3FREE, THYROIDAB in the last 72 hours. Anemia Panel: No results for input(s): VITAMINB12, FOLATE, FERRITIN, TIBC, IRON, RETICCTPCT in the last 72 hours. Sepsis Labs: Recent Labs  Lab 04/11/21 1423 04/12/21 0402  PROCALCITON  --  1.44  LATICACIDVEN 1.8  --     Recent Results (from the past 240 hour(s))  Blood culture (routine single)     Status: None (Preliminary result)   Collection Time: 04/11/21  2:20 PM   Specimen: BLOOD  Result Value Ref Range Status   Specimen Description   Final    BLOOD RIGHT ANTECUBITAL Performed at [facility]  [place], [address] [place], [place] [address]    Special Requests   Final    BOTTLES DRAWN AEROBIC AND ANAEROBIC Blood Culture adequate volume Performed at [facility] [place], [address] [place], [place] [address]    Culture   Final    NO GROWTH < 24 HOURS Performed at [facility], [address] [place], [place] [address]    Report Status PENDING  Incomplete  Resp Panel by RT-PCR (Flu A&B, Covid)     Status: Abnormal   Collection Time: 04/12/21  6:13 AM  Result Value Ref Range Status   SARS Coronavirus 2 by RT PCR POSITIVE (A) NEGATIVE Final    Comment: RESULT CALLED TO, READ BACK BY AND VERIFIED WITH: VAUGHN K. ON 04/12/2021 @ 0716 BY MECIAL J. (NOTE) SARS-CoV-2 target nucleic acids are DETECTED.  The SARS-CoV-2 RNA is generally detectable in upper respiratory specimens during the acute phase of infection. Positive results are indicative of the presence of the identified virus, but do not rule out bacterial infection or co-infection with other pathogens not detected by the test. Clinical correlation with patient history and other diagnostic information is necessary to determine patient infection status. The expected result is Negative.  Fact Sheet for Patients: BloggerCourse.comhttps://www.fda.gov/media/152166/download  Fact Sheet for Healthcare Providers: SeriousBroker.ithttps://www.fda.gov/media/152162/download  This test is not yet approved or cleared by the [place] FDA and  has been authorized for detection and/or diagnosis of SARS-CoV-2 by FDA under an Emergency Use Authorization (EUA).  This EUA will remain in effect (meaning this t est can be used) for the duration of  the COVID-19 declaration under Section 564(b)(1) of the Act, 21 U.S.C. section 360bbb-3(b)(1), unless the authorization is terminated or revoked sooner.     Influenza A by PCR NEGATIVE NEGATIVE Final   Influenza B by PCR NEGATIVE NEGATIVE Final    Comment: (NOTE) The Xpert Xpress  SARS-CoV-2/FLU/RSV plus assay is intended as an aid in the diagnosis of influenza from Nasopharyngeal swab specimens and should not be used as a sole basis for treatment. Nasal washings and aspirates are unacceptable for Xpert Xpress SARS-CoV-2/FLU/RSV testing.  Fact Sheet for Patients: BloggerCourse.comhttps://www.fda.gov/media/152166/download  Fact Sheet for Healthcare Providers: SeriousBroker.ithttps://www.fda.gov/media/152162/download  This test is not yet approved or cleared by the [place] FDA and has been authorized for detection and/or diagnosis of SARS-CoV-2 by FDA under an Emergency Use Authorization (EUA). This EUA will remain in effect (meaning this test can be used) for the duration of the COVID-19 declaration under Section 564(b)(1) of the Act, 21 U.S.C. section 360bbb-3(b)(1), unless the authorization is terminated or revoked.  Performed at [facility], [address] [place], [place] [address]     Radiology Studies: [facility]  Result Date: 04/11/2021 CLINICAL DATA:  Questionable sepsis, positive COVID EXAM: PORTABLE CHEST 1 VIEW COMPARISON:  Chest radiograph 01/08/2021 FINDINGS: The cardiomediastinal silhouette is within normal limits. Lung volumes are low. There is no focal consolidation or pulmonary edema. There is no pleural effusion or pneumothorax There  is no acute osseous abnormality. IMPRESSION: Low lung volumes. Otherwise, no radiographic evidence of acute cardiopulmonary process. Electronically Signed   By: Lesia Hausen MD   On: 04/11/2021 15:08     LOS: 1 day   Lanae Boast, MD [facility]  04/12/2021, 1:03 PM

## 2021-04-12 NOTE — Progress Notes (Signed)
Hypoglycemic Event  CBG: 69  Treatment: breakfast meal  Symptoms: shaky and weak  Follow-up CBG: Time:1030 CBG Result:147  Possible Reasons for Event: pt NPO  Comments/MD notified: Made MD aware of new finding    Charmian Muff

## 2021-04-12 NOTE — Progress Notes (Signed)
PHARMACY NOTE -  Cefepime  Pharmacy has been assisting with dosing of cefepime for urosepsis. Adjusted dose to 2g IV q8 hr, and further renal adjustments per institutional Pharmacy antibiotic protocol  Pharmacy will sign off, following peripherally for culture results or dose adjustments. Please reconsult if a change in clinical status warrants re-evaluation of dosage.  Bernadene Person, PharmD, BCPS [phone] 04/12/2021, 12:08 PM

## 2021-04-13 LAB — URINALYSIS, ROUTINE W REFLEX MICROSCOPIC
Bilirubin Urine: NEGATIVE
Glucose, UA: >=500 mg/dL — AB
Ketones, ur: NEGATIVE mg/dL
Nitrite: NEGATIVE
Protein, ur: NEGATIVE mg/dL
Specific Gravity, Urine: 1.015 (ref 1.005–1.030)
WBC, UA: >50 WBC/hpf — ABNORMAL HIGH (ref 0–5)
pH: 6 (ref 5.0–8.0)

## 2021-04-13 LAB — COMPREHENSIVE METABOLIC PANEL
ALT: 11 U/L (ref 0–44)
AST: 13 U/L — ABNORMAL LOW (ref 15–41)
Albumin: 2.3 g/dL — ABNORMAL LOW (ref 3.5–5.0)
Alkaline Phosphatase: 42 U/L (ref 38–126)
Anion gap: 7 (ref 5–15)
BUN: 22 mg/dL — ABNORMAL HIGH (ref 6–20)
CO2: 21 mmol/L — ABNORMAL LOW (ref 22–32)
Calcium: 8.2 mg/dL — ABNORMAL LOW (ref 8.9–10.3)
Chloride: 107 mmol/L (ref 98–111)
Creatinine, Ser: 0.72 mg/dL (ref 0.44–1.00)
GFR, Estimated: >60 mL/min (ref >60)
Glucose, Bld: 145 mg/dL — ABNORMAL HIGH (ref 70–99)
Potassium: 3.6 mmol/L (ref 3.5–5.1)
Sodium: 135 mmol/L (ref 135–145)
Total Bilirubin: 0.4 mg/dL (ref 0.3–1.2)
Total Protein: 6.1 g/dL — ABNORMAL LOW (ref 6.5–8.1)

## 2021-04-13 LAB — GLUCOSE, CAPILLARY
Glucose-Capillary: 131 mg/dL — ABNORMAL HIGH (ref 70–99)
Glucose-Capillary: 96 mg/dL (ref 70–99)
Glucose-Capillary: 137 mg/dL — ABNORMAL HIGH (ref 70–99)
Glucose-Capillary: 156 mg/dL — ABNORMAL HIGH (ref 70–99)
Glucose-Capillary: 160 mg/dL — ABNORMAL HIGH (ref 70–99)

## 2021-04-13 LAB — CBC
HCT: 32.1 % — ABNORMAL LOW (ref 36.0–46.0)
Hemoglobin: 10 g/dL — ABNORMAL LOW (ref 12.0–15.0)
MCH: 27.8 pg (ref 26.0–34.0)
MCHC: 31.2 g/dL (ref 30.0–36.0)
MCV: 89.2 fL (ref 80.0–100.0)
Platelets: 231 10*3/uL (ref 150–400)
RBC: 3.6 MIL/uL — ABNORMAL LOW (ref 3.87–5.11)
RDW: 14.8 % (ref 11.5–15.5)
WBC: 10.1 10*3/uL (ref 4.0–10.5)
nRBC: 0 % (ref 0.0–0.2)

## 2021-04-13 MED ORDER — SACUBITRIL-VALSARTAN 24-26 MG PO TABS
1.0000 | ORAL_TABLET | Freq: Two times a day (BID) | ORAL | Status: DC
  Administered 2021-04-13 – 2021-04-14 (×2): 1 via ORAL
  Filled 2021-04-13 (×2): qty 1

## 2021-04-13 MED ORDER — POLYETHYLENE GLYCOL 3350 17 G PO PACK
17.0000 g | PACK | Freq: Every day | ORAL | Status: DC
  Administered 2021-04-13: 17 g via ORAL
  Filled 2021-04-13 (×2): qty 1

## 2021-04-13 NOTE — Progress Notes (Signed)
Patient c/o burning sensation while urinating. MD aware. UA ordered. MD notified results are in chart.  Lidia Collum, RN

## 2021-04-13 NOTE — Plan of Care (Signed)
    Problem: Education: Goal: Knowledge of General Education information will improve Description: Including pain rating scale, medication(s)/side effects and non-pharmacologic comfort measures Outcome: Progressing   Problem: Health Behavior/Discharge Planning: Goal: Ability to manage health-related needs will improve Outcome: Progressing   Problem: Clinical Measurements: Goal: Ability to maintain clinical measurements within normal limits will improve Outcome: Progressing Goal: Will remain free from infection Outcome: Progressing Goal: Diagnostic test results will improve Outcome: Progressing Goal: Respiratory complications will improve Outcome: Progressing Goal: Cardiovascular complication will be avoided Outcome: Progressing   Problem: Activity: Goal: Risk for activity intolerance will decrease Outcome: Progressing   Problem: Nutrition: Goal: Adequate nutrition will be maintained Outcome: Progressing   Problem: Coping: Goal: Level of anxiety will decrease Outcome: Progressing   Problem: Elimination: Goal: Will not experience complications related to bowel motility Outcome: Progressing Goal: Will not experience complications related to urinary retention Outcome: Progressing   Problem: Pain Managment: Goal: General experience of comfort will improve Outcome: Progressing   Problem: Safety: Goal: Ability to remain free from injury will improve Outcome: Progressing   Problem: Skin Integrity: Goal: Risk for impaired skin integrity will decrease Outcome: Progressing   Problem: Education: Goal: Knowledge of risk factors and measures for prevention of condition will improve Outcome: Progressing   Problem: Coping: Goal: Psychosocial and spiritual needs will be supported Outcome: Progressing   Problem: Respiratory: Goal: Will maintain a patent airway Outcome: Progressing Goal: Complications related to the disease process, condition or treatment will be avoided  or minimized Outcome: Progressing   Problem: Urinary Elimination: Goal: Signs and symptoms of infection will decrease Outcome: Progressing   Problem: Metabolic: Goal: Ability to maintain appropriate glucose levels will improve Outcome: Progressing   Lidia Collum, RN

## 2021-04-13 NOTE — Plan of Care (Signed)
  Problem: Health Behavior/Discharge Planning: Goal: Ability to manage health-related needs will improve Outcome: Progressing   Problem: Urinary Elimination: Goal: Signs and symptoms of infection will decrease Outcome: Progressing   Problem: Coping: Goal: Level of anxiety will decrease Outcome: Progressing   Problem: Elimination: Goal: Will not experience complications related to bowel motility Outcome: Progressing Goal: Will not experience complications related to urinary retention Outcome: Progressing   Problem: Pain Managment: Goal: General experience of comfort will improve Outcome: Progressing

## 2021-04-13 NOTE — Progress Notes (Signed)
PROGRESS NOTE    Margaret Hess  KGU:[id number] DOB: 09-25-1965 DOA: 04/11/2021 PCP: Burnis MedinFulbright, Virginia E, PA-C   Chief Complaint  Patient presents with   Weakness   Brief Narrative: [phone] f w/ cva left hemiparesis, exp aphasia, chf, recent covid diagnosed Saturday bipolar disorder/anxiety who has been having nausea and had episode of vomiting, poor oral intake but no shortness of breath or chest pain feeling weak and generalized malaise, no abdominal pain diarrhea . She was at infusion center  for monoclonal antibodies but unable to get IV access.  She was sent to the ED on arrival was tachycardic, hypotensive in 72/54, saturating 95% on room air with elevated WBC count 22.3, hyponatremia AKI and need consistent with UTI code sepsis was initiated chest x-ray no acute finding.  Fluids and antibiotics were initiated patient is stabilized blood culture urine culture ordered and admitted  Subjective: Seen and examined this morning.  Patient feeling overall better. Overnight T-max 99.  Hemodynamically stable.  Leukocytosis resolved  Still complains of dysuria  Assessment & Plan:  Sepsis due to urinary tract infection POA: Currently vitally stable.  Leukocytosis resolved.  Continue cefepime for now pending further urine culture.blood culture negative. Today called med center [facility] -they are able to add on culture on the urine sample preantibiotics Recent Labs  Lab 04/11/21 1423 04/12/21 0402 04/13/21 0422  WBC 22.3* 13.2*  12.9* 10.1  LATICACIDVEN 1.8  --   --   PROCALCITON  --  1.44  --    COVID-19 positive no respiratory symptoms.  Tested positive last Saturday.  On contact airborne precaution.  CRP is elevated at 26 given patient's high risk features chronic medical condition -giving remdesivir x3 days, no need for steroid as not hypoxic.    Chronic systolic CHF with EF 40%: Remains euvolemic.  We will stop IV fluids.  Resume Entresto, continue Coreg, resume Lasix in the  morning.  Hyponatremia Mild metabolic acidosis bicarb 20 AKI ( no CKD):  Due to poor oral intake.  AKI resolved, bicarb improved to 21 sodium normalized stop IV fluids encourage oral intake  Recent Labs    10/08/20 0408 10/08/20 1423 10/17/20 1224 04/11/21 1423 04/12/21 0402 04/13/21 0422  BUN 12 14 14  56* 33* 22*  CREATININE 0.99 0.71 0.83 1.95* 1.01*  1.01* 0.72   Diabetes mellitus, with hypoglycemia 55.  Hold insulin and OHA.continue sliding scale insulin continue regular diet monitor glucose. Recent Labs  Lab 04/12/21 1611 04/12/21 1953 04/12/21 2321 04/13/21 0421 04/13/21 0749  GLUCAP 90 153* 123* 131* 96   Essential hypertension: Resume Entresto can resume diuretics tomorrow continue carvedilol   History of stroke with left-sided weakness chronic and stable continue home ASA.  Anemia likely from chronic disease.  Monitor hemoglobin  Morbid obesity BMI 38: Will benefit with weight loss/healthy lifestyle and PCP follow-up.    Diet Order             Diet regular Room service appropriate? Yes; Fluid consistency: Thin  Diet effective now                 DVT prophylaxis: heparin injection 5,000 Units Start: 04/11/21 2200 Code Status:   Code Status: Full Code  Family Communication: plan of care discussed with patient at bedside. Status is: Inpatient  Remains inpatient appropriate because:IV treatments appropriate due to intensity of illness or inability to take PO and Inpatient level of care appropriate due to severity of illness  Dispo: The patient is from: Home  Anticipated d/c is to: Home likely tomorrow              Patient currently is not medically stable to d/c.   Difficult to place patient No  Unresulted Labs (From admission, onward)     Start     Ordered   04/13/21 0500  CBC  Daily,   R      04/12/21 0816   04/13/21 0500  Comprehensive metabolic panel  Daily,   R      04/12/21 0816   04/12/21 1553  Urine Culture  Add-on,   AD        Question:  Indication  Answer:  Dysuria   04/12/21 1552   04/11/21 1423  Urine Culture  Once,   R        04/11/21 1423           Medications reviewed:  Scheduled Meds:  ARIPiprazole  2 mg Oral Daily   aspirin EC  81 mg Oral Daily   carvedilol  3.125 mg Oral BID WC   citalopram  40 mg Oral BH-[id number]   clobetasol cream   Topical BID   DULoxetine  60 mg Oral BID   fesoterodine  4 mg Oral Daily   gabapentin  800 mg Oral TID   heparin  5,000 Units Subcutaneous Q8H   insulin aspart  0-6 Units Subcutaneous Q4H   mirabegron ER  50 mg Oral Daily   pantoprazole  40 mg Oral Daily   sacubitril-valsartan  1 tablet Oral BID   Continuous Infusions:  ceFEPime (MAXIPIME) IV 2 g (04/13/21 8110)   remdesivir 100 mg in NS 100 mL 100 mg (04/13/21 0925)   Consultants:see note  Procedures:see note Antimicrobials: Anti-infectives (From admission, onward)    Start     Dose/Rate Route Frequency Ordered Stop   04/13/21 1000  remdesivir 100 mg in sodium chloride 0.9 % 100 mL IVPB  Status:  Discontinued       See Hyperspace for full Linked Orders Report.   100 mg 200 mL/hr over 30 Minutes Intravenous Daily 04/12/21 1203 04/12/21 1335   04/13/21 1000  remdesivir 100 mg in sodium chloride 0.9 % 100 mL IVPB       See Hyperspace for full Linked Orders Report.   100 mg 200 mL/hr over 30 Minutes Intravenous Daily 04/12/21 1335 04/15/21 0959   04/12/21 1400  ceFEPIme (MAXIPIME) 2 g in sodium chloride 0.9 % 100 mL IVPB        2 g 200 mL/hr over 30 Minutes Intravenous Every 8 hours 04/12/21 1207 04/18/21 0559   04/12/21 1245  remdesivir 200 mg in sodium chloride 0.9% 250 mL IVPB       See Hyperspace for full Linked Orders Report.   200 mg 580 mL/hr over 30 Minutes Intravenous Once 04/12/21 1203 04/12/21 1434   04/11/21 2200  ceFEPIme (MAXIPIME) 2 g in sodium chloride 0.9 % 100 mL IVPB  Status:  Discontinued        2 g 200 mL/hr over 30 Minutes Intravenous Every 12 hours 04/11/21 2024 04/12/21 1207    04/11/21 2115  ceFEPIme (MAXIPIME) 2 g in sodium chloride 0.9 % 100 mL IVPB  Status:  Discontinued        2 g 200 mL/hr over 30 Minutes Intravenous  Once 04/11/21 2026 04/11/21 2028   04/11/21 1545  cefTRIAXone (ROCEPHIN) 2 g in sodium chloride 0.9 % 100 mL IVPB  Status:  Discontinued  2 g 200 mL/hr over 30 Minutes Intravenous Every 24 hours 04/11/21 1540 04/11/21 2018      Culture/Microbiology    Component Value Date/Time   SDES  04/11/2021 1420    BLOOD RIGHT ANTECUBITAL Performed at [facility], [address] [place], [place] [address]    [facility]  04/11/2021 1420    BOTTLES DRAWN AEROBIC AND ANAEROBIC Blood Culture adequate volume Performed at [facility], [address] [place], [place] [address]    CULT  04/11/2021 1420    NO GROWTH < 24 HOURS Performed at [facility], [address], [place], [place] [address]    REPTSTATUS PENDING 04/11/2021 1420    Other culture-see note  Objective: Vitals: Today's Vitals   04/13/21 0131 04/13/21 0429 04/13/21 0500 04/13/21 1211  BP:  128/74  127/84  Pulse:  92  92  Resp:  18  18  Temp:  98.1 F (36.7 C)  98.5 F (36.9 C)  TempSrc:  Oral  Oral  SpO2:  94%  98%  Weight:   80.2 kg   Height:   5\' 5"  (1.651 m)   PainSc: Asleep       Intake/Output Summary (Last 24 hours) at 04/13/2021 1212 Last data filed at 04/13/2021 1013 Gross per 24 hour  Intake 1394.96 ml  Output 2300 ml  Net -905.04 ml   Filed Weights   04/11/21 1311 04/13/21 0500  Weight: 104 kg 80.2 kg   Weight change: -23.8 kg  Intake/Output from previous day: 08/11 0701 - 08/12 0700 In: 2191.8 [P.O.:720; I.V.:1171.8; IV Piggyback:300] Out: 2400 [Urine:2400] Intake/Output this shift: Total I/O In: -  Out: 600 [Urine:600] Filed Weights   04/11/21 1311 04/13/21 0500  Weight: 104 kg 80.2 kg   Examination:  General exam: AAOx3, older than stated age, weak appearing. HEENT:Oral mucosa moist, Ear/Nose WNL grossly,  dentition normal. Respiratory system: bilaterally diminished,  no use of accessory muscle Cardiovascular system: S1 & S2 +, No JVD,. Gastrointestinal system: Abdomen soft,NT,ND, BS+ Nervous System:Alert, awake, chronic left-sided weakness  Extremities: no edema, distal peripheral pulses palpable.  Skin: No rashes,no icterus. MSK: Normal muscle bulk,tone, power  Data Reviewed: I have personally reviewed following labs and imaging studies CBC: Recent Labs  Lab 04/11/21 1423 04/12/21 0402 04/13/21 0422  WBC 22.3* 13.2*  12.9* 10.1  NEUTROABS 17.3*  --   --   HGB 12.1 9.9*  10.0* 10.0*  HCT 36.9 31.2*  31.7* 32.1*  MCV 85.6 88.4  89.0 89.2  PLT 336 264  268 231   Basic Metabolic Panel: Recent Labs  Lab 04/11/21 1423 04/12/21 0402 04/13/21 0422  NA 127* 133* 135  K 3.5 3.6 3.6  CL 93* 106 107  CO2 20* 20* 21*  GLUCOSE 166* 105* 145*  BUN 56* 33* 22*  CREATININE 1.95* 1.01*  1.01* 0.72  CALCIUM 7.9* 7.4* 8.2*   GFR: Estimated Creatinine Clearance: 84.1 mL/min (by C-G formula based on SCr of 0.72 mg/dL). Liver Function Tests: Recent Labs  Lab 04/11/21 1423 04/12/21 0402 04/13/21 0422  AST 20 16 13*  ALT 13 12 11   ALKPHOS 57 44 42  BILITOT 0.4 0.3 0.4  PROT 7.8 6.2* 6.1*  ALBUMIN 3.3* 2.5* 2.3*   No results for input(s): LIPASE, AMYLASE in the last 168 hours. No results for input(s): AMMONIA in the last 168 hours. Coagulation Profile: Recent Labs  Lab 04/11/21 1423 04/12/21 0402  INR 1.1 1.0   Cardiac Enzymes: No  results for input(s): CKTOTAL, CKMB, CKMBINDEX, TROPONINI in the last 168 hours. BNP (last 3 results) No results for input(s): PROBNP in the last 8760 hours. HbA1C: Recent Labs    04/12/21 0900  HGBA1C 6.5*   CBG: Recent Labs  Lab 04/12/21 1611 04/12/21 1953 04/12/21 2321 04/13/21 0421 04/13/21 0749  GLUCAP 90 153* 123* 131* 96   Lipid Profile: No results for input(s): CHOL, HDL, LDLCALC, TRIG, CHOLHDL, LDLDIRECT in the last 72  hours. Thyroid Function Tests: No results for input(s): TSH, T4TOTAL, FREET4, T3FREE, THYROIDAB in the last 72 hours. Anemia Panel: No results for input(s): VITAMINB12, FOLATE, FERRITIN, TIBC, IRON, RETICCTPCT in the last 72 hours. Sepsis Labs: Recent Labs  Lab 04/11/21 1423 04/12/21 0402  PROCALCITON  --  1.44  LATICACIDVEN 1.8  --     Recent Results (from the past 240 hour(s))  Blood culture (routine single)     Status: None (Preliminary result)   Collection Time: 04/11/21  2:20 PM   Specimen: BLOOD  Result Value Ref Range Status   Specimen Description   Final    BLOOD RIGHT ANTECUBITAL Performed at [facility], [address] [place], [place] [address]    Special Requests   Final    BOTTLES DRAWN AEROBIC AND ANAEROBIC Blood Culture adequate volume Performed at [facility], [address] [place], [place] [address]    Culture   Final    NO GROWTH < 24 HOURS Performed at [facility], [address] [place], [place] [address]    Report Status PENDING  Incomplete  Resp Panel by RT-PCR (Flu A&B, Covid)     Status: Abnormal   Collection Time: 04/12/21  6:13 AM  Result Value Ref Range Status   SARS Coronavirus 2 by RT PCR POSITIVE (A) NEGATIVE Final    Comment: RESULT CALLED TO, READ BACK BY AND VERIFIED WITH: VAUGHN K. ON 04/12/2021 @ 0716 BY MECIAL J. (NOTE) SARS-CoV-2 target nucleic acids are DETECTED.  The SARS-CoV-2 RNA is generally detectable in upper respiratory specimens during the acute phase of infection. Positive results are indicative of the presence of the identified virus, but do not rule out bacterial infection or co-infection with other pathogens not detected by the test. Clinical correlation with patient history and other diagnostic information is necessary to determine patient infection status. The expected result is Negative.  Fact Sheet for Patients: BloggerCourse.com  Fact Sheet for  Healthcare Providers: SeriousBroker.it  This test is not yet approved or cleared by the [place] FDA and  has been authorized for detection and/or diagnosis of SARS-CoV-2 by FDA under an Emergency Use Authorization (EUA).  This EUA will remain in effect (meaning this t est can be used) for the duration of  the COVID-19 declaration under Section 564(b)(1) of the Act, 21 U.S.C. section 360bbb-3(b)(1), unless the authorization is terminated or revoked sooner.     Influenza A by PCR NEGATIVE NEGATIVE Final   Influenza B by PCR NEGATIVE NEGATIVE Final    Comment: (NOTE) The Xpert Xpress SARS-CoV-2/FLU/RSV plus assay is intended as an aid in the diagnosis of influenza from Nasopharyngeal swab specimens and should not be used as a sole basis for treatment. Nasal washings and aspirates are unacceptable for Xpert Xpress SARS-CoV-2/FLU/RSV testing.  Fact Sheet for Patients: BloggerCourse.com  Fact Sheet for Healthcare Providers: SeriousBroker.it  This test is not yet approved or cleared by the [place] FDA and has been authorized for detection and/or diagnosis of  SARS-CoV-2 by FDA under an Emergency Use Authorization (EUA). This EUA will remain in effect (meaning this test can be used) for the duration of the COVID-19 declaration under Section 564(b)(1) of the Act, 21 U.S.C. section 360bbb-3(b)(1), unless the authorization is terminated or revoked.  Performed at [facility], [address] [place], [place] [address]     Radiology Studies: [facility] Chest Port 1 View  Result Date: 04/11/2021 CLINICAL DATA:  Questionable sepsis, positive COVID EXAM: PORTABLE CHEST 1 VIEW COMPARISON:  Chest radiograph 01/08/2021 FINDINGS: The cardiomediastinal silhouette is within normal limits. Lung volumes are low. There is no focal consolidation or pulmonary edema. There is no pleural effusion or  pneumothorax There is no acute osseous abnormality. IMPRESSION: Low lung volumes. Otherwise, no radiographic evidence of acute cardiopulmonary process. Electronically Signed   By: Lesia Hausen MD   On: 04/11/2021 15:08     LOS: 2 days   Lanae Boast, MD [facility]  04/13/2021, 12:12 PM

## 2021-04-13 NOTE — Progress Notes (Signed)
Urine from [facility] med center micro lab, urine culture will be sent. Spoke with Clydie Braun from the lab. SRP, RN

## 2021-04-14 LAB — CBC
HCT: 37.1 % (ref 36.0–46.0)
Hemoglobin: 12 g/dL (ref 12.0–15.0)
MCH: 28 pg (ref 26.0–34.0)
MCHC: 32.3 g/dL (ref 30.0–36.0)
MCV: 86.5 fL (ref 80.0–100.0)
Platelets: 290 10*3/uL (ref 150–400)
RBC: 4.29 MIL/uL (ref 3.87–5.11)
RDW: 14.8 % (ref 11.5–15.5)
WBC: 10.7 10*3/uL — ABNORMAL HIGH (ref 4.0–10.5)
nRBC: 0 % (ref 0.0–0.2)

## 2021-04-14 LAB — COMPREHENSIVE METABOLIC PANEL
ALT: 12 U/L (ref 0–44)
AST: 13 U/L — ABNORMAL LOW (ref 15–41)
Albumin: 2.6 g/dL — ABNORMAL LOW (ref 3.5–5.0)
Alkaline Phosphatase: 47 U/L (ref 38–126)
Anion gap: 12 (ref 5–15)
BUN: 17 mg/dL (ref 6–20)
CO2: 20 mmol/L — ABNORMAL LOW (ref 22–32)
Calcium: 8.5 mg/dL — ABNORMAL LOW (ref 8.9–10.3)
Chloride: 102 mmol/L (ref 98–111)
Creatinine, Ser: 0.76 mg/dL (ref 0.44–1.00)
GFR, Estimated: >60 mL/min (ref >60)
Glucose, Bld: 151 mg/dL — ABNORMAL HIGH (ref 70–99)
Potassium: 3.7 mmol/L (ref 3.5–5.1)
Sodium: 134 mmol/L — ABNORMAL LOW (ref 135–145)
Total Bilirubin: 0.6 mg/dL (ref 0.3–1.2)
Total Protein: 6.9 g/dL (ref 6.5–8.1)

## 2021-04-14 LAB — GLUCOSE, CAPILLARY
Glucose-Capillary: 128 mg/dL — ABNORMAL HIGH (ref 70–99)
Glucose-Capillary: 136 mg/dL — ABNORMAL HIGH (ref 70–99)
Glucose-Capillary: 151 mg/dL — ABNORMAL HIGH (ref 70–99)

## 2021-04-14 LAB — URINE CULTURE
Culture: NO GROWTH
Culture: <10000 — AB

## 2021-04-14 MED ORDER — NITROFURANTOIN MONOHYD MACRO 100 MG PO CAPS: 100.0000 mg | ORAL_CAPSULE | Freq: Two times a day (BID) | ORAL | 0 refills | Status: AC

## 2021-04-14 NOTE — Progress Notes (Signed)
Pt discharged to home, Covid procedure followed. Acknowledged understanding of instructions. No acute issues noted. SRP, RN

## 2021-04-14 NOTE — Plan of Care (Signed)

## 2021-04-14 NOTE — Progress Notes (Signed)
Pt discharged to home instructions reviewed with pt. Pt acknowledged understanding of instructions. SRP, RN

## 2021-04-14 NOTE — Plan of Care (Signed)
  Problem: Education: Goal: Knowledge of General Education information will improve Description: Including pain rating scale, medication(s)/side effects and non-pharmacologic comfort measures 04/14/2021 1247 by Charmian Muff, RN Outcome: Adequate for Discharge 04/14/2021 1247 by Charmian Muff, RN Outcome: Progressing   Problem: Health Behavior/Discharge Planning: Goal: Ability to manage health-related needs will improve 04/14/2021 1247 by Charmian Muff, RN Outcome: Adequate for Discharge 04/14/2021 1247 by Charmian Muff, RN Outcome: Progressing   Problem: Clinical Measurements: Goal: Ability to maintain clinical measurements within normal limits will improve 04/14/2021 1247 by Charmian Muff, RN Outcome: Adequate for Discharge 04/14/2021 1247 by Charmian Muff, RN Outcome: Progressing Goal: Will remain free from infection Outcome: Adequate for Discharge Goal: Diagnostic test results will improve Outcome: Adequate for Discharge Goal: Respiratory complications will improve Outcome: Adequate for Discharge Goal: Cardiovascular complication will be avoided Outcome: Adequate for Discharge   Problem: Activity: Goal: Risk for activity intolerance will decrease Outcome: Adequate for Discharge   Problem: Nutrition: Goal: Adequate nutrition will be maintained Outcome: Adequate for Discharge   Problem: Coping: Goal: Level of anxiety will decrease Outcome: Adequate for Discharge   Problem: Elimination: Goal: Will not experience complications related to bowel motility Outcome: Adequate for Discharge Goal: Will not experience complications related to urinary retention Outcome: Adequate for Discharge   Problem: Pain Managment: Goal: General experience of comfort will improve Outcome: Adequate for Discharge   Problem: Safety: Goal: Ability to remain free from injury will improve Outcome: Adequate for Discharge   Problem: Skin Integrity: Goal: Risk for impaired  skin integrity will decrease Outcome: Adequate for Discharge   Problem: Education: Goal: Knowledge of risk factors and measures for prevention of condition will improve Outcome: Adequate for Discharge   Problem: Coping: Goal: Psychosocial and spiritual needs will be supported Outcome: Adequate for Discharge   Problem: Respiratory: Goal: Will maintain a patent airway Outcome: Adequate for Discharge Goal: Complications related to the disease process, condition or treatment will be avoided or minimized Outcome: Adequate for Discharge   Problem: Urinary Elimination: Goal: Signs and symptoms of infection will decrease Outcome: Adequate for Discharge   Problem: Metabolic: Goal: Ability to maintain appropriate glucose levels will improve Outcome: Adequate for Discharge

## 2021-04-14 NOTE — Plan of Care (Signed)
  Problem: Health Behavior/Discharge Planning: Goal: Ability to manage health-related needs will improve Outcome: Progressing    Problem: Urinary Elimination: Goal: Signs and symptoms of infection will decrease Outcome: Progressing   Problem: Metabolic: Goal: Ability to maintain appropriate glucose levels will improve Outcome: Progressing   Problem: Pain Managment: Goal: General experience of comfort will improve Outcome: Progressing   Problem: Coping: Goal: Level of anxiety will decrease Outcome: Progressing   Problem: Nutrition: Goal: Adequate nutrition will be maintained Outcome: Progressing

## 2021-04-14 NOTE — Discharge Summary (Signed)
Physician Discharge Summary  Margaret Hess OVF:[id number] DOB: 02-26-1966 DOA: 04/11/2021  PCP: Elisabeth Cara, PA-C  Admit date: 04/11/2021.  Discharge date: 04/14/2021  Admitted From: Home Disposition:  Home  Recommendations for Outpatient Follow-up:  Follow up with PCP in 1-2 weeks. Please obtain BMP/CBC in one week. Advised to take nitrofurantoin twice daily for next 5 days for UTI. Advised to remain in quarantine for next 4 days to complete 10-day quarantine.  Home Health: None Equipment/Devices: None  Discharge Condition: Good CODE STATUS:Full code Diet recommendation: Heart Healthy   Brief [facility] Course: This 55 years old  female w/ Hx. Of  cva,  left hemiparesis, expressive aphasia, chf, recent covid diagnosed this Saturday,  bipolar disorder/anxiety who has been having nausea and had episode of vomiting, poor oral intake but no shortness of breath or chest pain, She reports  feeling weak and generalized malaise, denies  abdominal pain,  diarrhea . She was at infusion center for monoclonal antibodies but unable to get IV access.  She was sent to the ED and upon arrival she was tachycardic, hypotensive in 72/54, saturating 95% on room air with elevated WBC count 22.3, hyponatremia,  AKI and UTI.  Code Sepsis was initiated chest x-ray showed no acute finding.  Fluids and antibiotics were initiated,  Patient was stabilized, blood and urine cultures were sent. Patient was admitted for sepsis secondary to UTI,  She was started on IV antibiotics.  She was also started on IV fluids.  She does not have any respiratory symptoms.  Patient was kept in airborne and droplet precautions.  Patient was given cefepime for UTI.  urine culture showed no growth but patient continued to have urinary symptoms,  antibiotics changed to Macrobid twice daily.  Patient feels better , has ambulated well and want to be discharged.  Patient is being discharged home on Macrobid twice daily.   She reports her family member is getting chemotherapy,  advised to remain in quarantine for 4 more days to complete 10 days.  She was managed for below problems.  Discharge Diagnoses:  Principal Problem:   Sepsis due to urinary tract infection ([place]) Active Problems:   Hypertensive heart disease   Diabetes mellitus ([place])   Pulmonary hypertension (H[facility])   Nonischemic cardiomyopathy ([place])   Lower urinary tract infectious disease   Sepsis ([place])   COVID-19 virus infection   AKI (acute kidney injury) ([place])  Sepsis due to urinary tract infection POA:  Currently vitally stable.  Leukocytosis resolved.  blood culture negative.  She was continued on cefepime.  Urine culture grew out no growth.  Cefepime discontinued,  changed to Macrobid twice daily since patient has mild symptoms.  Sepsis physiology resolved.   COVID-19 positive no respiratory symptoms.   Tested positive last Saturday.  On contact airborne precaution.  CRP is elevated at 26 given patient's high risk features chronic medical condition -completed remdesivir x3 days, no need for steroid as not hypoxic.     Chronic systolic CHF with EF 84%:  Remains euvolemic.  We will stop IV fluids.  Resumed Entresto, continue Coreg, resume Lasix    Hyponatremia > Resolved.  Mild metabolic acidosis bicarb 20 AKI ( no CKD):  Due to poor oral intake.  AKI resolved, bicarb improved to 21,  sodium normalized.  stopped IV fluids,  encouraged oral intake   Diabetes mellitus, with hypoglycemia 55.  Hold insulin and OHA. Continue sliding scale insulin,  continue regular diet monitor glucose.  Essential hypertension: Resume Delene Loll  can resume diuretics tomorrow continue carvedilol    History of stroke with left-sided weakness chronic and stable.  continue home ASA.   Anemia likely from chronic disease.  Monitor hemoglobin   Morbid obesity BMI 38: Will benefit with weight loss/healthy lifestyle and PCP follow-up   Discharge  Instructions  Discharge Instructions     Call MD for:  difficulty breathing, headache or visual disturbances   Complete by: As directed    Call MD for:  persistant dizziness or light-headedness   Complete by: As directed    Call MD for:  persistant nausea and vomiting   Complete by: As directed    Diet - low sodium heart healthy   Complete by: As directed    Diet Carb Modified   Complete by: As directed    Discharge instructions   Complete by: As directed    Advised to follow-up with primary care physician in 1 week. Advised to take nitrofurantoin twice daily for next 5 days for UTI. Advised to remain in quarantine for next 4 days to complete 10-day quarantine.   Increase activity slowly   Complete by: As directed       Allergies as of 04/14/2021       Reactions   Penicillins Rash, Other (See Comments)   Tolerates cephalosporins        Medication List     STOP taking these medications    potassium chloride 10 MEQ tablet Commonly known as: KLOR-CON       TAKE these medications    acetaminophen 500 MG tablet Commonly known as: TYLENOL Take 1,000 mg by mouth every 6 (six) hours as needed for pain.   ALPRAZolam 0.5 MG tablet Commonly known as: XANAX Take 0.5 mg by mouth 2 (two) times daily as needed for anxiety.   ARIPiprazole 2 MG tablet Commonly known as: ABILIFY Take 2 mg by mouth at bedtime.   aspirin EC 81 MG tablet Take 1 tablet (81 mg total) by mouth daily.   carvedilol 3.125 MG tablet Commonly known as: COREG Take 1 tablet (3.125 mg total) by mouth 2 (two) times daily with a meal. Please call for OV [phone]   citalopram 20 MG tablet Commonly known as: CELEXA Take 40 mg by mouth every morning.   clobetasol cream 0.05 % Commonly known as: TEMOVATE APPLY SMALL AMOUNT AA BID   DULoxetine 60 MG capsule Commonly known as: CYMBALTA Take 60 mg by mouth 2 (two) times daily.   Entresto 24-26 MG Generic drug: sacubitril-valsartan TAKE 1  TABLET BY MOUTH TWICE DAILY   furosemide 20 MG tablet Commonly known as: LASIX Take 20 mg by mouth daily.   gabapentin 800 MG tablet Commonly known as: NEURONTIN Take 800 mg by mouth in the morning, at noon, and at bedtime.   glipiZIDE 10 MG tablet Commonly known as: GLUCOTROL Take 10 mg by mouth daily.   HYDROcodone-acetaminophen 5-325 MG tablet Commonly known as: NORCO/VICODIN Take 1 tablet by mouth every 8 (eight) hours as needed for pain.   Jardiance 10 MG Tabs tablet Generic drug: empagliflozin Take 10 mg by mouth daily.   Lantus SoloStar 100 UNIT/ML Solostar Pen Generic drug: insulin glargine Inject 20 Units into the skin at bedtime.   metFORMIN 500 MG 24 hr tablet Commonly known as: GLUCOPHAGE-XR Take 1,000 mg by mouth 2 (two) times daily.   mirabegron ER 50 MG Tb24 tablet Commonly known as: MYRBETRIQ Take 50 mg by mouth daily.   naloxone 4 MG/0.1ML Liqd nasal spray  kit Commonly known as: NARCAN For suspected opioid overdose, spray 1 mL in each nostril.  Repeat after 3 minutes if no or minimal response.   nitrofurantoin (macrocrystal-monohydrate) 100 MG capsule Commonly known as: MACROBID Take 1 capsule (100 mg total) by mouth 2 (two) times daily for 5 days.   omeprazole 40 MG capsule Commonly known as: PRILOSEC Take 40 mg by mouth daily.   ondansetron 4 MG disintegrating tablet Commonly known as: ZOFRAN-ODT Take 4 mg by mouth as needed for nausea/vomiting.   ONE TOUCH ULTRA TEST test strip Generic drug: glucose blood 1 each by Other route as needed for other.   Toviaz 4 MG Tb24 tablet Generic drug: fesoterodine Take 4 mg by mouth daily.        Follow-up Information     [place], [place], PA-C Follow up in 1 week(s).   Specialty: Family Medicine Contact information: [address] [place] [address] [phone]         Richardo Priest, MD .   Specialties: Cardiology, Radiology Contact information: [address] [place] [address] [phone]                Allergies  Allergen Reactions   Penicillins Rash and Other (See Comments)    Tolerates cephalosporins    Consultations: None   Procedures/Studies: DG Chest Port 1 View  Result Date: 04/11/2021 CLINICAL DATA:  Questionable sepsis, positive COVID EXAM: PORTABLE CHEST 1 VIEW COMPARISON:  Chest radiograph 01/08/2021 FINDINGS: The cardiomediastinal silhouette is within normal limits. Lung volumes are low. There is no focal consolidation or pulmonary edema. There is no pleural effusion or pneumothorax There is no acute osseous abnormality. IMPRESSION: Low lung volumes. Otherwise, no radiographic evidence of acute cardiopulmonary process. Electronically Signed   By: Valetta Mole MD   On: 04/11/2021 15:08      Subjective: Patient was seen and examined at bedside.  Overnight events noted.  Patient reports feeling much improved.   She wants to be discharged.  She has ambulated well,  denies any respiratory symptoms.  Discharge Exam: Vitals:   04/13/21 2107 04/14/21 0518  BP: 129/80 133/87  Pulse: 94 93  Resp: 18 18  Temp: 98.2 F (36.8 C) 98.7 F (37.1 C)  SpO2: 97% 97%   Vitals:   04/13/21 1211 04/13/21 2107 04/14/21 0500 04/14/21 0518  BP: 127/84 129/80  133/87  Pulse: 92 94  93  Resp: _0 Temp: 98.5 F (36.9 C) 98.2 F (36.8 C)  98.7 F (37.1 C)  TempSrc: Oral Oral  Oral  SpO2: 98% 97%  97%  Weight:   72.8 kg   Height:   _1  (1.651 m)     General: Pt is alert, awake, not in acute distress Cardiovascular: RRR, S1/S2 +, no rubs, no gallops Respiratory: CTA bilaterally, no wheezing, no rhonchi Abdominal: Soft, NT, ND, bowel sounds + Extremities: no edema, no cyanosis    The results of significant diagnostics from this hospitalization (including imaging, microbiology, ancillary and laboratory) are listed below for reference.     Microbiology: Recent Results (from the past 240 hour(s))  Blood  culture (routine single)     Status: None (Preliminary result)   Collection Time: 04/11/21  2:20 PM   Specimen: BLOOD  Result Value Ref Range Status   Specimen Description   Final    BLOOD RIGHT ANTECUBITAL Performed at [facility], [address] [place], [place] [address]  Special Requests   Final    BOTTLES DRAWN AEROBIC AND ANAEROBIC Blood Culture adequate volume Performed at [facility], [address] [place], [place] [address]    Culture   Final    NO GROWTH 2 DAYS Performed at [facility], [place] [address] [place], [place] [address]    Report Status PENDING  Incomplete  Urine Culture     Status: None   Collection Time: 04/11/21  2:23 PM   Specimen: Urine, Random  Result Value Ref Range Status   Specimen Description   Final    URINE, RANDOM Performed at [facility], [address] [place], [place] [address]    Special Requests   Final    NONE Performed at [facility], [address] [place], [place] [address]    Culture   Final    NO GROWTH Performed at [facility], [place] [address] [place],  [address]    Report Status 04/14/2021 FINAL  Final  Resp Panel by RT-PCR (Flu A&B, Covid)     Status: Abnormal   Collection Time: 04/12/21  6:13 AM  Result Value Ref Range Status   SARS Coronavirus 2 by RT PCR POSITIVE (A) NEGATIVE Final    Comment: RESULT CALLED TO, READ BACK BY AND VERIFIED WITH: VAUGHN K. ON 04/12/2021 @ 0716 BY MECIAL J. (NOTE) SARS-CoV-2 target nucleic acids are DETECTED.  The SARS-CoV-2 RNA is generally detectable in upper respiratory specimens during the acute phase of infection. Positive results are indicative of the presence of the identified virus, but do not rule out bacterial infection or co-infection with other pathogens not detected by the test. Clinical correlation with patient history and other diagnostic information is necessary to determine patient infection status.  The expected result is Negative.  Fact Sheet for Patients: EntrepreneurPulse.com.au  Fact Sheet for Healthcare Providers: IncredibleEmployment.be  This test is not yet approved or cleared by the [place] FDA and  has been authorized for detection and/or diagnosis of SARS-CoV-2 by FDA under an Emergency Use Authorization (EUA).  This EUA will remain in effect (meaning this t est can be used) for the duration of  the COVID-19 declaration under Section 564(b)(1) of the Act, 21 U.S.C. section 360bbb-3(b)(1), unless the authorization is terminated or revoked sooner.     Influenza A by PCR NEGATIVE NEGATIVE Final   Influenza B by PCR NEGATIVE NEGATIVE Final    Comment: (NOTE) The Xpert Xpress SARS-CoV-2/FLU/RSV plus assay is intended as an aid in the diagnosis of influenza from Nasopharyngeal swab specimens and should not be used as a sole basis for treatment. Nasal washings and aspirates are unacceptable for Xpert Xpress SARS-CoV-2/FLU/RSV testing.  Fact Sheet for Patients: EntrepreneurPulse.com.au  Fact Sheet for Healthcare Providers: IncredibleEmployment.be  This test is not yet approved or cleared by the [place] FDA and has been authorized for detection and/or diagnosis of SARS-CoV-2 by FDA under an Emergency Use Authorization (EUA). This EUA will remain in effect (meaning this test can be used) for the duration of the COVID-19 declaration under Section 564(b)(1) of the Act, 21 U.S.C. section 360bbb-3(b)(1), unless the authorization is terminated or revoked.  Performed at [facility], [place] [address] [place],  [address]   Urine Culture     Status: Abnormal   Collection Time: 04/12/21  9:00 PM   Specimen: Urine, Clean Catch  Result Value Ref Range Status   Specimen Description  Final    URINE, CLEAN CATCH Performed at [facility], [place]  [address], [place], [place] [address]    Special Requests   Final    NONE Performed at [facility], [place] [address], [place], [place] [address]    Culture (A)  Final    <10,000 COLONIES/mL INSIGNIFICANT GROWTH Performed at [place] [address], [place], [place] [address]    Report Status 04/14/2021 FINAL  Final     Labs: BNP (last 3 results) No results for input(s): BNP in the last 8760 hours. Basic Metabolic Panel: Recent Labs  Lab 04/11/21 1423 04/12/21 0402 04/13/21 0422  NA 127* 133* 135  K 3.5 3.6 3.6  CL 93* 106 107  CO2 20* 20* 21*  GLUCOSE 166* 105* 145*  BUN 56* 33* 22*  CREATININE 1.95* 1.01*  1.01* 0.72  CALCIUM 7.9* 7.4* 8.2*   Liver Function Tests: Recent Labs  Lab 04/11/21 1423 04/12/21 0402 04/13/21 0422  AST 20 16 13*  ALT _0 ALKPHOS 57 44 42  BILITOT 0.4 0.3 0.4  PROT 7.8 6.2* 6.1*  ALBUMIN 3.3* 2.5* 2.3*   No results for input(s): LIPASE, AMYLASE in the last 168 hours. No results for input(s): AMMONIA in the last 168 hours. CBC: Recent Labs  Lab 04/11/21 1423 04/12/21 0402 04/13/21 0422  WBC 22.3* 13.2*  12.9* 10.1  NEUTROABS 17.3*  --   --   HGB 12.1 9.9*  10.0* 10.0*  HCT 36.9 31.2*  31.7* 32.1*  MCV 85.6 88.4  89.0 89.2  PLT 336 264  268 231   Cardiac Enzymes: No results for input(s): CKTOTAL, CKMB, CKMBINDEX, TROPONINI in the last 168 hours. BNP: Invalid input(s): POCBNP CBG: Recent Labs  Lab 04/13/21 1546 04/13/21 1943 04/14/21 0030 04/14/21 0451 04/14/21 0810  GLUCAP 156* 160* 136* 128* 151*   D-Dimer No results for input(s): DDIMER in the last 72 hours. Hgb A1c Recent Labs    04/12/21 0900  HGBA1C 6.5*   Lipid Profile No results for input(s): CHOL, HDL, LDLCALC, TRIG, CHOLHDL, LDLDIRECT in the last 72 hours. Thyroid function studies No results for input(s): TSH, T4TOTAL, T3FREE, THYROIDAB in the last 72 hours.  Invalid input(s): FREET3 Anemia work up No results  for input(s): VITAMINB12, FOLATE, FERRITIN, TIBC, IRON, RETICCTPCT in the last 72 hours. Urinalysis    Component Value Date/Time   COLORURINE YELLOW 04/13/2021 0936   APPEARANCEUR HAZY (A) 04/13/2021 0936   LABSPEC 1.015 04/13/2021 0936   PHURINE 6.0 04/13/2021 0936   GLUCOSEU >=500 (A) 04/13/2021 0936   HGBUR SMALL (A) 04/13/2021 0936   BILIRUBINUR NEGATIVE 04/13/2021 0936   KETONESUR NEGATIVE 04/13/2021 0936   PROTEINUR NEGATIVE 04/13/2021 0936   UROBILINOGEN 1.0 10/03/2014 1820   NITRITE NEGATIVE 04/13/2021 0936   LEUKOCYTESUR LARGE (A) 04/13/2021 0936   Sepsis Labs Invalid input(s): PROCALCITONIN,  WBC,  LACTICIDVEN Microbiology Recent Results (from the past 240 hour(s))  Blood culture (routine single)     Status: None (Preliminary result)   Collection Time: 04/11/21  2:20 PM   Specimen: BLOOD  Result Value Ref Range Status   Specimen Description   Final    BLOOD RIGHT ANTECUBITAL Performed at [facility], [address] [place], [place] [address]    Special Requests   Final    BOTTLES DRAWN AEROBIC AND ANAEROBIC Blood Culture adequate volume Performed at [facility], [address] [place], [place] [address]  Culture   Final    NO GROWTH 2 DAYS Performed at [facility], [place] [address], [place], [place] [address]    Report Status PENDING  Incomplete  Urine Culture     Status: None   Collection Time: 04/11/21  2:23 PM   Specimen: Urine, Random  Result Value Ref Range Status   Specimen Description   Final    URINE, RANDOM Performed at [facility], [address] [place], [place] [address]    Special Requests   Final    NONE Performed at [facility], [address] [place], [place] [address]    Culture   Final    NO GROWTH Performed at [facility], [place] [address], [place], [place] [address]    Report Status 04/14/2021 FINAL  Final  Resp Panel by RT-PCR (Flu A&B, Covid)     Status: Abnormal    Collection Time: 04/12/21  6:13 AM  Result Value Ref Range Status   SARS Coronavirus 2 by RT PCR POSITIVE (A) NEGATIVE Final    Comment: RESULT CALLED TO, READ BACK BY AND VERIFIED WITH: VAUGHN K. ON 04/12/2021 @ 0716 BY MECIAL J. (NOTE) SARS-CoV-2 target nucleic acids are DETECTED.  The SARS-CoV-2 RNA is generally detectable in upper respiratory specimens during the acute phase of infection. Positive results are indicative of the presence of the identified virus, but do not rule out bacterial infection or co-infection with other pathogens not detected by the test. Clinical correlation with patient history and other diagnostic information is necessary to determine patient infection status. The expected result is Negative.  Fact Sheet for Patients: EntrepreneurPulse.com.au  Fact Sheet for Healthcare Providers: IncredibleEmployment.be  This test is not yet approved or cleared by the [place] FDA and  has been authorized for detection and/or diagnosis of SARS-CoV-2 by FDA under an Emergency Use Authorization (EUA).  This EUA will remain in effect (meaning this t est can be used) for the duration of  the COVID-19 declaration under Section 564(b)(1) of the Act, 21 U.S.C. section 360bbb-3(b)(1), unless the authorization is terminated or revoked sooner.     Influenza A by PCR NEGATIVE NEGATIVE Final   Influenza B by PCR NEGATIVE NEGATIVE Final    Comment: (NOTE) The Xpert Xpress SARS-CoV-2/FLU/RSV plus assay is intended as an aid in the diagnosis of influenza from Nasopharyngeal swab specimens and should not be used as a sole basis for treatment. Nasal washings and aspirates are unacceptable for Xpert Xpress SARS-CoV-2/FLU/RSV testing.  Fact Sheet for Patients: EntrepreneurPulse.com.au  Fact Sheet for Healthcare Providers: IncredibleEmployment.be  This test is not yet approved or cleared by the [place] FDA and has been authorized for detection and/or diagnosis of SARS-CoV-2 by FDA under an Emergency Use Authorization (EUA). This EUA will remain in effect (meaning this test can be used) for the duration of the COVID-19 declaration under Section 564(b)(1) of the Act, 21 U.S.C. section 360bbb-3(b)(1), unless the authorization is terminated or revoked.  Performed at [facility], [place] [address], [place], [place] [address]   Urine Culture     Status: Abnormal   Collection Time: 04/12/21  9:00 PM   Specimen: Urine, Clean Catch  Result Value Ref Range Status   Specimen Description   Final    URINE, CLEAN CATCH Performed at [facility], [place] [address] [place], [place] [address]    Special Requests   Final    NONE Performed  at [place], [place] [address], [place], [place] [address]    Culture (A)  Final    <10,000 COLONIES/mL INSIGNIFICANT GROWTH Performed at [place] [address], [place], [place] [address]    Report Status 04/14/2021 FINAL  Final     Time coordinating discharge: Over 30 minutes  SIGNED:   Shawna Clamp, MD  [facility] 04/14/2021, 11:38 AM Pager   If 7PM-7AM, please contact night-coverage

## 2021-04-14 NOTE — Discharge Instructions (Signed)
Advised to follow-up with primary care physician in 1 week. Advised to take nitrofurantoin twice daily for next 5 days for UTI. Advised to remain in quarantine for next 4 days to complete 10-day quarantine.

## 2021-04-16 LAB — CULTURE, BLOOD (SINGLE)
Culture: NO GROWTH
Special Requests: ADEQUATE

## 2021-05-22 DIAGNOSIS — N390 Urinary tract infection, site not specified: Secondary | ICD-10-CM

## 2021-05-22 HISTORY — DX: Urinary tract infection, site not specified: N39.0

## 2021-07-09 ENCOUNTER — Other Ambulatory Visit: Payer: Self-pay | Admitting: Cardiology

## 2021-07-18 ENCOUNTER — Other Ambulatory Visit: Payer: Self-pay | Admitting: Cardiology

## 2021-07-25 ENCOUNTER — Other Ambulatory Visit: Payer: Self-pay | Admitting: Cardiology

## 2021-08-23 ENCOUNTER — Other Ambulatory Visit: Payer: Self-pay | Admitting: Cardiology

## 2021-10-02 ENCOUNTER — Other Ambulatory Visit: Payer: Self-pay | Admitting: Cardiology

## 2021-10-18 ENCOUNTER — Other Ambulatory Visit: Payer: Self-pay | Admitting: Cardiology

## 2021-11-06 ENCOUNTER — Other Ambulatory Visit: Payer: Self-pay | Admitting: Cardiology

## 2021-11-15 ENCOUNTER — Other Ambulatory Visit: Payer: Self-pay | Admitting: Cardiology

## 2021-11-15 NOTE — Telephone Encounter (Signed)
Entresto 24-26 mg # 30 only with message for patient that appointment is needed for future refills / 2nd attempt ?Sent to [facility] [id number] - [place], [place] - [address] AT [place] ?

## 2021-12-04 ENCOUNTER — Other Ambulatory Visit: Payer: Self-pay | Admitting: Cardiology

## 2021-12-17 ENCOUNTER — Other Ambulatory Visit: Payer: Self-pay | Admitting: Cardiology

## 2021-12-19 ENCOUNTER — Other Ambulatory Visit: Payer: Self-pay | Admitting: Cardiology

## 2021-12-24 ENCOUNTER — Other Ambulatory Visit: Payer: Self-pay | Admitting: Cardiology

## 2022-01-08 ENCOUNTER — Other Ambulatory Visit: Payer: Self-pay | Admitting: Cardiology

## 2022-01-22 DIAGNOSIS — N3030 Trigonitis without hematuria: Secondary | ICD-10-CM

## 2022-01-22 HISTORY — DX: Trigonitis without hematuria: N30.30

## 2022-02-01 ENCOUNTER — Telehealth: Payer: Self-pay | Admitting: Cardiology

## 2022-02-01 ENCOUNTER — Telehealth: Payer: Self-pay

## 2022-02-01 NOTE — Telephone Encounter (Signed)
Pt c/o Shortness Of Breath: STAT if SOB developed within the last 24 hours or pt is noticeably SOB on the phone  1. Are you currently SOB (can you hear that pt is SOB on the phone)? No  2. How long have you been experiencing SOB?  This week  3. Are you SOB when sitting or when up moving around? Both  4. Are you currently experiencing any other symptoms?  Pt states that it's getting harder for her to breath especially when she walks. Please advise

## 2022-02-01 NOTE — Telephone Encounter (Signed)
Returned pts call. She stated that she was lying in bed and just didn't feel good. She was a little dizzy and a little short of breath when walking around. She denied any chest pain, SOB at rest, no changes in medications, no fever, cough/congestion. Consulted Dr. Dulce Sellar and he recommended that she see her family physician for an evaluation of her weakness and dizziness. Encouraged pt to call for an appt. She verbalized understanding and stated that she would call her PCP for an appt.

## 2022-02-02 ENCOUNTER — Other Ambulatory Visit: Payer: Self-pay

## 2022-02-02 ENCOUNTER — Inpatient Hospital Stay (HOSPITAL_BASED_OUTPATIENT_CLINIC_OR_DEPARTMENT_OTHER)
Admission: EM | Admit: 2022-02-02 | Discharge: 2022-02-04 | DRG: 291 | Disposition: A | Discharge status: Home Health | Payer: Medicare PPO | Attending: Internal Medicine | Admitting: Internal Medicine

## 2022-02-02 ENCOUNTER — Encounter (HOSPITAL_BASED_OUTPATIENT_CLINIC_OR_DEPARTMENT_OTHER): Payer: Self-pay | Admitting: Emergency Medicine

## 2022-02-02 ENCOUNTER — Emergency Department (HOSPITAL_BASED_OUTPATIENT_CLINIC_OR_DEPARTMENT_OTHER): Payer: Medicare PPO

## 2022-02-02 DIAGNOSIS — Z823 Family history of stroke: Secondary | ICD-10-CM

## 2022-02-02 DIAGNOSIS — E1165 Type 2 diabetes mellitus with hyperglycemia: Secondary | ICD-10-CM | POA: Diagnosis present

## 2022-02-02 DIAGNOSIS — I272 Pulmonary hypertension, unspecified: Secondary | ICD-10-CM | POA: Diagnosis present

## 2022-02-02 DIAGNOSIS — F419 Anxiety disorder, unspecified: Secondary | ICD-10-CM | POA: Diagnosis present

## 2022-02-02 DIAGNOSIS — Z91148 Patient's other noncompliance with medication regimen for other reason: Secondary | ICD-10-CM

## 2022-02-02 DIAGNOSIS — R739 Hyperglycemia, unspecified: Secondary | ICD-10-CM

## 2022-02-02 DIAGNOSIS — I69354 Hemiplegia and hemiparesis following cerebral infarction affecting left non-dominant side: Secondary | ICD-10-CM | POA: Diagnosis not present

## 2022-02-02 DIAGNOSIS — Z7982 Long term (current) use of aspirin: Secondary | ICD-10-CM

## 2022-02-02 DIAGNOSIS — I110 Hypertensive heart disease with heart failure: Secondary | ICD-10-CM | POA: Diagnosis present

## 2022-02-02 DIAGNOSIS — Z880 Allergy status to penicillin: Secondary | ICD-10-CM

## 2022-02-02 DIAGNOSIS — I5042 Chronic combined systolic (congestive) and diastolic (congestive) heart failure: Secondary | ICD-10-CM | POA: Diagnosis not present

## 2022-02-02 DIAGNOSIS — Z794 Long term (current) use of insulin: Secondary | ICD-10-CM

## 2022-02-02 DIAGNOSIS — T383X5A Adverse effect of insulin and oral hypoglycemic [antidiabetic] drugs, initial encounter: Secondary | ICD-10-CM | POA: Diagnosis present

## 2022-02-02 DIAGNOSIS — I5021 Acute systolic (congestive) heart failure: Secondary | ICD-10-CM | POA: Diagnosis not present

## 2022-02-02 DIAGNOSIS — N3000 Acute cystitis without hematuria: Secondary | ICD-10-CM

## 2022-02-02 DIAGNOSIS — Z20822 Contact with and (suspected) exposure to covid-19: Secondary | ICD-10-CM | POA: Diagnosis present

## 2022-02-02 DIAGNOSIS — E876 Hypokalemia: Secondary | ICD-10-CM | POA: Diagnosis present

## 2022-02-02 DIAGNOSIS — I10 Essential (primary) hypertension: Secondary | ICD-10-CM | POA: Diagnosis present

## 2022-02-02 DIAGNOSIS — I5043 Acute on chronic combined systolic (congestive) and diastolic (congestive) heart failure: Secondary | ICD-10-CM | POA: Diagnosis present

## 2022-02-02 DIAGNOSIS — I43 Cardiomyopathy in diseases classified elsewhere: Secondary | ICD-10-CM | POA: Diagnosis present

## 2022-02-02 DIAGNOSIS — Z7984 Long term (current) use of oral hypoglycemic drugs: Secondary | ICD-10-CM | POA: Diagnosis not present

## 2022-02-02 DIAGNOSIS — I119 Hypertensive heart disease without heart failure: Secondary | ICD-10-CM | POA: Diagnosis present

## 2022-02-02 DIAGNOSIS — Z79899 Other long term (current) drug therapy: Secondary | ICD-10-CM

## 2022-02-02 DIAGNOSIS — N3010 Interstitial cystitis (chronic) without hematuria: Secondary | ICD-10-CM | POA: Diagnosis present

## 2022-02-02 DIAGNOSIS — I159 Secondary hypertension, unspecified: Secondary | ICD-10-CM | POA: Diagnosis not present

## 2022-02-02 DIAGNOSIS — I639 Cerebral infarction, unspecified: Secondary | ICD-10-CM | POA: Diagnosis present

## 2022-02-02 DIAGNOSIS — Y92009 Unspecified place in unspecified non-institutional (private) residence as the place of occurrence of the external cause: Secondary | ICD-10-CM | POA: Diagnosis not present

## 2022-02-02 DIAGNOSIS — Z792 Long term (current) use of antibiotics: Secondary | ICD-10-CM | POA: Diagnosis not present

## 2022-02-02 DIAGNOSIS — Z807 Family history of other malignant neoplasms of lymphoid, hematopoietic and related tissues: Secondary | ICD-10-CM

## 2022-02-02 DIAGNOSIS — Z87440 Personal history of urinary (tract) infections: Secondary | ICD-10-CM | POA: Diagnosis not present

## 2022-02-02 DIAGNOSIS — E1140 Type 2 diabetes mellitus with diabetic neuropathy, unspecified: Secondary | ICD-10-CM | POA: Diagnosis present

## 2022-02-02 DIAGNOSIS — Z8249 Family history of ischemic heart disease and other diseases of the circulatory system: Secondary | ICD-10-CM | POA: Diagnosis not present

## 2022-02-02 DIAGNOSIS — F319 Bipolar disorder, unspecified: Secondary | ICD-10-CM | POA: Diagnosis present

## 2022-02-02 DIAGNOSIS — I509 Heart failure, unspecified: Secondary | ICD-10-CM | POA: Diagnosis present

## 2022-02-02 DIAGNOSIS — I6310 Cerebral infarction due to embolism of unspecified precerebral artery: Secondary | ICD-10-CM | POA: Diagnosis not present

## 2022-02-02 DIAGNOSIS — A419 Sepsis, unspecified organism: Secondary | ICD-10-CM

## 2022-02-02 DIAGNOSIS — E119 Type 2 diabetes mellitus without complications: Secondary | ICD-10-CM

## 2022-02-02 LAB — HEMOGLOBIN A1C
Hgb A1c MFr Bld: 8.6 % — ABNORMAL HIGH (ref 4.8–5.6)
Mean Plasma Glucose: 200.12 mg/dL

## 2022-02-02 LAB — IRON AND TIBC
Iron: 56 ug/dL (ref 28–170)
Saturation Ratios: 15 % (ref 10.4–31.8)
TIBC: 368 ug/dL (ref 250–450)
UIBC: 312 ug/dL

## 2022-02-02 LAB — CBC WITH DIFFERENTIAL/PLATELET
Abs Immature Granulocytes: 0.05 10*3/uL (ref 0.00–0.07)
Basophils Absolute: 0 10*3/uL (ref 0.0–0.1)
Basophils Relative: 0 %
Eosinophils Absolute: 0.1 10*3/uL (ref 0.0–0.5)
Eosinophils Relative: 1 %
HCT: 32 % — ABNORMAL LOW (ref 36.0–46.0)
Hemoglobin: 10.9 g/dL — ABNORMAL LOW (ref 12.0–15.0)
Immature Granulocytes: 0 %
Lymphocytes Relative: 20 %
Lymphs Abs: 2.3 10*3/uL (ref 0.7–4.0)
MCH: 28.5 pg (ref 26.0–34.0)
MCHC: 34.1 g/dL (ref 30.0–36.0)
MCV: 83.6 fL (ref 80.0–100.0)
Monocytes Absolute: 0.6 10*3/uL (ref 0.1–1.0)
Monocytes Relative: 5 %
Neutro Abs: 8.4 10*3/uL — ABNORMAL HIGH (ref 1.7–7.7)
Neutrophils Relative %: 74 %
Platelets: 323 10*3/uL (ref 150–400)
RBC: 3.83 MIL/uL — ABNORMAL LOW (ref 3.87–5.11)
RDW: 13.4 % (ref 11.5–15.5)
WBC: 11.5 10*3/uL — ABNORMAL HIGH (ref 4.0–10.5)
nRBC: 0 % (ref 0.0–0.2)

## 2022-02-02 LAB — GLUCOSE, CAPILLARY
Glucose-Capillary: 304 mg/dL — ABNORMAL HIGH (ref 70–99)
Glucose-Capillary: 341 mg/dL — ABNORMAL HIGH (ref 70–99)

## 2022-02-02 LAB — TROPONIN I (HIGH SENSITIVITY)
Troponin I (High Sensitivity): 15 ng/L (ref <18)
Troponin I (High Sensitivity): 16 ng/L (ref <18)

## 2022-02-02 LAB — URINALYSIS, ROUTINE W REFLEX MICROSCOPIC
Bilirubin Urine: NEGATIVE
Glucose, UA: >=500 mg/dL — AB
Ketones, ur: NEGATIVE mg/dL
Nitrite: NEGATIVE
Protein, ur: NEGATIVE mg/dL
Specific Gravity, Urine: 1.01 (ref 1.005–1.030)
pH: 7 (ref 5.0–8.0)

## 2022-02-02 LAB — RESP PANEL BY RT-PCR (FLU A&B, COVID) ARPGX2
Influenza A by PCR: NEGATIVE
Influenza B by PCR: NEGATIVE
SARS Coronavirus 2 by RT PCR: NEGATIVE

## 2022-02-02 LAB — BASIC METABOLIC PANEL
Anion gap: 11 (ref 5–15)
BUN: 16 mg/dL (ref 6–20)
CO2: 23 mmol/L (ref 22–32)
Calcium: 8.6 mg/dL — ABNORMAL LOW (ref 8.9–10.3)
Chloride: 97 mmol/L — ABNORMAL LOW (ref 98–111)
Creatinine, Ser: 0.75 mg/dL (ref 0.44–1.00)
GFR, Estimated: >60 mL/min (ref >60)
Glucose, Bld: 412 mg/dL — ABNORMAL HIGH (ref 70–99)
Potassium: 3.9 mmol/L (ref 3.5–5.1)
Sodium: 131 mmol/L — ABNORMAL LOW (ref 135–145)

## 2022-02-02 LAB — FERRITIN: Ferritin: 24 ng/mL (ref 11–307)

## 2022-02-02 LAB — LACTIC ACID, PLASMA
Lactic Acid, Venous: 2.2 mmol/L (ref 0.5–1.9)
Lactic Acid, Venous: 2.5 mmol/L (ref 0.5–1.9)

## 2022-02-02 LAB — URINALYSIS, MICROSCOPIC (REFLEX)

## 2022-02-02 LAB — BRAIN NATRIURETIC PEPTIDE: B Natriuretic Peptide: 1603.2 pg/mL — ABNORMAL HIGH (ref 0.0–100.0)

## 2022-02-02 MED ORDER — EMPAGLIFLOZIN 10 MG PO TABS
10.0000 mg | ORAL_TABLET | Freq: Every day | ORAL | Status: DC
  Administered 2022-02-03 – 2022-02-04 (×2): 10 mg via ORAL
  Filled 2022-02-02 (×2): qty 1

## 2022-02-02 MED ORDER — INSULIN ASPART 100 UNIT/ML IJ SOLN
5.0000 [IU] | Freq: Once | INTRAMUSCULAR | Status: AC
  Administered 2022-02-02: 5 [IU] via SUBCUTANEOUS

## 2022-02-02 MED ORDER — INSULIN GLARGINE-YFGN 100 UNIT/ML ~~LOC~~ SOLN
20.0000 [IU] | Freq: Every day | SUBCUTANEOUS | Status: DC
  Administered 2022-02-03 – 2022-02-04 (×2): 20 [IU] via SUBCUTANEOUS
  Filled 2022-02-02 (×2): qty 0.2

## 2022-02-02 MED ORDER — SACUBITRIL-VALSARTAN 24-26 MG PO TABS
1.0000 | ORAL_TABLET | Freq: Two times a day (BID) | ORAL | Status: DC
  Administered 2022-02-02 – 2022-02-04 (×4): 1 via ORAL
  Filled 2022-02-02 (×4): qty 1

## 2022-02-02 MED ORDER — HYDROCODONE-ACETAMINOPHEN 5-325 MG PO TABS
1.0000 | ORAL_TABLET | Freq: Three times a day (TID) | ORAL | Status: DC | PRN
  Administered 2022-02-02: 1 via ORAL
  Filled 2022-02-02: qty 1

## 2022-02-02 MED ORDER — ACETAMINOPHEN 325 MG PO TABS: 650.0000 mg | ORAL_TABLET | ORAL | Status: DC | PRN

## 2022-02-02 MED ORDER — SODIUM CHLORIDE 0.9% FLUSH
3.0000 mL | Freq: Two times a day (BID) | INTRAVENOUS | Status: DC
  Administered 2022-02-02 – 2022-02-03 (×3): 3 mL via INTRAVENOUS

## 2022-02-02 MED ORDER — LINAGLIPTIN 5 MG PO TABS
5.0000 mg | ORAL_TABLET | Freq: Every day | ORAL | Status: DC
  Administered 2022-02-03 – 2022-02-04 (×2): 5 mg via ORAL
  Filled 2022-02-02 (×2): qty 1

## 2022-02-02 MED ORDER — SODIUM CHLORIDE 0.9 % IV SOLN
250.0000 mL | INTRAVENOUS | Status: DC | PRN
  Administered 2022-02-02: 250 mL via INTRAVENOUS

## 2022-02-02 MED ORDER — INSULIN ASPART 100 UNIT/ML IJ SOLN
0.0000 [IU] | Freq: Three times a day (TID) | INTRAMUSCULAR | Status: DC
  Administered 2022-02-03: 15 [IU] via SUBCUTANEOUS
  Administered 2022-02-03: 8 [IU] via SUBCUTANEOUS
  Administered 2022-02-03 – 2022-02-04 (×2): 3 [IU] via SUBCUTANEOUS

## 2022-02-02 MED ORDER — CITALOPRAM HYDROBROMIDE 20 MG PO TABS
40.0000 mg | ORAL_TABLET | ORAL | Status: DC
  Administered 2022-02-03 – 2022-02-04 (×2): 40 mg via ORAL
  Filled 2022-02-02 (×2): qty 2

## 2022-02-02 MED ORDER — PANTOPRAZOLE SODIUM 40 MG PO TBEC
40.0000 mg | DELAYED_RELEASE_TABLET | Freq: Every day | ORAL | Status: DC
  Administered 2022-02-02 – 2022-02-04 (×3): 40 mg via ORAL
  Filled 2022-02-02 (×3): qty 1

## 2022-02-02 MED ORDER — ASPIRIN 81 MG PO TBEC
81.0000 mg | DELAYED_RELEASE_TABLET | Freq: Every day | ORAL | Status: DC
  Administered 2022-02-02 – 2022-02-04 (×3): 81 mg via ORAL
  Filled 2022-02-02 (×3): qty 1

## 2022-02-02 MED ORDER — ARIPIPRAZOLE 2 MG PO TABS
2.0000 mg | ORAL_TABLET | Freq: Every day | ORAL | Status: DC
  Administered 2022-02-02 – 2022-02-04 (×3): 2 mg via ORAL
  Filled 2022-02-02 (×3): qty 1

## 2022-02-02 MED ORDER — SODIUM CHLORIDE 0.9% FLUSH: 3.0000 mL | INTRAVENOUS | Status: DC | PRN

## 2022-02-02 MED ORDER — FUROSEMIDE 10 MG/ML IJ SOLN
40.0000 mg | Freq: Every day | INTRAMUSCULAR | Status: DC
  Administered 2022-02-03 – 2022-02-04 (×2): 40 mg via INTRAVENOUS
  Filled 2022-02-02 (×2): qty 4

## 2022-02-02 MED ORDER — FUROSEMIDE 10 MG/ML IJ SOLN
80.0000 mg | Freq: Once | INTRAMUSCULAR | Status: AC
Start: 2022-02-02 — End: 2022-02-02
  Administered 2022-02-02: 80 mg via INTRAVENOUS
  Filled 2022-02-02: qty 8

## 2022-02-02 MED ORDER — FESOTERODINE FUMARATE ER 4 MG PO TB24
4.0000 mg | ORAL_TABLET | Freq: Every day | ORAL | Status: DC
  Administered 2022-02-03 – 2022-02-04 (×2): 4 mg via ORAL
  Filled 2022-02-02 (×2): qty 1

## 2022-02-02 MED ORDER — GABAPENTIN 400 MG PO CAPS
800.0000 mg | ORAL_CAPSULE | Freq: Three times a day (TID) | ORAL | Status: DC
  Administered 2022-02-02 – 2022-02-04 (×5): 800 mg via ORAL
  Filled 2022-02-02 (×5): qty 2

## 2022-02-02 MED ORDER — ONDANSETRON HCL 4 MG/2ML IJ SOLN: 4.0000 mg | Freq: Four times a day (QID) | INTRAMUSCULAR | Status: DC | PRN

## 2022-02-02 MED ORDER — INSULIN ASPART 100 UNIT/ML IJ SOLN
0.0000 [IU] | Freq: Every day | INTRAMUSCULAR | Status: DC
  Administered 2022-02-02: 4 [IU] via SUBCUTANEOUS
  Administered 2022-02-03: 2 [IU] via SUBCUTANEOUS

## 2022-02-02 MED ORDER — CARVEDILOL 6.25 MG PO TABS
6.2500 mg | ORAL_TABLET | Freq: Two times a day (BID) | ORAL | Status: DC
  Administered 2022-02-02 – 2022-02-04 (×4): 6.25 mg via ORAL
  Filled 2022-02-02 (×4): qty 1

## 2022-02-02 MED ORDER — DULOXETINE HCL 60 MG PO CPEP
60.0000 mg | ORAL_CAPSULE | Freq: Two times a day (BID) | ORAL | Status: DC
  Administered 2022-02-02 – 2022-02-04 (×4): 60 mg via ORAL
  Filled 2022-02-02 (×4): qty 1

## 2022-02-02 MED ORDER — ENOXAPARIN SODIUM 40 MG/0.4ML IJ SOSY: 40.0000 mg | PREFILLED_SYRINGE | INTRAMUSCULAR | Status: DC

## 2022-02-02 MED ORDER — SODIUM CHLORIDE 0.9 % IV BOLUS
500.0000 mL | Freq: Once | INTRAVENOUS | Status: AC
  Administered 2022-02-02: 500 mL via INTRAVENOUS

## 2022-02-02 MED ORDER — ONDANSETRON 4 MG PO TBDP: 4.0000 mg | ORAL_TABLET | ORAL | Status: DC | PRN

## 2022-02-02 MED ORDER — NITROFURANTOIN MONOHYD MACRO 100 MG PO CAPS
100.0000 mg | ORAL_CAPSULE | Freq: Every day | ORAL | Status: DC
  Administered 2022-02-02 – 2022-02-03 (×2): 100 mg via ORAL
  Filled 2022-02-02 (×3): qty 1

## 2022-02-02 MED ORDER — ALPRAZOLAM 0.5 MG PO TABS
0.5000 mg | ORAL_TABLET | Freq: Two times a day (BID) | ORAL | Status: DC | PRN
Start: 2022-02-02 — End: 2022-02-04
  Administered 2022-02-02 – 2022-02-03 (×2): 0.5 mg via ORAL
  Filled 2022-02-02 (×2): qty 1

## 2022-02-02 MED ORDER — MIRABEGRON ER 50 MG PO TB24
50.0000 mg | ORAL_TABLET | Freq: Every day | ORAL | Status: DC
  Administered 2022-02-03 – 2022-02-04 (×2): 50 mg via ORAL
  Filled 2022-02-02 (×2): qty 1

## 2022-02-02 MED ORDER — SODIUM CHLORIDE 0.9 % IV SOLN
1.0000 g | Freq: Once | INTRAVENOUS | Status: AC
  Administered 2022-02-02: 1 g via INTRAVENOUS
  Filled 2022-02-02: qty 10

## 2022-02-02 MED ORDER — ENOXAPARIN SODIUM 40 MG/0.4ML IJ SOSY
40.0000 mg | PREFILLED_SYRINGE | INTRAMUSCULAR | Status: DC
  Administered 2022-02-02 – 2022-02-03 (×2): 40 mg via SUBCUTANEOUS
  Filled 2022-02-02 (×2): qty 0.4

## 2022-02-02 MED ORDER — ACETAMINOPHEN 500 MG PO TABS: 1000.0000 mg | ORAL_TABLET | Freq: Four times a day (QID) | ORAL | Status: DC | PRN

## 2022-02-02 NOTE — ED Provider Notes (Signed)
[facility] EMERGENCY DEPARTMENT Provider Note   CSN: [id number] Arrival date & time: 02/02/22  7619     History  Chief Complaint  Patient presents with   Fatigue   Shortness of Breath    Margaret Hess is a 56 y.o. female.  Patient is a 56 year old female who presents with shortness of breath.  She has a history of CHF, diabetes, hypertension, prior stroke with left-sided hemiparesis, bipolar d/o.  She states over the last 2 to 3 days she has had some shortness of breath.  She says it started with activity but now she has shortness of breath even at rest.  She does not appear to have orthopnea.  No increased leg swelling.  No cough or cold symptoms.  No associated chest discomfort.  No fevers.  No URI symptoms.  No change in her medications.  She feels fatigued as well.  She is having some increased urination although she has some chronic bladder complaints.  She feels like she is urinating more frequently than normal.  No other changes.  No nausea vomiting or diarrhea other than she had 1 episode of vomiting yesterday morning but no ongoing symptoms.  No nausea.      Home Medications Prior to Admission medications   Medication Sig Start Date End Date Taking? Authorizing Provider  acetaminophen (TYLENOL) 500 MG tablet Take 1,000 mg by mouth every 6 (six) hours as needed for pain.     [provider]  ALPRAZolam Prudy Feeler) 0.5 MG tablet Take 0.5 mg by mouth 2 (two) times daily as needed for anxiety. 11/01/19   [provider]  ARIPiprazole (ABILIFY) 2 MG tablet Take 2 mg by mouth at bedtime. 11/01/19   [provider]  aspirin EC 81 MG tablet Take 1 tablet (81 mg total) by mouth daily. 03/25/14   Laurey Morale, MD  carvedilol (COREG) 3.125 MG tablet Take 1 tablet (3.125 mg total) by mouth 2 (two) times daily with a meal. Please call for OV [phone] 03/04/18   Bensimhon, Bevelyn Buckles, MD  citalopram (CELEXA) 20 MG tablet Take 40 mg by mouth every morning.      [provider]  clobetasol cream (TEMOVATE) 0.05 % APPLY SMALL AMOUNT AA BID 04/14/19   [provider]  DULoxetine (CYMBALTA) 60 MG capsule Take 60 mg by mouth 2 (two) times daily. 11/01/19   [provider]  furosemide (LASIX) 20 MG tablet Take 20 mg by mouth daily. 11/09/19   [provider]  gabapentin (NEURONTIN) 800 MG tablet Take 800 mg by mouth in the morning, at noon, and at bedtime. 10/11/19   [provider]  glipiZIDE (GLUCOTROL) 10 MG tablet Take 10 mg by mouth daily. 03/19/21   [provider]  HYDROcodone-acetaminophen (NORCO/VICODIN) 5-325 MG tablet Take 1 tablet by mouth every 8 (eight) hours as needed for pain. 01/03/20   [provider]  JARDIANCE 10 MG TABS tablet Take 10 mg by mouth daily. 04/09/21   [provider]  LANTUS SOLOSTAR 100 UNIT/ML Solostar Pen Inject 20 Units into the skin at bedtime. 01/31/21   [provider]  metFORMIN (GLUCOPHAGE-XR) 500 MG 24 hr tablet Take 1,000 mg by mouth 2 (two) times daily. 01/03/20   [provider]  mirabegron ER (MYRBETRIQ) 50 MG TB24 tablet Take 50 mg by mouth daily. 11/09/19   [provider]  naloxone [facility]) nasal spray 4 mg/0.1 mL For suspected opioid overdose, spray 1 mL in each nostril.  Repeat  after 3 minutes if no or minimal response. 02/02/21   [provider]  omeprazole (PRILOSEC) 40 MG capsule Take 40 mg by mouth daily. 04/11/21   [provider]  ondansetron (ZOFRAN-ODT) 4 MG disintegrating tablet Take 4 mg by mouth as needed for nausea/vomiting. 04/09/21   [provider]  ONE TOUCH ULTRA TEST test strip 1 each by Other route as needed for other. 03/25/13   [provider]  sacubitril-valsartan (ENTRESTO) 24-26 MG TAKE 1 TABLET BY MOUTH TWICE DAILY 12/17/21   Baldo Daub, MD  TOVIAZ 4 MG TB24 tablet Take 4 mg by mouth daily. 04/05/21   [provider]      Allergies    Penicillins    Review  of Systems   Review of Systems  Constitutional:  Positive for fatigue. Negative for chills, diaphoresis and fever.  HENT:  Negative for congestion, rhinorrhea and sneezing.   Eyes: Negative.   Respiratory:  Positive for shortness of breath. Negative for cough and chest tightness.   Cardiovascular:  Negative for chest pain and leg swelling.  Gastrointestinal:  Positive for nausea and vomiting (1 episode of vomiting yesterday, no nausea or vomiting since then). Negative for abdominal pain, blood in stool and diarrhea.  Genitourinary:  Positive for frequency. Negative for difficulty urinating, flank pain and hematuria.  Musculoskeletal:  Negative for arthralgias and back pain.  Skin:  Negative for rash.  Neurological:  Negative for dizziness, speech difficulty, weakness, numbness and headaches.   Physical Exam Updated Vital Signs BP (!) 137/94   Pulse (!) 115   Temp 99.8 F (37.7 C) (Rectal)   Resp 17   Ht 5\' 5"  (1.651 m)   Wt 80.7 kg   LMP 02/01/2016   SpO2 94%   BMI 29.62 kg/m  Physical Exam Constitutional:      Appearance: She is well-developed.  HENT:     Head: Normocephalic and atraumatic.  Eyes:     Pupils: Pupils are equal, round, and reactive to light.  Cardiovascular:     Rate and Rhythm: Normal rate and regular rhythm.     Heart sounds: Normal heart sounds.  Pulmonary:     Effort: Pulmonary effort is normal. No respiratory distress.     Breath sounds: Rales (Crackles in the bases) present. No wheezing.  Chest:     Chest wall: No tenderness.  Abdominal:     General: Bowel sounds are normal.     Palpations: Abdomen is soft.     Tenderness: There is no abdominal tenderness. There is no guarding or rebound.  Musculoskeletal:        General: Normal range of motion.     Cervical back: Normal range of motion and neck supple.  Lymphadenopathy:     Cervical: No cervical adenopathy.  Skin:    General: Skin is warm and dry.     Findings: No rash.  Neurological:      Mental Status: She is alert and oriented to person, place, and time.     Comments: Left-sided hemiparesis, some speech deficit.  Per patient, these are changes from her prior stroke and are unchanged from her baseline.    ED Results / Procedures / Treatments   Labs (all labs ordered are listed, but only abnormal results are displayed) Labs Reviewed  BASIC METABOLIC PANEL - Abnormal; Notable for the following components:      Result Value   Sodium 131 (*)    Chloride 97 (*)    Glucose, Bld  412 (*)    Calcium 8.6 (*)    All other components within normal limits  BRAIN NATRIURETIC PEPTIDE - Abnormal; Notable for the following components:   B Natriuretic Peptide 1,603.2 (*)    All other components within normal limits  CBC WITH DIFFERENTIAL/PLATELET - Abnormal; Notable for the following components:   WBC 11.5 (*)    RBC 3.83 (*)    Hemoglobin 10.9 (*)    HCT 32.0 (*)    Neutro Abs 8.4 (*)    All other components within normal limits  LACTIC ACID, PLASMA - Abnormal; Notable for the following components:   Lactic Acid, Venous 2.2 (*)    All other components within normal limits  URINALYSIS, ROUTINE W REFLEX MICROSCOPIC - Abnormal; Notable for the following components:   Glucose, UA >=500 (*)    Hgb urine dipstick TRACE (*)    Leukocytes,Ua MODERATE (*)    All other components within normal limits  URINALYSIS, MICROSCOPIC (REFLEX) - Abnormal; Notable for the following components:   Bacteria, UA FEW (*)    All other components within normal limits  RESP PANEL BY RT-PCR (FLU A&B, COVID) ARPGX2  URINE CULTURE  LACTIC ACID, PLASMA  TROPONIN I (HIGH SENSITIVITY)  TROPONIN I (HIGH SENSITIVITY)    EKG EKG Interpretation  Date/Time:  Saturday February 02 2022 09:41:38 EDT Ventricular Rate:  117 PR Interval:  151 QRS Duration: 86 QT Interval:  349 QTC Calculation: 487 R Axis:   47 Text Interpretation: Sinus tachycardia Borderline prolonged QT interval since last tracing no  significant change Confirmed by Rolan Bucco [phone]) on 02/02/2022 10:32:09 AM  Radiology DG Chest 2 View  Result Date: 02/02/2022 CLINICAL DATA:  Shortness of breath. EXAM: CHEST - 2 VIEW COMPARISON:  Chest radiograph 04/11/2021 FINDINGS: Slight elevation of the right hemidiaphragm. Small linear densities in the lower lungs could represent small Kerley B-lines and mild interstitial edema. No overt pulmonary edema. Heart size is normal. The trachea is midline. No large pleural effusions. Negative for a pneumothorax. IMPRESSION: 1. Mild elevation of the right hemidiaphragm with small linear densities in the lower lungs. Findings could represent atelectasis versus mild interstitial edema. Electronically Signed   By: Richarda Overlie M.D.   On: 02/02/2022 10:21    Procedures Procedures    Medications Ordered in ED Medications  insulin aspart (novoLOG) injection 5 Units (has no administration in time range)  cefTRIAXone (ROCEPHIN) 1 g in sodium chloride 0.9 % 100 mL IVPB (has no administration in time range)  furosemide (LASIX) injection 80 mg (80 mg Intravenous Given 02/02/22 1203)    ED Course/ Medical Decision Making/ A&P                           Medical Decision Making Amount and/or Complexity of Data Reviewed Labs: ordered. Radiology: ordered.  Risk Prescription drug management.   Patient is a 56 year old female who presents with shortness of breath.  Is been progressing over the last 2 to 3 days.  No fevers.  She is noted to be tachycardic in the ED.  Her heart rates in the 110s.  She does not have any hypoxia.  No increased work of breathing at rest.  Chest x-ray shows possible early pulmonary edema.  This was interpreted by me and confirmed by the radiologist.  No infiltrate is noted.  No pneumothorax.  Her BNP is markedly elevated at over 1600.  Her troponins are negative.  There is no ischemic changes  on EKG or suggestions of ACS.  She does have some urinary symptoms and her urine is  suggestive of infection.  She is afebrile but does have an elevated WBC count.  Her lactate is mildly elevated.  Given her tachycardia, elevated WBC count and elevated lactate, she does meet SIRS criteria for sepsis.  She was started on IV Rocephin for her UTI.  I do not see any other suggestions of infection.  She was given a dose of Lasix for diuresis with her CHF.  She is feeling a bit better after that.  Her tachycardia has not improved too much however.  It does appear to be a sinus tachycardia.  Will consult hospitalist for admission.  I spoke with Dr. Chipper Herb who will admit the pt.  Final Clinical Impression(s) / ED Diagnoses Final diagnoses:  Acute on chronic congestive heart failure, unspecified heart failure type (HCC)  Acute cystitis without hematuria  Hyperglycemia  Sepsis without acute organ dysfunction, due to unspecified organism [facility])    Rx / DC Orders ED Discharge Orders     None         Rolan Bucco, MD 02/02/22 1221

## 2022-02-02 NOTE — Progress Notes (Signed)
BG 304. Dr Chipper Herb notified.

## 2022-02-02 NOTE — H&P (Signed)
History and Physical    Margaret Hess DXA:[id number] DOB: [birth year] DOA: 02/02/2022  PCP: Elisabeth Cara, PA-C (Confirm with patient/family/NH records and if not entered, this has to be entered at [facility] point of entry) Patient coming from: Home  I have personally briefly reviewed patient's old medical records in [place]  Chief Complaint: SOB  HPI: Margaret Hess is a 56 y.o. 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  Symptoms started 2 days ago and gradually getting worse.  Initially was exertional dyspnea, but able to tolerate short distance walk but yesterday, even walking inside her house perform bedroom to kitchen triggered significant shortness of breath, and a feeling of cannot take deep breath.  She woke up this morning with severe sensation of congestion in the chest and came to ED.  Denies any cough, no wheezing no fever chills no chest pains.  She does not weigh herself every day, but she did not notice any significant leg swelling, her last cardiology visit was in 2021.  She has chronic interstitial cystitis, following [facility], with frequent UTIs.  She was treated for UTI 2 times this year including 1 episode 2 weeks ago when she finished 1 course of antibiotics as outpatient as well as bladder infusion/irrigation urologist office.  She was started on suppression Macrobid 7 days ago, and she did continue to experience chronic urinary frequency and nocturia but no change of baseline. No pelvic pain, no fever.  Her sugar was 400 today but she does not check her fingerstick at home.  At [facility] ED, patient was found to have mild pulmonary  congestion, was given one-time dose of 80 mg Lasix and patient put out about 2500 mL urine and her breathing symptoms significantly improved.  Review of Systems: As per HPI otherwise 14 point review of systems negative.    Past Medical History:  Diagnosis Date   Acute respiratory failure with hypoxia ([place]) 06/16/2012   Anxiety 06/15/2012   Bipolar disorder, unspecified ([place]) 08/29/2014   Cerebral embolism with cerebral infarction ([place]) 06/20/2012   Formatting of this note might be different from the original. Last Assessment & Plan:  No signs of bleeding, continue coumadin.   Cerebral infarction [facility]) 06/26/2012   Formatting of this note might be different from the original. Last Assessment & Plan:  Continue speech therapy and will restart PT once speech finished.   CHF (congestive heart failure) (HCC)    Chronic combined systolic (congestive) and diastolic (congestive) heart failure ([place]) 06/16/2012   She was seen in the emergency room after [facility] 11/16/2018 with decompensated heart failure.  She was last seen in the heart failure program 03/06/2016 and at that time her ejection fraction was 45 to 50%.  It was not reassessed however her BNP level was significantly elevated from baseline at 724.  Formatting of this note might be different from the original. Last Assessment & Plan:    Chronic systolic heart failure ([place]) 06/15/2012   Formatting of this note might be different from the original. Last Assessment & Plan:  NYHA II. Volume status stable. Continue lasix as needed. Stop digoxin. Cut lisinopril back to 5 mg at bed time. Follow up in 3 months with an ECHO.   Patient seen  and examined with Darrick Grinder, NP. We discussed all aspects of the encounter. I agree with the assessment and plan as stated above. She is much improved   Diabetes mellitus    Diabetes mellitus ([place]) 06/16/2012   Dizziness 02/02/2014   Fatigue 02/02/2014   History of cerebral embolism 06/20/2012   Formatting of  this note might be different from the original. Overview:  Last Assessment & Plan:  No signs of bleeding, continue coumadin.   HTN (hypertension) 06/15/2012   Formatting of this note might be different from the original. Last Assessment & Plan:  Controlled but as above will increase lisinopril 5 mg for afterload reduction.  Last Assessment & Plan:  Controlled but as above will increase lisinopril 5 mg for afterload reduction.   Hypertension    Hypertensive heart disease 06/15/2012   Formatting of this note might be different from the original. Last Assessment & Plan:  Controlled but as above will increase lisinopril 5 mg for afterload reduction.   Long term (current) use of anticoagulants 11/11/2012   Lower urinary tract infectious disease 02/02/2014   Formatting of this note might be different from the original. IMO Problem List Replacer Jan. 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 Last Assessment & Plan:  Will continue coumadin until EF has recovered.  The patient has expressed interest in NOAC agents but there is no indication for NOAC agents in patients with LV thrombus.  Have discussed with the patient and she voices understanding.  Repeat echo in 2 months.   Nonischemic cardiomyopathy ([place]) 06/17/2012   Formatting of this note might be different from the original. Last Assessment & Plan:  EF 35-40% but patient exhibits no signs of HF.  Will continue to titrate HF meds with lisinopril 5 mg BID.  Will follow up in 1 month in hopes to titrate carvedilol.  Will repeat echo in 2 months.  Continue sliding scale lasix.    Last Assessment & Plan:  EF 35-40% but patient exhibits no signs of HF.  Will cont   Palpitations 08/20/2012   Formatting of this note might be different from the original. Last Assessment & Plan:  Daily palpitations. Place Holter monitor for 48 hours.  Attending: I am concerned she may be having NSVT. However  she did not have much ectopy while in the hospital. Will place 48-hour holter. May need to consider ICD if EF not improving.   Positive D dimer 06/16/2012   Pulmonary hypertension ([place]) 06/16/2012   SOB (shortness of breath) 06/15/2012   Stroke [facility])    Stroke, embolic ([place]) 97/74/1423   Formatting of this note might be different from the original. Last Assessment & Plan:  No signs of bleeding, continue coumadin.  Last Assessment & Plan:  No signs of bleeding, continue coumadin.    Past Surgical History:  Procedure Laterality Date   LEFT AND RIGHT HEART CATHETERIZATION WITH CORONARY ANGIOGRAM N/A 06/16/2012   Procedure: LEFT AND RIGHT HEART CATHETERIZATION WITH CORONARY ANGIOGRAM;  Surgeon: Jolaine Artist, MD;  Location: [facility] CATH LAB;  Service: Cardiovascular;  Laterality: N/A;   WISDOM TOOTH EXTRACTION       reports that she has never smoked. She has never used smokeless tobacco. She reports current alcohol use. She reports that she does not use drugs.  Allergies  Allergen Reactions   Penicillins Rash and Other (See Comments)    Tolerates cephalosporins  Family History  Problem Relation Age of Onset   Multiple myeloma Mother    Heart disease Father    Hypertension Father    Stroke Maternal Grandmother      Prior to Admission medications   Medication Sig Start Date End Date Taking? Authorizing Provider  acetaminophen (TYLENOL) 500 MG tablet Take 1,000 mg by mouth every 6 (six) hours as needed for pain.     [provider]  ALPRAZolam Duanne Moron) 0.5 MG tablet Take 0.5 mg by mouth 2 (two) times daily as needed for anxiety. 11/01/19   [provider]  ARIPiprazole (ABILIFY) 2 MG tablet Take 2 mg by mouth at bedtime. 11/01/19   [provider]  aspirin EC 81 MG tablet Take 1 tablet (81 mg total) by mouth daily. 03/25/14   Larey Dresser, MD  carvedilol (COREG) 3.125 MG tablet Take 1 tablet (3.125 mg total) by mouth 2 (two) times daily with a meal. Please  call for OV [phone] 03/04/18   Bensimhon, Shaune Pascal, MD  citalopram (CELEXA) 20 MG tablet Take 40 mg by mouth every morning.     [provider]  clobetasol cream (TEMOVATE) 0.88 % Apply 1 application. topically 2 (two) times daily. 04/14/19   [provider]  DULoxetine (CYMBALTA) 60 MG capsule Take 60 mg by mouth 2 (two) times daily. 11/01/19   [provider]  furosemide (LASIX) 20 MG tablet Take 20 mg by mouth daily. 11/09/19   [provider]  gabapentin (NEURONTIN) 800 MG tablet Take 800 mg by mouth in the morning, at noon, and at bedtime. 10/11/19   [provider]  glipiZIDE (GLUCOTROL) 10 MG tablet Take 10 mg by mouth daily. 03/19/21   [provider]  HYDROcodone-acetaminophen (NORCO/VICODIN) 5-325 MG tablet Take 1 tablet by mouth every 8 (eight) hours as needed for pain. 01/03/20   [provider]  JARDIANCE 10 MG TABS tablet Take 10 mg by mouth daily. 04/09/21   [provider]  LANTUS SOLOSTAR 100 UNIT/ML Solostar Pen Inject 20 Units into the skin at bedtime. 01/31/21   [provider]  metFORMIN (GLUCOPHAGE-XR) 500 MG 24 hr tablet Take 1,000 mg by mouth 2 (two) times daily. 01/03/20   [provider]  mirabegron ER (MYRBETRIQ) 50 MG TB24 tablet Take 50 mg by mouth daily. 11/09/19   [provider]  naloxone [facility]) nasal spray 4 mg/0.1 mL For suspected opioid overdose, spray 1 mL in each nostril.  Repeat after 3 minutes if no or minimal response. 02/02/21   [provider]  omeprazole (PRILOSEC) 40 MG capsule Take 40 mg by mouth daily. 04/11/21   [provider]  ondansetron (ZOFRAN-ODT) 4 MG disintegrating tablet Take 4 mg by mouth as needed for nausea/vomiting. 04/09/21   [provider]  ONE TOUCH ULTRA TEST test strip 1 each by Other route as needed for other. 03/25/13   [provider]  sacubitril-valsartan (ENTRESTO) 24-26 MG TAKE 1 TABLET BY MOUTH TWICE DAILY 12/17/21    Richardo Priest, MD  TOVIAZ 4 MG TB24 tablet Take 4 mg by mouth daily. 04/05/21   [provider]    Physical Exam: Vitals:   02/02/22 1130 02/02/22 1200 02/02/22 1204 02/02/22 1519  BP: (!) 137/94 (!) 146/99  135/87  Pulse: (!) 115 (!) 113  (!) 106  Resp: '17 17  17  ' Temp:   99.8 F (37.7 C) 97.7 F (36.5 C)  TempSrc:   Rectal Oral  SpO2: 94% 94%  94%  Weight:      Height:        Constitutional: NAD, calm, comfortable Vitals:   02/02/22 1130 02/02/22 1200 02/02/22 1204 02/02/22 1519  BP: (!) 137/94 (!) 146/99  135/87  Pulse: (!) 115 (!) 113  (!) 106  Resp: '17 17  17  ' Temp:   99.8 F (37.7 C) 97.7 F (36.5 C)  TempSrc:   Rectal Oral  SpO2: 94% 94%  94%  Weight:      Height:       Eyes: PERRL, lids and conjunctivae normal ENMT: Mucous membranes are moist. Posterior pharynx clear of any exudate or lesions.Normal dentition.  Neck: normal, supple, no masses, no thyromegaly Respiratory: clear to auscultation bilaterally, no wheezing, fine crackles to bilateral lower fields.  Increasing respiratory effort. No accessory muscle use.  Cardiovascular: Regular rate and rhythm, no murmurs / rubs / gallops. No extremity edema. 2+ pedal pulses. No carotid bruits.  Abdomen: no tenderness, no masses palpated. No hepatosplenomegaly. Bowel sounds positive.  Musculoskeletal: no clubbing / cyanosis. No joint deformity upper and lower extremities. Good ROM, no contractures. Normal muscle tone.  Skin: no rashes, lesions, ulcers. No induration Neurologic: CN 2-12 grossly intact. Sensation intact, DTR normal.  Chronic left-sided weakness Psychiatric: Normal judgment and insight. Alert and oriented x 3. Normal mood.     Labs on Admission: I have personally reviewed following labs and imaging studies  CBC: Recent Labs  Lab 02/02/22 1006  WBC 11.5*  NEUTROABS 8.4*  HGB 10.9*  HCT 32.0*  MCV 83.6  PLT 267   Basic Metabolic Panel: Recent Labs  Lab 02/02/22 1006  NA 131*  K  3.9  CL 97*  CO2 23  GLUCOSE 412*  BUN 16  CREATININE 0.75  CALCIUM 8.6*   GFR: Estimated Creatinine Clearance: 83.4 mL/min (by C-G formula based on SCr of 0.75 mg/dL). Liver Function Tests: No results for input(s): AST, ALT, ALKPHOS, BILITOT, PROT, ALBUMIN in the last 168 hours. No results for input(s): LIPASE, AMYLASE in the last 168 hours. No results for input(s): AMMONIA in the last 168 hours. Coagulation Profile: No results for input(s): INR, PROTIME in the last 168 hours. Cardiac Enzymes: No results for input(s): CKTOTAL, CKMB, CKMBINDEX, TROPONINI in the last 168 hours. BNP (last 3 results) No results for input(s): PROBNP in the last 8760 hours. HbA1C: No results for input(s): HGBA1C in the last 72 hours. CBG: Recent Labs  Lab 02/02/22 1537  GLUCAP 304*   Lipid Profile: No results for input(s): CHOL, HDL, LDLCALC, TRIG, CHOLHDL, LDLDIRECT in the last 72 hours. Thyroid Function Tests: No results for input(s): TSH, T4TOTAL, FREET4, T3FREE, THYROIDAB in the last 72 hours. Anemia Panel: No results for input(s): VITAMINB12, FOLATE, FERRITIN, TIBC, IRON, RETICCTPCT in the last 72 hours. Urine analysis:    Component Value Date/Time   COLORURINE YELLOW 02/02/2022 1145   APPEARANCEUR CLEAR 02/02/2022 1145   LABSPEC 1.010 02/02/2022 1145   PHURINE 7.0 02/02/2022 1145   GLUCOSEU >=500 (A) 02/02/2022 1145   HGBUR TRACE (A) 02/02/2022 1145   BILIRUBINUR NEGATIVE 02/02/2022 1145   KETONESUR NEGATIVE 02/02/2022 1145   PROTEINUR NEGATIVE 02/02/2022 1145   UROBILINOGEN 1.0 10/03/2014 1820   NITRITE NEGATIVE 02/02/2022 1145   LEUKOCYTESUR MODERATE (A) 02/02/2022 1145    Radiological Exams on Admission: DG Chest 2 View  Result Date: 02/02/2022 CLINICAL DATA:  Shortness of breath. EXAM: CHEST - 2 VIEW COMPARISON:  Chest radiograph 04/11/2021 FINDINGS: Slight elevation of the right hemidiaphragm. Small linear  densities in the lower lungs could represent small Kerley B-lines and  mild interstitial edema. No overt pulmonary edema. Heart size is normal. The trachea is midline. No large pleural effusions. Negative for a pneumothorax. IMPRESSION: 1. Mild elevation of the right hemidiaphragm with small linear densities in the lower lungs. Findings could represent atelectasis versus mild interstitial edema. Electronically Signed   By: Markus Daft M.D.   On: 02/02/2022 10:21    EKG: Independently reviewed.  Sinus, no acute ST changes, borderline prolonged QTc  Assessment/Plan Principal Problem:   CHF (congestive heart failure) (HCC) Active Problems:   HTN (hypertension)  (please populate well all problems here in Problem List. (For example, if patient is on BP meds at home and you resume or decide to hold them, it is a problem that needs to be her. Same for CAD, COPD, HLD and so on)  Acute on chronic combined systolic and diastolic CHF decompensation -Likely from uncontrolled blood pressure. -Increase chronic from 3.125 mg to 6.25 mg twice daily, continue Entresto -Patient received 80 mg IV Lasix [place] input output 2500 mL urine, but clinically still has signs of fluid overload.  Plan to resume IV diuresis tomorrow, and if patient continues to improve, consider switch to p.o. Lasix and discharged home tomorrow. -Cardiogram -Repeat chest x-ray tomorrow  Uncontrolled diabetes with hyperglycemia -Check A1c -Continue Lantus, hold metformin given there is elevated lactate, change to Januvia, continue Jardiance -Sliding scale  Elevated lactate -No signs of sepsis, she does have fluid overload but no signs of volume depletion.  Suspect the elevated lactate related to metformin.  Metformin on hold.  Uncontrolled hypertension -As above  Chronic interstitial cystitis with recurrent UTIs -Just completed recent UTI treatment about 1 week ago, no significant worsening of her baseline urinary symptoms, UA compared to recent urinalysis showed no significant worsening of pyuria,  patient did receive 1 dose of Rocephin in the ED, but will not continue maintenance antibiotic treatment, instead will resume her Macrobid suppression dose 100 mg daily.  Stroke with residual left-sided weakness -No acute issue,, on aspirin  Bipolar disorder -Multiple SSRI, slightly elevated QTc, repeat EKG tomorrow.  DVT prophylaxis: Subcu Lovenox Code Status: Full code Family Communication: None at bedside Disposition Plan: Expect 1 to 2 days hospital stay for IV diuresis Consults called: None Admission status: Telemetry admission   Lequita Halt MD [facility] Pager [phone]  02/02/2022, 3:57 PM

## 2022-02-02 NOTE — ED Triage Notes (Addendum)
Pt states she has very low energy and heavy breathing for 2 days.  Pt concerned it may be her heart.  No acute respiratory distress noted at present.  Pox 98%  Denies chest pain

## 2022-02-02 NOTE — Progress Notes (Signed)
   02/02/22 1934  Assess: MEWS Score  Temp 98.7 F (37.1 C)  BP (!) 158/101  MAP (mmHg) 117  Pulse Rate (!) 119  ECG Heart Rate (!) 119  Resp 18  SpO2 95 %  O2 Device Room Air  Assess: MEWS Score  MEWS Temp 0  MEWS Systolic 0  MEWS Pulse 2  MEWS RR 0  MEWS LOC 0  MEWS Score 2  MEWS Score Color Yellow  Assess: if the MEWS score is Yellow or Red  Were vital signs taken at a resting state? Yes  Focused Assessment No change from prior assessment  Does the patient meet 2 or more of the SIRS criteria? No  MEWS guidelines implemented *See Row Information* Yes  Treat  MEWS Interventions Administered prn meds/treatments  Pain Scale 0-10  Pain Score 6  Pain Type Chronic pain  Pain Location Back  Pain Orientation Mid;Lower  Pain Radiating Towards denied  Pain Descriptors / Indicators Aching;Sore  Pain Frequency Occasional  Pain Onset Gradual  Patients Stated Pain Goal 2  Pain Intervention(s) Medication (See eMAR);Repositioned (refused tylenol)  Multiple Pain Sites No  Take Vital Signs  Increase Vital Sign Frequency  Yellow: Q 2hr X 2 then Q 4hr X 2, if remains yellow, continue Q 4hrs  Escalate  MEWS: Escalate Yellow: discuss with charge nurse/RN and consider discussing with provider and RRT  Notify: Charge Nurse/RN  Name of Charge Nurse/RN Notified Heather, RN  Date Charge Nurse/RN Notified 02/02/22  Time Charge Nurse/RN Notified 1950  Document  Patient Outcome Other (Comment) (pain meds given. Not in distress.)  Progress note created (see row info) Yes  Assess: SIRS CRITERIA  SIRS Temperature  0  SIRS Pulse 1  SIRS Respirations  0  SIRS WBC 0  SIRS Score Sum  1

## 2022-02-02 NOTE — Progress Notes (Signed)
Sp02= 86%, Asleep. Not in distress. Hooked to supplemental oxygen at 2 lpm via [place]. Sp02= 95%. Will monitor.

## 2022-02-02 NOTE — Progress Notes (Signed)
Pt admitted to 6E AxOx4, VS wnL and as per flow. Pt oriented to 6E processes. Pt familiar with the [facility] system. All questions and concerns addressed. ST on telemetry. 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 Purewick placed per Pt request. Call bell placed within reach, will continue to monitor and maintain safety.

## 2022-02-02 NOTE — ED Notes (Signed)
Attempted to call report to floor.  Nurse will return call once available.

## 2022-02-02 NOTE — ED Notes (Signed)
Attempted IV access to Left upper arm, tol well but unsuccessful

## 2022-02-02 NOTE — Plan of Care (Signed)
  Problem: Cardiac: Goal: Ability to achieve and maintain adequate cardiopulmonary perfusion will improve Outcome: Progressing   Problem: Education: Goal: Knowledge of General Education information will improve Description: Including pain rating scale, medication(s)/side effects and non-pharmacologic comfort measures Outcome: Progressing   Problem: Health Behavior/Discharge Planning: Goal: Ability to manage health-related needs will improve Outcome: Progressing   Problem: Clinical Measurements: Goal: Respiratory complications will improve Outcome: Progressing   Problem: Clinical Measurements: Goal: Cardiovascular complication will be avoided Outcome: Progressing   Problem: Pain Managment: Goal: General experience of comfort will improve Outcome: Progressing   Problem: Safety: Goal: Ability to remain free from injury will improve Outcome: Progressing   Problem: Skin Integrity: Goal: Risk for impaired skin integrity will decrease Outcome: Progressing

## 2022-02-02 NOTE — ED Triage Notes (Signed)
Pt informed MD that she is having weakness and sob x 2 days.

## 2022-02-03 ENCOUNTER — Inpatient Hospital Stay (HOSPITAL_COMMUNITY): Payer: Medicare PPO

## 2022-02-03 DIAGNOSIS — I5021 Acute systolic (congestive) heart failure: Secondary | ICD-10-CM

## 2022-02-03 DIAGNOSIS — I159 Secondary hypertension, unspecified: Secondary | ICD-10-CM | POA: Diagnosis not present

## 2022-02-03 DIAGNOSIS — I5043 Acute on chronic combined systolic (congestive) and diastolic (congestive) heart failure: Secondary | ICD-10-CM | POA: Diagnosis not present

## 2022-02-03 LAB — BASIC METABOLIC PANEL
Anion gap: 10 (ref 5–15)
BUN: 15 mg/dL (ref 6–20)
CO2: 26 mmol/L (ref 22–32)
Calcium: 8.4 mg/dL — ABNORMAL LOW (ref 8.9–10.3)
Chloride: 97 mmol/L — ABNORMAL LOW (ref 98–111)
Creatinine, Ser: 0.74 mg/dL (ref 0.44–1.00)
GFR, Estimated: >60 mL/min (ref >60)
Glucose, Bld: 309 mg/dL — ABNORMAL HIGH (ref 70–99)
Potassium: 3.2 mmol/L — ABNORMAL LOW (ref 3.5–5.1)
Sodium: 133 mmol/L — ABNORMAL LOW (ref 135–145)

## 2022-02-03 LAB — ECHOCARDIOGRAM COMPLETE
Calc EF: 18 %
Height: 65 in
S' Lateral: 4.5 cm
Single Plane A2C EF: 23.8 %
Single Plane A4C EF: 11.7 %
Weight: 2797.2 oz

## 2022-02-03 LAB — MAGNESIUM: Magnesium: 1.7 mg/dL (ref 1.7–2.4)

## 2022-02-03 LAB — URINE CULTURE: Culture: NO GROWTH

## 2022-02-03 LAB — GLUCOSE, CAPILLARY
Glucose-Capillary: 359 mg/dL — ABNORMAL HIGH (ref 70–99)
Glucose-Capillary: 271 mg/dL — ABNORMAL HIGH (ref 70–99)
Glucose-Capillary: 178 mg/dL — ABNORMAL HIGH (ref 70–99)
Glucose-Capillary: 232 mg/dL — ABNORMAL HIGH (ref 70–99)

## 2022-02-03 MED ORDER — MAGNESIUM CHLORIDE 64 MG PO TBEC
2.0000 | DELAYED_RELEASE_TABLET | Freq: Once | ORAL | Status: AC
  Administered 2022-02-03: 128 mg via ORAL
  Filled 2022-02-03: qty 2

## 2022-02-03 MED ORDER — POTASSIUM CHLORIDE CRYS ER 20 MEQ PO TBCR
40.0000 meq | EXTENDED_RELEASE_TABLET | Freq: Once | ORAL | Status: AC
  Administered 2022-02-03: 40 meq via ORAL
  Filled 2022-02-03: qty 2

## 2022-02-03 NOTE — Progress Notes (Signed)
Walked Pt apprx 362ft in the hallway. Pt had no dyspnea on exertion and maintained 95% on RA. Her gait is very unsteady, she is a high risk for falls with left sided weakness s/p CVA and uses a cane for assistance. She leans towards her right (dominant) side while pushing forward. This causes her ortho sneakers to act as a bump stop and she tripped twice during our short walk. I had to pull back on her gown both times to prevent a fall. Her HR increased to & maintained at 128. Pt otherwise asymptomatic and eager for discharge.  Side note: Pt anxious because she has kittens at home that need attention. Family notified.

## 2022-02-03 NOTE — Progress Notes (Signed)
  Echocardiogram 2D Echocardiogram has been performed.  Margaret Hess 02/03/2022, 11:52 AM

## 2022-02-03 NOTE — Plan of Care (Signed)
  Problem: Health Behavior/Discharge Planning: Goal: Ability to identify and utilize available resources and services will improve Outcome: Progressing Goal: Ability to manage health-related needs will improve Outcome: Progressing   Problem: Clinical Measurements: Goal: Respiratory complications will improve Outcome: Progressing   Problem: Clinical Measurements: Goal: Cardiovascular complication will be avoided Outcome: Progressing   Problem: Activity: Goal: Risk for activity intolerance will decrease Outcome: Progressing   Problem: Pain Managment: Goal: General experience of comfort will improve Outcome: Progressing   Problem: Safety: Goal: Ability to remain free from injury will improve Outcome: Progressing   Problem: Skin Integrity: Goal: Risk for impaired skin integrity will decrease Outcome: Progressing

## 2022-02-03 NOTE — Discharge Summary (Signed)
Physician Discharge Summary  Margaret Hess OIN:[id number] DOB: 1966/01/14 DOA: 02/02/2022  PCP: Elisabeth Cara, PA-C  Admit date: 02/02/2022 Discharge date: 02/04/2022  Admitted From: Home Disposition: Home   Recommendations for Outpatient Follow-up:  Follow up with PCP in 1-2 weeks Follow-up with endocrinology as discussed given reinitiation of insulin and need for replacement of continuous blood glucose monitor Follow-up with cardiology/heart failure team as discussed  Home Health: None Equipment/Devices: None  Discharge Condition: Stable CODE STATUS: Full Diet recommendation: Low-salt low-fat fluid restricted low-carb diet as discussed  Brief/Interim Summary: Margaret Hess is a 56 y.o. 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 History complicated by chronic interstitial cystitis s/p recent antibiotic course last week and bladder irrigation.   Patient presents with progressively worsening fatigue, dyspnea with chest tightness. Diuresing well with IV lasix.  Repeat echo shows diminished heart function with ejection fraction down to 20/25% from 40% previously likely secondary to medication and lifestyle noncompliance.  Continue to stress patient's need for fluid restriction, diuretics and blood pressure control.  Patient has ongoing ambulatory dysfunction, questionably acute on chronic over the past 24 hours given ongoing diuresis and improved symptoms patient is able to ambulate today with physical therapy back to baseline, she does have chronic left-sided weakness due to previous CVA but otherwise is stable and agreeable for discharge.  Multiple medication changes as below including reinitiation of insulin given  uncontrolled diabetes with A1c of 8.6, increased blood pressure medications and diuretics in the setting of heart failure decompensation and uncontrolled hypertension.  *Patient needs close follow-up with both PCP and cardiology given her prolonged QTc in the setting of multiple QTc prolonging medications as per list below.  We discussed possibly weaning off some of her medications but this will need to be done slowly as they appear to be chronic bipolar medications.  Discharge Diagnoses:  Principal Problem:   CHF (congestive heart failure) (HCC) Active Problems:   Hypertensive heart disease   Anxiety   Uncontrolled type 2 diabetes mellitus with hyperglycemia, with long-term current use of insulin (HCC)   Chronic combined systolic (congestive) and diastolic (congestive) heart failure (HCC)   HTN (hypertension)   Bipolar disorder ([place])   Cerebral infarction [facility])    Discharge Instructions  Discharge Instructions     (HEART FAILURE PATIENTS) Call MD:  Anytime you have any of the following symptoms: 1) 3 pound weight gain in 24 hours or 5 pounds in 1 week 2) shortness of breath, with or without a dry hacking cough 3) swelling in the hands, feet or stomach 4) if you have to sleep on extra pillows at night in order to breathe.   Complete by: As directed    AMB referral to CHF clinic   Complete by: As directed    Call MD for:   Complete by: As directed    Glucose >300 or <100   Call MD for:  difficulty breathing, headache or visual disturbances   Complete by: As directed    Call MD for:  extreme fatigue   Complete by: As directed    Call MD for:  persistant dizziness or light-headedness   Complete by: As directed    Diet Carb Modified   Complete by: As directed    Increase activity slowly   Complete by: As  directed       Allergies as of 02/04/2022       Reactions   Penicillins Rash, Other (See Comments)   Tolerates cephalosporins        Medication List     STOP taking  these medications    glipiZIDE 10 MG tablet Commonly known as: GLUCOTROL   ONE TOUCH ULTRA TEST test strip Generic drug: glucose blood       TAKE these medications    acetaminophen 500 MG tablet Commonly known as: TYLENOL Take 1,000 mg by mouth every 6 (six) hours as needed for pain.   ALPRAZolam 0.5 MG tablet Commonly known as: XANAX Take 0.5 mg by mouth at bedtime.   amitriptyline 25 MG tablet Commonly known as: ELAVIL Take 25 mg by mouth at bedtime.   ARIPiprazole 2 MG tablet Commonly known as: ABILIFY Take 2 mg by mouth at bedtime.   aspirin EC 81 MG tablet Take 1 tablet (81 mg total) by mouth daily.   blood glucose meter kit and supplies Kit Dispense based on patient and insurance preference. Use up to four times daily as directed.   carvedilol 6.25 MG tablet Commonly known as: COREG Take 1 tablet (6.25 mg total) by mouth 2 (two) times daily with a meal. What changed:  medication strength how much to take additional instructions   citalopram 20 MG tablet Commonly known as: CELEXA Take 40 mg by mouth daily.   clobetasol cream 0.05 % Commonly known as: TEMOVATE Apply 1 application. topically 2 (two) times daily.   DULoxetine 60 MG capsule Commonly known as: CYMBALTA Take 60 mg by mouth 2 (two) times daily.   Entresto 24-26 MG Generic drug: sacubitril-valsartan TAKE 1 TABLET BY MOUTH TWICE DAILY   furosemide 20 MG tablet Commonly known as: Lasix Take 1 tablet (20 mg total) by mouth daily.   HYDROcodone-acetaminophen 5-325 MG tablet Commonly known as: NORCO/VICODIN Take 1 tablet by mouth every 8 (eight) hours as needed for pain.   Lantus SoloStar 100 UNIT/ML Solostar Pen Generic drug: insulin glargine Inject 20 Units into the skin at bedtime.   linagliptin 5 MG Tabs tablet Commonly known as: TRADJENTA Take 1 tablet (5 mg total) by mouth daily. Start taking on: February 05, 2022   metFORMIN 500 MG 24 hr tablet Commonly known as:  GLUCOPHAGE-XR Take 1,000 mg by mouth 2 (two) times daily.   mirabegron ER 50 MG Tb24 tablet Commonly known as: MYRBETRIQ Take 50 mg by mouth daily.   naloxone 4 MG/0.1ML Liqd nasal spray kit Commonly known as: NARCAN For suspected opioid overdose, spray 1 mL in each nostril.  Repeat after 3 minutes if no or minimal response.   nitrofurantoin 50 MG capsule Commonly known as: MACRODANTIN Take 50 mg by mouth daily.   omeprazole 40 MG capsule Commonly known as: PRILOSEC Take 40 mg by mouth daily.   ondansetron 4 MG disintegrating tablet Commonly known as: ZOFRAN-ODT Take 4 mg by mouth every 8 (eight) hours as needed for vomiting or nausea.   oxybutynin 5 MG tablet Commonly known as: DITROPAN Take 1 tablet by mouth daily.   Pen Needles 32G X 4 MM Misc 1 each by Does not apply route 3 (three) times daily before meals.   Toviaz 4 MG Tb24 tablet Generic drug: fesoterodine Take 4 mg by mouth daily.        Follow-up Information     [facility]. Go to.   Specialty: Cardiology Why: [facility]  follow up PLEASE bring current list of medications FREE valet parking, [address]tr[address], [address] Contact information: [address] [id number] [place] [id number] [phone]               Allergies  Allergen Reactions   Penicillins Rash and Other (See Comments)    Tolerates cephalosporins    Consultations: None   Procedures/Studies: DG Chest 2 View  Result Date: 02/03/2022 CLINICAL DATA:  CHF follow-up. EXAM: CHEST - 2 VIEW COMPARISON:  PA Lat yesterday at 10:10 a.m. FINDINGS: PA Lat obtained 5:51 a.m., 02/03/2022. The heart is slightly enlarged. Vascular congestion and interstitial edema noted yesterday is no longer seen. There are trace pleural effusions with improvement. The lungs are clear of infiltrates. Mediastinum is normally outlined. Mild osteopenia. IMPRESSION: Radiographic resolution  of interstitial edema with near resolution of pleural effusions. Electronically Signed   By: Telford Nab M.D.   On: 02/03/2022 07:27   DG Chest 2 View  Result Date: 02/02/2022 CLINICAL DATA:  Shortness of breath. EXAM: CHEST - 2 VIEW COMPARISON:  Chest radiograph 04/11/2021 FINDINGS: Slight elevation of the right hemidiaphragm. Small linear densities in the lower lungs could represent small Kerley B-lines and mild interstitial edema. No overt pulmonary edema. Heart size is normal. The trachea is midline. No large pleural effusions. Negative for a pneumothorax. IMPRESSION: 1. Mild elevation of the right hemidiaphragm with small linear densities in the lower lungs. Findings could represent atelectasis versus mild interstitial edema. Electronically Signed   By: Markus Daft M.D.   On: 02/02/2022 10:21   ECHOCARDIOGRAM COMPLETE  Result Date: 02/03/2022    ECHOCARDIOGRAM REPORT   Patient Name:   SWAYZE KOZUCH [facility] Date of Exam: 02/03/2022 Medical Rec #:  [id number]         Height:       65.0 in Accession #:    [id number]        Weight:       174.8 lb Date of Birth:  [birth year]        BSA:          1.868 m Patient Age:    20 years          BP:           118/71 mmHg Patient Gender: F                 HR:           107 bpm. Exam Location:  Inpatient Procedure: 2D Echo  Results communicated to Dr Avon Gully at 14:17 on 02/03/22. Indications:     acute systolic chf  History:         Patient has prior history of Echocardiogram examinations, most                  recent 10/13/2019. Cardiomyopathy, history of stroke and mural                  LV thrombus., Signs/Symptoms:Dyspnea; Risk Factors:Hypertension                  and Diabetes.  Sonographer:     [place] Referring Phys:  [id number] Lequita Halt Diagnosing Phys: Oswaldo Milian MD IMPRESSIONS  1. Left ventricular ejection fraction, by estimation, is 20 to 25%. The left ventricle has severely decreased function. The left ventricle demonstrates global  hypokinesis. There is mild left ventricular hypertrophy. Left ventricular diastolic parameters  are indeterminate.  2. Right ventricular systolic  function is mildly reduced. The right ventricular size is normal. There is normal pulmonary artery systolic pressure. The estimated right ventricular systolic pressure is 75.1 mmHg.  3. The mitral valve is normal in structure. Mild mitral valve regurgitation.  4. The aortic valve is tricuspid. Aortic valve regurgitation is not visualized. No aortic stenosis is present.  5. The inferior vena cava is normal in size with greater than 50% respiratory variability, suggesting right atrial pressure of 3 mmHg. FINDINGS  Left Ventricle: Left ventricular ejection fraction, by estimation, is 20 to 25%. The left ventricle has severely decreased function. The left ventricle demonstrates global hypokinesis. The left ventricular internal cavity size was normal in size. There is mild left ventricular hypertrophy. Left ventricular diastolic parameters are indeterminate. Right Ventricle: The right ventricular size is normal. No increase in right ventricular wall thickness. Right ventricular systolic function is mildly reduced. There is normal pulmonary artery systolic pressure. The tricuspid regurgitant velocity is 2.27 m/s, and with an assumed right atrial pressure of 3 mmHg, the estimated right ventricular systolic pressure is 70.0 mmHg. Left Atrium: Left atrial size was normal in size. Right Atrium: Right atrial size was normal in size. Pericardium: There is no evidence of pericardial effusion. Mitral Valve: The mitral valve is normal in structure. Mild mitral valve regurgitation. Tricuspid Valve: The tricuspid valve is normal in structure. Tricuspid valve regurgitation is trivial. Aortic Valve: The aortic valve is tricuspid. Aortic valve regurgitation is not visualized. No aortic stenosis is present. Pulmonic Valve: The pulmonic valve was not well visualized. Pulmonic valve  regurgitation is not visualized. Aorta: The aortic root and ascending aorta are structurally normal, with no evidence of dilitation. Venous: The inferior vena cava is normal in size with greater than 50% respiratory variability, suggesting right atrial pressure of 3 mmHg. IAS/Shunts: The interatrial septum was not well visualized.  LEFT VENTRICLE PLAX 2D LVIDd:         5.10 cm      Diastology LVIDs:         4.50 cm      LV e' medial:    6.00 cm/s LV PW:         1.00 cm      LV E/e' medial:  12.3 LV IVS:        0.90 cm      LV e' lateral:   8.93 cm/s LVOT diam:     1.80 cm      LV E/e' lateral: 8.3 LV SV:         31 LV SV Index:   16 LVOT Area:     2.54 cm  LV Volumes (MOD) LV vol d, MOD A2C: 143.0 ml LV vol d, MOD A4C: 137.0 ml LV vol s, MOD A2C: 109.0 ml LV vol s, MOD A4C: 121.0 ml LV SV MOD A2C:     34.0 ml LV SV MOD A4C:     137.0 ml LV SV MOD BP:      25.3 ml RIGHT VENTRICLE            IVC RV S prime:     7.58 cm/s  IVC diam: 1.50 cm TAPSE (M-mode): 0.8 cm LEFT ATRIUM             Index        RIGHT ATRIUM          Index LA diam:        4.10 cm 2.19 cm/m   RA Area:     5.91  cm LA Vol (A2C):   53.6 ml 28.69 ml/m  RA Volume:   8.71 ml  4.66 ml/m LA Vol (A4C):   54.3 ml 29.07 ml/m LA Biplane Vol: 54.7 ml 29.28 ml/m  AORTIC VALVE LVOT Vmax:   88.60 cm/s LVOT Vmean:  59.200 cm/s LVOT VTI:    0.121 m  AORTA Ao Root diam: 2.80 cm Ao Asc diam:  3.20 cm MV E velocity: 73.90 cm/s   TRICUSPID VALVE MV A velocity: 101.00 cm/s  TR Peak grad:   20.6 mmHg MV E/A ratio:  0.73         TR Vmax:        227.00 cm/s                              SHUNTS                             Systemic VTI:  0.12 m                             Systemic Diam: 1.80 cm Oswaldo Milian MD Electronically signed by Oswaldo Milian MD Signature Date/Time: 02/03/2022/2:16:42 PM    Final (Updated)      Subjective: No acute issues or events overnight   Discharge Exam: Vitals:   02/04/22 0834 02/04/22 0946  BP: 112/73 107/85  Pulse:  (!) 115 (!) 122  Resp:  14  Temp:  97.9 F (36.6 C)  SpO2:  99%   Vitals:   02/04/22 0502 02/04/22 0811 02/04/22 0834 02/04/22 0946  BP: 109/69  112/73 107/85  Pulse: 99 (!) 125 (!) 115 (!) 122  Resp: 19   14  Temp: 97.8 F (36.6 C)   97.9 F (36.6 C)  TempSrc: Oral   Oral  SpO2: 94% 99%  99%  Weight:      Height:        General: Pt is alert, awake, not in acute distress Cardiovascular: RRR, S1/S2 +, no rubs, no gallops Respiratory: CTA bilaterally, no wheezing, no rhonchi Abdominal: Soft, NT, ND, bowel sounds + Extremities: no edema, no cyanosis    The results of significant diagnostics from this hospitalization (including imaging, microbiology, ancillary and laboratory) are listed below for reference.     Microbiology: Recent Results (from the past 240 hour(s))  Resp Panel by RT-PCR (Flu A&B, Covid) Anterior Nasal Swab     Status: None   Collection Time: 02/02/22 10:06 AM   Specimen: Anterior Nasal Swab  Result Value Ref Range Status   SARS Coronavirus 2 by RT PCR NEGATIVE NEGATIVE Final    Comment: (NOTE) SARS-CoV-2 target nucleic acids are NOT DETECTED.  The SARS-CoV-2 RNA is generally detectable in upper respiratory specimens during the acute phase of infection. The lowest concentration of SARS-CoV-2 viral copies this assay can detect is 138 copies/mL. A negative result does not preclude SARS-Cov-2 infection and should not be used as the sole basis for treatment or other patient management decisions. A negative result may occur with  improper specimen collection/handling, submission of specimen other than nasopharyngeal swab, presence of viral mutation(s) within the areas targeted by this assay, and inadequate number of viral copies(<138 copies/mL). A negative result must be combined with clinical observations, patient history, and epidemiological information. The expected result is Negative.  Fact Sheet for Patients:   EntrepreneurPulse.com.au  Fact Sheet  for Healthcare Providers:  IncredibleEmployment.be  This test is no t yet approved or cleared by the [place] and  has been authorized for detection and/or diagnosis of SARS-CoV-2 by FDA under an Emergency Use Authorization (EUA). This EUA will remain  in effect (meaning this test can be used) for the duration of the COVID-19 declaration under Section 564(b)(1) of the Act, 21 U.S.C.section 360bbb-3(b)(1), unless the authorization is terminated  or revoked sooner.       Influenza A by PCR NEGATIVE NEGATIVE Final   Influenza B by PCR NEGATIVE NEGATIVE Final    Comment: (NOTE) The Xpert Xpress SARS-CoV-2/FLU/RSV plus assay is intended as an aid in the diagnosis of influenza from Nasopharyngeal swab specimens and should not be used as a sole basis for treatment. Nasal washings and aspirates are unacceptable for Xpert Xpress SARS-CoV-2/FLU/RSV testing.  Fact Sheet for Patients: EntrepreneurPulse.com.au  Fact Sheet for Healthcare Providers: IncredibleEmployment.be  This test is not yet approved or cleared by the [place] FDA and has been authorized for detection and/or diagnosis of SARS-CoV-2 by FDA under an Emergency Use Authorization (EUA). This EUA will remain in effect (meaning this test can be used) for the duration of the COVID-19 declaration under Section 564(b)(1) of the Act, 21 U.S.C. section 360bbb-3(b)(1), unless the authorization is terminated or revoked.  Performed at [facility], [address], [place], [place] [address]   Urine Culture     Status: None   Collection Time: 02/02/22 11:45 AM   Specimen: Urine, Random  Result Value Ref Range Status   Specimen Description   Final    URINE, RANDOM Performed at [facility], [address] [place], [place] [address]    Special Requests   Final    NONE Performed at [facility], [address] [place], [place] [address]    Culture   Final    NO GROWTH Performed at [facility], [place] [address] [place], [place] [address]    Report Status 02/03/2022 FINAL  Final  Blood culture (routine x 2)     Status: None (Preliminary result)   Collection Time: 02/02/22 12:55 PM   Specimen: BLOOD RIGHT HAND  Result Value Ref Range Status   Specimen Description   Final    BLOOD RIGHT HAND Performed at [facility], [address] [place], [place] [address]    Special Requests   Final    BOTTLES DRAWN AEROBIC AND ANAEROBIC Blood Culture adequate volume Performed at [facility], [address] [place], [place] [address]    Culture   Final    NO GROWTH 2 DAYS Performed at [facility], [place] [address] [place], [place] [address]    Report Status PENDING  Incomplete  Blood culture (routine x 2)     Status: None (Preliminary result)   Collection Time: 02/02/22  1:05 PM   Specimen: BLOOD LEFT HAND  Result Value Ref Range Status   Specimen Description   Final    BLOOD LEFT HAND Performed at [facility], [address] [place], [place] [address]    Special Requests   Final    BOTTLES DRAWN AEROBIC AND ANAEROBIC Blood Culture adequate volume Performed at [facility], [address] [place], [place] [address]    Culture   Final    NO GROWTH 2 DAYS Performed at [facility], [address] [place], [place] [address]    Report Status PENDING  Incomplete     Labs: BNP (last 3 results) Recent Labs    02/02/22 1006  BNP 0,175.1*   Basic Metabolic Panel: Recent Labs  Lab 02/02/22 1006 02/03/22 0339 02/04/22 0209  NA 131* 133* 138  K 3.9 3.2* 3.9  CL 97* 97* 99  CO2 _0 GLUCOSE 412* 309* 183*  BUN 16 15 25*  CREATININE 0.75 0.74 1.10*  CALCIUM 8.6* 8.4* 9.4  MG  --  1.7  --    Liver Function Tests: No results for input(s): AST, ALT, ALKPHOS, BILITOT, PROT, ALBUMIN  in the last 168 hours. No results for input(s): LIPASE, AMYLASE in the last 168 hours. No results for input(s): AMMONIA in the last 168 hours. CBC: Recent Labs  Lab 02/02/22 1006  WBC 11.5*  NEUTROABS 8.4*  HGB 10.9*  HCT 32.0*  MCV 83.6  PLT 323   Cardiac Enzymes: No results for input(s): CKTOTAL, CKMB, CKMBINDEX, TROPONINI in the last 168 hours. BNP: Invalid input(s): POCBNP CBG: Recent Labs  Lab 02/03/22 0814 02/03/22 1158 02/03/22 1701 02/03/22 2106 02/04/22 0751  GLUCAP 359* 271* 178* 232* 160*   D-Dimer No results for input(s): DDIMER in the last 72 hours. Hgb A1c Recent Labs    02/02/22 1602  HGBA1C 8.6*   Lipid Profile No results for input(s): CHOL, HDL, LDLCALC, TRIG, CHOLHDL, LDLDIRECT in the last 72 hours. Thyroid function studies No results for input(s): TSH, T4TOTAL, T3FREE, THYROIDAB in the last 72 hours.  Invalid input(s): FREET3 Anemia work up Recent Labs    02/02/22 1618  FERRITIN 24  TIBC 368  IRON 56   Urinalysis    Component Value Date/Time   COLORURINE YELLOW 02/02/2022 [place] 02/02/2022 1145   LABSPEC 1.010 02/02/2022 1145   PHURINE 7.0 02/02/2022 1145   GLUCOSEU >=500 (A) 02/02/2022 1145   HGBUR TRACE (A) 02/02/2022 1145   BILIRUBINUR NEGATIVE 02/02/2022 1145   KETONESUR NEGATIVE 02/02/2022 1145   PROTEINUR NEGATIVE 02/02/2022 1145   UROBILINOGEN 1.0 10/03/2014 1820   NITRITE NEGATIVE 02/02/2022 1145   LEUKOCYTESUR MODERATE (A) 02/02/2022 1145   Sepsis Labs Invalid input(s): PROCALCITONIN,  WBC,  LACTICIDVEN Microbiology Recent Results (from the past 240 hour(s))  Resp Panel by RT-PCR (Flu A&B, Covid) Anterior Nasal Swab     Status: None   Collection Time: 02/02/22 10:06 AM   Specimen: Anterior Nasal Swab  Result Value Ref Range Status   SARS Coronavirus 2 by RT PCR NEGATIVE NEGATIVE Final    Comment: (NOTE) SARS-CoV-2 target nucleic acids are NOT DETECTED.  The SARS-CoV-2 RNA is generally  detectable in upper respiratory specimens during the acute phase of infection. The lowest concentration of SARS-CoV-2 viral copies this assay can detect is 138 copies/mL. A negative result does not preclude SARS-Cov-2 infection and should not be used as the sole basis for treatment or other patient management decisions. A negative result may occur with  improper specimen collection/handling, submission of specimen other than nasopharyngeal swab, presence of viral mutation(s) within the areas targeted by this assay, and inadequate number of viral copies(<138 copies/mL). A negative result must be combined with clinical observations, patient history, and epidemiological information. The expected result is Negative.  Fact Sheet for Patients:  EntrepreneurPulse.com.au  Fact Sheet for Healthcare Providers:  IncredibleEmployment.be  This test is no t yet approved or cleared by the [place] and  has been authorized for  detection and/or diagnosis of SARS-CoV-2 by FDA under an Emergency Use Authorization (EUA). This EUA will remain  in effect (meaning this test can be used) for the duration of the COVID-19 declaration under Section 564(b)(1) of the Act, 21 U.S.C.section 360bbb-3(b)(1), unless the authorization is terminated  or revoked sooner.       Influenza A by PCR NEGATIVE NEGATIVE Final   Influenza B by PCR NEGATIVE NEGATIVE Final    Comment: (NOTE) The Xpert Xpress SARS-CoV-2/FLU/RSV plus assay is intended as an aid in the diagnosis of influenza from Nasopharyngeal swab specimens and should not be used as a sole basis for treatment. Nasal washings and aspirates are unacceptable for Xpert Xpress SARS-CoV-2/FLU/RSV testing.  Fact Sheet for Patients: EntrepreneurPulse.com.au  Fact Sheet for Healthcare Providers: IncredibleEmployment.be  This test is not yet approved or cleared by the [place] FDA  and has been authorized for detection and/or diagnosis of SARS-CoV-2 by FDA under an Emergency Use Authorization (EUA). This EUA will remain in effect (meaning this test can be used) for the duration of the COVID-19 declaration under Section 564(b)(1) of the Act, 21 U.S.C. section 360bbb-3(b)(1), unless the authorization is terminated or revoked.  Performed at [facility], [address] [place], [place] [address]   Urine Culture     Status: None   Collection Time: 02/02/22 11:45 AM   Specimen: Urine, Random  Result Value Ref Range Status   Specimen Description   Final    URINE, RANDOM Performed at [facility], [address] [place], [place] [address]    Special Requests   Final    NONE Performed at [facility], [address] [place], [place] [address]    Culture   Final    NO GROWTH Performed at [facility], [place] [address] [place], [place] [address]    Report Status 02/03/2022 FINAL  Final  Blood culture (routine x 2)     Status: None (Preliminary result)   Collection Time: 02/02/22 12:55 PM   Specimen: BLOOD RIGHT HAND  Result Value Ref Range Status   Specimen Description   Final    BLOOD RIGHT HAND Performed at [facility], [address] [place], [place] [address]    Special Requests   Final    BOTTLES DRAWN AEROBIC AND ANAEROBIC Blood Culture adequate volume Performed at [facility], [address] [place], [place] [address]    Culture   Final    NO GROWTH 2 DAYS Performed at [facility], [place] [address], [place], [place] [address]    Report Status PENDING  Incomplete  Blood culture (routine x 2)     Status: None (Preliminary result)   Collection Time: 02/02/22  1:05 PM   Specimen: BLOOD LEFT HAND  Result Value Ref Range Status   Specimen Description   Final    BLOOD LEFT HAND Performed at [facility], [address] [place], [place] [address]    Special Requests    Final    BOTTLES DRAWN AEROBIC AND ANAEROBIC Blood Culture adequate volume Performed at [facility], [address] [place], [place] [address]    Culture   Final    NO GROWTH 2 DAYS Performed at [facility], [place] [address], [place], [place] [address]    Report Status PENDING  Incomplete     Time coordinating discharge: Over 4  minutes  SIGNED:   Little Ishikawa, DO [facility] 02/04/2022, 3:36 PM Pager   If 7PM-7AM, please contact night-coverage [email or web address]

## 2022-02-03 NOTE — Progress Notes (Signed)
PROGRESS NOTE    Margaret Hess  YHC:[id number] DOB: Mar 20, 1966 DOA: 02/02/2022 PCP: Burnis Medin, PA-C   Brief Narrative:  Margaret Hess is a 56 y.o. 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 History complicated by chronic interstitial cystitis s/p recent antibiotic course last week and bladder irrigation.  Patient presents with progressively worsening fatigue, dyspnea with chest tightness. Diuresing well with IV lasix.  Assessment & Plan:   Principal Problem:   CHF (congestive heart failure) (HCC) Active Problems:   HTN (hypertension)  Acute on chronic combined systolic and diastolic CHF decompensation -Likely from uncontrolled blood pressure. -Increase coreg from 3.125 mg to 6.25 mg twice daily, continue Entresto -Continue IV furosemide - follow I/Os. -Echo - 20-25% with reduced EF from previous echo, will follow up with cardiology outpatient to follow repeat echo/continued medical management.   Uncontrolled diabetes with hyperglycemia -A1C 8.6 - markedly uncontrolled -Continue Lantus, DC metformin - transition to Januvia, continue Jardiance -Sliding scale/hypoglycemic protocol ongoing -Will likely transition to new insulin regimen at discharge given A1C   Elevated lactate -No signs of sepsis, she does have fluid overload but no signs of volume depletion.  Suspect the elevated lactate related to metformin.  Metformin on hold.   Uncontrolled hypertension -Controlled - continue carvedilol, furosemide, entresto as above   Chronic interstitial cystitis with recurrent UTIs -Just completed recent UTI treatment about 1 week ago, no significant worsening of her baseline urinary symptoms, UA compared  to recent urinalysis showed no significant worsening of pyuria, patient did receive 1 dose of Rocephin in the ED, but will not continue maintenance antibiotic treatment, instead will resume her Macrobid suppression dose 100 mg daily.   Stroke with residual left-sided weakness -No acute issue,, on aspirin   Bipolar disorder Prolonged QTc -Multiple SSRI, slightly elevated QTc, repeat EKG tomorrow. -Will need to discuss possible medication changes w/ PCP   DVT prophylaxis: Subcu Lovenox Code Status: Full code Family Communication: None at bedside  Status is: Inpt  Dispo: The patient is from: Home              Anticipated d/c is to: Home              Anticipated d/c date is: 24-48h              Patient currently NOT medically stable for discharge  Consultants:  None  Procedures:  None  Antimicrobials:  Not indicated   Subjective: No acute issues/events overnight, denies ongoing shortness of breath, chest pain, headache, fevers, chills, nausea, or vomiting.  Objective: Vitals:   02/02/22 1934 02/02/22 2149 02/02/22 2352 02/03/22 0421  BP: (!) 158/101 (!) 148/99 133/75 117/70  Pulse: (!) 119 (!) 124 (!) 101 88  Resp: 18 19 19 16   Temp: 98.7 F (37.1 C) 97.7 F (36.5 C) 98.1 F (36.7 C) 98.1 F (36.7 C)  TempSrc: Oral Oral Oral Oral  SpO2: 95% 92% 94% 99%  Weight:    79.3 kg  Height:        Intake/Output Summary (Last 24 hours) at 02/03/2022 0806 Last data filed at 02/03/2022 0527 Gross per 24 hour  Intake 695.58 ml  Output 3200 ml  Net -2504.42 ml   Filed Weights   02/02/22 0938 02/03/22 0421  Weight: 80.7 kg 79.3 kg  Examination:  General exam: Appears calm and comfortable  Respiratory system: Clear to auscultation. Respiratory effort normal. Cardiovascular system: S1 & S2 heard, RRR. No JVD, murmurs, rubs, gallops or clicks. No pedal edema. Gastrointestinal system: Abdomen is nondistended, soft and nontender. No organomegaly or masses felt. Normal bowel  sounds heard. Central nervous system: Alert and oriented. No focal neurological deficits. Extremities: Chronic left sided weakness Skin: No rashes, lesions or ulcers Psychiatry: Judgement and insight appear normal. Mood & affect appropriate.    Data Reviewed: I have personally reviewed following labs and imaging studies  CBC: Recent Labs  Lab 02/02/22 1006  WBC 11.5*  NEUTROABS 8.4*  HGB 10.9*  HCT 32.0*  MCV 83.6  PLT 323   Basic Metabolic Panel: Recent Labs  Lab 02/02/22 1006 02/03/22 0339  NA 131* 133*  K 3.9 3.2*  CL 97* 97*  CO2 23 26  GLUCOSE 412* 309*  BUN 16 15  CREATININE 0.75 0.74  CALCIUM 8.6* 8.4*  MG  --  1.7   GFR: Estimated Creatinine Clearance: 82.7 mL/min (by C-G formula based on SCr of 0.74 mg/dL). Liver Function Tests: No results for input(s): AST, ALT, ALKPHOS, BILITOT, PROT, ALBUMIN in the last 168 hours. No results for input(s): LIPASE, AMYLASE in the last 168 hours. No results for input(s): AMMONIA in the last 168 hours. Coagulation Profile: No results for input(s): INR, PROTIME in the last 168 hours. Cardiac Enzymes: No results for input(s): CKTOTAL, CKMB, CKMBINDEX, TROPONINI in the last 168 hours. BNP (last 3 results) No results for input(s): PROBNP in the last 8760 hours. HbA1C: Recent Labs    02/02/22 1602  HGBA1C 8.6*   CBG: Recent Labs  Lab 02/02/22 1537 02/02/22 2049  GLUCAP 304* 341*   Lipid Profile: No results for input(s): CHOL, HDL, LDLCALC, TRIG, CHOLHDL, LDLDIRECT in the last 72 hours. Thyroid Function Tests: No results for input(s): TSH, T4TOTAL, FREET4, T3FREE, THYROIDAB in the last 72 hours. Anemia Panel: Recent Labs    02/02/22 1618  FERRITIN 24  TIBC 368  IRON 56   Sepsis Labs: Recent Labs  Lab 02/02/22 1006 02/02/22 1200  LATICACIDVEN 2.2* 2.5*    Recent Results (from the past 240 hour(s))  Resp Panel by RT-PCR (Flu A&B, Covid) Anterior Nasal Swab     Status: None   Collection Time: 02/02/22  10:06 AM   Specimen: Anterior Nasal Swab  Result Value Ref Range Status   SARS Coronavirus 2 by RT PCR NEGATIVE NEGATIVE Final    Comment: (NOTE) SARS-CoV-2 target nucleic acids are NOT DETECTED.  The SARS-CoV-2 RNA is generally detectable in upper respiratory specimens during the acute phase of infection. The lowest concentration of SARS-CoV-2 viral copies this assay can detect is 138 copies/mL. A negative result does not preclude SARS-Cov-2 infection and should not be used as the sole basis for treatment or other patient management decisions. A negative result may occur with  improper specimen collection/handling, submission of specimen other than nasopharyngeal swab, presence of viral mutation(s) within the areas targeted by this assay, and inadequate number of viral copies(<138 copies/mL). A negative result must be combined with clinical observations, patient history, and epidemiological information. The expected result is Negative.  Fact Sheet for Patients:  BloggerCourse.com  Fact Sheet for Healthcare Providers:  SeriousBroker.it  This test is no t yet approved or cleared by the [place] FDA and  has been authorized for detection and/or diagnosis of SARS-CoV-2 by FDA under an Emergency Use Authorization (EUA). This EUA will remain  in effect (meaning this test can be used) for the duration of the COVID-19 declaration under Section 564(b)(1) of the Act, 21 U.S.C.section 360bbb-3(b)(1), unless the authorization is terminated  or revoked sooner.       Influenza A by PCR NEGATIVE NEGATIVE Final   Influenza B by PCR NEGATIVE NEGATIVE Final    Comment: (NOTE) The Xpert Xpress SARS-CoV-2/FLU/RSV plus assay is intended as an aid in the diagnosis of influenza from Nasopharyngeal swab specimens and should not be used as a sole basis for treatment. Nasal washings and aspirates are unacceptable for Xpert Xpress  SARS-CoV-2/FLU/RSV testing.  Fact Sheet for Patients: BloggerCourse.com  Fact Sheet for Healthcare Providers: SeriousBroker.it  This test is not yet approved or cleared by the [place] FDA and has been authorized for detection and/or diagnosis of SARS-CoV-2 by FDA under an Emergency Use Authorization (EUA). This EUA will remain in effect (meaning this test can be used) for the duration of the COVID-19 declaration under Section 564(b)(1) of the Act, 21 U.S.C. section 360bbb-3(b)(1), unless the authorization is terminated or revoked.  Performed at [facility], [address] [place], [place] [address]   Blood culture (routine x 2)     Status: None (Preliminary result)   Collection Time: 02/02/22 12:55 PM   Specimen: BLOOD RIGHT HAND  Result Value Ref Range Status   Specimen Description   Final    BLOOD RIGHT HAND Performed at [facility], [address] [place], [place] [address]    Special Requests   Final    BOTTLES DRAWN AEROBIC AND ANAEROBIC Blood Culture adequate volume Performed at [facility], [address] [place], [place] [address]    Culture   Final    NO GROWTH < 24 HOURS Performed at [facility], [address], [place], [place] [address]    Report Status PENDING  Incomplete  Blood culture (routine x 2)     Status: None (Preliminary result)   Collection Time: 02/02/22  1:05 PM   Specimen: BLOOD LEFT HAND  Result Value Ref Range Status   Specimen Description   Final    BLOOD LEFT HAND Performed at [facility], [address] [place], [place] [address]    Special Requests   Final    BOTTLES DRAWN AEROBIC AND ANAEROBIC Blood Culture adequate volume Performed at [facility], [address] [place], [place] [address]    Culture   Final    NO GROWTH < 24 HOURS Performed at [facility], [address], [place], [place] [address]     Report Status PENDING  Incomplete         Radiology Studies: DG Chest 2 View  Result Date: 02/03/2022 CLINICAL DATA:  CHF follow-up. EXAM: CHEST - 2 VIEW COMPARISON:  PA Lat yesterday at 10:10 a.m. FINDINGS: PA Lat obtained 5:51 a.m., 02/03/2022. The heart is slightly enlarged. Vascular congestion and interstitial edema noted yesterday is no longer seen. There are trace pleural effusions with improvement. The lungs are clear of infiltrates. Mediastinum is normally outlined. Mild osteopenia. IMPRESSION: Radiographic resolution of interstitial edema with near resolution of pleural effusions. Electronically Signed   By: Almira Bar M.D.   On: 02/03/2022 07:27   DG Chest 2 View  Result Date: 02/02/2022 CLINICAL DATA:  Shortness of breath. EXAM: CHEST - 2 VIEW COMPARISON:  Chest radiograph 04/11/2021 FINDINGS: Slight elevation of the right hemidiaphragm. Small  linear densities in the lower lungs could represent small Kerley B-lines and mild interstitial edema. No overt pulmonary edema. Heart size is normal. The trachea is midline. No large pleural effusions. Negative for a pneumothorax. IMPRESSION: 1. Mild elevation of the right hemidiaphragm with small linear densities in the lower lungs. Findings could represent atelectasis versus mild interstitial edema. Electronically Signed   By: Richarda Overlie M.D.   On: 02/02/2022 10:21    Scheduled Meds:  ARIPiprazole  2 mg Oral Daily   aspirin EC  81 mg Oral Daily   carvedilol  6.25 mg Oral BID WC   citalopram  40 mg Oral BH-q7a   DULoxetine  60 mg Oral BID   empagliflozin  10 mg Oral Daily   enoxaparin (LOVENOX) injection  40 mg Subcutaneous Q24H   fesoterodine  4 mg Oral Daily   furosemide  40 mg Intravenous Daily   gabapentin  800 mg Oral TID   insulin aspart  0-15 Units Subcutaneous TID WC   insulin aspart  0-5 Units Subcutaneous QHS   insulin glargine-yfgn  20 Units Subcutaneous Daily   linagliptin  5 mg Oral Daily   mirabegron ER  50 mg Oral  Daily   nitrofurantoin (macrocrystal-monohydrate)  100 mg Oral QHS   pantoprazole  40 mg Oral Daily   sacubitril-valsartan  1 tablet Oral BID   sodium chloride flush  3 mL Intravenous Q12H   Continuous Infusions:  sodium chloride 250 mL (02/02/22 1553)     LOS: 1 day   Time spent:  Azucena Fallen, DO [facility]  If 7PM-7AM, please contact night-coverage [email or web address]  02/03/2022, 8:06 AM

## 2022-02-04 ENCOUNTER — Encounter (HOSPITAL_COMMUNITY): Payer: Self-pay | Admitting: Internal Medicine

## 2022-02-04 DIAGNOSIS — Z794 Long term (current) use of insulin: Secondary | ICD-10-CM

## 2022-02-04 DIAGNOSIS — I5043 Acute on chronic combined systolic (congestive) and diastolic (congestive) heart failure: Secondary | ICD-10-CM | POA: Diagnosis not present

## 2022-02-04 DIAGNOSIS — I6310 Cerebral infarction due to embolism of unspecified precerebral artery: Secondary | ICD-10-CM | POA: Diagnosis not present

## 2022-02-04 DIAGNOSIS — F319 Bipolar disorder, unspecified: Secondary | ICD-10-CM | POA: Diagnosis not present

## 2022-02-04 DIAGNOSIS — I5042 Chronic combined systolic (congestive) and diastolic (congestive) heart failure: Secondary | ICD-10-CM

## 2022-02-04 DIAGNOSIS — F419 Anxiety disorder, unspecified: Secondary | ICD-10-CM | POA: Diagnosis not present

## 2022-02-04 DIAGNOSIS — E1165 Type 2 diabetes mellitus with hyperglycemia: Secondary | ICD-10-CM

## 2022-02-04 LAB — BASIC METABOLIC PANEL
Anion gap: 13 (ref 5–15)
BUN: 25 mg/dL — ABNORMAL HIGH (ref 6–20)
CO2: 26 mmol/L (ref 22–32)
Calcium: 9.4 mg/dL (ref 8.9–10.3)
Chloride: 99 mmol/L (ref 98–111)
Creatinine, Ser: 1.1 mg/dL — ABNORMAL HIGH (ref 0.44–1.00)
GFR, Estimated: 59 mL/min — ABNORMAL LOW (ref >60)
Glucose, Bld: 183 mg/dL — ABNORMAL HIGH (ref 70–99)
Potassium: 3.9 mmol/L (ref 3.5–5.1)
Sodium: 138 mmol/L (ref 135–145)

## 2022-02-04 LAB — GLUCOSE, CAPILLARY: Glucose-Capillary: 160 mg/dL — ABNORMAL HIGH (ref 70–99)

## 2022-02-04 MED ORDER — PEN NEEDLES 32G X 4 MM MISC: 1.0000 | Freq: Three times a day (TID) | 0 refills | Status: AC

## 2022-02-04 MED ORDER — CARVEDILOL 6.25 MG PO TABS: 6.2500 mg | ORAL_TABLET | Freq: Two times a day (BID) | ORAL | 1 refills | Status: DC

## 2022-02-04 MED ORDER — FUROSEMIDE 20 MG PO TABS: 20.0000 mg | ORAL_TABLET | Freq: Every day | ORAL | 1 refills | Status: DC

## 2022-02-04 MED ORDER — LANTUS SOLOSTAR 100 UNIT/ML ~~LOC~~ SOPN: 20.0000 [IU] | PEN_INJECTOR | Freq: Every day | SUBCUTANEOUS | Status: AC

## 2022-02-04 MED ORDER — BLOOD GLUCOSE MONITOR KIT: PACK | 0 refills | Status: AC

## 2022-02-04 MED ORDER — LINAGLIPTIN 5 MG PO TABS: 5.0000 mg | ORAL_TABLET | Freq: Every day | ORAL | 1 refills | Status: AC

## 2022-02-04 NOTE — Evaluation (Signed)
Physical Therapy Evaluation/ Discharge Patient Details Name: Margaret Hess MRN: [id number] DOB: Jun 12, 1966 Today's Date: 02/04/2022  History of Present Illness  56 yo female admitted 6/3 with fatigue, dyspnea, CHF. PMhx: CVA with left hemiparesis, CHF, HTN, HTN induced cardiomyopathy, DM, neuropathy, bipolar, chronic cystitis  Clinical Impression  Pt very pleasant with long standing hemiparesis with pt able to don AFO without assist and perform pericare. Pt reports increased stability today after being up and moving and currently functioning at baseline physical level. Pt has rail on bed, cane and needed DME with intermittent assist at home for cleaning. Pt maintains cell phone on her at all times for when she falls and is aware of her deficits but manages well. Pt with assist for clothing this session due to gowns and purewick but not her baseline clothing. Pt without further therapy needs at this time with pt aware and agreeable, will sign off and encouraged continued mobility acutely.    SPOP2 >95% throughout session on rA HR 125 with gait       Recommendations for follow up therapy are one component of a multi-disciplinary discharge planning process, led by the attending physician.  Recommendations may be updated based on patient status, additional functional criteria and insurance authorization.  Follow Up Recommendations No PT follow up    Assistance Recommended at Discharge PRN  Patient can return home with the following  Assistance with cooking/housework    Equipment Recommendations None recommended by PT  Recommendations for Other Services       Functional Status Assessment Patient has not had a recent decline in their functional status     Precautions / Restrictions Precautions Precautions: Fall;Other (comment) Precaution Comments: left hemiparesis walks with AFO      Mobility  Bed Mobility Overal bed mobility: Modified Independent             General bed  mobility comments: with rail, bed flat    Transfers Overall transfer level: Modified independent                 General transfer comment: pt able to rise from bed and toilet with rail without physical assist    Ambulation/Gait Ambulation/Gait assistance: Modified independent (Device/Increase time) Gait Distance (Feet): 300 Feet Assistive device: Straight cane Gait Pattern/deviations: Step-to pattern   Gait velocity interpretation: 1.31 - 2.62 ft/sec, indicative of limited community ambulator   General Gait Details: pt with step to pattern with hemiparetic gait with use of cane, slow and steady without need for physical assist. limited by urge to urinate  Stairs            Wheelchair Mobility    Modified Rankin (Stroke Patients Only)       Balance Overall balance assessment: Needs assistance   Sitting balance-Leahy Scale: Good Sitting balance - Comments: able to don shoes EOB   Standing balance support: Single extremity supported Standing balance-Leahy Scale: Poor Standing balance comment: static standing without assist, cane for gait                             Pertinent Vitals/Pain Pain Assessment Pain Assessment: No/denies pain    Home Living Family/patient expects to be discharged to:: Private residence Living Arrangements: Alone Available Help at Discharge: Friend(s);Available PRN/intermittently Type of Home: House Home Access: Level entry       Home Layout: One level Home Equipment: Cane - single point;Shower seat;Grab bars - toilet;Grab bars - tub/shower  Prior Function Prior Level of Function : Needs assist             Mobility Comments: walks with a cane and AFO, frequent falls ADLs Comments: has intermittent assist for cleaning. pt still drives, cooks and grocery shops     Hand Dominance        Extremity/Trunk Assessment   Upper Extremity Assessment Upper Extremity Assessment: LUE deficits/detail LUE  Deficits / Details: flexion contracture at baseline    Lower Extremity Assessment Lower Extremity Assessment: LLE deficits/detail LLE Deficits / Details: weakness with lack of dorsiflexion and uses AFO    Cervical / Trunk Assessment Cervical / Trunk Assessment: Normal  Communication   Communication: Expressive difficulties  Cognition Arousal/Alertness: Awake/alert Behavior During Therapy: WFL for tasks assessed/performed Overall Cognitive Status: Within Functional Limits for tasks assessed                                          General Comments      Exercises     Assessment/Plan    PT Assessment Patient does not need any further PT services  PT Problem List         PT Treatment Interventions      PT Goals (Current goals can be found in the Care Plan section)  Acute Rehab PT Goals PT Goal Formulation: All assessment and education complete, DC therapy    Frequency       Co-evaluation               AM-PAC PT "6 Clicks" Mobility  Outcome Measure Help needed turning from your back to your side while in a flat bed without using bedrails?: A Little Help needed moving from lying on your back to sitting on the side of a flat bed without using bedrails?: A Little Help needed moving to and from a bed to a chair (including a wheelchair)?: None Help needed standing up from a chair using your arms (e.g., wheelchair or bedside chair)?: None Help needed to walk in hospital room?: None Help needed climbing 3-5 steps with a railing? : A Little 6 Click Score: 21    End of Session Equipment Utilized During Treatment: Gait belt Activity Tolerance: Patient tolerated treatment well Patient left: in chair;with call bell/phone within reach;with chair alarm set Nurse Communication: Mobility status PT Visit Diagnosis: Other abnormalities of gait and mobility (R26.89)    Time: 9892-1194 PT Time Calculation (min) (ACUTE ONLY): 32 min   Charges:   PT  Evaluation $PT Eval Moderate Complexity: 1 Mod PT Treatments $Therapeutic Activity: 8-22 mins        Keosha Rossa P, PT Acute Rehabilitation Services Pager: [phone] Office: [phone]   Tonee Silverstein B Dacoda Spallone 02/04/2022, 8:15 AM

## 2022-02-04 NOTE — Care Management (Signed)
  Transition of Care [facility]) Screening Note   Patient Details  Name: Margaret Hess Date of Birth: Nov 17, 1965   Transition of Care [facility]) CM/SW Contact:    Gala Lewandowsky, RN Phone Number: 02/04/2022, 10:37 AM    Transition of Care Department [facility]) has reviewed the patient and no TOC needs have been identified at this time. Patient is currently trying to find transportation assistance to her car at [facility]. CSW assisting with transportation resources. Patient will not need any durable medical equipment for home. No further needs identified at this time.

## 2022-02-04 NOTE — Progress Notes (Signed)
Heart Failure Nurse Navigator Progress Note  PCP: Piedad Climes, [place], PA-C PCP-Cardiologist: [facility] Admission Diagnosis: Acute on chronic congestive heart failure, Acute cystitis without hematuria, Hyperglycemia, sepsis without organ dysfunction.  Admitted from: Home  Presentation:   [name] presented with shortness of breath, chest tightness, and weakness for 2 days. BP 137/94, HR 115, BNP 1,603, IV lasix given,   Patient was educated on the sign and symptoms of heart failure, she weighs herself daily, and watches her diet. Fluids closely, education was done the importance of taking all her medications as prescribed and attending all scheduled appointments, she verbalized her understanding on education and will attend a HF TOC follow up appointment on 02/18/22 @ 9 am.   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 Last Assessment & Plan:  No signs of bleeding, continue coumadin.   Cerebral infarction [facility]) 06/26/2012   Formatting of this note might be different from the original. Last Assessment & Plan:  Continue speech therapy and will restart PT once speech finished.   CHF (congestive heart failure) (HCC)    Chronic combined systolic (congestive) and diastolic (congestive) heart failure (HCC) 06/16/2012   She was seen in the emergency room after [facility] 11/16/2018 with decompensated heart failure.  She was last seen in the heart failure program 03/06/2016 and at that time her ejection fraction was 45 to 50%.  It was not reassessed however her BNP level was significantly elevated from baseline at 724.  Formatting of this note might be different from the original. Last Assessment  & Plan:    Chronic systolic heart failure (HCC) 06/15/2012   Formatting of this note might be different from the original. Last Assessment & Plan:  NYHA II. Volume status stable. Continue lasix as needed. Stop digoxin. Cut lisinopril back to 5 mg at bed time. Follow up in 3 months with an ECHO.   Patient seen and examined with Tonye Becket, NP. We discussed all aspects of the encounter. I agree with the assessment and plan as stated above. She is much improved   Diabetes mellitus    Diabetes mellitus (HCC) 06/16/2012   Dizziness 02/02/2014   Fatigue 02/02/2014   History of cerebral embolism 06/20/2012   Formatting of this note might be different from the original. Overview:  Last Assessment & Plan:  No signs of bleeding, continue coumadin.   HTN (hypertension) 06/15/2012   Formatting of this note might be different from the original. Last Assessment & Plan:  Controlled but as above will increase lisinopril 5 mg for afterload reduction.  Last Assessment & Plan:  Controlled but as above will increase lisinopril 5 mg for afterload reduction.   Hypertension    Hypertensive heart disease 06/15/2012   Formatting of this note might be different from the original. Last Assessment & Plan:  Controlled but as above will increase lisinopril 5 mg for afterload reduction.   Long term (current) use of anticoagulants 11/11/2012   Lower urinary tract infectious disease 02/02/2014   Formatting of this note might be different from the original. IMO Problem List Replacer Jan. 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 Last Assessment & Plan:  Will continue coumadin until EF has recovered.  The patient has expressed interest in NOAC agents but there is no indication for NOAC agents in patients with LV thrombus.  Have discussed with the patient and she voices understanding.  Repeat echo in 2 months.   Nonischemic cardiomyopathy (HCC) 06/17/2012    Formatting of this note might be different from the original. Last Assessment & Plan:  EF 35-40% but patient exhibits no signs of HF.  Will continue to titrate HF meds with lisinopril 5 mg BID.  Will follow up in 1 month in hopes to titrate carvedilol.  Will repeat echo in 2 months.  Continue sliding scale lasix.    Last Assessment & Plan:  EF 35-40% but patient exhibits no signs of HF.  Will cont   Palpitations 08/20/2012   Formatting of this note might be different from the original. Last Assessment & Plan:  Daily palpitations. Place Holter monitor for 48 hours.  Attending: I am concerned she may be having NSVT. However she did not have much ectopy while in the hospital. Will place 48-hour holter. May need to consider ICD if EF not improving.   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 Last Assessment & Plan:  No signs of bleeding, continue coumadin.  Last Assessment & Plan:  No signs of bleeding, continue coumadin.     Social History   Socioeconomic History   Marital status: Divorced    Spouse name: Not on file   Number of children: 0   Years of education: Not on file   Highest education level: High school graduate  Occupational History    Comment: Disabilty 6 years  Tobacco Use   Smoking status: Never   Smokeless tobacco: Never  Vaping Use   Vaping Use: Never used  Substance and Sexual Activity   Alcohol use: Yes    Comment: 1 mixed drink every 6 months   Drug use: No   Sexual activity: Yes    Birth control/protection: Inserts  Other Topics Concern   Not on file  Social History Narrative   Not on file   Social Determinants of Health   Financial Resource Strain: Medium Risk   Difficulty of Paying Living Expenses: Somewhat hard  Food Insecurity: No Food Insecurity   Worried About Brewing technologist in the Last Year: Never true   Ran Out of Food in the Last Year: Never true  Transportation Needs: No Transportation Needs   Lack of Transportation (Medical): No   Lack of Transportation (Non-Medical): No  Physical Activity: Not on file  Stress: Not on file  Social Connections: Not on file   Education Assessment and Provision:  Detailed education and instructions provided on heart failure disease management including the following:  Signs and symptoms of Heart Failure When to call the physician Importance of daily weights Low sodium diet Fluid restriction Medication management Anticipated future follow-up appointments  Patient education given on each of the above topics.  Patient acknowledges understanding via teach back method and acceptance of all instructions.  Education Materials:  "Living Better With Heart Failure" Booklet, HF zone tool, & Daily Weight Tracker Tool.  Patient has scale at home: Yes Patient has pill box at home: NA    High Risk Criteria  for Readmission and/or Poor Patient Outcomes: Heart failure hospital admissions (last 6 months): 1  No Show rate: 12 Difficult social situation: No Demonstrates medication adherence: Yes Primary Language: English Literacy level: Reading, writing and comprehension.   Barriers of Care:   Left side weakness at times  Considerations/Referrals:   Referral made to Heart Failure Pharmacist Stewardship: yes Referral made to Heart Failure CSW/NCM TOC: No Referral made to Heart & Vascular TOC clinic: yes 02/18/22 @ 9 am.   Items for Follow-up on DC/TOC: New medication Diet/ fluid restrictions   Rhae Hammock, BSN, RN Heart Failure Teacher, adult education Only

## 2022-02-04 NOTE — Progress Notes (Signed)
Heart Failure Stewardship Pharmacist Progress Note   PCP: Burnis Medin, PA-C PCP-Cardiologist: Norman Herrlich, MD    HPI:  56 y.o. 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 History complicated by chronic interstitial cystitis s/p recent antibiotic course last week and bladder irrigation.   Patient presents with progressively worsening fatigue, dyspnea with chest tightness. Diuresing well with IV lasix.  Found to have new EF drop (40>>20-25% 64/2023).  Current HF Medications: Diuretic: furosemide 20 mg daily Beta Blocker: carvedilol 6.25 mg BID ACE/ARB/ARNI: Entresto 24/26 mg BID Aldosterone Antagonist: none SGLT2i: none  Prior to admission HF Medications: Diuretic: none Beta blocker: carvedilol 3.125 mg BID ACE/ARB/ARNI: Entresto 24/26 mg BID Aldosterone Antagonist: none SGLT2i: Jardiance 10 mg daily (pt not taking)  Pertinent Lab Values: Serum creatinine 1.1, BUN 25, Potassium 3.9, Sodium 138, BNP 1603, Magnesium 1.7, A1c 8.6%  Vital Signs: Weight: 159 lbs (admission weight: 178 lbs) Blood pressure: 100-115/70-80  Heart rate: 115  I/O: not well documented  Medication Assistance / Insurance Benefits Check: Does the patient have prescription insurance?  Yes Type of insurance plan: [employer]  Outpatient Pharmacy:  Prior to admission outpatient pharmacy: [facility] Is the patient willing to use [facility] TOC pharmacy at discharge? No, Is the patient willing to transition their outpatient pharmacy to utilize a [facility] outpatient pharmacy?   No  Assessment: 1. Acute on chronic systolic CHF (LVEF 20-25%), due to HTN. NYHA class II symptoms. -Carvedilol increased 3.125 to 6.25 mg BID, HR remains elevated.   -BP on softer side, recommend continuation of Entresto 24/26 mg BID -Patient not taking Jardiance 10 mg daily at home. A1c 8.6% Recommend re-initiation Jardiance 10 mg daily. Discussed with patient she is willing to re-start. She is not sure why it was stopped in the past.  -K 3.9, recommend initiation of spironolactone 12.5 mg daily. Patient was on in 2013 and stopped by provider. She is willing to restart.    Plan: 1) Medication changes recommended at this time: -Continue furosemide 20 mg daily -Continue carvedilol 6.25 mg BID -Continue Entresto 24/26 mg BID -Resume Jardiance 10 mg daily -Start spironolactone 12.5 mg daily  2) Patient assistance: -copay cards  3)  Education  - Patient has been educated on current HF medications and potential additions to HF medication regimen - Patient verbalizes understanding that over the next few months, these medication doses may change and more medications may be added to optimize HF regimen - Patient has been educated on basic disease state pathophysiology and goals of therapy   Drake Leach, PharmD, BCPS PGY2 Cardiology Pharmacy Resident

## 2022-02-04 NOTE — Plan of Care (Signed)
  Problem: Education: Goal: Ability to describe self-care measures that may prevent or decrease complications (Diabetes Survival Skills Education) will improve Outcome: Adequate for Discharge Goal: Individualized Educational Video(s) Outcome: Adequate for Discharge   Problem: Coping: Goal: Ability to adjust to condition or change in health will improve Outcome: Adequate for Discharge   Problem: Fluid Volume: Goal: Ability to maintain a balanced intake and output will improve Outcome: Adequate for Discharge   Problem: Health Behavior/Discharge Planning: Goal: Ability to identify and utilize available resources and services will improve Outcome: Adequate for Discharge Goal: Ability to manage health-related needs will improve Outcome: Adequate for Discharge   Problem: Metabolic: Goal: Ability to maintain appropriate glucose levels will improve Outcome: Adequate for Discharge   Problem: Nutritional: Goal: Maintenance of adequate nutrition will improve Outcome: Adequate for Discharge Goal: Progress toward achieving an optimal weight will improve Outcome: Adequate for Discharge   Problem: Skin Integrity: Goal: Risk for impaired skin integrity will decrease Outcome: Adequate for Discharge   Problem: Tissue Perfusion: Goal: Adequacy of tissue perfusion will improve Outcome: Adequate for Discharge   Problem: Education: Goal: Knowledge of General Education information will improve Description: Including pain rating scale, medication(s)/side effects and non-pharmacologic comfort measures Outcome: Adequate for Discharge   Problem: Health Behavior/Discharge Planning: Goal: Ability to manage health-related needs will improve Outcome: Adequate for Discharge   Problem: Clinical Measurements: Goal: Ability to maintain clinical measurements within normal limits will improve Outcome: Adequate for Discharge Goal: Will remain free from infection Outcome: Adequate for Discharge Goal:  Diagnostic test results will improve Outcome: Adequate for Discharge Goal: Respiratory complications will improve Outcome: Adequate for Discharge Goal: Cardiovascular complication will be avoided Outcome: Adequate for Discharge   Problem: Activity: Goal: Risk for activity intolerance will decrease Outcome: Adequate for Discharge   Problem: Nutrition: Goal: Adequate nutrition will be maintained Outcome: Adequate for Discharge   Problem: Coping: Goal: Level of anxiety will decrease Outcome: Adequate for Discharge   Problem: Elimination: Goal: Will not experience complications related to bowel motility Outcome: Adequate for Discharge Goal: Will not experience complications related to urinary retention Outcome: Adequate for Discharge   Problem: Pain Managment: Goal: General experience of comfort will improve Outcome: Adequate for Discharge   Problem: Safety: Goal: Ability to remain free from injury will improve Outcome: Adequate for Discharge   Problem: Skin Integrity: Goal: Risk for impaired skin integrity will decrease Outcome: Adequate for Discharge   Problem: Cardiac: Goal: Ability to achieve and maintain adequate cardiopulmonary perfusion will improve Outcome: Adequate for Discharge   

## 2022-02-07 LAB — CULTURE, BLOOD (ROUTINE X 2)
Culture: NO GROWTH
Culture: NO GROWTH
Special Requests: ADEQUATE
Special Requests: ADEQUATE

## 2022-02-12 DIAGNOSIS — N3010 Interstitial cystitis (chronic) without hematuria: Secondary | ICD-10-CM

## 2022-02-12 DIAGNOSIS — N3941 Urge incontinence: Secondary | ICD-10-CM | POA: Insufficient documentation

## 2022-02-12 HISTORY — DX: Urge incontinence: N39.41

## 2022-02-12 HISTORY — DX: Interstitial cystitis (chronic) without hematuria: N30.10

## 2022-02-15 ENCOUNTER — Other Ambulatory Visit: Payer: Self-pay | Admitting: Cardiology

## 2022-02-18 ENCOUNTER — Telehealth (HOSPITAL_COMMUNITY): Payer: Self-pay | Admitting: *Deleted

## 2022-02-18 ENCOUNTER — Encounter (HOSPITAL_COMMUNITY): Payer: Medicare PPO

## 2022-02-18 ENCOUNTER — Other Ambulatory Visit (HOSPITAL_COMMUNITY): Payer: Self-pay

## 2022-02-18 NOTE — Telephone Encounter (Signed)
Call attempted to confirm HV TOC appt 9 am on 02/18/22. HIPPA appropriate VM left with callback number.    Rhae Hammock, BSN, Scientist, clinical (histocompatibility and immunogenetics) Only

## 2022-02-19 NOTE — Telephone Encounter (Signed)
Entresto 24-26 mg # 60 only with message patient needs to keep scheduled appointment 04/10/22 with [facility] for future refills.  Sent to  [id number] - [place], [place] - [address]

## 2022-03-29 ENCOUNTER — Telehealth: Payer: Self-pay | Admitting: Cardiology

## 2022-03-29 NOTE — Telephone Encounter (Signed)
Patient states she slide paperwork under the door at the [facility] office on Monday.  She wants to know if they were received as she hasn't heard back.

## 2022-04-09 ENCOUNTER — Other Ambulatory Visit: Payer: Self-pay | Admitting: Cardiology

## 2022-04-09 ENCOUNTER — Other Ambulatory Visit: Payer: Self-pay

## 2022-04-09 DIAGNOSIS — I639 Cerebral infarction, unspecified: Secondary | ICD-10-CM | POA: Insufficient documentation

## 2022-04-09 DIAGNOSIS — G43709 Chronic migraine without aura, not intractable, without status migrainosus: Secondary | ICD-10-CM | POA: Insufficient documentation

## 2022-04-09 DIAGNOSIS — G43909 Migraine, unspecified, not intractable, without status migrainosus: Secondary | ICD-10-CM | POA: Insufficient documentation

## 2022-04-09 HISTORY — DX: Chronic migraine without aura, not intractable, without status migrainosus: G43.709

## 2022-04-10 ENCOUNTER — Encounter: Payer: Self-pay | Admitting: Cardiology

## 2022-04-10 ENCOUNTER — Ambulatory Visit (INDEPENDENT_AMBULATORY_CARE_PROVIDER_SITE_OTHER): Payer: Medicare PPO | Admitting: Cardiology

## 2022-04-10 ENCOUNTER — Ambulatory Visit (INDEPENDENT_AMBULATORY_CARE_PROVIDER_SITE_OTHER): Payer: Medicare PPO

## 2022-04-10 VITALS — BP 100/64 | HR 110 | Ht 66.0 in | Wt 174.1 lb

## 2022-04-10 DIAGNOSIS — R9431 Abnormal electrocardiogram [ECG] [EKG]: Secondary | ICD-10-CM

## 2022-04-10 DIAGNOSIS — I5042 Chronic combined systolic (congestive) and diastolic (congestive) heart failure: Secondary | ICD-10-CM

## 2022-04-10 DIAGNOSIS — I428 Other cardiomyopathies: Secondary | ICD-10-CM | POA: Diagnosis not present

## 2022-04-10 NOTE — Patient Instructions (Signed)
Medication Instructions:  Your physician has recommended you make the following change in your medication:   STOP: Diflucan STOP: Amitriptyline STOP: Celexa  *If you need a refill on your cardiac medications before your next appointment, please call your pharmacy*   Lab Work: Your physician recommends that you return for lab work in:   Labs today: BMP, Pro BNP, Magnesium  If you have labs (blood work) drawn today and your tests are completely normal, you will receive your results only by: MyChart Message (if you have MyChart) OR A paper copy in the mail If you have any lab test that is abnormal or we need to change your treatment, we will call you to review the results.   Testing/Procedures: A zio monitor was ordered today. It will remain on for 7 days. You will then return monitor and event diary in provided box. It takes 1-2 weeks for report to be downloaded and returned to [place]. We will call you with the results. If monitor falls off or has orange flashing light, please call Zio for further instructions.     Follow-Up: At [facility], you and your health needs are our priority.  As part of our continuing mission to provide you with exceptional heart care, we have created designated Provider Care Teams.  These Care Teams include your primary Cardiologist (physician) and Advanced Practice Providers (APPs -  Physician Assistants and Nurse Practitioners) who all work together to provide you with the care you need, when you need it.  We recommend signing up for the patient portal called "MyChart".  Sign up information is provided on this After Visit Summary.  MyChart is used to connect with patients for Virtual Visits (Telemedicine).  Patients are able to view lab/test results, encounter notes, upcoming appointments, etc.  Non-urgent messages can be sent to your provider as well.   To learn more about what you can do with MyChart, go to [email or web address].    Your next  appointment:   6 week(s)  The format for your next appointment:   In Person  Provider:   Dr. Gala Romney   Other Instructions None  Important Information About Sugar

## 2022-04-10 NOTE — Progress Notes (Addendum)
Cardiology Office Note:    Date:  04/10/2022   ID:  Margaret, Hess May 01, 1966, MRN [id number]  PCP:  Elisabeth Cara, PA-C  Cardiologist:  Shirlee More, MD    Referring MD: Belva Bertin [place], *    ASSESSMENT:    1. Nonischemic cardiomyopathy ([place])   2. Chronic combined systolic (congestive) and diastolic (congestive) heart failure ([place])   3. Long QT interval    PLAN:    In order of problems listed above:  Is a very complex difficult case she has had a profound decrease in left ventricular function and recurrent decompensated heart failure since last seen by me over 2 years ago.  I think she needs to be seen again by advanced heart failure I think she is on maximally tolerated medical therapy with blood pressure of 633 systolic she will continue her loop diuretic or beta-blocker low-dose Entresto she is not on MRA I will check an renal function today and unless she has a decreased GFR I will initiate it and I think she needs to get back to be seen in the advanced heart failure program.  I would start vericiquat today except for her borderline blood pressure. Refer back to advanced heart failure Very difficult I do not have a up-to-date list of medications potentially but I am going to go ahead and stop the 3 concerning agents Diflucan amitriptyline and Celexa that she had been taken we will apply a ZIO monitor to see if she is having ventricular tachycardia and will bring her back to the office to do an EKG in 1 week.  EKG today her QT interval is normal 472 she is unsure of the medications she is taking and I am going to stop those drugs regardless. Will plan to see her back in our office in 6 weeks to be sure that she is having follow-up   Next appointment: 6 weeks   Medication Adjustments/Labs and Tests Ordered: Current medicines are reviewed at length with the patient today.  Concerns regarding medicines are outlined above.  No orders of the defined types were  placed in this encounter.  No orders of the defined types were placed in this encounter.   Chief Complaint  Patient presents with   Follow-up   Congestive Heart Failure   Cardiomyopathy    History of Present Illness:    Margaret Hess is a 56 y.o. female with a hx of  heart failure EF 45-50%, DM2, LV thrombus and CVA, hypertensive heart disease   last seen 02/07/2020.Echocardiogram 10/13/2019 showed an ejection fraction of 40% mild right ventricular dysfunction and no significant valvular abnormality.  Her recent echo shows severe left ventricular dysfunction QT interval shows prolonged QTc taking multiple agents including Zofran amitriptyline and citalopram.  Her magnesium is borderline low 2 months ago her potassium was diminished 3.2 and most recently 3.9.  Her cardiomyopathy medications include low-dose Entresto low-dose carvedilol loop diuretic she is not taking SGLT2 inhibitor or MRA. She was discharged in the hospital 02/04/2022 [facility] with a principal diagnosis of heart failure.  She was not seen in consultation by cardiology.  There is notation that she was given follow-up to the heart failure clinic.  EKG 02/03/2022: QTC 550 msecs SRTH prolonged Qtc LVH voltage anterior T wave inversion  Component Ref Range & Units 2 mo ago (02/03/22) 9 yr ago (06/23/12) 9 yr ago (06/20/12)  Magnesium 1.7 - 2.4 mg/dL 1.7  2.1 R  1.8 R  Compliance with diet, lifestyle and medications: I am uncertain as she is uncertain about her medications.  She thinks that everything is the same as when she left the hospital except for oral diabetic medication insulin  Unfortunately her PCP is not in [place]  She has had no lab work since she left the hospital  She weighs every day she said her weight is about the same but did not bring a list she is feeling better and not having edema shortness of breath chest pain palpitation or syncope She is unaware that she had severe left  ventricular dysfunction and QT prolongation She is bring in disability forms to the office  Echo 02/03/2022:  1. Left ventricular ejection fraction, by estimation, is 20 to 25%. The  left ventricle has severely decreased function. The left ventricle  demonstrates global hypokinesis. There is mild left ventricular  hypertrophy. Left ventricular diastolic parameters   are indeterminate.   2. Right ventricular systolic function is mildly reduced. The right  ventricular size is normal. There is normal pulmonary artery systolic  pressure. The estimated right ventricular systolic pressure is 54.2 mmHg.   3. The mitral valve is normal in structure. Mild mitral valve  regurgitation.   4. The aortic valve is tricuspid. Aortic valve regurgitation is not  visualized. No aortic stenosis is present.   5. The inferior vena cava is normal in size with greater than 50%  respiratory variability, suggesting right atrial pressure of 3 mmHg.  Past Medical History:  Diagnosis Date   Acute respiratory failure with hypoxia ([place]) 06/16/2012   AKI (acute kidney injury) ([place]) 04/11/2021   Anxiety 06/15/2012   Bipolar disorder ([place]) 08/29/2014   Cerebral embolism with cerebral infarction ([place]) 06/20/2012   Formatting of this note might be different from the original. Last Assessment & Plan:  No signs of bleeding, continue coumadin.   Cerebral infarction [facility]) 06/26/2012   Formatting of this note might be different from the original. Last Assessment & Plan:  Continue speech therapy and will restart PT once speech finished.   CHF (congestive heart failure) (HCC)    Chronic combined systolic (congestive) and diastolic (congestive) heart failure ([place]) 06/16/2012   She was seen in the emergency room after [facility] 11/16/2018 with decompensated heart failure.  She was last seen in the heart failure program 03/06/2016 and at that time her ejection fraction was 45 to 50%.  It was not reassessed however her BNP  level was significantly elevated from baseline at 724.  Formatting of this note might be different from the original. Last Assessment & Plan:    Chronic interstitial cystitis 03/08/2375   Chronic systolic heart failure ([place]) 06/15/2012   Formatting of this note might be different from the original. Last Assessment & Plan:  NYHA II. Volume status stable. Continue lasix as needed. Stop digoxin. Cut lisinopril back to 5 mg at bed time. Follow up in 3 months with an ECHO.   Patient seen and examined with Darrick Grinder, NP. We discussed all aspects of the encounter. I agree with the assessment and plan as stated above. 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 NICM, EF 40%, carvedilol, Entresto   History of cerebral  embolism 06/20/2012   Formatting of this note might be different from the original. Overview:  Last Assessment & Plan:  No signs of bleeding, continue coumadin.   HTN (hypertension) 06/15/2012   Formatting of this note might be different from the original. Last Assessment & Plan:  Controlled but as above will increase lisinopril 5 mg for afterload reduction.  Last Assessment & Plan:  Controlled but as above will increase lisinopril 5 mg for afterload reduction.   Hypertensive heart disease 06/15/2012   Formatting of this note might be different from the original. Last Assessment & Plan:  Controlled but as above will increase lisinopril 5 mg for afterload reduction.   Long term (current) use of anticoagulants 11/11/2012   Lower urinary tract infectious disease 02/02/2014   Formatting of this note might be different from the original. IMO Problem List Replacer Jan. 2016   Migraines    [place] thrombus of heart  06/16/2012   Formatting of this note might be different from the original. Last Assessment & Plan:  Will continue coumadin until EF has recovered.  The patient has expressed interest in NOAC agents but there is no indication for NOAC agents in patients with LV thrombus.  Have discussed with the patient and she voices understanding.  Repeat echo in 2 months.   Nonischemic cardiomyopathy ([place]) 06/17/2012   Formatting of this note might be different from the original. Last Assessment & Plan:  EF 35-40% but patient exhibits no signs of HF.  Will continue to titrate HF meds with lisinopril 5 mg BID.  Will follow up in 1 month in hopes to titrate carvedilol.  Will repeat echo in 2 months.  Continue sliding scale lasix.    Last Assessment & Plan:  EF 35-40% but patient exhibits no signs of HF.  Will cont   Palpitations 08/20/2012   Formatting of this note might be different from the original. Last Assessment & Plan:  Daily palpitations. Place Holter monitor for 48 hours.  Attending: I am concerned she may be having NSVT. However she did not have much ectopy while in the hospital. Will place 48-hour holter. May need to consider ICD if EF not improving.   Positive D dimer 06/16/2012   Primary hypertension 06/15/2012   Formatting of this note might be different from the original. Last Assessment & Plan:  Controlled but as above will increase lisinopril 5 mg for afterload reduction.  Last Assessment & Plan:  Controlled but as above will increase lisinopril 5 mg for afterload reduction.  Formatting of this note might be different from the original. Formatting of this note might be different from the original. Form   Pulmonary hypertension ([place]) 06/16/2012   Recurrent UTI 05/22/2021   Formatting of this note might be different from the original. 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 Last Assessment & Plan:  No signs of bleeding, continue coumadin.  Last Assessment & Plan:  No signs of bleeding, continue coumadin.   Uncontrolled type 2 diabetes mellitus with hyperglycemia ([place]) 02/05/2021   Uncontrolled type 2 diabetes mellitus with hyperglycemia, with long-term current use of insulin ([place]) 06/16/2012   Urge urinary incontinence 02/12/2022    Past Surgical History:  Procedure  Laterality Date   LEFT AND RIGHT HEART CATHETERIZATION WITH CORONARY ANGIOGRAM N/A 06/16/2012   Procedure: LEFT AND RIGHT HEART CATHETERIZATION WITH CORONARY ANGIOGRAM;  Surgeon: Jolaine Artist, MD;  Location: [facility] CATH LAB;  Service: Cardiovascular;  Laterality: N/A;   WISDOM TOOTH EXTRACTION      Current Medications: Current Meds  Medication Sig   acetaminophen (TYLENOL) 500 MG tablet Take 1,000 mg by mouth every 6 (six) hours as needed for pain.    ALPRAZolam (XANAX) 0.5 MG tablet Take 0.5 mg by mouth at bedtime.   amitriptyline (ELAVIL) 25 MG tablet Take 25 mg by mouth at bedtime.   ARIPiprazole (ABILIFY) 2 MG tablet Take 2 mg by mouth at bedtime.   aspirin EC 81 MG tablet Take 1 tablet (81 mg total) by mouth daily.   atorvastatin (LIPITOR) 20 MG tablet Take 20 mg by mouth daily.   blood glucose meter kit and supplies KIT Dispense based on patient and insurance preference. Use up to four times daily as directed.   carvedilol (COREG) 6.25 MG tablet Take 1 tablet (6.25 mg total) by mouth 2 (two) times daily with a meal.   cephALEXin (KEFLEX) 250 MG capsule Take 250 mg by mouth daily.   citalopram (CELEXA) 20 MG tablet Take 40 mg by mouth daily.   clobetasol cream (TEMOVATE) 7.20 % Apply 1 application. topically 2 (two) times daily.   DULoxetine (CYMBALTA) 60 MG capsule Take 60 mg by mouth 2 (two) times daily.   fluconazole (DIFLUCAN) 200 MG tablet  Take 200 mg by mouth daily.   furosemide (LASIX) 20 MG tablet Take 20 mg by mouth daily.   HYDROcodone-acetaminophen (NORCO/VICODIN) 5-325 MG tablet Take 1 tablet by mouth every 8 (eight) hours as needed for pain.   LANTUS SOLOSTAR 100 UNIT/ML Solostar Pen Inject 20 Units into the skin at bedtime.   linagliptin (TRADJENTA) 5 MG TABS tablet Take 1 tablet (5 mg total) by mouth daily.   metFORMIN (GLUCOPHAGE-XR) 500 MG 24 hr tablet Take 1,000 mg by mouth 2 (two) times daily.   mirabegron ER (MYRBETRIQ) 50 MG TB24 tablet Take 50 mg by mouth daily.   naloxone (NARCAN) nasal spray 4 mg/0.1 mL For suspected opioid overdose, spray 1 mL in each nostril.  Repeat after 3 minutes if no or minimal response.   nitrofurantoin (MACRODANTIN) 50 MG capsule Take 50 mg by mouth daily.   omeprazole (PRILOSEC) 40 MG capsule Take 40 mg by mouth daily.   ondansetron (ZOFRAN-ODT) 4 MG disintegrating tablet Take 4 mg by mouth every 8 (eight) hours as needed for vomiting or nausea.   oxybutynin (DITROPAN) 5 MG tablet Take 1 tablet by mouth daily.   sacubitril-valsartan (ENTRESTO) 24-26 MG TAKE 1 TABLET BY MOUTH TWICE DAILY   TOVIAZ 4 MG TB24 tablet Take 4 mg by mouth daily.     Allergies:   Penicillins   Social History   Socioeconomic History   Marital status: Divorced    Spouse name: Not on file   Number of children: 0   Years of education: Not on file   Highest education level: High school graduate  Occupational History    Comment: Disabilty 6 years  Tobacco Use   Smoking status: Never   Smokeless tobacco: Never  Vaping Use   Vaping Use: Never used  Substance and Sexual Activity   Alcohol use: Yes    Comment: 1 mixed drink every 6 months   Drug use: No   Sexual activity: Yes  Birth control/protection: Inserts  Other Topics Concern   Not on file  Social History Narrative   Not on file   Social Determinants of Health   Financial Resource Strain: Medium Risk (02/04/2022)   Overall Financial  Resource Strain (CARDIA)    Difficulty of Paying Living Expenses: Somewhat hard  Food Insecurity: No Food Insecurity (02/04/2022)   Hunger Vital Sign    Worried About Running Out of Food in the Last Year: Never true    Ran Out of Food in the Last Year: Never true  Transportation Needs: No Transportation Needs (02/04/2022)   PRAPARE - Hydrologist (Medical): No    Lack of Transportation (Non-Medical): No  Physical Activity: Not on file  Stress: Not on file  Social Connections: Not on file     Family History: The patient's family history includes Heart disease in her father; Hypertension in her father; Multiple myeloma in her mother; Stroke in her maternal grandmother. ROS:   Please see the history of present illness.    All other systems reviewed and are negative.  EKGs/Labs/Other Studies Reviewed:    The following studies were reviewed today:  EKG:  EKG ordered today and personally reviewed.  The ekg ordered today demonstrates sinus rhythm QT interval is normal EKG pattern of old inferior MI.  Recent Labs: 04/14/2021: ALT 12 02/02/2022: B Natriuretic Peptide 1,603.2; Hemoglobin 10.9; Platelets 323 02/03/2022: Magnesium 1.7 02/04/2022: BUN 25; Creatinine, Ser 1.10; Potassium 3.9; Sodium 138  Recent Lipid Panel    Component Value Date/Time   CHOL 121 06/17/2012 0455   TRIG 169 (H) 06/17/2012 0455   HDL 23 (L) 06/17/2012 0455   CHOLHDL 5.3 06/17/2012 0455   VLDL 34 06/17/2012 0455   LDLCALC 64 06/17/2012 0455    Physical Exam:    VS:  BP 100/64   Pulse (!) 110   Ht '5\' 6"'  (1.676 m)   Wt 174 lb 1.3 oz (79 kg)   LMP 02/01/2016   SpO2 99%   BMI 28.10 kg/m     Wt Readings from Last 3 Encounters:  04/10/22 174 lb 1.3 oz (79 kg)  02/04/22 159 lb 8 oz (72.3 kg)  04/14/21 160 lb 7.9 oz (72.8 kg)     GEN:  Well nourished, well developed in no acute distress HEENT: Normal NECK: No JVD; No carotid bruits LYMPHATICS: No lymphadenopathy CARDIAC: No S3  RRR, no murmurs, rubs, gallops RESPIRATORY:  Clear to auscultation without rales, wheezing or rhonchi  ABDOMEN: Soft, non-tender, non-distended MUSCULOSKELETAL:  No edema; No deformity  SKIN: Warm and dry NEUROLOGIC:  Alert and oriented x 3 PSYCHIATRIC:  Normal affect    Signed, Shirlee More, MD  04/10/2022 4:34 PM    [facility]

## 2022-04-13 LAB — BASIC METABOLIC PANEL
BUN/Creatinine Ratio: 25 — ABNORMAL HIGH (ref 9–23)
BUN: 18 mg/dL (ref 6–24)
CO2: 19 mmol/L — ABNORMAL LOW (ref 20–29)
Calcium: 9.2 mg/dL (ref 8.7–10.2)
Chloride: 102 mmol/L (ref 96–106)
Creatinine, Ser: 0.73 mg/dL (ref 0.57–1.00)
Glucose: 217 mg/dL — ABNORMAL HIGH (ref 70–99)
Potassium: 4.2 mmol/L (ref 3.5–5.2)
Sodium: 137 mmol/L (ref 134–144)
eGFR: 97 mL/min/{1.73_m2} (ref >59)

## 2022-04-13 LAB — PRO B NATRIURETIC PEPTIDE: NT-Pro BNP: 2058 pg/mL — ABNORMAL HIGH (ref 0–287)

## 2022-04-13 LAB — MAGNESIUM: Magnesium: 1.6 mg/dL (ref 1.6–2.3)

## 2022-04-14 DIAGNOSIS — M159 Polyosteoarthritis, unspecified: Secondary | ICD-10-CM

## 2022-04-14 DIAGNOSIS — M150 Primary generalized (osteo)arthritis: Secondary | ICD-10-CM

## 2022-04-14 HISTORY — DX: Polyosteoarthritis, unspecified: M15.9

## 2022-04-14 HISTORY — DX: Primary generalized (osteo)arthritis: M15.0

## 2022-04-22 ENCOUNTER — Telehealth: Payer: Self-pay | Admitting: Cardiology

## 2022-04-22 MED ORDER — FUROSEMIDE 20 MG PO TABS: 20.0000 mg | ORAL_TABLET | Freq: Every day | ORAL | 3 refills | Status: AC

## 2022-04-22 NOTE — Telephone Encounter (Signed)
*  STAT* If patient is at the pharmacy, call can be transferred to refill team.   1. Which medications need to be refilled? (please list name of each medication and dose if known)   furosemide (LASIX) 20 MG tablet    2. Which pharmacy/location (including street and city if local pharmacy) is medication to be sent to? [id number] - [place], [place] - [address]  3. Do they need a 30 day or 90 day supply? 90

## 2022-06-06 IMAGING — CR DG CHEST 2V
2 series · 2 of 2 positions shown · non-contrast
Comparison: PA Lat yesterday at [DATE] a.m.

CLINICAL DATA: CHF follow-up.

EXAM:
CHEST - 2 VIEW

[chest pa]
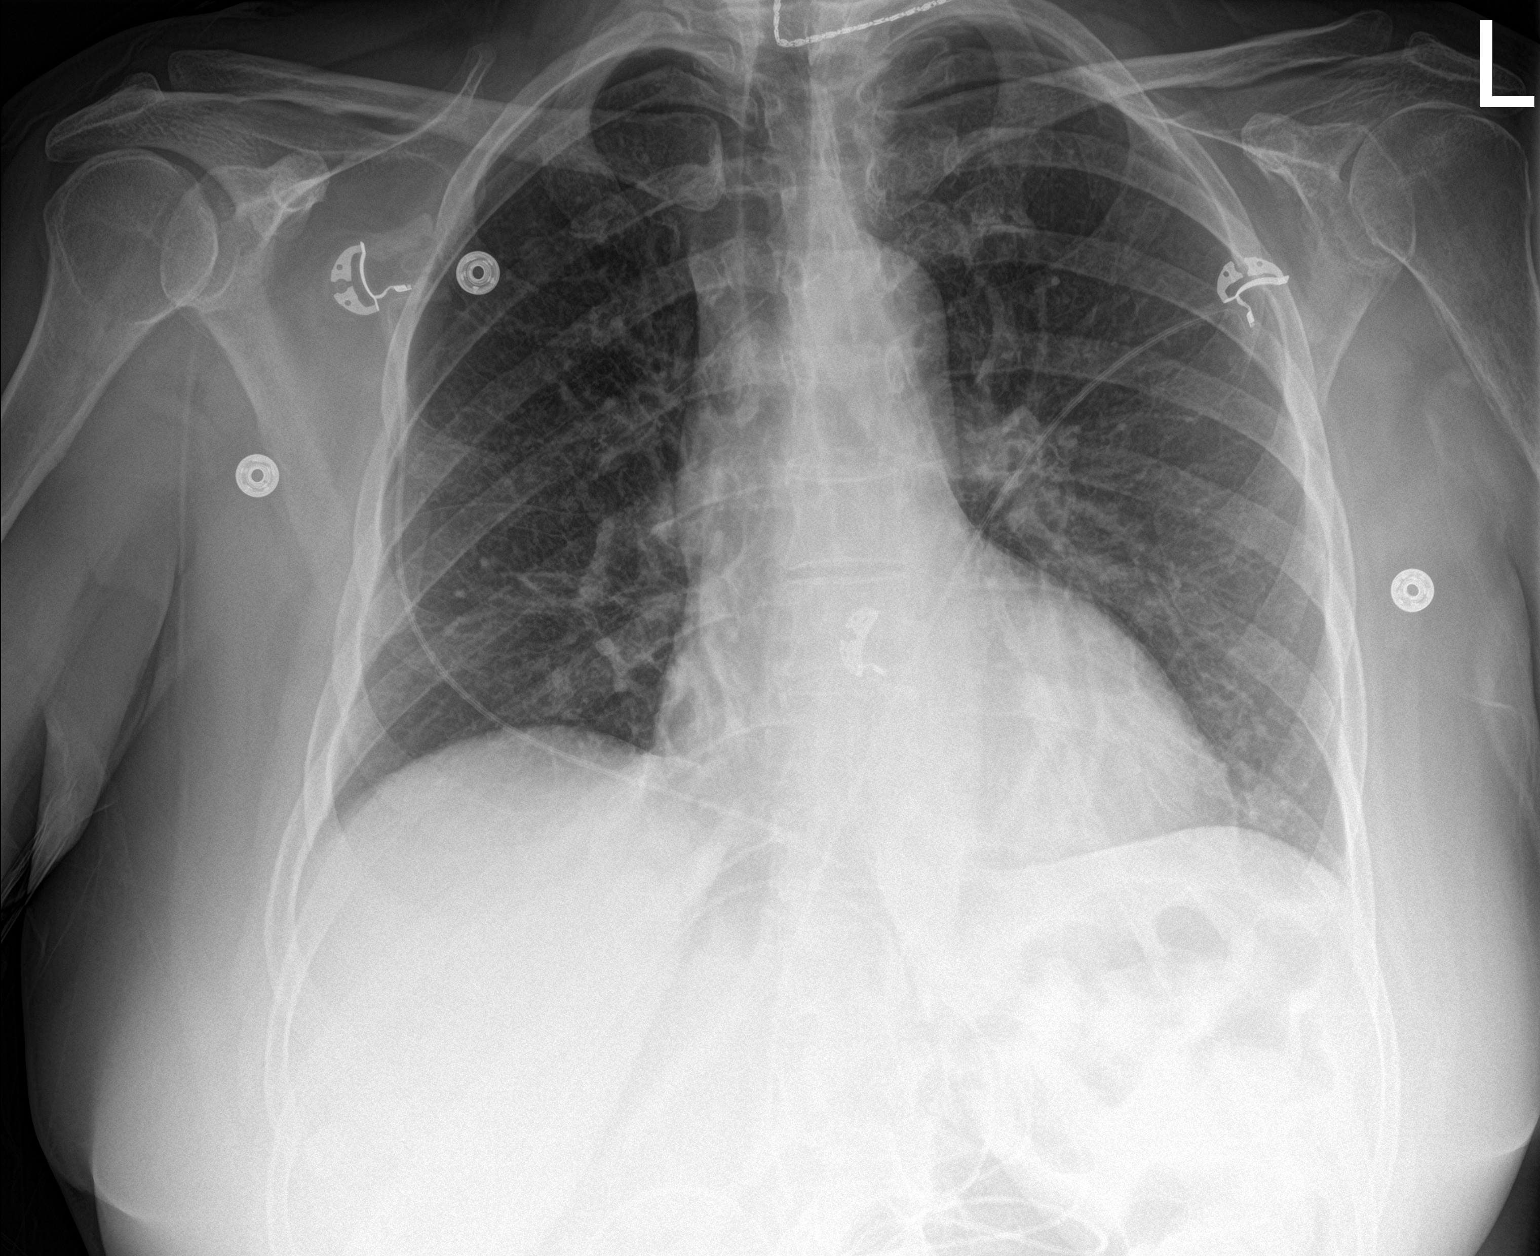

[chest lat]
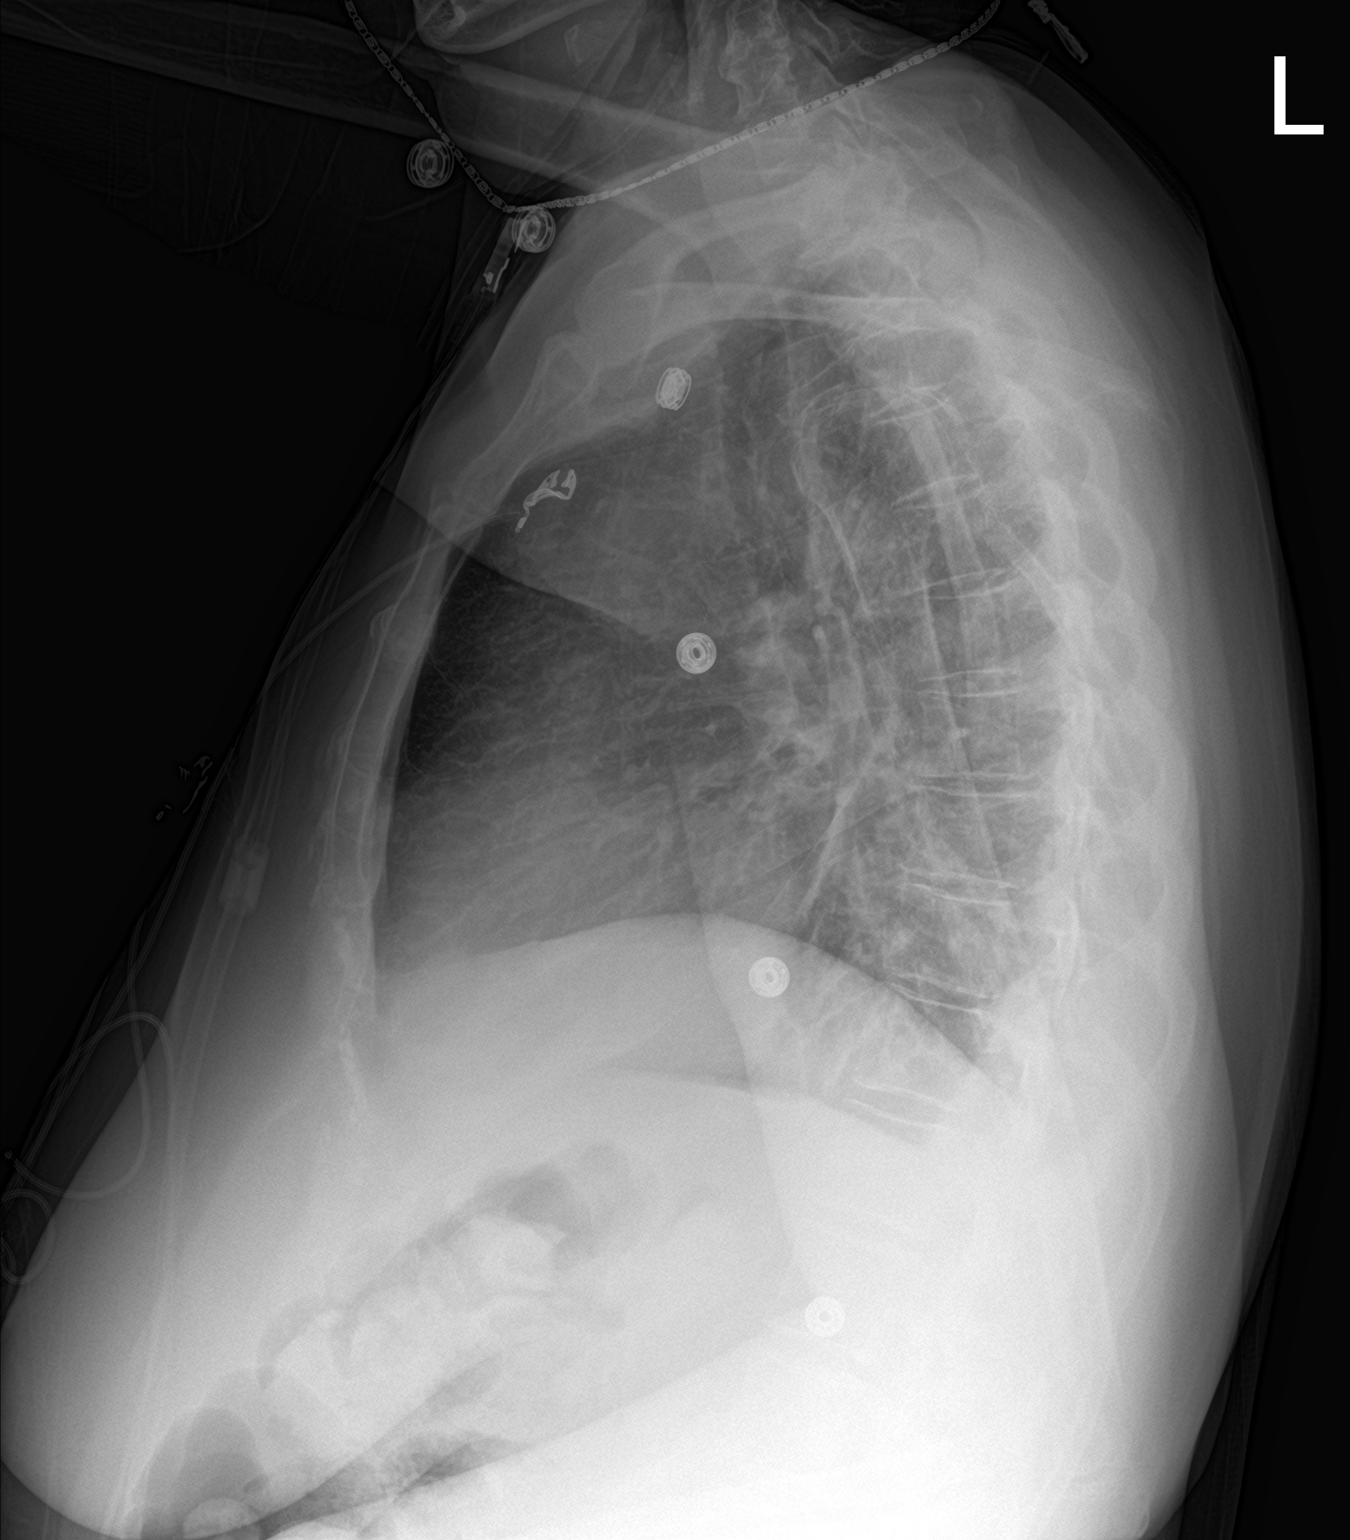

[2 of 2 positions shown; findings below may reference images not displayed]

FINDINGS: PA Lat obtained [DATE] a.m., 02/03/2022. The heart is slightly
enlarged.

Vascular congestion and interstitial edema noted yesterday is no
longer seen. There are trace pleural effusions with improvement.

The lungs are clear of infiltrates. Mediastinum is normally
outlined. Mild osteopenia.
IMPRESSION: Radiographic resolution of interstitial edema with near resolution
of pleural effusions.

## 2022-06-13 ENCOUNTER — Telehealth: Payer: Self-pay | Admitting: Cardiology

## 2022-06-13 NOTE — Telephone Encounter (Signed)
Copies of the patients OV notes, EKG and diagnostic test were faxed to [place] at [place].

## 2022-06-13 NOTE — Telephone Encounter (Signed)
  Margaret Hess - [employer] calling they need copies of pt's OV notes, EKG and diagnostic test that will support pt's zio monitor for insurance claim purposes. She gave fax# [phone]

## 2022-06-18 NOTE — Telephone Encounter (Signed)
Results faxed to [phone]

## 2022-06-18 NOTE — Telephone Encounter (Signed)
Margaret Hess called asking about medical records. Informed her they were faxed already but she states they have not received yet, confirmed that she said the fax is the same number

## 2022-06-18 NOTE — Telephone Encounter (Signed)
Records refax to [place], I reach out to [place] team and informed documents refaxed

## 2022-07-30 ENCOUNTER — Other Ambulatory Visit: Payer: Self-pay

## 2022-07-30 ENCOUNTER — Emergency Department (HOSPITAL_BASED_OUTPATIENT_CLINIC_OR_DEPARTMENT_OTHER)
Admission: EM | Admit: 2022-07-30 | Discharge: 2022-07-30 | Disposition: A | Discharge status: Home / Self Care | Payer: Medicare PPO | Attending: Emergency Medicine | Admitting: Emergency Medicine

## 2022-07-30 ENCOUNTER — Encounter (HOSPITAL_BASED_OUTPATIENT_CLINIC_OR_DEPARTMENT_OTHER): Payer: Self-pay

## 2022-07-30 ENCOUNTER — Emergency Department (HOSPITAL_BASED_OUTPATIENT_CLINIC_OR_DEPARTMENT_OTHER): Payer: Medicare PPO

## 2022-07-30 DIAGNOSIS — R519 Headache, unspecified: Secondary | ICD-10-CM | POA: Diagnosis not present

## 2022-07-30 DIAGNOSIS — Z79899 Other long term (current) drug therapy: Secondary | ICD-10-CM | POA: Insufficient documentation

## 2022-07-30 DIAGNOSIS — I110 Hypertensive heart disease with heart failure: Secondary | ICD-10-CM | POA: Insufficient documentation

## 2022-07-30 DIAGNOSIS — Z7982 Long term (current) use of aspirin: Secondary | ICD-10-CM | POA: Insufficient documentation

## 2022-07-30 DIAGNOSIS — E1140 Type 2 diabetes mellitus with diabetic neuropathy, unspecified: Secondary | ICD-10-CM | POA: Diagnosis not present

## 2022-07-30 DIAGNOSIS — Z7984 Long term (current) use of oral hypoglycemic drugs: Secondary | ICD-10-CM | POA: Diagnosis not present

## 2022-07-30 DIAGNOSIS — I509 Heart failure, unspecified: Secondary | ICD-10-CM | POA: Diagnosis not present

## 2022-07-30 DIAGNOSIS — Z8669 Personal history of other diseases of the nervous system and sense organs: Secondary | ICD-10-CM

## 2022-07-30 DIAGNOSIS — I10 Essential (primary) hypertension: Secondary | ICD-10-CM

## 2022-07-30 LAB — CBC WITH DIFFERENTIAL/PLATELET
Abs Immature Granulocytes: 0.03 10*3/uL (ref 0.00–0.07)
Basophils Absolute: 0.1 10*3/uL (ref 0.0–0.1)
Basophils Relative: 1 %
Eosinophils Absolute: 0.2 10*3/uL (ref 0.0–0.5)
Eosinophils Relative: 3 %
HCT: 34.3 % — ABNORMAL LOW (ref 36.0–46.0)
Hemoglobin: 11.1 g/dL — ABNORMAL LOW (ref 12.0–15.0)
Immature Granulocytes: 0 %
Lymphocytes Relative: 35 %
Lymphs Abs: 3.1 10*3/uL (ref 0.7–4.0)
MCH: 26.9 pg (ref 26.0–34.0)
MCHC: 32.4 g/dL (ref 30.0–36.0)
MCV: 83.3 fL (ref 80.0–100.0)
Monocytes Absolute: 0.6 10*3/uL (ref 0.1–1.0)
Monocytes Relative: 7 %
Neutro Abs: 5 10*3/uL (ref 1.7–7.7)
Neutrophils Relative %: 54 %
Platelets: 382 10*3/uL (ref 150–400)
RBC: 4.12 MIL/uL (ref 3.87–5.11)
RDW: 13.2 % (ref 11.5–15.5)
WBC: 9 10*3/uL (ref 4.0–10.5)
nRBC: 0 % (ref 0.0–0.2)

## 2022-07-30 LAB — BASIC METABOLIC PANEL
Anion gap: 16 — ABNORMAL HIGH (ref 5–15)
BUN: 24 mg/dL — ABNORMAL HIGH (ref 6–20)
CO2: 24 mmol/L (ref 22–32)
Calcium: 9.2 mg/dL (ref 8.9–10.3)
Chloride: 95 mmol/L — ABNORMAL LOW (ref 98–111)
Creatinine, Ser: 0.93 mg/dL (ref 0.44–1.00)
GFR, Estimated: >60 mL/min (ref >60)
Glucose, Bld: 247 mg/dL — ABNORMAL HIGH (ref 70–99)
Potassium: 3.4 mmol/L — ABNORMAL LOW (ref 3.5–5.1)
Sodium: 135 mmol/L (ref 135–145)

## 2022-07-30 MED ORDER — SODIUM CHLORIDE 0.9 % IV BOLUS: 500.0000 mL | Freq: Once | INTRAVENOUS | Status: DC

## 2022-07-30 MED ORDER — SODIUM CHLORIDE 0.9 % IV BOLUS
500.0000 mL | Freq: Once | INTRAVENOUS | Status: AC
  Administered 2022-07-30: 500 mL via INTRAVENOUS

## 2022-07-30 MED ORDER — DIPHENHYDRAMINE HCL 50 MG/ML IJ SOLN
25.0000 mg | Freq: Once | INTRAMUSCULAR | Status: AC
  Administered 2022-07-30: 25 mg via INTRAVENOUS
  Filled 2022-07-30: qty 1

## 2022-07-30 MED ORDER — METOCLOPRAMIDE HCL 5 MG/ML IJ SOLN
10.0000 mg | Freq: Once | INTRAMUSCULAR | Status: AC
Start: 2022-07-30 — End: 2022-07-30
  Administered 2022-07-30: 10 mg via INTRAVENOUS
  Filled 2022-07-30: qty 2

## 2022-07-30 NOTE — ED Triage Notes (Signed)
Pt presents with complaint of HA x 4 days. Taking OTC meds . Light sensitivity. Feels like jaw clenching. Ambulatory with cane due to previous stroke

## 2022-07-30 NOTE — Discharge Instructions (Addendum)
You were evaluated in the ED today for headache.  Your overall work-up was negative, there was no evidence of a hemorrhage or clot in the brain.  Recommend close follow-up with your PCP within the next 2-3 days.  Also, please follow-up with cardiology as recommended prior within the next week.  Further information regarding headaches, management, treatment, and warning signs that we discussed have been provided for you to review.    You are also recommended to follow-up with neurology with restarting of your headaches within the next week.  Your neurologist's contact information has been provided for you.  You may continue to utilize ibuprofen or Aleve, however not both these at the same time.  Then you may also add on Tylenol and or Benadryl on top of this for management of a bad headache.  Maximum ibuprofen dose per day is 800 mg every 6 hours.  Maximum Aleve dose per day is two 220 mg tablets every 12 hours.  Maximum Tylenol dose per day is 1000 mg every 6 hours.  Please keep in mind Benadryl is a drowsy medication, do not drive or operate machinery after using this medication for at least 4-6 hours.  Return to the ED for any new or worsening symptoms as discussed.

## 2022-07-30 NOTE — ED Notes (Signed)
Patient transported to CT 

## 2022-07-30 NOTE — ED Provider Notes (Signed)
[place] EMERGENCY DEPARTMENT Provider Note   CSN: [id number] Arrival date & time: 07/30/22  6203     History  Chief Complaint  Patient presents with   Headache    Margaret Hess is a 56 y.o. female with Hx of CHF, migraines, prior CVA (embolic) with residual left-sided weakness, CHF, HTN, uncontrolled DMT2 with neuropathy, bipolar, anxiety, nonischemic cardiomyopathy, chronic interstitial cystitis with recurrent UTIs with prior use of chronic antibiotic suppression, presenting today with chief complaint of headache.  Headache described as "everywhere" on the head with light sensitivity.  Has remained constant over the last 4 days.  Also had a headache about a month ago, patient states she was seen at [facility] and was told "it is likely related to the stroke".  Patient states she was not told to go anywhere else or seek further assessment/care.  She has tried several over-the-counter remedies including NSAIDs and Tylenol and "something with caffeine" with no relief.  Denies new numbness, weakness, or neurodeficits.  States she is lingering left-sided weakness of arm and leg from prior CVA 10 years ago.  Also states she gets occasional left-sided facial droop when "I do not sleep well".  Denies dizziness, lightheadedness, N/V/D, fevers, chest pain, shortness of breath, abdominal pain, urinary symptoms.  Also reports "stomach discomfort with eating/food".  Was previously on chronic antibiotics due to chronic interstitial cystitis with recurrent UTIs, however a month ago was taken off of antibiotic therapy, though patient states she is unsure of why.  Per record review, seen by UC on 07/04/2022 for headache as well.  Had some vomiting, tried Benadryl without relief.  Has been overall without headache since stroke 10 years ago.  Walks with cane.  Was recommended to go to the emergency room for further evaluation however patient declined.  The history is provided by the patient and medical  records.  Headache      Home Medications Prior to Admission medications   Medication Sig Start Date End Date Taking? Authorizing Provider  acetaminophen (TYLENOL) 500 MG tablet Take 1,000 mg by mouth every 6 (six) hours as needed for pain.     [provider]  ALPRAZolam Duanne Moron) 0.5 MG tablet Take 0.5 mg by mouth at bedtime. 11/01/19   [provider]  ARIPiprazole (ABILIFY) 2 MG tablet Take 2 mg by mouth at bedtime. 11/01/19   [provider]  aspirin EC 81 MG tablet Take 1 tablet (81 mg total) by mouth daily. 03/25/14   Larey Dresser, MD  atorvastatin (LIPITOR) 20 MG tablet Take 20 mg by mouth daily. 02/15/22   [provider]  blood glucose meter kit and supplies KIT Dispense based on patient and insurance preference. Use up to four times daily as directed. 02/04/22   Little Ishikawa, MD  carvedilol (COREG) 6.25 MG tablet Take 1 tablet (6.25 mg total) by mouth 2 (two) times daily with a meal. 02/04/22   Little Ishikawa, MD  cephALEXin (KEFLEX) 250 MG capsule Take 250 mg by mouth daily. 02/13/22   [provider]  clobetasol cream (TEMOVATE) 5.59 % Apply 1 application. topically 2 (two) times daily. 04/14/19   [provider]  DULoxetine (CYMBALTA) 60 MG capsule Take 60 mg by mouth 2 (two) times daily. 11/01/19   [provider]  furosemide (LASIX) 20 MG tablet Take 1 tablet (20 mg total) by mouth daily. 04/22/22   Richardo Priest, MD  HYDROcodone-acetaminophen (NORCO/VICODIN) 5-325 MG tablet Take 1 tablet by mouth every  8 (eight) hours as needed for pain. 01/03/20   [provider]  LANTUS SOLOSTAR 100 UNIT/ML Solostar Pen Inject 20 Units into the skin at bedtime. 02/04/22   Little Ishikawa, MD  linagliptin (TRADJENTA) 5 MG TABS tablet Take 1 tablet (5 mg total) by mouth daily. 02/05/22   Little Ishikawa, MD  metFORMIN (GLUCOPHAGE-XR) 500 MG 24 hr tablet Take 1,000 mg by mouth 2 (two) times daily. 01/03/20   [provider]  mirabegron ER (MYRBETRIQ) 50 MG TB24 tablet Take 50 mg by mouth daily. 11/09/19   [provider]  naloxone [facility] nasal spray 4 mg/0.1 mL For suspected opioid overdose, spray 1 mL in each nostril.  Repeat after 3 minutes if no or minimal response. 02/02/21   [provider]  nitrofurantoin (MACRODANTIN) 50 MG capsule Take 50 mg by mouth daily. 12/09/21   [provider]  omeprazole (PRILOSEC) 40 MG capsule Take 40 mg by mouth daily. 04/11/21   [provider]  ondansetron (ZOFRAN-ODT) 4 MG disintegrating tablet Take 4 mg by mouth every 8 (eight) hours as needed for vomiting or nausea. 04/09/21   [provider]  oxybutynin (DITROPAN) 5 MG tablet Take 1 tablet by mouth daily. 11/07/21   [provider]  sacubitril-valsartan (ENTRESTO) 24-26 MG TAKE 1 TABLET BY MOUTH TWICE DAILY 04/09/22   Richardo Priest, MD  TOVIAZ 4 MG TB24 tablet Take 4 mg by mouth daily. 04/05/21   [provider]      Allergies    Penicillins    Review of Systems   Review of Systems  Neurological:  Positive for headaches.    Physical Exam Updated Vital Signs BP 137/82   Pulse 94   Temp 98.8 F (37.1 C) (Oral)   Resp 17   Wt 77.1 kg   LMP 02/01/2016   SpO2 97%   BMI 27.44 kg/m  Physical Exam Vitals and nursing note reviewed.  Constitutional:      General: She is not in acute distress.    Appearance: She is well-developed. She is not ill-appearing, toxic-appearing or diaphoretic.  HENT:     Head: Normocephalic and atraumatic.  Eyes:     General: No visual field deficit or scleral icterus.    Extraocular Movements: Extraocular movements intact.     Conjunctiva/sclera: Conjunctivae normal.     Pupils: Pupils are equal, round, and reactive to light.  Cardiovascular:     Rate and Rhythm: Normal rate and regular rhythm.     Heart sounds: Normal heart sounds. No murmur heard. Pulmonary:     Effort: Pulmonary effort is normal. No respiratory  distress.     Breath sounds: Normal breath sounds.  Chest:     Chest wall: No tenderness.  Abdominal:     General: There is no distension.     Palpations: Abdomen is soft.     Tenderness: There is no abdominal tenderness. There is no guarding.  Musculoskeletal:        General: No swelling.     Cervical back: Neck supple. No rigidity.  Skin:    General: Skin is warm and dry.     Capillary Refill: Capillary refill takes less than 2 seconds.     Coloration: Skin is not cyanotic or pale.  Neurological:     Mental Status: She is alert and oriented to person, place, and time. Mental status is at baseline.     GCS: GCS eye subscore is 4. GCS verbal subscore is  5. GCS motor subscore is 6.     Sensory: No sensory deficit.     Motor: Weakness (Pre-existing weakness of the left upper extremity and left lower extremity, upper worse than lower) present.     Coordination: Present: Due to difficulty with ambulation at baseline, opted to defer Romberg test at this time.. Coordination abnormal (Of the left upper and left lower extremity at baseline.  Right upper and lower extremity appears intact).     Gait: Gait abnormal (Difficulty with ambulation at baseline, uses cane for assistance.).     Comments: Slowed speech at baseline.  Left-sided facial asymmetry appreciated.  No tremor.  Unable to participate with pronator drift due to pre-existing left-sided upper extremity deficits.  No seizure activity.  Normal finger-to-nose and heel-to-shin of right upper and lower extremity, unable to assess/participate due to pre-existing left-sided extremity deficits.  Though with current participation, patient reports no new changes/weakness/deficit of left extremities.  Psychiatric:        Mood and Affect: Mood normal.     ED Results / Procedures / Treatments   Labs (all labs ordered are listed, but only abnormal results are displayed) Labs Reviewed  BASIC METABOLIC PANEL - Abnormal; Notable for the following  components:      Result Value   Potassium 3.4 (*)    Chloride 95 (*)    Glucose, Bld 247 (*)    BUN 24 (*)    Anion gap 16 (*)    All other components within normal limits  CBC WITH DIFFERENTIAL/PLATELET - Abnormal; Notable for the following components:   Hemoglobin 11.1 (*)    HCT 34.3 (*)    All other components within normal limits    EKG None  Radiology CT Head Wo Contrast  Result Date: 07/30/2022 CLINICAL DATA:  Headache, new onset (Age >= 51y) EXAM: CT HEAD WITHOUT CONTRAST TECHNIQUE: Contiguous axial images were obtained from the base of the skull through the vertex without intravenous contrast. RADIATION DOSE REDUCTION: This exam was performed according to the departmental dose-optimization program which includes automated exposure control, adjustment of the mA and/or kV according to patient size and/or use of iterative reconstruction technique. COMPARISON:  03/04/2014 FINDINGS: Brain: There is periventricular white matter decreased attenuation consistent with small vessel ischemic changes. Ventricles, sulci and cisterns are prominent consistent with age related involutional changes. No acute intracranial hemorrhage, mass effect or shift. No hydrocephalus. Encephalomalacia identified consistent with a chronic large right MCA CVA. There is compensatory dilatation of the right lateral ventricle. Vascular: No hyperdense vessel or unexpected calcification. Skull: Normal. Negative for fracture or focal lesion. Sinuses/Orbits: No acute finding. IMPRESSION: 1. Atrophy and chronic small vessel ischemic changes. 2. Chronic right MCA CVA. 3. No acute intracranial process identified. Electronically Signed   By: Sammie Bench M.D.   On: 07/30/2022 10:10    Procedures Procedures    Medications Ordered in ED Medications  metoCLOPramide (REGLAN) injection 10 mg (10 mg Intravenous Given 07/30/22 0947)  diphenhydrAMINE (BENADRYL) injection 25 mg (25 mg Intravenous Given 07/30/22 0945)   sodium chloride 0.9 % bolus 500 mL (0 mLs Intravenous Stopped 07/30/22 1100)    ED Course/ Medical Decision Making/ A&P                           Medical Decision Making Amount and/or Complexity of Data Reviewed Labs: ordered. Radiology: ordered.  Risk Prescription drug management.   56 y.o. female presents to the ED for concern  of Headache     This involves an extensive number of treatment options, and is a complaint that carries with it a high risk of complications and morbidity.     Past Medical History / Co-morbidities / Social History: Hx of CHF, migraines, prior CVA (embolic) with residual left-sided weakness, CHF, HTN, uncontrolled DMT2 with neuropathy, bipolar, anxiety, nonischemic cardiomyopathy, chronic interstitial cystitis with recurrent UTIs with prior use of chronic antibiotic suppression Social Determinants of Health include: Noncompliance, for which counseling was provided  Additional History:  Obtained by chart review.  Notably recent cardiology visit from 04/10/2022.  Specifically "Is a very complex difficult case she has had a profound decrease in left ventricular function and recurrent decompensated heart failure since last seen by me over 2 years ago.  I think she needs to be seen again by advanced heart failure I think she is on maximally tolerated medical therapy with blood pressure of 914 systolic she will continue her loop diuretic or beta-blocker low-dose Entresto she is not on MRA I will check an renal function today and unless she has a decreased GFR I will initiate it and I think she needs to get back to be seen in the advanced heart failure program.  I would start vericiquat today except for her borderline blood pressure... do not have a up-to-date list of medications potentially but I am going to go ahead and stop the 3 concerning agents Diflucan amitriptyline and Celexa that she had been taken we will apply a ZIO monitor to see if she is having ventricular  tachycardia and will bring her back to the office to do an EKG in 1 week.  EKG today her QT interval is normal 472 she is unsure of the medications she is taking and I am going to stop those drugs regardless... see her back in our office in 6 weeks to be sure that she is having follow-up".  No recent cardiology visit appreciated.  Lab Tests: I ordered, and personally interpreted labs.  The pertinent results include:   Mild anemia, appears stable with improvement from baseline Glucose 247, creatinine 0.93 BUN 24.  Without evidence of AKI.  Very mild hypokalemia 3.4 and hypochloremia 9.5.  Imaging Studies: I ordered imaging studies including CT head.   I independently visualized and interpreted imaging which showed no evidence of acute intracranial abnormality. I agree with the radiologist interpretation.  Cardiac Monitoring: The patient was maintained on a cardiac monitor.  I personally viewed and interpreted the cardiac monitored which showed an underlying rhythm of: NSR  ED Course / Critical Interventions: Pt overall well-appearing on exam.  Presenting with headache of 4 days.  Headache encompasses the entire head, does not radiate.  No recent injury/trauma.  Afebrile.  Nontoxic, nonseptic appearing though with mild tachycardia.  With significantly complex history, notable for prior CVA (embolic) about 10 years ago and severe CHF.  Significant left-sided lingering deficits of left upper and lower extremity, upper worse than lower.  Also with reported intermittent left-sided facial droop when deprived of sleep.  Mild left-sided facial droop appreciated on exam, however patient states this is due to her lack of sleep.  Difficult to assess for new deficits of the left side, however right side appears to have coordination, strength, range of motion, and sensation grossly intact.  However, pt repeats she believes she is overall at her neurologic baseline.  Pulses strong of all extremities.  No  lightheadedness or dizziness.  Mild photophobia reported.  PERRLA.  Normal EOMs.  Without eye pain or temporal tenderness.  Doubt acute angle-closure glaucoma or giant cell arteritis.  Without chest pain, shortness of breath, or abdominal pain.  Abdomen appears nonperitoneal.  Plan to proceed with CT imaging of the head and migraine cocktail with gentle hydration.  Plan to reassess. CT imaging negative for acute intracranial abnormality.  Noted chronic conditions appear unchanged.  Labs unremarkable.  Anemia appears mild and stable. Upon reevaluation, patient reports complete resolution of headache with migraine cocktail, gentle fluids, and rest.  Patient states she usually gets headaches when she is dehydrated.  States she feels much better and would like to go home.  Repeat neuroexam without significant changes.  Shared decision making with patient.  I find this reasonable with close PCP follow-up.  Also recommend close follow-up with neurology, she is already established with neurology and resources/contact information re-provided.  Also noted from recent cardiology notes, patient was supposed to follow-up 6 weeks after her appointment.  Recommended following back up with cardiology due to extensive history.  Patient demonstrates understanding.  Strict return precautions discussed.  Patient reports satisfaction with today's encounter.  Patient in NAD and good condition at time of discharge.  Disposition: After consideration the patient's encounter today, I do not feel today's workup suggests an emergent condition requiring admission or immediate intervention beyond what has been performed at this time.  Safe for discharge; instructed to return immediately for worsening symptoms, change in symptoms or any other concerns.  I have reviewed the patients home medicines and have made adjustments as needed.  Discussed course of treatment with the patient, whom demonstrated understanding.  Patient in agreement and  has no further questions.    I discussed this case with my attending, Dr. Pearline Cables, who agreed with the proposed treatment course and cosigned this note.  Attending physician stated agreement with plan or made changes to plan which were implemented.    This chart was dictated using voice recognition software.  Despite best efforts to proofread, errors can occur which can change the documentation meaning.         Final Clinical Impression(s) / ED Diagnoses Final diagnoses:  Primary hypertension  Bad headache  History of migraine    Rx / DC Orders ED Discharge Orders          Ordered    Ambulatory referral to Cardiology       Comments: If you have not heard from the Cardiology office within the next 72 hours please call [phone].   07/30/22 1149    Ambulatory referral to Neurology       Comments: An appointment is requested in approximately: 1 week   07/30/22 [place], [place], [place] [birth year] 2351    Wynona Dove A, DO 08/06/22 1418

## 2022-07-30 NOTE — ED Notes (Signed)
Pt with history of stroke. Left sided hemiplegia. Let hand contracted. Noted to have brace to left leg. Utilizes cane to ambulate

## 2022-08-05 ENCOUNTER — Encounter (HOSPITAL_BASED_OUTPATIENT_CLINIC_OR_DEPARTMENT_OTHER): Payer: Self-pay

## 2022-08-05 ENCOUNTER — Emergency Department (HOSPITAL_BASED_OUTPATIENT_CLINIC_OR_DEPARTMENT_OTHER)
Admission: EM | Admit: 2022-08-05 | Discharge: 2022-08-05 | Discharge status: Against Medical Advice | Payer: Medicare PPO | Attending: Emergency Medicine | Admitting: Emergency Medicine

## 2022-08-05 ENCOUNTER — Other Ambulatory Visit: Payer: Self-pay

## 2022-08-05 DIAGNOSIS — R110 Nausea: Secondary | ICD-10-CM | POA: Insufficient documentation

## 2022-08-05 DIAGNOSIS — Z5321 Procedure and treatment not carried out due to patient leaving prior to being seen by health care provider: Secondary | ICD-10-CM | POA: Insufficient documentation

## 2022-08-05 DIAGNOSIS — R519 Headache, unspecified: Secondary | ICD-10-CM | POA: Insufficient documentation

## 2022-08-05 MED ORDER — SODIUM CHLORIDE 0.9 % IV SOLN: INTRAVENOUS | Status: DC

## 2022-08-05 MED ORDER — DIPHENHYDRAMINE HCL 50 MG/ML IJ SOLN: 12.5000 mg | Freq: Once | INTRAMUSCULAR | Status: DC

## 2022-08-05 MED ORDER — SODIUM CHLORIDE 0.9 % IV BOLUS: 1000.0000 mL | Freq: Once | INTRAVENOUS | Status: DC

## 2022-08-05 MED ORDER — ACETAMINOPHEN 500 MG PO TABS: 1000.0000 mg | ORAL_TABLET | Freq: Once | ORAL | Status: DC

## 2022-08-05 MED ORDER — METOCLOPRAMIDE HCL 5 MG/ML IJ SOLN: 10.0000 mg | Freq: Once | INTRAMUSCULAR | Status: DC

## 2022-08-05 NOTE — ED Notes (Signed)
Called for vitals in the waiting room but no answer

## 2022-08-05 NOTE — ED Triage Notes (Signed)
Pt c/o migraine HA "a long time, a week plus." Photosensitivity, nausea associated. Hx migraines, states it feels similar but no relief

## 2022-08-14 ENCOUNTER — Ambulatory Visit: Payer: Medicare PPO | Attending: Cardiology | Admitting: Cardiology

## 2022-09-05 ENCOUNTER — Telehealth (HOSPITAL_COMMUNITY): Payer: Self-pay | Admitting: Surgery

## 2022-09-05 NOTE — Telephone Encounter (Signed)
I called patient in reference to referral to the [facility].  I left a message requesting that she return call to schedule her appt.

## 2022-12-04 ENCOUNTER — Other Ambulatory Visit: Payer: Self-pay

## 2022-12-04 ENCOUNTER — Telehealth: Payer: Self-pay | Admitting: Cardiology

## 2022-12-04 NOTE — Telephone Encounter (Signed)
Pt c/o medication issue:  1. Name of Medication:  carvedilol (COREG) 3.125 MG tablet sacubitril-valsartan (ENTRESTO) 24-26 MG  2. How are you currently taking this medication (dosage and times per day)?   3. Are you having a reaction (difficulty breathing--STAT)?   4. What is your medication issue?   Stephanie with [employer] would like to clarify instructions for Carvedilol and confirm whether patient should still be taking Entresto. Please advise.

## 2022-12-04 NOTE — Telephone Encounter (Signed)
Called Colletta Maryland at [facility] and clarified the instructions for carvedilol and also confirmed that the patient was still taking Entresto according to our records in her chart. Colletta Maryland was appreciative for the call and had no further questions at this time.

## 2022-12-06 ENCOUNTER — Telehealth: Payer: Self-pay

## 2022-12-06 NOTE — Telephone Encounter (Signed)
Received a call from the medical examiner stating that the patient has passed away and they would like for Dr. Dulce Sellar to sign the death certificate. I explained that Dr. Dulce Sellar was sick today and he would be out of the office until next Tuesday. I asked her if the PCP could sign the death certificate and she stated that the patient did not have one. She stated that she would have to contact [facility] to see what they wanted her to do in this case. The medical examiner had no further questions at this time.
